# Patient Record
Sex: Female | Born: 1935 | Race: White | Hispanic: No | Smoking: Never smoker
Health system: Southern US, Community
[De-identification: ages and names within clinical notes are randomized; demographics above are authoritative.]

## PROBLEM LIST (undated history)

## (undated) DIAGNOSIS — R51 Headache: Secondary | ICD-10-CM

## (undated) DIAGNOSIS — E039 Hypothyroidism, unspecified: Secondary | ICD-10-CM

## (undated) DIAGNOSIS — R519 Headache, unspecified: Secondary | ICD-10-CM

## (undated) DIAGNOSIS — K219 Gastro-esophageal reflux disease without esophagitis: Secondary | ICD-10-CM

## (undated) DIAGNOSIS — J449 Chronic obstructive pulmonary disease, unspecified: Secondary | ICD-10-CM

## (undated) DIAGNOSIS — H9190 Unspecified hearing loss, unspecified ear: Secondary | ICD-10-CM

## (undated) DIAGNOSIS — F32A Depression, unspecified: Secondary | ICD-10-CM

## (undated) DIAGNOSIS — R1310 Dysphagia, unspecified: Secondary | ICD-10-CM

## (undated) DIAGNOSIS — IMO0001 Reserved for inherently not codable concepts without codable children: Secondary | ICD-10-CM

## (undated) DIAGNOSIS — I10 Essential (primary) hypertension: Secondary | ICD-10-CM

## (undated) DIAGNOSIS — F329 Major depressive disorder, single episode, unspecified: Secondary | ICD-10-CM

## (undated) DIAGNOSIS — H269 Unspecified cataract: Secondary | ICD-10-CM

## (undated) HISTORY — DX: Headache, unspecified: R51.9

## (undated) HISTORY — PX: APPENDECTOMY: SHX54

## (undated) HISTORY — DX: Gastro-esophageal reflux disease without esophagitis: K21.9

## (undated) HISTORY — DX: Unspecified hearing loss, unspecified ear: H91.90

## (undated) HISTORY — PX: LUMBAR LAMINECTOMY: SHX95

## (undated) HISTORY — DX: Dysphagia, unspecified: R13.10

## (undated) HISTORY — DX: Chronic obstructive pulmonary disease, unspecified: J44.9

## (undated) HISTORY — DX: Major depressive disorder, single episode, unspecified: F32.9

## (undated) HISTORY — DX: Reserved for inherently not codable concepts without codable children: IMO0001

## (undated) HISTORY — PX: PARATHYROIDECTOMY: SHX19

## (undated) HISTORY — PX: TOTAL HIP ARTHROPLASTY: SHX124

## (undated) HISTORY — DX: Depression, unspecified: F32.A

## (undated) HISTORY — DX: Headache: R51

## (undated) HISTORY — PX: ABDOMINAL HYSTERECTOMY: SUR658

## (undated) HISTORY — DX: Essential (primary) hypertension: I10

## (undated) HISTORY — DX: Unspecified cataract: H26.9

## (undated) HISTORY — DX: Hypothyroidism, unspecified: E03.9

---

## 2006-04-16 ENCOUNTER — Ambulatory Visit: Payer: Self-pay | Admitting: Pain Medicine

## 2006-04-23 ENCOUNTER — Ambulatory Visit: Payer: Self-pay | Admitting: Pain Medicine

## 2006-05-12 ENCOUNTER — Ambulatory Visit: Payer: Self-pay | Admitting: Pain Medicine

## 2006-06-12 ENCOUNTER — Ambulatory Visit: Payer: Self-pay | Admitting: Pain Medicine

## 2006-06-23 ENCOUNTER — Ambulatory Visit: Payer: Self-pay | Admitting: Pain Medicine

## 2006-08-05 ENCOUNTER — Ambulatory Visit: Payer: Self-pay | Admitting: Pain Medicine

## 2006-08-11 ENCOUNTER — Ambulatory Visit: Payer: Self-pay | Admitting: Pain Medicine

## 2006-09-17 ENCOUNTER — Encounter: Payer: Self-pay | Admitting: Neurology

## 2006-10-02 ENCOUNTER — Ambulatory Visit: Payer: Self-pay | Admitting: Pain Medicine

## 2006-10-08 ENCOUNTER — Encounter: Payer: Self-pay | Admitting: Neurology

## 2006-10-13 ENCOUNTER — Ambulatory Visit: Payer: Self-pay | Admitting: Pain Medicine

## 2006-11-25 ENCOUNTER — Ambulatory Visit: Payer: Self-pay | Admitting: Pain Medicine

## 2006-12-01 ENCOUNTER — Ambulatory Visit: Payer: Self-pay | Admitting: Pain Medicine

## 2006-12-25 ENCOUNTER — Ambulatory Visit: Payer: Self-pay | Admitting: Pain Medicine

## 2007-05-11 ENCOUNTER — Ambulatory Visit: Payer: Self-pay | Admitting: Pain Medicine

## 2007-06-23 ENCOUNTER — Ambulatory Visit: Payer: Self-pay | Admitting: Pain Medicine

## 2007-07-20 ENCOUNTER — Ambulatory Visit: Payer: Self-pay | Admitting: Pain Medicine

## 2007-08-20 ENCOUNTER — Ambulatory Visit: Payer: Self-pay | Admitting: Pain Medicine

## 2007-08-26 ENCOUNTER — Ambulatory Visit: Payer: Self-pay | Admitting: Pain Medicine

## 2007-09-16 ENCOUNTER — Ambulatory Visit: Payer: Self-pay | Admitting: Pain Medicine

## 2007-10-13 ENCOUNTER — Ambulatory Visit: Payer: Self-pay | Admitting: Pain Medicine

## 2007-11-19 ENCOUNTER — Ambulatory Visit: Payer: Self-pay | Admitting: Pain Medicine

## 2008-01-21 ENCOUNTER — Ambulatory Visit: Payer: Self-pay | Admitting: Pain Medicine

## 2008-02-11 ENCOUNTER — Ambulatory Visit: Payer: Self-pay | Admitting: Pain Medicine

## 2008-05-11 ENCOUNTER — Ambulatory Visit: Payer: Self-pay | Admitting: Pain Medicine

## 2008-06-14 ENCOUNTER — Ambulatory Visit: Payer: Self-pay | Admitting: Pain Medicine

## 2008-06-29 ENCOUNTER — Ambulatory Visit: Payer: Self-pay | Admitting: Pain Medicine

## 2008-07-27 ENCOUNTER — Ambulatory Visit: Payer: Self-pay | Admitting: Pain Medicine

## 2008-08-08 ENCOUNTER — Ambulatory Visit: Payer: Self-pay | Admitting: Pain Medicine

## 2008-09-15 ENCOUNTER — Ambulatory Visit: Payer: Self-pay | Admitting: Pain Medicine

## 2008-11-23 ENCOUNTER — Encounter: Payer: Self-pay | Admitting: Otolaryngology

## 2008-11-25 ENCOUNTER — Ambulatory Visit: Payer: Self-pay | Admitting: Otolaryngology

## 2008-12-07 ENCOUNTER — Encounter: Payer: Self-pay | Admitting: Otolaryngology

## 2008-12-12 ENCOUNTER — Ambulatory Visit: Payer: Self-pay | Admitting: Pain Medicine

## 2008-12-13 ENCOUNTER — Ambulatory Visit: Payer: Self-pay | Admitting: Family Medicine

## 2008-12-16 ENCOUNTER — Other Ambulatory Visit: Payer: Self-pay | Admitting: Psychiatry

## 2009-01-10 ENCOUNTER — Ambulatory Visit: Payer: Self-pay | Admitting: Pain Medicine

## 2009-01-11 ENCOUNTER — Encounter: Payer: Self-pay | Admitting: Otolaryngology

## 2009-01-23 ENCOUNTER — Ambulatory Visit: Payer: Self-pay | Admitting: Pain Medicine

## 2009-02-09 ENCOUNTER — Ambulatory Visit: Payer: Self-pay | Admitting: Ophthalmology

## 2009-02-21 ENCOUNTER — Encounter: Payer: Self-pay | Admitting: Otolaryngology

## 2009-02-23 ENCOUNTER — Ambulatory Visit: Payer: Self-pay | Admitting: Pain Medicine

## 2009-02-23 ENCOUNTER — Emergency Department: Payer: Self-pay | Admitting: Emergency Medicine

## 2009-03-14 ENCOUNTER — Encounter: Payer: Self-pay | Admitting: Otolaryngology

## 2009-03-23 ENCOUNTER — Ambulatory Visit: Payer: Self-pay | Admitting: Pain Medicine

## 2009-03-29 ENCOUNTER — Ambulatory Visit: Payer: Self-pay | Admitting: Pain Medicine

## 2009-04-04 ENCOUNTER — Ambulatory Visit: Payer: Self-pay | Admitting: Ophthalmology

## 2009-04-10 ENCOUNTER — Encounter: Payer: Self-pay | Admitting: Otolaryngology

## 2009-04-18 ENCOUNTER — Ambulatory Visit: Payer: Self-pay | Admitting: Pain Medicine

## 2009-04-20 ENCOUNTER — Ambulatory Visit: Payer: Self-pay | Admitting: Family Medicine

## 2009-05-24 ENCOUNTER — Ambulatory Visit: Payer: Self-pay | Admitting: Pain Medicine

## 2009-06-13 ENCOUNTER — Ambulatory Visit: Payer: Self-pay | Admitting: Gastroenterology

## 2009-06-14 ENCOUNTER — Ambulatory Visit: Payer: Self-pay | Admitting: Pain Medicine

## 2009-08-08 ENCOUNTER — Ambulatory Visit: Payer: Self-pay | Admitting: Pain Medicine

## 2009-08-14 ENCOUNTER — Ambulatory Visit: Payer: Self-pay | Admitting: Pain Medicine

## 2009-09-14 ENCOUNTER — Ambulatory Visit: Payer: Self-pay | Admitting: Pain Medicine

## 2009-09-27 ENCOUNTER — Ambulatory Visit: Payer: Self-pay | Admitting: Pain Medicine

## 2009-10-25 ENCOUNTER — Ambulatory Visit: Payer: Self-pay | Admitting: Family Medicine

## 2009-10-31 ENCOUNTER — Ambulatory Visit: Payer: Self-pay | Admitting: Pain Medicine

## 2009-11-08 ENCOUNTER — Ambulatory Visit: Payer: Self-pay | Admitting: Pain Medicine

## 2009-12-12 ENCOUNTER — Ambulatory Visit: Payer: Self-pay | Admitting: Pain Medicine

## 2010-01-03 ENCOUNTER — Ambulatory Visit: Payer: Self-pay | Admitting: Pain Medicine

## 2010-01-15 ENCOUNTER — Ambulatory Visit: Payer: Self-pay | Admitting: Pain Medicine

## 2010-02-15 ENCOUNTER — Ambulatory Visit: Payer: Self-pay | Admitting: Pain Medicine

## 2010-02-26 ENCOUNTER — Ambulatory Visit: Payer: Self-pay | Admitting: Pain Medicine

## 2010-03-13 ENCOUNTER — Ambulatory Visit: Payer: Self-pay | Admitting: Ophthalmology

## 2010-03-29 ENCOUNTER — Ambulatory Visit: Payer: Self-pay | Admitting: Pain Medicine

## 2010-04-11 ENCOUNTER — Ambulatory Visit: Payer: Self-pay | Admitting: Pain Medicine

## 2010-04-26 ENCOUNTER — Ambulatory Visit: Payer: Self-pay | Admitting: Pain Medicine

## 2010-05-07 ENCOUNTER — Ambulatory Visit: Payer: Self-pay | Admitting: Pain Medicine

## 2010-05-24 ENCOUNTER — Ambulatory Visit: Payer: Self-pay | Admitting: Pain Medicine

## 2010-05-28 ENCOUNTER — Ambulatory Visit: Payer: Self-pay | Admitting: Pain Medicine

## 2010-06-06 ENCOUNTER — Emergency Department: Payer: Self-pay | Admitting: Emergency Medicine

## 2010-06-07 ENCOUNTER — Ambulatory Visit: Payer: Self-pay | Admitting: Pain Medicine

## 2010-06-07 ENCOUNTER — Emergency Department: Payer: Self-pay | Admitting: Internal Medicine

## 2010-06-12 ENCOUNTER — Ambulatory Visit: Payer: Self-pay | Admitting: Unknown Physician Specialty

## 2010-06-18 ENCOUNTER — Ambulatory Visit: Payer: Self-pay | Admitting: Pain Medicine

## 2010-07-19 ENCOUNTER — Ambulatory Visit: Payer: Self-pay | Admitting: Pain Medicine

## 2010-07-25 ENCOUNTER — Ambulatory Visit: Payer: Self-pay | Admitting: Pain Medicine

## 2010-08-16 ENCOUNTER — Ambulatory Visit: Payer: Self-pay | Admitting: Pain Medicine

## 2010-08-20 DIAGNOSIS — C44310 Basal cell carcinoma of skin of unspecified parts of face: Secondary | ICD-10-CM | POA: Insufficient documentation

## 2010-08-27 ENCOUNTER — Ambulatory Visit: Payer: Self-pay | Admitting: Pain Medicine

## 2010-09-11 ENCOUNTER — Ambulatory Visit: Payer: Self-pay | Admitting: Unknown Physician Specialty

## 2010-09-13 ENCOUNTER — Ambulatory Visit: Payer: Self-pay | Admitting: Unknown Physician Specialty

## 2010-09-24 ENCOUNTER — Ambulatory Visit: Payer: Self-pay | Admitting: Surgery

## 2010-09-27 ENCOUNTER — Emergency Department: Payer: Self-pay | Admitting: Emergency Medicine

## 2010-10-03 ENCOUNTER — Ambulatory Visit: Payer: Self-pay | Admitting: Pain Medicine

## 2010-10-16 ENCOUNTER — Ambulatory Visit: Payer: Self-pay | Admitting: Pain Medicine

## 2010-10-24 ENCOUNTER — Ambulatory Visit: Payer: Self-pay | Admitting: Pain Medicine

## 2010-11-22 ENCOUNTER — Ambulatory Visit: Payer: Self-pay | Admitting: Pain Medicine

## 2010-12-05 ENCOUNTER — Ambulatory Visit: Payer: Self-pay | Admitting: Pain Medicine

## 2010-12-25 ENCOUNTER — Ambulatory Visit: Payer: Self-pay | Admitting: Pain Medicine

## 2011-01-10 DIAGNOSIS — M545 Low back pain, unspecified: Secondary | ICD-10-CM | POA: Diagnosis not present

## 2011-01-10 DIAGNOSIS — M6281 Muscle weakness (generalized): Secondary | ICD-10-CM | POA: Diagnosis not present

## 2011-01-10 DIAGNOSIS — R262 Difficulty in walking, not elsewhere classified: Secondary | ICD-10-CM | POA: Diagnosis not present

## 2011-01-15 DIAGNOSIS — M545 Low back pain, unspecified: Secondary | ICD-10-CM | POA: Diagnosis not present

## 2011-01-15 DIAGNOSIS — M6281 Muscle weakness (generalized): Secondary | ICD-10-CM | POA: Diagnosis not present

## 2011-01-15 DIAGNOSIS — R262 Difficulty in walking, not elsewhere classified: Secondary | ICD-10-CM | POA: Diagnosis not present

## 2011-01-16 ENCOUNTER — Ambulatory Visit: Payer: Self-pay | Admitting: Pain Medicine

## 2011-01-16 DIAGNOSIS — M4120 Other idiopathic scoliosis, site unspecified: Secondary | ICD-10-CM | POA: Diagnosis not present

## 2011-01-16 DIAGNOSIS — M62838 Other muscle spasm: Secondary | ICD-10-CM | POA: Diagnosis not present

## 2011-01-16 DIAGNOSIS — M5137 Other intervertebral disc degeneration, lumbosacral region: Secondary | ICD-10-CM | POA: Diagnosis not present

## 2011-01-16 DIAGNOSIS — Z79899 Other long term (current) drug therapy: Secondary | ICD-10-CM | POA: Diagnosis not present

## 2011-01-16 DIAGNOSIS — G5700 Lesion of sciatic nerve, unspecified lower limb: Secondary | ICD-10-CM | POA: Diagnosis not present

## 2011-01-16 DIAGNOSIS — IMO0002 Reserved for concepts with insufficient information to code with codable children: Secondary | ICD-10-CM | POA: Diagnosis not present

## 2011-01-16 DIAGNOSIS — Z9889 Other specified postprocedural states: Secondary | ICD-10-CM | POA: Diagnosis not present

## 2011-01-16 DIAGNOSIS — M4800 Spinal stenosis, site unspecified: Secondary | ICD-10-CM | POA: Diagnosis not present

## 2011-01-16 DIAGNOSIS — M79609 Pain in unspecified limb: Secondary | ICD-10-CM | POA: Diagnosis not present

## 2011-01-16 DIAGNOSIS — F329 Major depressive disorder, single episode, unspecified: Secondary | ICD-10-CM | POA: Diagnosis not present

## 2011-01-16 DIAGNOSIS — S32009A Unspecified fracture of unspecified lumbar vertebra, initial encounter for closed fracture: Secondary | ICD-10-CM | POA: Diagnosis not present

## 2011-01-16 DIAGNOSIS — M545 Low back pain, unspecified: Secondary | ICD-10-CM | POA: Diagnosis not present

## 2011-01-16 DIAGNOSIS — E039 Hypothyroidism, unspecified: Secondary | ICD-10-CM | POA: Diagnosis not present

## 2011-01-17 DIAGNOSIS — M6281 Muscle weakness (generalized): Secondary | ICD-10-CM | POA: Diagnosis not present

## 2011-01-17 DIAGNOSIS — R262 Difficulty in walking, not elsewhere classified: Secondary | ICD-10-CM | POA: Diagnosis not present

## 2011-01-17 DIAGNOSIS — M545 Low back pain, unspecified: Secondary | ICD-10-CM | POA: Diagnosis not present

## 2011-01-29 ENCOUNTER — Ambulatory Visit: Payer: Self-pay | Admitting: Family Medicine

## 2011-01-29 DIAGNOSIS — Z111 Encounter for screening for respiratory tuberculosis: Secondary | ICD-10-CM | POA: Diagnosis not present

## 2011-01-29 DIAGNOSIS — R059 Cough, unspecified: Secondary | ICD-10-CM | POA: Diagnosis not present

## 2011-01-29 DIAGNOSIS — S41109A Unspecified open wound of unspecified upper arm, initial encounter: Secondary | ICD-10-CM | POA: Diagnosis not present

## 2011-01-29 DIAGNOSIS — Q791 Other congenital malformations of diaphragm: Secondary | ICD-10-CM | POA: Diagnosis not present

## 2011-01-29 DIAGNOSIS — R05 Cough: Secondary | ICD-10-CM | POA: Diagnosis not present

## 2011-02-21 ENCOUNTER — Ambulatory Visit: Payer: Self-pay | Admitting: Pain Medicine

## 2011-02-21 DIAGNOSIS — M62838 Other muscle spasm: Secondary | ICD-10-CM | POA: Diagnosis not present

## 2011-02-21 DIAGNOSIS — Z9889 Other specified postprocedural states: Secondary | ICD-10-CM | POA: Diagnosis not present

## 2011-02-21 DIAGNOSIS — M539 Dorsopathy, unspecified: Secondary | ICD-10-CM | POA: Diagnosis not present

## 2011-02-21 DIAGNOSIS — M5380 Other specified dorsopathies, site unspecified: Secondary | ICD-10-CM | POA: Diagnosis not present

## 2011-02-21 DIAGNOSIS — I10 Essential (primary) hypertension: Secondary | ICD-10-CM | POA: Diagnosis not present

## 2011-02-21 DIAGNOSIS — F329 Major depressive disorder, single episode, unspecified: Secondary | ICD-10-CM | POA: Diagnosis not present

## 2011-02-21 DIAGNOSIS — Z79899 Other long term (current) drug therapy: Secondary | ICD-10-CM | POA: Diagnosis not present

## 2011-02-21 DIAGNOSIS — M5137 Other intervertebral disc degeneration, lumbosacral region: Secondary | ICD-10-CM | POA: Diagnosis not present

## 2011-02-21 DIAGNOSIS — E039 Hypothyroidism, unspecified: Secondary | ICD-10-CM | POA: Diagnosis not present

## 2011-02-21 DIAGNOSIS — IMO0002 Reserved for concepts with insufficient information to code with codable children: Secondary | ICD-10-CM | POA: Diagnosis not present

## 2011-03-04 ENCOUNTER — Ambulatory Visit: Payer: Self-pay | Admitting: Pain Medicine

## 2011-03-04 DIAGNOSIS — I10 Essential (primary) hypertension: Secondary | ICD-10-CM | POA: Diagnosis not present

## 2011-03-04 DIAGNOSIS — M545 Low back pain, unspecified: Secondary | ICD-10-CM | POA: Diagnosis not present

## 2011-03-04 DIAGNOSIS — M129 Arthropathy, unspecified: Secondary | ICD-10-CM | POA: Diagnosis not present

## 2011-03-04 DIAGNOSIS — Z79899 Other long term (current) drug therapy: Secondary | ICD-10-CM | POA: Diagnosis not present

## 2011-03-04 DIAGNOSIS — M5430 Sciatica, unspecified side: Secondary | ICD-10-CM | POA: Diagnosis not present

## 2011-03-04 DIAGNOSIS — G5700 Lesion of sciatic nerve, unspecified lower limb: Secondary | ICD-10-CM | POA: Diagnosis not present

## 2011-03-04 DIAGNOSIS — M539 Dorsopathy, unspecified: Secondary | ICD-10-CM | POA: Diagnosis not present

## 2011-03-04 DIAGNOSIS — IMO0002 Reserved for concepts with insufficient information to code with codable children: Secondary | ICD-10-CM | POA: Diagnosis not present

## 2011-03-04 DIAGNOSIS — M79609 Pain in unspecified limb: Secondary | ICD-10-CM | POA: Diagnosis not present

## 2011-03-04 DIAGNOSIS — F331 Major depressive disorder, recurrent, moderate: Secondary | ICD-10-CM | POA: Diagnosis not present

## 2011-03-04 DIAGNOSIS — F329 Major depressive disorder, single episode, unspecified: Secondary | ICD-10-CM | POA: Diagnosis not present

## 2011-03-04 DIAGNOSIS — F4001 Agoraphobia with panic disorder: Secondary | ICD-10-CM | POA: Diagnosis not present

## 2011-03-04 DIAGNOSIS — E039 Hypothyroidism, unspecified: Secondary | ICD-10-CM | POA: Diagnosis not present

## 2011-03-04 DIAGNOSIS — Z9889 Other specified postprocedural states: Secondary | ICD-10-CM | POA: Diagnosis not present

## 2011-03-04 DIAGNOSIS — F068 Other specified mental disorders due to known physiological condition: Secondary | ICD-10-CM | POA: Diagnosis not present

## 2011-03-26 ENCOUNTER — Ambulatory Visit: Payer: Self-pay | Admitting: Pain Medicine

## 2011-03-26 DIAGNOSIS — Z79899 Other long term (current) drug therapy: Secondary | ICD-10-CM | POA: Diagnosis not present

## 2011-03-26 DIAGNOSIS — Z9889 Other specified postprocedural states: Secondary | ICD-10-CM | POA: Diagnosis not present

## 2011-03-26 DIAGNOSIS — M545 Low back pain, unspecified: Secondary | ICD-10-CM | POA: Diagnosis not present

## 2011-03-26 DIAGNOSIS — M79609 Pain in unspecified limb: Secondary | ICD-10-CM | POA: Diagnosis not present

## 2011-03-26 DIAGNOSIS — IMO0002 Reserved for concepts with insufficient information to code with codable children: Secondary | ICD-10-CM | POA: Diagnosis not present

## 2011-03-26 DIAGNOSIS — M539 Dorsopathy, unspecified: Secondary | ICD-10-CM | POA: Diagnosis not present

## 2011-03-26 DIAGNOSIS — M129 Arthropathy, unspecified: Secondary | ICD-10-CM | POA: Diagnosis not present

## 2011-03-26 DIAGNOSIS — I10 Essential (primary) hypertension: Secondary | ICD-10-CM | POA: Diagnosis not present

## 2011-03-26 DIAGNOSIS — E039 Hypothyroidism, unspecified: Secondary | ICD-10-CM | POA: Diagnosis not present

## 2011-03-26 DIAGNOSIS — F329 Major depressive disorder, single episode, unspecified: Secondary | ICD-10-CM | POA: Diagnosis not present

## 2011-04-03 ENCOUNTER — Ambulatory Visit: Payer: Self-pay | Admitting: Pain Medicine

## 2011-04-03 DIAGNOSIS — M5430 Sciatica, unspecified side: Secondary | ICD-10-CM | POA: Diagnosis not present

## 2011-04-03 DIAGNOSIS — M47817 Spondylosis without myelopathy or radiculopathy, lumbosacral region: Secondary | ICD-10-CM | POA: Diagnosis not present

## 2011-04-03 DIAGNOSIS — F329 Major depressive disorder, single episode, unspecified: Secondary | ICD-10-CM | POA: Diagnosis not present

## 2011-04-03 DIAGNOSIS — M539 Dorsopathy, unspecified: Secondary | ICD-10-CM | POA: Diagnosis not present

## 2011-04-03 DIAGNOSIS — I10 Essential (primary) hypertension: Secondary | ICD-10-CM | POA: Diagnosis not present

## 2011-04-03 DIAGNOSIS — Z9889 Other specified postprocedural states: Secondary | ICD-10-CM | POA: Diagnosis not present

## 2011-04-03 DIAGNOSIS — M5380 Other specified dorsopathies, site unspecified: Secondary | ICD-10-CM | POA: Diagnosis not present

## 2011-04-03 DIAGNOSIS — M545 Low back pain, unspecified: Secondary | ICD-10-CM | POA: Diagnosis not present

## 2011-04-03 DIAGNOSIS — M79609 Pain in unspecified limb: Secondary | ICD-10-CM | POA: Diagnosis not present

## 2011-04-03 DIAGNOSIS — E039 Hypothyroidism, unspecified: Secondary | ICD-10-CM | POA: Diagnosis not present

## 2011-05-06 ENCOUNTER — Ambulatory Visit: Payer: Self-pay | Admitting: Pain Medicine

## 2011-05-06 DIAGNOSIS — M4716 Other spondylosis with myelopathy, lumbar region: Secondary | ICD-10-CM | POA: Diagnosis not present

## 2011-05-06 DIAGNOSIS — Z9889 Other specified postprocedural states: Secondary | ICD-10-CM | POA: Diagnosis not present

## 2011-05-06 DIAGNOSIS — IMO0002 Reserved for concepts with insufficient information to code with codable children: Secondary | ICD-10-CM | POA: Diagnosis not present

## 2011-05-06 DIAGNOSIS — F4001 Agoraphobia with panic disorder: Secondary | ICD-10-CM | POA: Diagnosis not present

## 2011-05-06 DIAGNOSIS — Z79899 Other long term (current) drug therapy: Secondary | ICD-10-CM | POA: Diagnosis not present

## 2011-05-06 DIAGNOSIS — M461 Sacroiliitis, not elsewhere classified: Secondary | ICD-10-CM | POA: Diagnosis not present

## 2011-05-06 DIAGNOSIS — F331 Major depressive disorder, recurrent, moderate: Secondary | ICD-10-CM | POA: Diagnosis not present

## 2011-05-06 DIAGNOSIS — F068 Other specified mental disorders due to known physiological condition: Secondary | ICD-10-CM | POA: Diagnosis not present

## 2011-05-21 DIAGNOSIS — F331 Major depressive disorder, recurrent, moderate: Secondary | ICD-10-CM | POA: Diagnosis not present

## 2011-05-28 DIAGNOSIS — F331 Major depressive disorder, recurrent, moderate: Secondary | ICD-10-CM | POA: Diagnosis not present

## 2011-06-11 ENCOUNTER — Ambulatory Visit: Payer: Self-pay | Admitting: Pain Medicine

## 2011-06-11 DIAGNOSIS — M79609 Pain in unspecified limb: Secondary | ICD-10-CM | POA: Diagnosis not present

## 2011-06-11 DIAGNOSIS — Z79899 Other long term (current) drug therapy: Secondary | ICD-10-CM | POA: Diagnosis not present

## 2011-06-11 DIAGNOSIS — Z9889 Other specified postprocedural states: Secondary | ICD-10-CM | POA: Diagnosis not present

## 2011-06-11 DIAGNOSIS — M545 Low back pain, unspecified: Secondary | ICD-10-CM | POA: Diagnosis not present

## 2011-06-11 DIAGNOSIS — M47817 Spondylosis without myelopathy or radiculopathy, lumbosacral region: Secondary | ICD-10-CM | POA: Diagnosis not present

## 2011-06-11 DIAGNOSIS — IMO0002 Reserved for concepts with insufficient information to code with codable children: Secondary | ICD-10-CM | POA: Diagnosis not present

## 2011-06-21 DIAGNOSIS — R109 Unspecified abdominal pain: Secondary | ICD-10-CM | POA: Diagnosis not present

## 2011-06-21 DIAGNOSIS — R1310 Dysphagia, unspecified: Secondary | ICD-10-CM | POA: Diagnosis not present

## 2011-06-24 ENCOUNTER — Ambulatory Visit: Payer: Self-pay | Admitting: Pain Medicine

## 2011-06-24 DIAGNOSIS — M461 Sacroiliitis, not elsewhere classified: Secondary | ICD-10-CM | POA: Diagnosis not present

## 2011-06-24 DIAGNOSIS — M79609 Pain in unspecified limb: Secondary | ICD-10-CM | POA: Diagnosis not present

## 2011-06-24 DIAGNOSIS — IMO0001 Reserved for inherently not codable concepts without codable children: Secondary | ICD-10-CM | POA: Diagnosis not present

## 2011-06-24 DIAGNOSIS — Z9889 Other specified postprocedural states: Secondary | ICD-10-CM | POA: Diagnosis not present

## 2011-06-24 DIAGNOSIS — IMO0002 Reserved for concepts with insufficient information to code with codable children: Secondary | ICD-10-CM | POA: Diagnosis not present

## 2011-06-24 DIAGNOSIS — M5106 Intervertebral disc disorders with myelopathy, lumbar region: Secondary | ICD-10-CM | POA: Diagnosis not present

## 2011-06-24 DIAGNOSIS — M4716 Other spondylosis with myelopathy, lumbar region: Secondary | ICD-10-CM | POA: Diagnosis not present

## 2011-06-24 DIAGNOSIS — M545 Low back pain, unspecified: Secondary | ICD-10-CM | POA: Diagnosis not present

## 2011-07-02 DIAGNOSIS — F331 Major depressive disorder, recurrent, moderate: Secondary | ICD-10-CM | POA: Diagnosis not present

## 2011-07-15 ENCOUNTER — Ambulatory Visit: Payer: Self-pay | Admitting: Gastroenterology

## 2011-07-15 DIAGNOSIS — K5900 Constipation, unspecified: Secondary | ICD-10-CM | POA: Diagnosis not present

## 2011-07-15 DIAGNOSIS — K449 Diaphragmatic hernia without obstruction or gangrene: Secondary | ICD-10-CM | POA: Diagnosis not present

## 2011-07-15 DIAGNOSIS — K222 Esophageal obstruction: Secondary | ICD-10-CM | POA: Diagnosis not present

## 2011-07-15 DIAGNOSIS — Z91041 Radiographic dye allergy status: Secondary | ICD-10-CM | POA: Diagnosis not present

## 2011-07-15 DIAGNOSIS — E039 Hypothyroidism, unspecified: Secondary | ICD-10-CM | POA: Diagnosis not present

## 2011-07-15 DIAGNOSIS — Q409 Congenital malformation of upper alimentary tract, unspecified: Secondary | ICD-10-CM | POA: Diagnosis not present

## 2011-07-15 DIAGNOSIS — Z882 Allergy status to sulfonamides status: Secondary | ICD-10-CM | POA: Diagnosis not present

## 2011-07-15 DIAGNOSIS — R1310 Dysphagia, unspecified: Secondary | ICD-10-CM | POA: Diagnosis not present

## 2011-07-15 DIAGNOSIS — R1013 Epigastric pain: Secondary | ICD-10-CM | POA: Diagnosis not present

## 2011-07-15 DIAGNOSIS — Z79899 Other long term (current) drug therapy: Secondary | ICD-10-CM | POA: Diagnosis not present

## 2011-07-15 DIAGNOSIS — Z880 Allergy status to penicillin: Secondary | ICD-10-CM | POA: Diagnosis not present

## 2011-07-15 DIAGNOSIS — I10 Essential (primary) hypertension: Secondary | ICD-10-CM | POA: Diagnosis not present

## 2011-07-15 DIAGNOSIS — D131 Benign neoplasm of stomach: Secondary | ICD-10-CM | POA: Diagnosis not present

## 2011-07-15 DIAGNOSIS — Z791 Long term (current) use of non-steroidal anti-inflammatories (NSAID): Secondary | ICD-10-CM | POA: Diagnosis not present

## 2011-07-15 DIAGNOSIS — Z91040 Latex allergy status: Secondary | ICD-10-CM | POA: Diagnosis not present

## 2011-07-16 DIAGNOSIS — F331 Major depressive disorder, recurrent, moderate: Secondary | ICD-10-CM | POA: Diagnosis not present

## 2011-07-16 DIAGNOSIS — F068 Other specified mental disorders due to known physiological condition: Secondary | ICD-10-CM | POA: Diagnosis not present

## 2011-07-16 DIAGNOSIS — F4001 Agoraphobia with panic disorder: Secondary | ICD-10-CM | POA: Diagnosis not present

## 2011-07-18 LAB — PATHOLOGY REPORT

## 2011-07-23 DIAGNOSIS — F331 Major depressive disorder, recurrent, moderate: Secondary | ICD-10-CM | POA: Diagnosis not present

## 2011-07-30 ENCOUNTER — Ambulatory Visit: Payer: Self-pay | Admitting: Pain Medicine

## 2011-07-30 DIAGNOSIS — M461 Sacroiliitis, not elsewhere classified: Secondary | ICD-10-CM | POA: Diagnosis not present

## 2011-07-30 DIAGNOSIS — M79609 Pain in unspecified limb: Secondary | ICD-10-CM | POA: Diagnosis not present

## 2011-07-30 DIAGNOSIS — Z79899 Other long term (current) drug therapy: Secondary | ICD-10-CM | POA: Diagnosis not present

## 2011-07-30 DIAGNOSIS — G5700 Lesion of sciatic nerve, unspecified lower limb: Secondary | ICD-10-CM | POA: Diagnosis not present

## 2011-07-30 DIAGNOSIS — M545 Low back pain, unspecified: Secondary | ICD-10-CM | POA: Diagnosis not present

## 2011-07-30 DIAGNOSIS — M546 Pain in thoracic spine: Secondary | ICD-10-CM | POA: Diagnosis not present

## 2011-07-30 DIAGNOSIS — IMO0002 Reserved for concepts with insufficient information to code with codable children: Secondary | ICD-10-CM | POA: Diagnosis not present

## 2011-07-30 DIAGNOSIS — M47817 Spondylosis without myelopathy or radiculopathy, lumbosacral region: Secondary | ICD-10-CM | POA: Diagnosis not present

## 2011-07-31 DIAGNOSIS — Z01419 Encounter for gynecological examination (general) (routine) without abnormal findings: Secondary | ICD-10-CM | POA: Diagnosis not present

## 2011-07-31 DIAGNOSIS — Z1231 Encounter for screening mammogram for malignant neoplasm of breast: Secondary | ICD-10-CM | POA: Diagnosis not present

## 2011-08-06 DIAGNOSIS — M47817 Spondylosis without myelopathy or radiculopathy, lumbosacral region: Secondary | ICD-10-CM | POA: Diagnosis not present

## 2011-08-07 ENCOUNTER — Ambulatory Visit: Payer: Self-pay | Admitting: Pain Medicine

## 2011-08-07 DIAGNOSIS — M79609 Pain in unspecified limb: Secondary | ICD-10-CM | POA: Diagnosis not present

## 2011-08-07 DIAGNOSIS — M47817 Spondylosis without myelopathy or radiculopathy, lumbosacral region: Secondary | ICD-10-CM | POA: Diagnosis not present

## 2011-08-07 DIAGNOSIS — IMO0002 Reserved for concepts with insufficient information to code with codable children: Secondary | ICD-10-CM | POA: Diagnosis not present

## 2011-08-07 DIAGNOSIS — M546 Pain in thoracic spine: Secondary | ICD-10-CM | POA: Diagnosis not present

## 2011-08-07 DIAGNOSIS — M545 Low back pain, unspecified: Secondary | ICD-10-CM | POA: Diagnosis not present

## 2011-08-07 DIAGNOSIS — M461 Sacroiliitis, not elsewhere classified: Secondary | ICD-10-CM | POA: Diagnosis not present

## 2011-09-16 ENCOUNTER — Ambulatory Visit: Payer: Self-pay | Admitting: Pain Medicine

## 2011-09-16 DIAGNOSIS — M545 Low back pain, unspecified: Secondary | ICD-10-CM | POA: Diagnosis not present

## 2011-09-16 DIAGNOSIS — IMO0002 Reserved for concepts with insufficient information to code with codable children: Secondary | ICD-10-CM | POA: Diagnosis not present

## 2011-09-16 DIAGNOSIS — M5430 Sciatica, unspecified side: Secondary | ICD-10-CM | POA: Diagnosis not present

## 2011-09-16 DIAGNOSIS — M62838 Other muscle spasm: Secondary | ICD-10-CM | POA: Diagnosis not present

## 2011-09-16 DIAGNOSIS — M79609 Pain in unspecified limb: Secondary | ICD-10-CM | POA: Diagnosis not present

## 2011-09-17 DIAGNOSIS — F4001 Agoraphobia with panic disorder: Secondary | ICD-10-CM | POA: Diagnosis not present

## 2011-09-17 DIAGNOSIS — F068 Other specified mental disorders due to known physiological condition: Secondary | ICD-10-CM | POA: Diagnosis not present

## 2011-09-17 DIAGNOSIS — F331 Major depressive disorder, recurrent, moderate: Secondary | ICD-10-CM | POA: Diagnosis not present

## 2011-09-24 DIAGNOSIS — L600 Ingrowing nail: Secondary | ICD-10-CM | POA: Diagnosis not present

## 2011-10-01 DIAGNOSIS — Z111 Encounter for screening for respiratory tuberculosis: Secondary | ICD-10-CM | POA: Diagnosis not present

## 2011-10-01 DIAGNOSIS — R1901 Right upper quadrant abdominal swelling, mass and lump: Secondary | ICD-10-CM | POA: Diagnosis not present

## 2011-10-01 DIAGNOSIS — K219 Gastro-esophageal reflux disease without esophagitis: Secondary | ICD-10-CM | POA: Diagnosis not present

## 2011-10-02 ENCOUNTER — Ambulatory Visit: Payer: Self-pay | Admitting: Family Medicine

## 2011-10-02 DIAGNOSIS — R1900 Intra-abdominal and pelvic swelling, mass and lump, unspecified site: Secondary | ICD-10-CM | POA: Diagnosis not present

## 2011-10-08 ENCOUNTER — Ambulatory Visit: Payer: Self-pay | Admitting: Pain Medicine

## 2011-10-08 DIAGNOSIS — M961 Postlaminectomy syndrome, not elsewhere classified: Secondary | ICD-10-CM | POA: Diagnosis not present

## 2011-10-08 DIAGNOSIS — IMO0001 Reserved for inherently not codable concepts without codable children: Secondary | ICD-10-CM | POA: Diagnosis not present

## 2011-10-08 DIAGNOSIS — IMO0002 Reserved for concepts with insufficient information to code with codable children: Secondary | ICD-10-CM | POA: Diagnosis not present

## 2011-10-08 DIAGNOSIS — M5137 Other intervertebral disc degeneration, lumbosacral region: Secondary | ICD-10-CM | POA: Diagnosis not present

## 2011-10-08 DIAGNOSIS — M461 Sacroiliitis, not elsewhere classified: Secondary | ICD-10-CM | POA: Diagnosis not present

## 2011-10-08 DIAGNOSIS — M545 Low back pain, unspecified: Secondary | ICD-10-CM | POA: Diagnosis not present

## 2011-10-08 DIAGNOSIS — G5700 Lesion of sciatic nerve, unspecified lower limb: Secondary | ICD-10-CM | POA: Diagnosis not present

## 2011-10-08 DIAGNOSIS — M79609 Pain in unspecified limb: Secondary | ICD-10-CM | POA: Diagnosis not present

## 2011-10-08 DIAGNOSIS — M47817 Spondylosis without myelopathy or radiculopathy, lumbosacral region: Secondary | ICD-10-CM | POA: Diagnosis not present

## 2011-10-16 ENCOUNTER — Ambulatory Visit: Payer: Self-pay | Admitting: Pain Medicine

## 2011-10-16 DIAGNOSIS — M545 Low back pain, unspecified: Secondary | ICD-10-CM | POA: Diagnosis not present

## 2011-10-16 DIAGNOSIS — M4716 Other spondylosis with myelopathy, lumbar region: Secondary | ICD-10-CM | POA: Diagnosis not present

## 2011-10-16 DIAGNOSIS — IMO0001 Reserved for inherently not codable concepts without codable children: Secondary | ICD-10-CM | POA: Diagnosis not present

## 2011-10-16 DIAGNOSIS — M79609 Pain in unspecified limb: Secondary | ICD-10-CM | POA: Diagnosis not present

## 2011-10-16 DIAGNOSIS — M461 Sacroiliitis, not elsewhere classified: Secondary | ICD-10-CM | POA: Diagnosis not present

## 2011-10-16 DIAGNOSIS — M5430 Sciatica, unspecified side: Secondary | ICD-10-CM | POA: Diagnosis not present

## 2011-10-16 DIAGNOSIS — M76899 Other specified enthesopathies of unspecified lower limb, excluding foot: Secondary | ICD-10-CM | POA: Diagnosis not present

## 2011-10-23 DIAGNOSIS — R198 Other specified symptoms and signs involving the digestive system and abdomen: Secondary | ICD-10-CM | POA: Diagnosis not present

## 2011-10-23 DIAGNOSIS — R3911 Hesitancy of micturition: Secondary | ICD-10-CM | POA: Diagnosis not present

## 2011-10-23 DIAGNOSIS — Z111 Encounter for screening for respiratory tuberculosis: Secondary | ICD-10-CM | POA: Diagnosis not present

## 2011-11-05 DIAGNOSIS — R3911 Hesitancy of micturition: Secondary | ICD-10-CM | POA: Diagnosis not present

## 2011-11-05 DIAGNOSIS — Z111 Encounter for screening for respiratory tuberculosis: Secondary | ICD-10-CM | POA: Diagnosis not present

## 2011-11-05 DIAGNOSIS — J209 Acute bronchitis, unspecified: Secondary | ICD-10-CM | POA: Diagnosis not present

## 2011-11-07 DIAGNOSIS — B079 Viral wart, unspecified: Secondary | ICD-10-CM | POA: Diagnosis not present

## 2011-11-07 DIAGNOSIS — L719 Rosacea, unspecified: Secondary | ICD-10-CM | POA: Diagnosis not present

## 2011-11-07 DIAGNOSIS — D485 Neoplasm of uncertain behavior of skin: Secondary | ICD-10-CM | POA: Diagnosis not present

## 2011-11-07 DIAGNOSIS — L821 Other seborrheic keratosis: Secondary | ICD-10-CM | POA: Diagnosis not present

## 2011-11-07 DIAGNOSIS — L819 Disorder of pigmentation, unspecified: Secondary | ICD-10-CM | POA: Diagnosis not present

## 2011-11-11 ENCOUNTER — Ambulatory Visit: Payer: Self-pay | Admitting: Pain Medicine

## 2011-11-11 DIAGNOSIS — M461 Sacroiliitis, not elsewhere classified: Secondary | ICD-10-CM | POA: Diagnosis not present

## 2011-11-11 DIAGNOSIS — M79609 Pain in unspecified limb: Secondary | ICD-10-CM | POA: Diagnosis not present

## 2011-11-11 DIAGNOSIS — M545 Low back pain, unspecified: Secondary | ICD-10-CM | POA: Diagnosis not present

## 2011-11-11 DIAGNOSIS — M5430 Sciatica, unspecified side: Secondary | ICD-10-CM | POA: Diagnosis not present

## 2011-11-11 DIAGNOSIS — M62838 Other muscle spasm: Secondary | ICD-10-CM | POA: Diagnosis not present

## 2011-11-11 DIAGNOSIS — IMO0002 Reserved for concepts with insufficient information to code with codable children: Secondary | ICD-10-CM | POA: Diagnosis not present

## 2011-11-11 DIAGNOSIS — Z96649 Presence of unspecified artificial hip joint: Secondary | ICD-10-CM | POA: Diagnosis not present

## 2011-12-10 ENCOUNTER — Ambulatory Visit: Payer: Self-pay | Admitting: Pain Medicine

## 2011-12-10 DIAGNOSIS — M461 Sacroiliitis, not elsewhere classified: Secondary | ICD-10-CM | POA: Diagnosis not present

## 2011-12-10 DIAGNOSIS — M961 Postlaminectomy syndrome, not elsewhere classified: Secondary | ICD-10-CM | POA: Diagnosis not present

## 2011-12-10 DIAGNOSIS — Z9889 Other specified postprocedural states: Secondary | ICD-10-CM | POA: Diagnosis not present

## 2011-12-10 DIAGNOSIS — M47817 Spondylosis without myelopathy or radiculopathy, lumbosacral region: Secondary | ICD-10-CM | POA: Diagnosis not present

## 2011-12-10 DIAGNOSIS — IMO0002 Reserved for concepts with insufficient information to code with codable children: Secondary | ICD-10-CM | POA: Diagnosis not present

## 2011-12-10 DIAGNOSIS — M7989 Other specified soft tissue disorders: Secondary | ICD-10-CM | POA: Diagnosis not present

## 2011-12-10 DIAGNOSIS — G5700 Lesion of sciatic nerve, unspecified lower limb: Secondary | ICD-10-CM | POA: Diagnosis not present

## 2011-12-12 ENCOUNTER — Ambulatory Visit: Payer: Self-pay | Admitting: Pain Medicine

## 2011-12-12 DIAGNOSIS — M7989 Other specified soft tissue disorders: Secondary | ICD-10-CM | POA: Diagnosis not present

## 2011-12-12 DIAGNOSIS — M79609 Pain in unspecified limb: Secondary | ICD-10-CM | POA: Diagnosis not present

## 2011-12-17 ENCOUNTER — Ambulatory Visit: Payer: Self-pay | Admitting: Pain Medicine

## 2011-12-17 DIAGNOSIS — M7989 Other specified soft tissue disorders: Secondary | ICD-10-CM | POA: Diagnosis not present

## 2011-12-17 DIAGNOSIS — M79609 Pain in unspecified limb: Secondary | ICD-10-CM | POA: Diagnosis not present

## 2011-12-20 DIAGNOSIS — L0390 Cellulitis, unspecified: Secondary | ICD-10-CM | POA: Diagnosis not present

## 2011-12-20 DIAGNOSIS — R3911 Hesitancy of micturition: Secondary | ICD-10-CM | POA: Diagnosis not present

## 2011-12-20 DIAGNOSIS — Z111 Encounter for screening for respiratory tuberculosis: Secondary | ICD-10-CM | POA: Diagnosis not present

## 2011-12-20 DIAGNOSIS — L0291 Cutaneous abscess, unspecified: Secondary | ICD-10-CM | POA: Diagnosis not present

## 2011-12-25 DIAGNOSIS — M545 Low back pain, unspecified: Secondary | ICD-10-CM | POA: Diagnosis not present

## 2011-12-25 DIAGNOSIS — M7750 Other enthesopathy of unspecified foot: Secondary | ICD-10-CM | POA: Diagnosis not present

## 2011-12-27 ENCOUNTER — Ambulatory Visit: Payer: Self-pay | Admitting: Family Medicine

## 2011-12-27 DIAGNOSIS — R609 Edema, unspecified: Secondary | ICD-10-CM | POA: Diagnosis not present

## 2011-12-27 DIAGNOSIS — L0390 Cellulitis, unspecified: Secondary | ICD-10-CM | POA: Diagnosis not present

## 2011-12-27 DIAGNOSIS — R062 Wheezing: Secondary | ICD-10-CM | POA: Diagnosis not present

## 2011-12-27 DIAGNOSIS — R918 Other nonspecific abnormal finding of lung field: Secondary | ICD-10-CM | POA: Diagnosis not present

## 2011-12-27 DIAGNOSIS — L0291 Cutaneous abscess, unspecified: Secondary | ICD-10-CM | POA: Diagnosis not present

## 2012-01-03 DIAGNOSIS — R609 Edema, unspecified: Secondary | ICD-10-CM | POA: Diagnosis not present

## 2012-01-03 DIAGNOSIS — I10 Essential (primary) hypertension: Secondary | ICD-10-CM | POA: Diagnosis not present

## 2012-01-03 DIAGNOSIS — R0602 Shortness of breath: Secondary | ICD-10-CM | POA: Diagnosis not present

## 2012-01-06 DIAGNOSIS — R0602 Shortness of breath: Secondary | ICD-10-CM | POA: Diagnosis not present

## 2012-01-06 DIAGNOSIS — E039 Hypothyroidism, unspecified: Secondary | ICD-10-CM | POA: Diagnosis not present

## 2012-01-17 DIAGNOSIS — I890 Lymphedema, not elsewhere classified: Secondary | ICD-10-CM | POA: Diagnosis not present

## 2012-01-17 DIAGNOSIS — R5383 Other fatigue: Secondary | ICD-10-CM | POA: Diagnosis not present

## 2012-01-17 DIAGNOSIS — R609 Edema, unspecified: Secondary | ICD-10-CM | POA: Diagnosis not present

## 2012-01-17 DIAGNOSIS — R5381 Other malaise: Secondary | ICD-10-CM | POA: Diagnosis not present

## 2012-01-21 ENCOUNTER — Ambulatory Visit: Payer: Self-pay | Admitting: Pain Medicine

## 2012-01-21 DIAGNOSIS — M48061 Spinal stenosis, lumbar region without neurogenic claudication: Secondary | ICD-10-CM | POA: Diagnosis not present

## 2012-01-21 DIAGNOSIS — M545 Low back pain, unspecified: Secondary | ICD-10-CM | POA: Diagnosis not present

## 2012-01-21 DIAGNOSIS — G5700 Lesion of sciatic nerve, unspecified lower limb: Secondary | ICD-10-CM | POA: Diagnosis not present

## 2012-01-21 DIAGNOSIS — M7989 Other specified soft tissue disorders: Secondary | ICD-10-CM | POA: Diagnosis not present

## 2012-01-21 DIAGNOSIS — IMO0002 Reserved for concepts with insufficient information to code with codable children: Secondary | ICD-10-CM | POA: Diagnosis not present

## 2012-01-21 DIAGNOSIS — Z9889 Other specified postprocedural states: Secondary | ICD-10-CM | POA: Diagnosis not present

## 2012-01-21 DIAGNOSIS — M7120 Synovial cyst of popliteal space [Baker], unspecified knee: Secondary | ICD-10-CM | POA: Diagnosis not present

## 2012-01-21 DIAGNOSIS — M461 Sacroiliitis, not elsewhere classified: Secondary | ICD-10-CM | POA: Diagnosis not present

## 2012-01-21 DIAGNOSIS — M79609 Pain in unspecified limb: Secondary | ICD-10-CM | POA: Diagnosis not present

## 2012-01-21 DIAGNOSIS — M47817 Spondylosis without myelopathy or radiculopathy, lumbosacral region: Secondary | ICD-10-CM | POA: Diagnosis not present

## 2012-01-23 ENCOUNTER — Ambulatory Visit: Payer: Self-pay | Admitting: Family Medicine

## 2012-01-23 DIAGNOSIS — M79609 Pain in unspecified limb: Secondary | ICD-10-CM | POA: Diagnosis not present

## 2012-01-23 DIAGNOSIS — R609 Edema, unspecified: Secondary | ICD-10-CM | POA: Diagnosis not present

## 2012-01-23 DIAGNOSIS — M7989 Other specified soft tissue disorders: Secondary | ICD-10-CM | POA: Diagnosis not present

## 2012-02-03 DIAGNOSIS — E785 Hyperlipidemia, unspecified: Secondary | ICD-10-CM | POA: Diagnosis not present

## 2012-02-03 DIAGNOSIS — R5381 Other malaise: Secondary | ICD-10-CM | POA: Diagnosis not present

## 2012-02-14 DIAGNOSIS — E785 Hyperlipidemia, unspecified: Secondary | ICD-10-CM | POA: Diagnosis not present

## 2012-02-14 DIAGNOSIS — R609 Edema, unspecified: Secondary | ICD-10-CM | POA: Diagnosis not present

## 2012-02-14 DIAGNOSIS — J069 Acute upper respiratory infection, unspecified: Secondary | ICD-10-CM | POA: Diagnosis not present

## 2012-02-25 ENCOUNTER — Ambulatory Visit: Payer: Self-pay | Admitting: Pain Medicine

## 2012-02-25 DIAGNOSIS — G5700 Lesion of sciatic nerve, unspecified lower limb: Secondary | ICD-10-CM | POA: Diagnosis not present

## 2012-02-25 DIAGNOSIS — IMO0002 Reserved for concepts with insufficient information to code with codable children: Secondary | ICD-10-CM | POA: Diagnosis not present

## 2012-02-25 DIAGNOSIS — M62838 Other muscle spasm: Secondary | ICD-10-CM | POA: Diagnosis not present

## 2012-02-25 DIAGNOSIS — M961 Postlaminectomy syndrome, not elsewhere classified: Secondary | ICD-10-CM | POA: Diagnosis not present

## 2012-02-25 DIAGNOSIS — M461 Sacroiliitis, not elsewhere classified: Secondary | ICD-10-CM | POA: Diagnosis not present

## 2012-02-25 DIAGNOSIS — M47817 Spondylosis without myelopathy or radiculopathy, lumbosacral region: Secondary | ICD-10-CM | POA: Diagnosis not present

## 2012-02-25 DIAGNOSIS — M5137 Other intervertebral disc degeneration, lumbosacral region: Secondary | ICD-10-CM | POA: Diagnosis not present

## 2012-02-26 DIAGNOSIS — M5137 Other intervertebral disc degeneration, lumbosacral region: Secondary | ICD-10-CM | POA: Diagnosis not present

## 2012-02-26 DIAGNOSIS — M62838 Other muscle spasm: Secondary | ICD-10-CM | POA: Diagnosis not present

## 2012-02-26 DIAGNOSIS — R609 Edema, unspecified: Secondary | ICD-10-CM | POA: Diagnosis not present

## 2012-02-27 DIAGNOSIS — E785 Hyperlipidemia, unspecified: Secondary | ICD-10-CM | POA: Diagnosis not present

## 2012-03-03 DIAGNOSIS — M5137 Other intervertebral disc degeneration, lumbosacral region: Secondary | ICD-10-CM | POA: Diagnosis not present

## 2012-03-03 DIAGNOSIS — R609 Edema, unspecified: Secondary | ICD-10-CM | POA: Diagnosis not present

## 2012-03-03 DIAGNOSIS — M62838 Other muscle spasm: Secondary | ICD-10-CM | POA: Diagnosis not present

## 2012-03-10 DIAGNOSIS — R609 Edema, unspecified: Secondary | ICD-10-CM | POA: Diagnosis not present

## 2012-03-10 DIAGNOSIS — M5137 Other intervertebral disc degeneration, lumbosacral region: Secondary | ICD-10-CM | POA: Diagnosis not present

## 2012-03-10 DIAGNOSIS — M62838 Other muscle spasm: Secondary | ICD-10-CM | POA: Diagnosis not present

## 2012-03-16 DIAGNOSIS — M62838 Other muscle spasm: Secondary | ICD-10-CM | POA: Diagnosis not present

## 2012-03-16 DIAGNOSIS — M5137 Other intervertebral disc degeneration, lumbosacral region: Secondary | ICD-10-CM | POA: Diagnosis not present

## 2012-03-19 DIAGNOSIS — B351 Tinea unguium: Secondary | ICD-10-CM | POA: Diagnosis not present

## 2012-03-19 DIAGNOSIS — M79609 Pain in unspecified limb: Secondary | ICD-10-CM | POA: Diagnosis not present

## 2012-03-24 DIAGNOSIS — M62838 Other muscle spasm: Secondary | ICD-10-CM | POA: Diagnosis not present

## 2012-03-24 DIAGNOSIS — R609 Edema, unspecified: Secondary | ICD-10-CM | POA: Diagnosis not present

## 2012-03-24 DIAGNOSIS — M5137 Other intervertebral disc degeneration, lumbosacral region: Secondary | ICD-10-CM | POA: Diagnosis not present

## 2012-03-30 DIAGNOSIS — M5137 Other intervertebral disc degeneration, lumbosacral region: Secondary | ICD-10-CM | POA: Diagnosis not present

## 2012-03-30 DIAGNOSIS — R609 Edema, unspecified: Secondary | ICD-10-CM | POA: Diagnosis not present

## 2012-03-30 DIAGNOSIS — M62838 Other muscle spasm: Secondary | ICD-10-CM | POA: Diagnosis not present

## 2012-04-06 DIAGNOSIS — M62838 Other muscle spasm: Secondary | ICD-10-CM | POA: Diagnosis not present

## 2012-04-06 DIAGNOSIS — M5137 Other intervertebral disc degeneration, lumbosacral region: Secondary | ICD-10-CM | POA: Diagnosis not present

## 2012-04-06 DIAGNOSIS — R609 Edema, unspecified: Secondary | ICD-10-CM | POA: Diagnosis not present

## 2012-04-08 DIAGNOSIS — M5137 Other intervertebral disc degeneration, lumbosacral region: Secondary | ICD-10-CM | POA: Diagnosis not present

## 2012-04-08 DIAGNOSIS — R609 Edema, unspecified: Secondary | ICD-10-CM | POA: Diagnosis not present

## 2012-04-08 DIAGNOSIS — M62838 Other muscle spasm: Secondary | ICD-10-CM | POA: Diagnosis not present

## 2012-04-13 DIAGNOSIS — R609 Edema, unspecified: Secondary | ICD-10-CM | POA: Diagnosis not present

## 2012-04-13 DIAGNOSIS — M5137 Other intervertebral disc degeneration, lumbosacral region: Secondary | ICD-10-CM | POA: Diagnosis not present

## 2012-04-13 DIAGNOSIS — M62838 Other muscle spasm: Secondary | ICD-10-CM | POA: Diagnosis not present

## 2012-04-15 DIAGNOSIS — M62838 Other muscle spasm: Secondary | ICD-10-CM | POA: Diagnosis not present

## 2012-04-15 DIAGNOSIS — M5137 Other intervertebral disc degeneration, lumbosacral region: Secondary | ICD-10-CM | POA: Diagnosis not present

## 2012-04-15 DIAGNOSIS — R609 Edema, unspecified: Secondary | ICD-10-CM | POA: Diagnosis not present

## 2012-04-20 DIAGNOSIS — M62838 Other muscle spasm: Secondary | ICD-10-CM | POA: Diagnosis not present

## 2012-04-20 DIAGNOSIS — R609 Edema, unspecified: Secondary | ICD-10-CM | POA: Diagnosis not present

## 2012-04-20 DIAGNOSIS — M5137 Other intervertebral disc degeneration, lumbosacral region: Secondary | ICD-10-CM | POA: Diagnosis not present

## 2012-04-21 DIAGNOSIS — L820 Inflamed seborrheic keratosis: Secondary | ICD-10-CM | POA: Diagnosis not present

## 2012-04-21 DIAGNOSIS — L909 Atrophic disorder of skin, unspecified: Secondary | ICD-10-CM | POA: Diagnosis not present

## 2012-04-21 DIAGNOSIS — Z85828 Personal history of other malignant neoplasm of skin: Secondary | ICD-10-CM | POA: Diagnosis not present

## 2012-04-21 DIAGNOSIS — L259 Unspecified contact dermatitis, unspecified cause: Secondary | ICD-10-CM | POA: Diagnosis not present

## 2012-04-21 DIAGNOSIS — L738 Other specified follicular disorders: Secondary | ICD-10-CM | POA: Diagnosis not present

## 2012-04-21 DIAGNOSIS — L821 Other seborrheic keratosis: Secondary | ICD-10-CM | POA: Diagnosis not present

## 2012-04-22 DIAGNOSIS — M5137 Other intervertebral disc degeneration, lumbosacral region: Secondary | ICD-10-CM | POA: Diagnosis not present

## 2012-04-22 DIAGNOSIS — M62838 Other muscle spasm: Secondary | ICD-10-CM | POA: Diagnosis not present

## 2012-04-22 DIAGNOSIS — R609 Edema, unspecified: Secondary | ICD-10-CM | POA: Diagnosis not present

## 2012-04-27 DIAGNOSIS — M62838 Other muscle spasm: Secondary | ICD-10-CM | POA: Diagnosis not present

## 2012-04-27 DIAGNOSIS — M5137 Other intervertebral disc degeneration, lumbosacral region: Secondary | ICD-10-CM | POA: Diagnosis not present

## 2012-04-27 DIAGNOSIS — R609 Edema, unspecified: Secondary | ICD-10-CM | POA: Diagnosis not present

## 2012-04-29 DIAGNOSIS — M62838 Other muscle spasm: Secondary | ICD-10-CM | POA: Diagnosis not present

## 2012-04-29 DIAGNOSIS — R609 Edema, unspecified: Secondary | ICD-10-CM | POA: Diagnosis not present

## 2012-04-29 DIAGNOSIS — M5137 Other intervertebral disc degeneration, lumbosacral region: Secondary | ICD-10-CM | POA: Diagnosis not present

## 2012-05-06 DIAGNOSIS — R609 Edema, unspecified: Secondary | ICD-10-CM | POA: Diagnosis not present

## 2012-05-06 DIAGNOSIS — M62838 Other muscle spasm: Secondary | ICD-10-CM | POA: Diagnosis not present

## 2012-05-06 DIAGNOSIS — M5137 Other intervertebral disc degeneration, lumbosacral region: Secondary | ICD-10-CM | POA: Diagnosis not present

## 2012-05-08 DIAGNOSIS — J309 Allergic rhinitis, unspecified: Secondary | ICD-10-CM | POA: Diagnosis not present

## 2012-05-08 DIAGNOSIS — J449 Chronic obstructive pulmonary disease, unspecified: Secondary | ICD-10-CM | POA: Diagnosis not present

## 2012-05-08 DIAGNOSIS — R609 Edema, unspecified: Secondary | ICD-10-CM | POA: Diagnosis not present

## 2012-05-18 DIAGNOSIS — R609 Edema, unspecified: Secondary | ICD-10-CM | POA: Diagnosis not present

## 2012-05-18 DIAGNOSIS — M5137 Other intervertebral disc degeneration, lumbosacral region: Secondary | ICD-10-CM | POA: Diagnosis not present

## 2012-05-18 DIAGNOSIS — M62838 Other muscle spasm: Secondary | ICD-10-CM | POA: Diagnosis not present

## 2012-05-20 DIAGNOSIS — M5137 Other intervertebral disc degeneration, lumbosacral region: Secondary | ICD-10-CM | POA: Diagnosis not present

## 2012-05-20 DIAGNOSIS — M62838 Other muscle spasm: Secondary | ICD-10-CM | POA: Diagnosis not present

## 2012-05-20 DIAGNOSIS — R609 Edema, unspecified: Secondary | ICD-10-CM | POA: Diagnosis not present

## 2012-05-27 DIAGNOSIS — M5137 Other intervertebral disc degeneration, lumbosacral region: Secondary | ICD-10-CM | POA: Diagnosis not present

## 2012-05-27 DIAGNOSIS — M62838 Other muscle spasm: Secondary | ICD-10-CM | POA: Diagnosis not present

## 2012-05-27 DIAGNOSIS — R609 Edema, unspecified: Secondary | ICD-10-CM | POA: Diagnosis not present

## 2012-05-29 DIAGNOSIS — J449 Chronic obstructive pulmonary disease, unspecified: Secondary | ICD-10-CM | POA: Diagnosis not present

## 2012-05-29 DIAGNOSIS — E039 Hypothyroidism, unspecified: Secondary | ICD-10-CM | POA: Diagnosis not present

## 2012-05-29 DIAGNOSIS — J209 Acute bronchitis, unspecified: Secondary | ICD-10-CM | POA: Diagnosis not present

## 2012-05-29 DIAGNOSIS — K219 Gastro-esophageal reflux disease without esophagitis: Secondary | ICD-10-CM | POA: Diagnosis not present

## 2012-06-03 DIAGNOSIS — M62838 Other muscle spasm: Secondary | ICD-10-CM | POA: Diagnosis not present

## 2012-06-03 DIAGNOSIS — R609 Edema, unspecified: Secondary | ICD-10-CM | POA: Diagnosis not present

## 2012-06-03 DIAGNOSIS — M5137 Other intervertebral disc degeneration, lumbosacral region: Secondary | ICD-10-CM | POA: Diagnosis not present

## 2012-06-08 DIAGNOSIS — M5137 Other intervertebral disc degeneration, lumbosacral region: Secondary | ICD-10-CM | POA: Diagnosis not present

## 2012-06-08 DIAGNOSIS — M62838 Other muscle spasm: Secondary | ICD-10-CM | POA: Diagnosis not present

## 2012-06-08 DIAGNOSIS — R609 Edema, unspecified: Secondary | ICD-10-CM | POA: Diagnosis not present

## 2012-06-19 DIAGNOSIS — J209 Acute bronchitis, unspecified: Secondary | ICD-10-CM | POA: Diagnosis not present

## 2012-06-19 DIAGNOSIS — J449 Chronic obstructive pulmonary disease, unspecified: Secondary | ICD-10-CM | POA: Diagnosis not present

## 2012-06-19 DIAGNOSIS — H43819 Vitreous degeneration, unspecified eye: Secondary | ICD-10-CM | POA: Diagnosis not present

## 2012-06-19 DIAGNOSIS — K219 Gastro-esophageal reflux disease without esophagitis: Secondary | ICD-10-CM | POA: Diagnosis not present

## 2012-06-22 DIAGNOSIS — L909 Atrophic disorder of skin, unspecified: Secondary | ICD-10-CM | POA: Diagnosis not present

## 2012-06-22 DIAGNOSIS — L821 Other seborrheic keratosis: Secondary | ICD-10-CM | POA: Diagnosis not present

## 2012-06-22 DIAGNOSIS — L820 Inflamed seborrheic keratosis: Secondary | ICD-10-CM | POA: Diagnosis not present

## 2012-06-22 DIAGNOSIS — R21 Rash and other nonspecific skin eruption: Secondary | ICD-10-CM | POA: Diagnosis not present

## 2012-06-22 DIAGNOSIS — L280 Lichen simplex chronicus: Secondary | ICD-10-CM | POA: Diagnosis not present

## 2012-06-22 DIAGNOSIS — L919 Hypertrophic disorder of the skin, unspecified: Secondary | ICD-10-CM | POA: Diagnosis not present

## 2012-06-22 DIAGNOSIS — Z85828 Personal history of other malignant neoplasm of skin: Secondary | ICD-10-CM | POA: Diagnosis not present

## 2012-08-12 DIAGNOSIS — R609 Edema, unspecified: Secondary | ICD-10-CM | POA: Diagnosis not present

## 2012-08-12 DIAGNOSIS — M5137 Other intervertebral disc degeneration, lumbosacral region: Secondary | ICD-10-CM | POA: Diagnosis not present

## 2012-08-12 DIAGNOSIS — M62838 Other muscle spasm: Secondary | ICD-10-CM | POA: Diagnosis not present

## 2012-08-18 DIAGNOSIS — S239XXA Sprain of unspecified parts of thorax, initial encounter: Secondary | ICD-10-CM | POA: Diagnosis not present

## 2012-08-18 DIAGNOSIS — R609 Edema, unspecified: Secondary | ICD-10-CM | POA: Diagnosis not present

## 2012-08-18 DIAGNOSIS — M62838 Other muscle spasm: Secondary | ICD-10-CM | POA: Diagnosis not present

## 2012-08-19 ENCOUNTER — Ambulatory Visit: Payer: Self-pay | Admitting: Pain Medicine

## 2012-08-19 DIAGNOSIS — R079 Chest pain, unspecified: Secondary | ICD-10-CM | POA: Diagnosis not present

## 2012-08-19 DIAGNOSIS — R1084 Generalized abdominal pain: Secondary | ICD-10-CM | POA: Diagnosis not present

## 2012-08-19 DIAGNOSIS — M62838 Other muscle spasm: Secondary | ICD-10-CM | POA: Diagnosis not present

## 2012-08-19 DIAGNOSIS — R609 Edema, unspecified: Secondary | ICD-10-CM | POA: Diagnosis not present

## 2012-08-19 DIAGNOSIS — I517 Cardiomegaly: Secondary | ICD-10-CM | POA: Diagnosis not present

## 2012-08-19 DIAGNOSIS — M5137 Other intervertebral disc degeneration, lumbosacral region: Secondary | ICD-10-CM | POA: Diagnosis not present

## 2012-08-19 DIAGNOSIS — J90 Pleural effusion, not elsewhere classified: Secondary | ICD-10-CM | POA: Diagnosis not present

## 2012-08-24 DIAGNOSIS — L820 Inflamed seborrheic keratosis: Secondary | ICD-10-CM | POA: Diagnosis not present

## 2012-08-24 DIAGNOSIS — D485 Neoplasm of uncertain behavior of skin: Secondary | ICD-10-CM | POA: Diagnosis not present

## 2012-08-24 DIAGNOSIS — D692 Other nonthrombocytopenic purpura: Secondary | ICD-10-CM | POA: Diagnosis not present

## 2012-08-24 DIAGNOSIS — L821 Other seborrheic keratosis: Secondary | ICD-10-CM | POA: Diagnosis not present

## 2012-08-24 DIAGNOSIS — L280 Lichen simplex chronicus: Secondary | ICD-10-CM | POA: Diagnosis not present

## 2012-08-24 DIAGNOSIS — C44529 Squamous cell carcinoma of skin of other part of trunk: Secondary | ICD-10-CM | POA: Diagnosis not present

## 2012-08-25 DIAGNOSIS — R609 Edema, unspecified: Secondary | ICD-10-CM | POA: Diagnosis not present

## 2012-08-25 DIAGNOSIS — S239XXA Sprain of unspecified parts of thorax, initial encounter: Secondary | ICD-10-CM | POA: Diagnosis not present

## 2012-08-25 DIAGNOSIS — M62838 Other muscle spasm: Secondary | ICD-10-CM | POA: Diagnosis not present

## 2012-08-27 DIAGNOSIS — S239XXA Sprain of unspecified parts of thorax, initial encounter: Secondary | ICD-10-CM | POA: Diagnosis not present

## 2012-08-27 DIAGNOSIS — M62838 Other muscle spasm: Secondary | ICD-10-CM | POA: Diagnosis not present

## 2012-08-27 DIAGNOSIS — R609 Edema, unspecified: Secondary | ICD-10-CM | POA: Diagnosis not present

## 2012-08-28 DIAGNOSIS — J449 Chronic obstructive pulmonary disease, unspecified: Secondary | ICD-10-CM | POA: Diagnosis not present

## 2012-08-28 DIAGNOSIS — R609 Edema, unspecified: Secondary | ICD-10-CM | POA: Diagnosis not present

## 2012-08-28 DIAGNOSIS — F329 Major depressive disorder, single episode, unspecified: Secondary | ICD-10-CM | POA: Diagnosis not present

## 2012-08-28 DIAGNOSIS — R252 Cramp and spasm: Secondary | ICD-10-CM | POA: Diagnosis not present

## 2012-08-31 DIAGNOSIS — S239XXA Sprain of unspecified parts of thorax, initial encounter: Secondary | ICD-10-CM | POA: Diagnosis not present

## 2012-08-31 DIAGNOSIS — M62838 Other muscle spasm: Secondary | ICD-10-CM | POA: Diagnosis not present

## 2012-08-31 DIAGNOSIS — R609 Edema, unspecified: Secondary | ICD-10-CM | POA: Diagnosis not present

## 2012-09-02 DIAGNOSIS — S239XXA Sprain of unspecified parts of thorax, initial encounter: Secondary | ICD-10-CM | POA: Diagnosis not present

## 2012-09-02 DIAGNOSIS — M62838 Other muscle spasm: Secondary | ICD-10-CM | POA: Diagnosis not present

## 2012-09-02 DIAGNOSIS — R609 Edema, unspecified: Secondary | ICD-10-CM | POA: Diagnosis not present

## 2012-09-09 DIAGNOSIS — S239XXA Sprain of unspecified parts of thorax, initial encounter: Secondary | ICD-10-CM | POA: Diagnosis not present

## 2012-09-09 DIAGNOSIS — M62838 Other muscle spasm: Secondary | ICD-10-CM | POA: Diagnosis not present

## 2012-09-09 DIAGNOSIS — R609 Edema, unspecified: Secondary | ICD-10-CM | POA: Diagnosis not present

## 2012-09-14 DIAGNOSIS — S239XXA Sprain of unspecified parts of thorax, initial encounter: Secondary | ICD-10-CM | POA: Diagnosis not present

## 2012-09-14 DIAGNOSIS — R609 Edema, unspecified: Secondary | ICD-10-CM | POA: Diagnosis not present

## 2012-09-14 DIAGNOSIS — M62838 Other muscle spasm: Secondary | ICD-10-CM | POA: Diagnosis not present

## 2012-09-16 DIAGNOSIS — M79609 Pain in unspecified limb: Secondary | ICD-10-CM | POA: Diagnosis not present

## 2012-09-16 DIAGNOSIS — B351 Tinea unguium: Secondary | ICD-10-CM | POA: Diagnosis not present

## 2012-09-21 DIAGNOSIS — M62838 Other muscle spasm: Secondary | ICD-10-CM | POA: Diagnosis not present

## 2012-09-21 DIAGNOSIS — S239XXA Sprain of unspecified parts of thorax, initial encounter: Secondary | ICD-10-CM | POA: Diagnosis not present

## 2012-09-21 DIAGNOSIS — R609 Edema, unspecified: Secondary | ICD-10-CM | POA: Diagnosis not present

## 2012-09-22 DIAGNOSIS — R609 Edema, unspecified: Secondary | ICD-10-CM | POA: Diagnosis not present

## 2012-09-22 DIAGNOSIS — S239XXA Sprain of unspecified parts of thorax, initial encounter: Secondary | ICD-10-CM | POA: Diagnosis not present

## 2012-09-22 DIAGNOSIS — M62838 Other muscle spasm: Secondary | ICD-10-CM | POA: Diagnosis not present

## 2012-10-13 DIAGNOSIS — Z85828 Personal history of other malignant neoplasm of skin: Secondary | ICD-10-CM | POA: Diagnosis not present

## 2012-10-13 DIAGNOSIS — R21 Rash and other nonspecific skin eruption: Secondary | ICD-10-CM | POA: Diagnosis not present

## 2012-10-13 DIAGNOSIS — L280 Lichen simplex chronicus: Secondary | ICD-10-CM | POA: Diagnosis not present

## 2012-10-27 DIAGNOSIS — Z1231 Encounter for screening mammogram for malignant neoplasm of breast: Secondary | ICD-10-CM | POA: Diagnosis not present

## 2012-10-27 DIAGNOSIS — Z01419 Encounter for gynecological examination (general) (routine) without abnormal findings: Secondary | ICD-10-CM | POA: Diagnosis not present

## 2012-10-27 DIAGNOSIS — Z1211 Encounter for screening for malignant neoplasm of colon: Secondary | ICD-10-CM | POA: Diagnosis not present

## 2012-11-03 DIAGNOSIS — R21 Rash and other nonspecific skin eruption: Secondary | ICD-10-CM | POA: Diagnosis not present

## 2012-11-03 DIAGNOSIS — L981 Factitial dermatitis: Secondary | ICD-10-CM | POA: Diagnosis not present

## 2012-11-03 DIAGNOSIS — L280 Lichen simplex chronicus: Secondary | ICD-10-CM | POA: Diagnosis not present

## 2012-11-03 DIAGNOSIS — L821 Other seborrheic keratosis: Secondary | ICD-10-CM | POA: Diagnosis not present

## 2012-11-05 DIAGNOSIS — Z23 Encounter for immunization: Secondary | ICD-10-CM | POA: Diagnosis not present

## 2012-11-13 DIAGNOSIS — F330 Major depressive disorder, recurrent, mild: Secondary | ICD-10-CM | POA: Diagnosis not present

## 2012-12-08 DIAGNOSIS — L905 Scar conditions and fibrosis of skin: Secondary | ICD-10-CM | POA: Diagnosis not present

## 2012-12-08 DIAGNOSIS — L821 Other seborrheic keratosis: Secondary | ICD-10-CM | POA: Diagnosis not present

## 2012-12-08 DIAGNOSIS — L820 Inflamed seborrheic keratosis: Secondary | ICD-10-CM | POA: Diagnosis not present

## 2012-12-08 DIAGNOSIS — L280 Lichen simplex chronicus: Secondary | ICD-10-CM | POA: Diagnosis not present

## 2012-12-09 DIAGNOSIS — N3941 Urge incontinence: Secondary | ICD-10-CM | POA: Insufficient documentation

## 2012-12-09 DIAGNOSIS — R339 Retention of urine, unspecified: Secondary | ICD-10-CM | POA: Diagnosis not present

## 2012-12-09 DIAGNOSIS — N139 Obstructive and reflux uropathy, unspecified: Secondary | ICD-10-CM | POA: Insufficient documentation

## 2012-12-09 DIAGNOSIS — R350 Frequency of micturition: Secondary | ICD-10-CM | POA: Insufficient documentation

## 2012-12-11 DIAGNOSIS — K219 Gastro-esophageal reflux disease without esophagitis: Secondary | ICD-10-CM | POA: Diagnosis not present

## 2012-12-11 DIAGNOSIS — J449 Chronic obstructive pulmonary disease, unspecified: Secondary | ICD-10-CM | POA: Diagnosis not present

## 2012-12-11 DIAGNOSIS — R609 Edema, unspecified: Secondary | ICD-10-CM | POA: Diagnosis not present

## 2012-12-21 DIAGNOSIS — R350 Frequency of micturition: Secondary | ICD-10-CM | POA: Diagnosis not present

## 2013-01-13 DIAGNOSIS — L259 Unspecified contact dermatitis, unspecified cause: Secondary | ICD-10-CM | POA: Diagnosis not present

## 2013-02-04 DIAGNOSIS — R5381 Other malaise: Secondary | ICD-10-CM | POA: Diagnosis not present

## 2013-02-04 DIAGNOSIS — R609 Edema, unspecified: Secondary | ICD-10-CM | POA: Diagnosis not present

## 2013-02-04 DIAGNOSIS — J309 Allergic rhinitis, unspecified: Secondary | ICD-10-CM | POA: Diagnosis not present

## 2013-02-04 DIAGNOSIS — R0602 Shortness of breath: Secondary | ICD-10-CM | POA: Diagnosis not present

## 2013-02-05 DIAGNOSIS — F330 Major depressive disorder, recurrent, mild: Secondary | ICD-10-CM | POA: Diagnosis not present

## 2013-02-09 DIAGNOSIS — D509 Iron deficiency anemia, unspecified: Secondary | ICD-10-CM | POA: Diagnosis not present

## 2013-02-09 DIAGNOSIS — D649 Anemia, unspecified: Secondary | ICD-10-CM | POA: Diagnosis not present

## 2013-02-10 DIAGNOSIS — R279 Unspecified lack of coordination: Secondary | ICD-10-CM | POA: Diagnosis not present

## 2013-02-11 ENCOUNTER — Encounter: Payer: Self-pay | Admitting: Podiatry

## 2013-02-12 ENCOUNTER — Encounter: Payer: Self-pay | Admitting: Podiatry

## 2013-02-12 ENCOUNTER — Ambulatory Visit (INDEPENDENT_AMBULATORY_CARE_PROVIDER_SITE_OTHER): Payer: Medicare Other | Admitting: Podiatry

## 2013-02-12 VITALS — BP 128/60 | HR 101 | Resp 16 | Ht 60.0 in | Wt 142.0 lb

## 2013-02-12 DIAGNOSIS — M79609 Pain in unspecified limb: Secondary | ICD-10-CM | POA: Diagnosis not present

## 2013-02-12 DIAGNOSIS — B351 Tinea unguium: Secondary | ICD-10-CM

## 2013-02-12 NOTE — Progress Notes (Signed)
Nail trim

## 2013-02-12 NOTE — Progress Notes (Signed)
Subjective:     Patient ID: Sandra Brown, female   DOB: [birth year], 80 y.o.   MRN: [id number]  HPI patient presents stating I need to get my nails trimmed as they are elongated sore back and I cannot cut them myself   Review of Systems     Objective:   Physical Exam Neurovascular status unchanged with thick nail disease 1-5 both feet that are painful when pressed    Assessment:     Mycotic nail infection with pain 1-5 nails    Plan:     Debrided painful nailbeds 1-5 with no bleeding noted

## 2013-02-18 DIAGNOSIS — R609 Edema, unspecified: Secondary | ICD-10-CM | POA: Diagnosis not present

## 2013-02-18 DIAGNOSIS — D509 Iron deficiency anemia, unspecified: Secondary | ICD-10-CM | POA: Diagnosis not present

## 2013-02-18 DIAGNOSIS — J441 Chronic obstructive pulmonary disease with (acute) exacerbation: Secondary | ICD-10-CM | POA: Diagnosis not present

## 2013-02-24 DIAGNOSIS — R350 Frequency of micturition: Secondary | ICD-10-CM | POA: Diagnosis not present

## 2013-02-24 DIAGNOSIS — R339 Retention of urine, unspecified: Secondary | ICD-10-CM | POA: Diagnosis not present

## 2013-02-24 DIAGNOSIS — N139 Obstructive and reflux uropathy, unspecified: Secondary | ICD-10-CM | POA: Diagnosis not present

## 2013-02-24 DIAGNOSIS — N3941 Urge incontinence: Secondary | ICD-10-CM | POA: Diagnosis not present

## 2013-02-25 DIAGNOSIS — R279 Unspecified lack of coordination: Secondary | ICD-10-CM | POA: Diagnosis not present

## 2013-02-26 DIAGNOSIS — R279 Unspecified lack of coordination: Secondary | ICD-10-CM | POA: Diagnosis not present

## 2013-03-01 DIAGNOSIS — R279 Unspecified lack of coordination: Secondary | ICD-10-CM | POA: Diagnosis not present

## 2013-03-03 DIAGNOSIS — R279 Unspecified lack of coordination: Secondary | ICD-10-CM | POA: Diagnosis not present

## 2013-03-04 DIAGNOSIS — R279 Unspecified lack of coordination: Secondary | ICD-10-CM | POA: Diagnosis not present

## 2013-03-08 DIAGNOSIS — R279 Unspecified lack of coordination: Secondary | ICD-10-CM | POA: Diagnosis not present

## 2013-03-09 DIAGNOSIS — R279 Unspecified lack of coordination: Secondary | ICD-10-CM | POA: Diagnosis not present

## 2013-03-10 DIAGNOSIS — R279 Unspecified lack of coordination: Secondary | ICD-10-CM | POA: Diagnosis not present

## 2013-03-12 DIAGNOSIS — F330 Major depressive disorder, recurrent, mild: Secondary | ICD-10-CM | POA: Diagnosis not present

## 2013-03-12 DIAGNOSIS — R279 Unspecified lack of coordination: Secondary | ICD-10-CM | POA: Diagnosis not present

## 2013-03-15 DIAGNOSIS — Z961 Presence of intraocular lens: Secondary | ICD-10-CM | POA: Diagnosis not present

## 2013-03-19 DIAGNOSIS — R252 Cramp and spasm: Secondary | ICD-10-CM | POA: Diagnosis not present

## 2013-03-19 DIAGNOSIS — J449 Chronic obstructive pulmonary disease, unspecified: Secondary | ICD-10-CM | POA: Diagnosis not present

## 2013-03-19 DIAGNOSIS — M1990 Unspecified osteoarthritis, unspecified site: Secondary | ICD-10-CM | POA: Diagnosis not present

## 2013-04-12 DIAGNOSIS — J449 Chronic obstructive pulmonary disease, unspecified: Secondary | ICD-10-CM | POA: Diagnosis not present

## 2013-04-12 DIAGNOSIS — M1990 Unspecified osteoarthritis, unspecified site: Secondary | ICD-10-CM | POA: Diagnosis not present

## 2013-04-12 DIAGNOSIS — K117 Disturbances of salivary secretion: Secondary | ICD-10-CM | POA: Diagnosis not present

## 2013-05-07 ENCOUNTER — Ambulatory Visit (INDEPENDENT_AMBULATORY_CARE_PROVIDER_SITE_OTHER): Payer: Medicare Other | Admitting: Podiatry

## 2013-05-07 VITALS — Resp 16 | Ht 60.0 in | Wt 142.0 lb

## 2013-05-07 DIAGNOSIS — M79609 Pain in unspecified limb: Secondary | ICD-10-CM | POA: Diagnosis not present

## 2013-05-07 DIAGNOSIS — B351 Tinea unguium: Secondary | ICD-10-CM

## 2013-05-09 NOTE — Progress Notes (Signed)
Subjective:     Patient ID: Sandra Brown, female   DOB: 10-28-1926, 80 y.o.   MRN: [id number]  HPI patient presents with thick yellow nailbeds 1-5 both feet better impossible for her to cut   Review of Systems     Objective:   Physical Exam Neurovascular status unchanged with thick yellow brittle crumbled nailbeds 1-5 both feet that are painful    Assessment:     Mycotic nail infection to with pain 1-5 both feet    Plan:     Debridement painful nailbeds 1-5 both feet with no iatrogenic bleeding noted

## 2013-06-04 ENCOUNTER — Ambulatory Visit: Payer: Self-pay | Admitting: Pain Medicine

## 2013-06-04 DIAGNOSIS — Z9181 History of falling: Secondary | ICD-10-CM | POA: Diagnosis not present

## 2013-06-04 DIAGNOSIS — M5137 Other intervertebral disc degeneration, lumbosacral region: Secondary | ICD-10-CM | POA: Diagnosis not present

## 2013-06-04 DIAGNOSIS — M545 Low back pain, unspecified: Secondary | ICD-10-CM | POA: Diagnosis not present

## 2013-06-04 DIAGNOSIS — M4120 Other idiopathic scoliosis, site unspecified: Secondary | ICD-10-CM | POA: Diagnosis not present

## 2013-06-04 DIAGNOSIS — M47817 Spondylosis without myelopathy or radiculopathy, lumbosacral region: Secondary | ICD-10-CM | POA: Diagnosis not present

## 2013-06-08 ENCOUNTER — Ambulatory Visit: Payer: Self-pay | Admitting: Pain Medicine

## 2013-06-08 DIAGNOSIS — G5700 Lesion of sciatic nerve, unspecified lower limb: Secondary | ICD-10-CM | POA: Diagnosis not present

## 2013-06-08 DIAGNOSIS — IMO0002 Reserved for concepts with insufficient information to code with codable children: Secondary | ICD-10-CM | POA: Diagnosis not present

## 2013-06-08 DIAGNOSIS — M461 Sacroiliitis, not elsewhere classified: Secondary | ICD-10-CM | POA: Diagnosis not present

## 2013-06-08 DIAGNOSIS — M47817 Spondylosis without myelopathy or radiculopathy, lumbosacral region: Secondary | ICD-10-CM | POA: Diagnosis not present

## 2013-06-08 DIAGNOSIS — Z9181 History of falling: Secondary | ICD-10-CM | POA: Diagnosis not present

## 2013-06-08 DIAGNOSIS — M4120 Other idiopathic scoliosis, site unspecified: Secondary | ICD-10-CM | POA: Diagnosis not present

## 2013-06-08 DIAGNOSIS — M5137 Other intervertebral disc degeneration, lumbosacral region: Secondary | ICD-10-CM | POA: Diagnosis not present

## 2013-06-11 DIAGNOSIS — F330 Major depressive disorder, recurrent, mild: Secondary | ICD-10-CM | POA: Diagnosis not present

## 2013-06-23 ENCOUNTER — Ambulatory Visit: Payer: Self-pay | Admitting: Pain Medicine

## 2013-06-23 DIAGNOSIS — M5137 Other intervertebral disc degeneration, lumbosacral region: Secondary | ICD-10-CM | POA: Diagnosis not present

## 2013-06-23 DIAGNOSIS — M47817 Spondylosis without myelopathy or radiculopathy, lumbosacral region: Secondary | ICD-10-CM | POA: Diagnosis not present

## 2013-07-13 ENCOUNTER — Ambulatory Visit: Payer: Self-pay | Admitting: Pain Medicine

## 2013-07-13 DIAGNOSIS — M169 Osteoarthritis of hip, unspecified: Secondary | ICD-10-CM | POA: Diagnosis not present

## 2013-07-13 DIAGNOSIS — IMO0002 Reserved for concepts with insufficient information to code with codable children: Secondary | ICD-10-CM | POA: Diagnosis not present

## 2013-07-13 DIAGNOSIS — M62838 Other muscle spasm: Secondary | ICD-10-CM | POA: Diagnosis not present

## 2013-07-13 DIAGNOSIS — M47817 Spondylosis without myelopathy or radiculopathy, lumbosacral region: Secondary | ICD-10-CM | POA: Diagnosis not present

## 2013-07-13 DIAGNOSIS — M5137 Other intervertebral disc degeneration, lumbosacral region: Secondary | ICD-10-CM | POA: Diagnosis not present

## 2013-07-13 DIAGNOSIS — M461 Sacroiliitis, not elsewhere classified: Secondary | ICD-10-CM | POA: Diagnosis not present

## 2013-07-13 DIAGNOSIS — M1610 Unilateral primary osteoarthritis, unspecified hip: Secondary | ICD-10-CM | POA: Diagnosis not present

## 2013-07-30 DIAGNOSIS — R252 Cramp and spasm: Secondary | ICD-10-CM | POA: Diagnosis not present

## 2013-07-30 DIAGNOSIS — K117 Disturbances of salivary secretion: Secondary | ICD-10-CM | POA: Diagnosis not present

## 2013-07-30 DIAGNOSIS — J441 Chronic obstructive pulmonary disease with (acute) exacerbation: Secondary | ICD-10-CM | POA: Diagnosis not present

## 2013-08-04 DIAGNOSIS — R252 Cramp and spasm: Secondary | ICD-10-CM | POA: Diagnosis not present

## 2013-08-12 ENCOUNTER — Ambulatory Visit: Payer: Self-pay | Admitting: Pain Medicine

## 2013-08-12 DIAGNOSIS — M62838 Other muscle spasm: Secondary | ICD-10-CM | POA: Diagnosis not present

## 2013-08-12 DIAGNOSIS — M47817 Spondylosis without myelopathy or radiculopathy, lumbosacral region: Secondary | ICD-10-CM | POA: Diagnosis not present

## 2013-08-12 DIAGNOSIS — IMO0002 Reserved for concepts with insufficient information to code with codable children: Secondary | ICD-10-CM | POA: Diagnosis not present

## 2013-08-12 DIAGNOSIS — Z9889 Other specified postprocedural states: Secondary | ICD-10-CM | POA: Diagnosis not present

## 2013-08-12 DIAGNOSIS — M461 Sacroiliitis, not elsewhere classified: Secondary | ICD-10-CM | POA: Diagnosis not present

## 2013-09-08 ENCOUNTER — Ambulatory Visit (INDEPENDENT_AMBULATORY_CARE_PROVIDER_SITE_OTHER): Payer: Medicare Other | Admitting: Podiatry

## 2013-09-08 DIAGNOSIS — B351 Tinea unguium: Secondary | ICD-10-CM | POA: Diagnosis not present

## 2013-09-08 DIAGNOSIS — M79609 Pain in unspecified limb: Secondary | ICD-10-CM

## 2013-09-08 DIAGNOSIS — M79676 Pain in unspecified toe(s): Secondary | ICD-10-CM

## 2013-09-08 NOTE — Progress Notes (Signed)
She presents today with a chief complaint of painful elongated toenails one through 5 bilateral. ° °Objective: Nails are thick yellow dystrophic with mycotic and painful palpation. ° °Assessment: Pain in limb secondary to onychomycosis 1 through 5 bilateral. ° °Plan: Debridement of nails 1 through 5 bilateral. °

## 2013-09-28 ENCOUNTER — Ambulatory Visit: Payer: Self-pay | Admitting: Pain Medicine

## 2013-09-28 DIAGNOSIS — M62838 Other muscle spasm: Secondary | ICD-10-CM | POA: Diagnosis not present

## 2013-09-28 DIAGNOSIS — M47817 Spondylosis without myelopathy or radiculopathy, lumbosacral region: Secondary | ICD-10-CM | POA: Diagnosis not present

## 2013-09-28 DIAGNOSIS — M48061 Spinal stenosis, lumbar region without neurogenic claudication: Secondary | ICD-10-CM | POA: Diagnosis not present

## 2013-09-28 DIAGNOSIS — IMO0002 Reserved for concepts with insufficient information to code with codable children: Secondary | ICD-10-CM | POA: Diagnosis not present

## 2013-10-06 ENCOUNTER — Ambulatory Visit: Payer: Self-pay | Admitting: Pain Medicine

## 2013-10-06 DIAGNOSIS — M48061 Spinal stenosis, lumbar region without neurogenic claudication: Secondary | ICD-10-CM | POA: Diagnosis not present

## 2013-10-06 DIAGNOSIS — M47817 Spondylosis without myelopathy or radiculopathy, lumbosacral region: Secondary | ICD-10-CM | POA: Diagnosis not present

## 2013-10-06 DIAGNOSIS — M62838 Other muscle spasm: Secondary | ICD-10-CM | POA: Diagnosis not present

## 2013-10-06 DIAGNOSIS — IMO0002 Reserved for concepts with insufficient information to code with codable children: Secondary | ICD-10-CM | POA: Diagnosis not present

## 2013-10-11 DIAGNOSIS — J449 Chronic obstructive pulmonary disease, unspecified: Secondary | ICD-10-CM | POA: Diagnosis not present

## 2013-10-11 DIAGNOSIS — E039 Hypothyroidism, unspecified: Secondary | ICD-10-CM | POA: Diagnosis not present

## 2013-10-11 DIAGNOSIS — D509 Iron deficiency anemia, unspecified: Secondary | ICD-10-CM | POA: Diagnosis not present

## 2013-10-14 DIAGNOSIS — D509 Iron deficiency anemia, unspecified: Secondary | ICD-10-CM | POA: Diagnosis not present

## 2013-10-29 DIAGNOSIS — D509 Iron deficiency anemia, unspecified: Secondary | ICD-10-CM | POA: Diagnosis not present

## 2013-10-29 DIAGNOSIS — E039 Hypothyroidism, unspecified: Secondary | ICD-10-CM | POA: Diagnosis not present

## 2013-10-29 DIAGNOSIS — J441 Chronic obstructive pulmonary disease with (acute) exacerbation: Secondary | ICD-10-CM | POA: Diagnosis not present

## 2013-11-11 ENCOUNTER — Ambulatory Visit: Payer: Self-pay | Admitting: Pain Medicine

## 2013-11-11 DIAGNOSIS — M4806 Spinal stenosis, lumbar region: Secondary | ICD-10-CM | POA: Diagnosis not present

## 2013-11-11 DIAGNOSIS — M47817 Spondylosis without myelopathy or radiculopathy, lumbosacral region: Secondary | ICD-10-CM | POA: Diagnosis not present

## 2013-11-11 DIAGNOSIS — M791 Myalgia: Secondary | ICD-10-CM | POA: Diagnosis not present

## 2013-11-11 DIAGNOSIS — M461 Sacroiliitis, not elsewhere classified: Secondary | ICD-10-CM | POA: Diagnosis not present

## 2013-11-11 DIAGNOSIS — M5416 Radiculopathy, lumbar region: Secondary | ICD-10-CM | POA: Diagnosis not present

## 2013-11-15 ENCOUNTER — Ambulatory Visit: Payer: Self-pay | Admitting: Pain Medicine

## 2013-11-15 DIAGNOSIS — M169 Osteoarthritis of hip, unspecified: Secondary | ICD-10-CM | POA: Diagnosis not present

## 2013-11-15 DIAGNOSIS — M5137 Other intervertebral disc degeneration, lumbosacral region: Secondary | ICD-10-CM | POA: Diagnosis not present

## 2013-11-15 DIAGNOSIS — M4806 Spinal stenosis, lumbar region: Secondary | ICD-10-CM | POA: Diagnosis not present

## 2013-12-08 ENCOUNTER — Ambulatory Visit: Payer: Medicare Other | Admitting: Podiatry

## 2013-12-13 ENCOUNTER — Other Ambulatory Visit: Payer: Medicare Other

## 2013-12-14 ENCOUNTER — Ambulatory Visit: Payer: Self-pay | Admitting: Pain Medicine

## 2013-12-14 DIAGNOSIS — M4806 Spinal stenosis, lumbar region: Secondary | ICD-10-CM | POA: Diagnosis not present

## 2013-12-14 DIAGNOSIS — M5116 Intervertebral disc disorders with radiculopathy, lumbar region: Secondary | ICD-10-CM | POA: Diagnosis not present

## 2013-12-14 DIAGNOSIS — M5416 Radiculopathy, lumbar region: Secondary | ICD-10-CM | POA: Diagnosis not present

## 2013-12-14 DIAGNOSIS — M47896 Other spondylosis, lumbar region: Secondary | ICD-10-CM | POA: Diagnosis not present

## 2013-12-14 DIAGNOSIS — M169 Osteoarthritis of hip, unspecified: Secondary | ICD-10-CM | POA: Diagnosis not present

## 2013-12-14 DIAGNOSIS — M47817 Spondylosis without myelopathy or radiculopathy, lumbosacral region: Secondary | ICD-10-CM | POA: Diagnosis not present

## 2013-12-20 ENCOUNTER — Ambulatory Visit: Payer: Self-pay | Admitting: Pain Medicine

## 2013-12-20 DIAGNOSIS — M4806 Spinal stenosis, lumbar region: Secondary | ICD-10-CM | POA: Diagnosis not present

## 2013-12-20 DIAGNOSIS — M169 Osteoarthritis of hip, unspecified: Secondary | ICD-10-CM | POA: Diagnosis not present

## 2013-12-20 DIAGNOSIS — M47896 Other spondylosis, lumbar region: Secondary | ICD-10-CM | POA: Diagnosis not present

## 2014-01-04 DIAGNOSIS — J441 Chronic obstructive pulmonary disease with (acute) exacerbation: Secondary | ICD-10-CM | POA: Diagnosis not present

## 2014-01-04 DIAGNOSIS — K219 Gastro-esophageal reflux disease without esophagitis: Secondary | ICD-10-CM | POA: Diagnosis not present

## 2014-01-04 DIAGNOSIS — E039 Hypothyroidism, unspecified: Secondary | ICD-10-CM | POA: Diagnosis not present

## 2014-01-04 DIAGNOSIS — D509 Iron deficiency anemia, unspecified: Secondary | ICD-10-CM | POA: Diagnosis not present

## 2014-01-17 ENCOUNTER — Ambulatory Visit: Payer: Self-pay | Admitting: Pain Medicine

## 2014-01-17 DIAGNOSIS — M5416 Radiculopathy, lumbar region: Secondary | ICD-10-CM | POA: Diagnosis not present

## 2014-01-17 DIAGNOSIS — M79605 Pain in left leg: Secondary | ICD-10-CM | POA: Diagnosis not present

## 2014-01-17 DIAGNOSIS — M4806 Spinal stenosis, lumbar region: Secondary | ICD-10-CM | POA: Diagnosis not present

## 2014-02-15 ENCOUNTER — Ambulatory Visit: Payer: Self-pay | Admitting: Pain Medicine

## 2014-02-15 DIAGNOSIS — M25552 Pain in left hip: Secondary | ICD-10-CM | POA: Diagnosis not present

## 2014-02-15 DIAGNOSIS — I10 Essential (primary) hypertension: Secondary | ICD-10-CM | POA: Diagnosis not present

## 2014-02-15 DIAGNOSIS — E039 Hypothyroidism, unspecified: Secondary | ICD-10-CM | POA: Diagnosis not present

## 2014-02-15 DIAGNOSIS — M461 Sacroiliitis, not elsewhere classified: Secondary | ICD-10-CM | POA: Diagnosis not present

## 2014-02-15 DIAGNOSIS — M79605 Pain in left leg: Secondary | ICD-10-CM | POA: Diagnosis not present

## 2014-02-15 DIAGNOSIS — M47817 Spondylosis without myelopathy or radiculopathy, lumbosacral region: Secondary | ICD-10-CM | POA: Diagnosis not present

## 2014-02-15 DIAGNOSIS — F329 Major depressive disorder, single episode, unspecified: Secondary | ICD-10-CM | POA: Diagnosis not present

## 2014-02-15 DIAGNOSIS — M5416 Radiculopathy, lumbar region: Secondary | ICD-10-CM | POA: Diagnosis not present

## 2014-02-18 DIAGNOSIS — J441 Chronic obstructive pulmonary disease with (acute) exacerbation: Secondary | ICD-10-CM | POA: Diagnosis not present

## 2014-02-18 DIAGNOSIS — K219 Gastro-esophageal reflux disease without esophagitis: Secondary | ICD-10-CM | POA: Diagnosis not present

## 2014-02-18 DIAGNOSIS — R062 Wheezing: Secondary | ICD-10-CM | POA: Diagnosis not present

## 2014-02-18 DIAGNOSIS — M1990 Unspecified osteoarthritis, unspecified site: Secondary | ICD-10-CM | POA: Diagnosis not present

## 2014-03-02 ENCOUNTER — Ambulatory Visit: Payer: Self-pay | Admitting: Pain Medicine

## 2014-03-02 DIAGNOSIS — M545 Low back pain: Secondary | ICD-10-CM | POA: Diagnosis not present

## 2014-03-02 DIAGNOSIS — M4806 Spinal stenosis, lumbar region: Secondary | ICD-10-CM | POA: Diagnosis not present

## 2014-03-02 DIAGNOSIS — M5416 Radiculopathy, lumbar region: Secondary | ICD-10-CM | POA: Diagnosis not present

## 2014-04-05 ENCOUNTER — Ambulatory Visit: Payer: Self-pay | Admitting: Pain Medicine

## 2014-04-05 DIAGNOSIS — M461 Sacroiliitis, not elsewhere classified: Secondary | ICD-10-CM | POA: Diagnosis not present

## 2014-04-05 DIAGNOSIS — M169 Osteoarthritis of hip, unspecified: Secondary | ICD-10-CM | POA: Diagnosis not present

## 2014-04-05 DIAGNOSIS — M25552 Pain in left hip: Secondary | ICD-10-CM | POA: Diagnosis not present

## 2014-04-05 DIAGNOSIS — I10 Essential (primary) hypertension: Secondary | ICD-10-CM | POA: Diagnosis not present

## 2014-04-05 DIAGNOSIS — F329 Major depressive disorder, single episode, unspecified: Secondary | ICD-10-CM | POA: Diagnosis not present

## 2014-04-05 DIAGNOSIS — M47817 Spondylosis without myelopathy or radiculopathy, lumbosacral region: Secondary | ICD-10-CM | POA: Diagnosis not present

## 2014-04-05 DIAGNOSIS — M79605 Pain in left leg: Secondary | ICD-10-CM | POA: Diagnosis not present

## 2014-04-05 DIAGNOSIS — M5416 Radiculopathy, lumbar region: Secondary | ICD-10-CM | POA: Diagnosis not present

## 2014-04-05 DIAGNOSIS — E039 Hypothyroidism, unspecified: Secondary | ICD-10-CM | POA: Diagnosis not present

## 2014-04-08 DIAGNOSIS — L918 Other hypertrophic disorders of the skin: Secondary | ICD-10-CM | POA: Diagnosis not present

## 2014-04-08 DIAGNOSIS — L281 Prurigo nodularis: Secondary | ICD-10-CM | POA: Diagnosis not present

## 2014-04-08 DIAGNOSIS — L821 Other seborrheic keratosis: Secondary | ICD-10-CM | POA: Diagnosis not present

## 2014-04-12 DIAGNOSIS — R600 Localized edema: Secondary | ICD-10-CM | POA: Diagnosis not present

## 2014-04-12 DIAGNOSIS — L0390 Cellulitis, unspecified: Secondary | ICD-10-CM | POA: Diagnosis not present

## 2014-04-12 DIAGNOSIS — M79603 Pain in arm, unspecified: Secondary | ICD-10-CM | POA: Diagnosis not present

## 2014-04-12 DIAGNOSIS — K219 Gastro-esophageal reflux disease without esophagitis: Secondary | ICD-10-CM | POA: Diagnosis not present

## 2014-04-18 ENCOUNTER — Ambulatory Visit: Payer: Medicare Other

## 2014-04-18 DIAGNOSIS — M4800 Spinal stenosis, site unspecified: Secondary | ICD-10-CM | POA: Diagnosis not present

## 2014-04-18 DIAGNOSIS — M6281 Muscle weakness (generalized): Secondary | ICD-10-CM | POA: Diagnosis not present

## 2014-04-18 DIAGNOSIS — L03116 Cellulitis of left lower limb: Secondary | ICD-10-CM | POA: Diagnosis not present

## 2014-04-18 DIAGNOSIS — I890 Lymphedema, not elsewhere classified: Secondary | ICD-10-CM | POA: Diagnosis not present

## 2014-04-18 DIAGNOSIS — S46911D Strain of unspecified muscle, fascia and tendon at shoulder and upper arm level, right arm, subsequent encounter: Secondary | ICD-10-CM | POA: Diagnosis not present

## 2014-04-18 DIAGNOSIS — J449 Chronic obstructive pulmonary disease, unspecified: Secondary | ICD-10-CM | POA: Diagnosis not present

## 2014-04-19 DIAGNOSIS — M79605 Pain in left leg: Secondary | ICD-10-CM | POA: Diagnosis not present

## 2014-04-19 DIAGNOSIS — R2242 Localized swelling, mass and lump, left lower limb: Secondary | ICD-10-CM | POA: Diagnosis not present

## 2014-04-19 DIAGNOSIS — M25511 Pain in right shoulder: Secondary | ICD-10-CM | POA: Diagnosis not present

## 2014-04-22 DIAGNOSIS — S46911D Strain of unspecified muscle, fascia and tendon at shoulder and upper arm level, right arm, subsequent encounter: Secondary | ICD-10-CM | POA: Diagnosis not present

## 2014-04-22 DIAGNOSIS — J449 Chronic obstructive pulmonary disease, unspecified: Secondary | ICD-10-CM | POA: Diagnosis not present

## 2014-04-22 DIAGNOSIS — M6281 Muscle weakness (generalized): Secondary | ICD-10-CM | POA: Diagnosis not present

## 2014-04-22 DIAGNOSIS — M4800 Spinal stenosis, site unspecified: Secondary | ICD-10-CM | POA: Diagnosis not present

## 2014-04-22 DIAGNOSIS — I890 Lymphedema, not elsewhere classified: Secondary | ICD-10-CM | POA: Diagnosis not present

## 2014-04-22 DIAGNOSIS — L03116 Cellulitis of left lower limb: Secondary | ICD-10-CM | POA: Diagnosis not present

## 2014-04-26 DIAGNOSIS — L03116 Cellulitis of left lower limb: Secondary | ICD-10-CM | POA: Diagnosis not present

## 2014-04-26 DIAGNOSIS — M6281 Muscle weakness (generalized): Secondary | ICD-10-CM | POA: Diagnosis not present

## 2014-04-26 DIAGNOSIS — M4800 Spinal stenosis, site unspecified: Secondary | ICD-10-CM | POA: Diagnosis not present

## 2014-04-26 DIAGNOSIS — J449 Chronic obstructive pulmonary disease, unspecified: Secondary | ICD-10-CM | POA: Diagnosis not present

## 2014-04-26 DIAGNOSIS — S46911D Strain of unspecified muscle, fascia and tendon at shoulder and upper arm level, right arm, subsequent encounter: Secondary | ICD-10-CM | POA: Diagnosis not present

## 2014-04-26 DIAGNOSIS — I890 Lymphedema, not elsewhere classified: Secondary | ICD-10-CM | POA: Diagnosis not present

## 2014-04-29 DIAGNOSIS — L03119 Cellulitis of unspecified part of limb: Secondary | ICD-10-CM | POA: Diagnosis not present

## 2014-04-29 DIAGNOSIS — R600 Localized edema: Secondary | ICD-10-CM | POA: Diagnosis not present

## 2014-04-29 DIAGNOSIS — L0390 Cellulitis, unspecified: Secondary | ICD-10-CM | POA: Diagnosis not present

## 2014-04-29 DIAGNOSIS — K219 Gastro-esophageal reflux disease without esophagitis: Secondary | ICD-10-CM | POA: Diagnosis not present

## 2014-05-03 DIAGNOSIS — S46911D Strain of unspecified muscle, fascia and tendon at shoulder and upper arm level, right arm, subsequent encounter: Secondary | ICD-10-CM | POA: Diagnosis not present

## 2014-05-03 DIAGNOSIS — L03116 Cellulitis of left lower limb: Secondary | ICD-10-CM | POA: Diagnosis not present

## 2014-05-03 DIAGNOSIS — J449 Chronic obstructive pulmonary disease, unspecified: Secondary | ICD-10-CM | POA: Diagnosis not present

## 2014-05-03 DIAGNOSIS — M6281 Muscle weakness (generalized): Secondary | ICD-10-CM | POA: Diagnosis not present

## 2014-05-03 DIAGNOSIS — I890 Lymphedema, not elsewhere classified: Secondary | ICD-10-CM | POA: Diagnosis not present

## 2014-05-03 DIAGNOSIS — M4800 Spinal stenosis, site unspecified: Secondary | ICD-10-CM | POA: Diagnosis not present

## 2014-05-04 ENCOUNTER — Encounter: Payer: Self-pay | Admitting: Podiatry

## 2014-05-04 ENCOUNTER — Ambulatory Visit (INDEPENDENT_AMBULATORY_CARE_PROVIDER_SITE_OTHER): Payer: Medicare Other | Admitting: Podiatry

## 2014-05-04 DIAGNOSIS — M4800 Spinal stenosis, site unspecified: Secondary | ICD-10-CM | POA: Diagnosis not present

## 2014-05-04 DIAGNOSIS — B351 Tinea unguium: Secondary | ICD-10-CM

## 2014-05-04 DIAGNOSIS — S46911D Strain of unspecified muscle, fascia and tendon at shoulder and upper arm level, right arm, subsequent encounter: Secondary | ICD-10-CM | POA: Diagnosis not present

## 2014-05-04 DIAGNOSIS — M79673 Pain in unspecified foot: Secondary | ICD-10-CM | POA: Diagnosis not present

## 2014-05-04 DIAGNOSIS — M6281 Muscle weakness (generalized): Secondary | ICD-10-CM | POA: Diagnosis not present

## 2014-05-04 NOTE — Progress Notes (Signed)
She presents today with a chief complaint of painful elongated toenails 1 through 5 bilateral.  Objective: Vital signs are stable alert and oriented 3. Pulses are strongly palpable bilateral. Nails are thick yellow dystrophic with mycotic and painful palpation.  Assessment: Pain in limb secondary to onychomycosis 1 through 5 bilateral.  Plan: Debridement of nails 1 through 5 bilateral covered service secondary to pain.

## 2014-05-05 DIAGNOSIS — R600 Localized edema: Secondary | ICD-10-CM | POA: Diagnosis not present

## 2014-05-05 DIAGNOSIS — S46911D Strain of unspecified muscle, fascia and tendon at shoulder and upper arm level, right arm, subsequent encounter: Secondary | ICD-10-CM | POA: Diagnosis not present

## 2014-05-05 DIAGNOSIS — M6281 Muscle weakness (generalized): Secondary | ICD-10-CM | POA: Diagnosis not present

## 2014-05-05 DIAGNOSIS — M4800 Spinal stenosis, site unspecified: Secondary | ICD-10-CM | POA: Diagnosis not present

## 2014-05-05 DIAGNOSIS — I890 Lymphedema, not elsewhere classified: Secondary | ICD-10-CM | POA: Diagnosis not present

## 2014-05-05 DIAGNOSIS — J449 Chronic obstructive pulmonary disease, unspecified: Secondary | ICD-10-CM | POA: Diagnosis not present

## 2014-05-05 DIAGNOSIS — L03119 Cellulitis of unspecified part of limb: Secondary | ICD-10-CM | POA: Diagnosis not present

## 2014-05-05 DIAGNOSIS — L03116 Cellulitis of left lower limb: Secondary | ICD-10-CM | POA: Diagnosis not present

## 2014-05-10 DIAGNOSIS — M4800 Spinal stenosis, site unspecified: Secondary | ICD-10-CM | POA: Diagnosis not present

## 2014-05-10 DIAGNOSIS — I890 Lymphedema, not elsewhere classified: Secondary | ICD-10-CM | POA: Diagnosis not present

## 2014-05-10 DIAGNOSIS — L03116 Cellulitis of left lower limb: Secondary | ICD-10-CM | POA: Diagnosis not present

## 2014-05-10 DIAGNOSIS — J449 Chronic obstructive pulmonary disease, unspecified: Secondary | ICD-10-CM | POA: Diagnosis not present

## 2014-05-10 DIAGNOSIS — S46911D Strain of unspecified muscle, fascia and tendon at shoulder and upper arm level, right arm, subsequent encounter: Secondary | ICD-10-CM | POA: Diagnosis not present

## 2014-05-10 DIAGNOSIS — M6281 Muscle weakness (generalized): Secondary | ICD-10-CM | POA: Diagnosis not present

## 2014-05-12 DIAGNOSIS — R600 Localized edema: Secondary | ICD-10-CM | POA: Diagnosis not present

## 2014-05-12 DIAGNOSIS — K219 Gastro-esophageal reflux disease without esophagitis: Secondary | ICD-10-CM | POA: Diagnosis not present

## 2014-05-12 DIAGNOSIS — L03119 Cellulitis of unspecified part of limb: Secondary | ICD-10-CM | POA: Diagnosis not present

## 2014-05-19 DIAGNOSIS — L03116 Cellulitis of left lower limb: Secondary | ICD-10-CM | POA: Diagnosis not present

## 2014-05-19 DIAGNOSIS — I890 Lymphedema, not elsewhere classified: Secondary | ICD-10-CM | POA: Diagnosis not present

## 2014-05-19 DIAGNOSIS — M4800 Spinal stenosis, site unspecified: Secondary | ICD-10-CM | POA: Diagnosis not present

## 2014-05-19 DIAGNOSIS — S46911D Strain of unspecified muscle, fascia and tendon at shoulder and upper arm level, right arm, subsequent encounter: Secondary | ICD-10-CM | POA: Diagnosis not present

## 2014-05-19 DIAGNOSIS — M6281 Muscle weakness (generalized): Secondary | ICD-10-CM | POA: Diagnosis not present

## 2014-05-19 DIAGNOSIS — J449 Chronic obstructive pulmonary disease, unspecified: Secondary | ICD-10-CM | POA: Diagnosis not present

## 2014-05-24 DIAGNOSIS — I890 Lymphedema, not elsewhere classified: Secondary | ICD-10-CM | POA: Diagnosis not present

## 2014-05-24 DIAGNOSIS — J449 Chronic obstructive pulmonary disease, unspecified: Secondary | ICD-10-CM | POA: Diagnosis not present

## 2014-05-24 DIAGNOSIS — M6281 Muscle weakness (generalized): Secondary | ICD-10-CM | POA: Diagnosis not present

## 2014-05-24 DIAGNOSIS — S46911D Strain of unspecified muscle, fascia and tendon at shoulder and upper arm level, right arm, subsequent encounter: Secondary | ICD-10-CM | POA: Diagnosis not present

## 2014-05-24 DIAGNOSIS — M4800 Spinal stenosis, site unspecified: Secondary | ICD-10-CM | POA: Diagnosis not present

## 2014-05-24 DIAGNOSIS — L03116 Cellulitis of left lower limb: Secondary | ICD-10-CM | POA: Diagnosis not present

## 2014-05-26 DIAGNOSIS — R600 Localized edema: Secondary | ICD-10-CM | POA: Diagnosis not present

## 2014-05-26 DIAGNOSIS — J449 Chronic obstructive pulmonary disease, unspecified: Secondary | ICD-10-CM | POA: Diagnosis not present

## 2014-05-26 DIAGNOSIS — F329 Major depressive disorder, single episode, unspecified: Secondary | ICD-10-CM | POA: Diagnosis not present

## 2014-05-31 DIAGNOSIS — S46911D Strain of unspecified muscle, fascia and tendon at shoulder and upper arm level, right arm, subsequent encounter: Secondary | ICD-10-CM | POA: Diagnosis not present

## 2014-05-31 DIAGNOSIS — J449 Chronic obstructive pulmonary disease, unspecified: Secondary | ICD-10-CM | POA: Diagnosis not present

## 2014-05-31 DIAGNOSIS — M6281 Muscle weakness (generalized): Secondary | ICD-10-CM | POA: Diagnosis not present

## 2014-05-31 DIAGNOSIS — I890 Lymphedema, not elsewhere classified: Secondary | ICD-10-CM | POA: Diagnosis not present

## 2014-05-31 DIAGNOSIS — M4800 Spinal stenosis, site unspecified: Secondary | ICD-10-CM | POA: Diagnosis not present

## 2014-05-31 DIAGNOSIS — L03116 Cellulitis of left lower limb: Secondary | ICD-10-CM | POA: Diagnosis not present

## 2014-06-02 DIAGNOSIS — M4800 Spinal stenosis, site unspecified: Secondary | ICD-10-CM | POA: Diagnosis not present

## 2014-06-02 DIAGNOSIS — L03116 Cellulitis of left lower limb: Secondary | ICD-10-CM | POA: Diagnosis not present

## 2014-06-02 DIAGNOSIS — M6281 Muscle weakness (generalized): Secondary | ICD-10-CM | POA: Diagnosis not present

## 2014-06-02 DIAGNOSIS — S46911D Strain of unspecified muscle, fascia and tendon at shoulder and upper arm level, right arm, subsequent encounter: Secondary | ICD-10-CM | POA: Diagnosis not present

## 2014-06-02 DIAGNOSIS — I890 Lymphedema, not elsewhere classified: Secondary | ICD-10-CM | POA: Diagnosis not present

## 2014-06-02 DIAGNOSIS — J449 Chronic obstructive pulmonary disease, unspecified: Secondary | ICD-10-CM | POA: Diagnosis not present

## 2014-06-02 DIAGNOSIS — R600 Localized edema: Secondary | ICD-10-CM | POA: Diagnosis not present

## 2014-06-02 DIAGNOSIS — L03119 Cellulitis of unspecified part of limb: Secondary | ICD-10-CM | POA: Diagnosis not present

## 2014-06-09 ENCOUNTER — Telehealth: Payer: Self-pay | Admitting: Family Medicine

## 2014-06-09 DIAGNOSIS — M6281 Muscle weakness (generalized): Secondary | ICD-10-CM | POA: Diagnosis not present

## 2014-06-09 DIAGNOSIS — I890 Lymphedema, not elsewhere classified: Secondary | ICD-10-CM | POA: Diagnosis not present

## 2014-06-09 DIAGNOSIS — J449 Chronic obstructive pulmonary disease, unspecified: Secondary | ICD-10-CM | POA: Diagnosis not present

## 2014-06-09 DIAGNOSIS — S46911D Strain of unspecified muscle, fascia and tendon at shoulder and upper arm level, right arm, subsequent encounter: Secondary | ICD-10-CM | POA: Diagnosis not present

## 2014-06-09 DIAGNOSIS — M4800 Spinal stenosis, site unspecified: Secondary | ICD-10-CM | POA: Diagnosis not present

## 2014-06-09 DIAGNOSIS — L03116 Cellulitis of left lower limb: Secondary | ICD-10-CM | POA: Diagnosis not present

## 2014-06-09 MED ORDER — TIOTROPIUM BROMIDE MONOHYDRATE 18 MCG IN CAPS: 18.0000 ug | ORAL_CAPSULE | Freq: Every day | RESPIRATORY_TRACT | Status: DC

## 2014-06-09 NOTE — Telephone Encounter (Signed)
Rx sent 

## 2014-06-09 NOTE — Telephone Encounter (Signed)
[name] with [facility] is requesting a refill for Spiriva.  [facility] mail order.  [id number]#[phone]

## 2014-06-14 ENCOUNTER — Ambulatory Visit: Payer: Self-pay | Admitting: Physician Assistant

## 2014-06-15 ENCOUNTER — Encounter: Payer: Self-pay | Admitting: Family Medicine

## 2014-06-15 ENCOUNTER — Ambulatory Visit (INDEPENDENT_AMBULATORY_CARE_PROVIDER_SITE_OTHER): Payer: Medicare Other | Admitting: Family Medicine

## 2014-06-15 VITALS — BP 104/62 | HR 100 | Temp 97.8°F | Resp 20 | Ht 60.0 in | Wt 137.0 lb

## 2014-06-15 DIAGNOSIS — G4452 New daily persistent headache (NDPH): Secondary | ICD-10-CM

## 2014-06-15 DIAGNOSIS — R51 Headache: Secondary | ICD-10-CM

## 2014-06-15 DIAGNOSIS — Z1389 Encounter for screening for other disorder: Secondary | ICD-10-CM | POA: Diagnosis not present

## 2014-06-15 DIAGNOSIS — J320 Chronic maxillary sinusitis: Secondary | ICD-10-CM

## 2014-06-15 DIAGNOSIS — R404 Transient alteration of awareness: Secondary | ICD-10-CM | POA: Diagnosis not present

## 2014-06-15 DIAGNOSIS — R609 Edema, unspecified: Secondary | ICD-10-CM

## 2014-06-15 DIAGNOSIS — R600 Localized edema: Secondary | ICD-10-CM

## 2014-06-15 DIAGNOSIS — G4486 Cervicogenic headache: Secondary | ICD-10-CM

## 2014-06-15 MED ORDER — DOXYCYCLINE HYCLATE 100 MG PO TABS: 100.0000 mg | ORAL_TABLET | Freq: Two times a day (BID) | ORAL | Status: DC

## 2014-06-15 NOTE — Progress Notes (Signed)
Subjective:    Patient ID: Sandra Brown, female    DOB: 03/16/1926, 81 y.o.   MRN: [id number]  Headache  This is a new problem. The current episode started 1 to 4 weeks ago. The problem occurs constantly. The problem has been gradually improving. The pain is located in the frontal region. The pain radiates to the right neck, left neck and face. The pain quality is not similar to prior headaches (due to neck pain). The quality of the pain is described as dull and aching. The pain is at a severity of 3/10 (pain has gotten up to a 7/10). The pain is moderate (to severe). Associated symptoms include abnormal behavior (2 nights ago, pt states she called her daughter and was talking about people and events from long ago. Pt states she felt she was in a dream, thought she was in her childhood home. Pt states thi shas happened before, and was due to medications), coughing, dizziness, drainage, neck pain, photophobia, rhinorrhea, sinus pressure and weakness. Pertinent negatives include no abdominal pain, anorexia, back pain, blurred vision, ear pain, eye pain, eye redness, eye watering, facial sweating, fever, muscle aches, nausea, numbness, phonophobia, sore throat, swollen glands, tingling, visual change or vomiting. She has tried acetaminophen for the symptoms. The treatment provided mild relief.      Review of Systems  Constitutional: Negative for fever.  HENT: Positive for rhinorrhea and sinus pressure. Negative for ear pain and sore throat.   Eyes: Positive for photophobia. Negative for blurred vision, pain and redness.  Respiratory: Positive for cough.   Gastrointestinal: Negative for nausea, vomiting, abdominal pain and anorexia.  Musculoskeletal: Positive for neck pain. Negative for back pain.  Neurological: Positive for dizziness, weakness and headaches. Negative for tingling and numbness.   Past Medical History  Diagnosis Date  . Hearing loss   . Hypertension   . Reflux   . Frequent  headaches   . Difficulty swallowing   . Hypothyroidism   . Depression   . Cataract    Past Surgical History  Procedure Laterality Date  . Total hip arthroplasty      x 4  . Lumbar laminectomy    . Parathyroidectomy    . Appendectomy    . Abdominal hysterectomy      reports that she has never smoked. She has never used smokeless tobacco. She reports that she does not drink alcohol or use illicit drugs. family history includes Heart disease in her father; Hypertension in her father and mother; Stroke in her mother. Allergies  Allergen Reactions  . Sulfa Antibiotics Rash and Itching    Other reaction(s): Diarrhea and vomiting (finding)  . Erythromycin Nausea And Vomiting    Other reaction(s): Diarrhea and vomiting (finding)  . Aspirin Other (See Comments), Tinitus and Nausea And Vomiting    Ringing of the ears, caused hearing loss both ears Ringing in ears  . Contrast Media [Iodinated Diagnostic Agents] Rash and Hives  . Latex Rash and Itching  . Penicillin G Rash  . Penicillins Rash    Other reaction(s): UNKNOWN  . Tape Rash    Other reaction(s): UNKNOWN Adhesive    BP 104/62 mmHg  Pulse 100  Temp(Src) 97.8 F (36.6 C) (Oral)  Resp 20  Ht 5' (1.524 m)  Wt 137 lb (62.143 kg)  BMI 26.76 kg/m2      Objective:   Physical Exam  Constitutional: She appears well-developed and well-nourished.  HENT:  Head: Normocephalic and atraumatic.  Left Ear: Tympanic  membrane and ear canal normal.  Nose: Mucosal edema (Right greater than left) present. Right sinus exhibits maxillary sinus tenderness. Left sinus exhibits no maxillary sinus tenderness.  Neck:  No pain with ROM when touching chin to neck, but pain with neck lateral movement  Cardiovascular: Normal rate and regular rhythm.   Pulmonary/Chest: Effort normal and breath sounds normal.  Psychiatric: She has a normal mood and affect.          Assessment & Plan:  1. Transient alteration of awareness Pt has been  referred to psych, but the office has not yet scheduled appointment. Advised pt's daughter to call [facility] to set up appointment to discuss this problem.  2. New daily persistent headache New problem. Most likely cervicogenic headache. Will check labs as below. FU pending results. - Sed Rate (ESR) - CBC w/Diff  3. Edema extremities Stable/improving. FU pending lab results. - Comp Met (CMET)  4. Right maxillary sinusitis New problem. Start Doxycycline bid x 7 days sent into pharmacy at [place].  5. Cervicogenic headache See plan for headache.   Patient seen and examined by Jerrell Belfast, MD, and note scribed by Renaldo Fiddler, CMA.  I have reviewed the document for accuracy and completeness and I agree with above. Jerrell Belfast, MD

## 2014-06-16 ENCOUNTER — Telehealth: Payer: Self-pay | Admitting: *Deleted

## 2014-06-16 DIAGNOSIS — R609 Edema, unspecified: Secondary | ICD-10-CM | POA: Diagnosis not present

## 2014-06-16 DIAGNOSIS — G4452 New daily persistent headache (NDPH): Secondary | ICD-10-CM | POA: Diagnosis not present

## 2014-06-16 NOTE — Telephone Encounter (Signed)
Ok to order. Thanks.   

## 2014-06-16 NOTE — Telephone Encounter (Signed)
[facility] called requesting orders for physical therapy twice a week for 6 weeks. Please advise Mickel Baas at [phone]

## 2014-06-17 ENCOUNTER — Other Ambulatory Visit: Payer: Self-pay | Admitting: Family Medicine

## 2014-06-17 DIAGNOSIS — N3941 Urge incontinence: Secondary | ICD-10-CM

## 2014-06-17 DIAGNOSIS — J449 Chronic obstructive pulmonary disease, unspecified: Secondary | ICD-10-CM | POA: Diagnosis not present

## 2014-06-17 DIAGNOSIS — M4800 Spinal stenosis, site unspecified: Secondary | ICD-10-CM | POA: Diagnosis not present

## 2014-06-17 DIAGNOSIS — I890 Lymphedema, not elsewhere classified: Secondary | ICD-10-CM | POA: Diagnosis not present

## 2014-06-17 DIAGNOSIS — S46911D Strain of unspecified muscle, fascia and tendon at shoulder and upper arm level, right arm, subsequent encounter: Secondary | ICD-10-CM | POA: Diagnosis not present

## 2014-06-17 DIAGNOSIS — M6281 Muscle weakness (generalized): Secondary | ICD-10-CM | POA: Diagnosis not present

## 2014-06-17 DIAGNOSIS — E876 Hypokalemia: Secondary | ICD-10-CM

## 2014-06-17 NOTE — Telephone Encounter (Signed)
Left message advising Mickel Baas.   Thanks,   -Mickel Baas

## 2014-06-21 DIAGNOSIS — M6281 Muscle weakness (generalized): Secondary | ICD-10-CM | POA: Diagnosis not present

## 2014-06-21 DIAGNOSIS — J449 Chronic obstructive pulmonary disease, unspecified: Secondary | ICD-10-CM | POA: Diagnosis not present

## 2014-06-21 DIAGNOSIS — M4800 Spinal stenosis, site unspecified: Secondary | ICD-10-CM | POA: Diagnosis not present

## 2014-06-21 DIAGNOSIS — S46911D Strain of unspecified muscle, fascia and tendon at shoulder and upper arm level, right arm, subsequent encounter: Secondary | ICD-10-CM | POA: Diagnosis not present

## 2014-06-21 DIAGNOSIS — I890 Lymphedema, not elsewhere classified: Secondary | ICD-10-CM | POA: Diagnosis not present

## 2014-06-21 DIAGNOSIS — Z961 Presence of intraocular lens: Secondary | ICD-10-CM | POA: Diagnosis not present

## 2014-06-23 DIAGNOSIS — J449 Chronic obstructive pulmonary disease, unspecified: Secondary | ICD-10-CM | POA: Diagnosis not present

## 2014-06-23 DIAGNOSIS — M4800 Spinal stenosis, site unspecified: Secondary | ICD-10-CM | POA: Diagnosis not present

## 2014-06-23 DIAGNOSIS — S46911D Strain of unspecified muscle, fascia and tendon at shoulder and upper arm level, right arm, subsequent encounter: Secondary | ICD-10-CM | POA: Diagnosis not present

## 2014-06-23 DIAGNOSIS — I890 Lymphedema, not elsewhere classified: Secondary | ICD-10-CM | POA: Diagnosis not present

## 2014-06-23 DIAGNOSIS — M6281 Muscle weakness (generalized): Secondary | ICD-10-CM | POA: Diagnosis not present

## 2014-06-24 ENCOUNTER — Other Ambulatory Visit: Payer: Self-pay | Admitting: Family Medicine

## 2014-06-24 DIAGNOSIS — J449 Chronic obstructive pulmonary disease, unspecified: Secondary | ICD-10-CM | POA: Insufficient documentation

## 2014-06-28 DIAGNOSIS — J449 Chronic obstructive pulmonary disease, unspecified: Secondary | ICD-10-CM | POA: Diagnosis not present

## 2014-06-28 DIAGNOSIS — M6281 Muscle weakness (generalized): Secondary | ICD-10-CM | POA: Diagnosis not present

## 2014-06-28 DIAGNOSIS — M4800 Spinal stenosis, site unspecified: Secondary | ICD-10-CM | POA: Diagnosis not present

## 2014-06-28 DIAGNOSIS — I890 Lymphedema, not elsewhere classified: Secondary | ICD-10-CM | POA: Diagnosis not present

## 2014-06-28 DIAGNOSIS — S46911D Strain of unspecified muscle, fascia and tendon at shoulder and upper arm level, right arm, subsequent encounter: Secondary | ICD-10-CM | POA: Diagnosis not present

## 2014-06-29 ENCOUNTER — Telehealth: Payer: Self-pay | Admitting: Family Medicine

## 2014-06-29 NOTE — Telephone Encounter (Signed)
10 cc swish and spit of Duke mouthwash 4 times a day, 8 ounces.

## 2014-06-29 NOTE — Telephone Encounter (Signed)
Please review

## 2014-06-29 NOTE — Telephone Encounter (Signed)
This is a Dr. Venia Minks pt. Sandra Brown with [facility] thinks that pt has a mild case of thrust in her mouth and would like [facility]'s magic mouth wash sent to [facility] [address] Thanks TNP

## 2014-06-30 ENCOUNTER — Encounter: Payer: Self-pay | Admitting: Family Medicine

## 2014-06-30 DIAGNOSIS — S46911D Strain of unspecified muscle, fascia and tendon at shoulder and upper arm level, right arm, subsequent encounter: Secondary | ICD-10-CM | POA: Diagnosis not present

## 2014-06-30 DIAGNOSIS — M6281 Muscle weakness (generalized): Secondary | ICD-10-CM | POA: Diagnosis not present

## 2014-06-30 DIAGNOSIS — M4800 Spinal stenosis, site unspecified: Secondary | ICD-10-CM | POA: Diagnosis not present

## 2014-06-30 DIAGNOSIS — J449 Chronic obstructive pulmonary disease, unspecified: Secondary | ICD-10-CM | POA: Diagnosis not present

## 2014-06-30 DIAGNOSIS — I890 Lymphedema, not elsewhere classified: Secondary | ICD-10-CM | POA: Diagnosis not present

## 2014-07-01 ENCOUNTER — Telehealth: Payer: Self-pay

## 2014-07-01 ENCOUNTER — Ambulatory Visit (INDEPENDENT_AMBULATORY_CARE_PROVIDER_SITE_OTHER): Payer: Medicare Other | Admitting: Family Medicine

## 2014-07-01 ENCOUNTER — Encounter: Payer: Self-pay | Admitting: Family Medicine

## 2014-07-01 ENCOUNTER — Other Ambulatory Visit: Payer: Self-pay | Admitting: Family Medicine

## 2014-07-01 VITALS — BP 116/60 | HR 88 | Temp 98.0°F | Resp 28 | Wt 139.0 lb

## 2014-07-01 DIAGNOSIS — J449 Chronic obstructive pulmonary disease, unspecified: Secondary | ICD-10-CM | POA: Diagnosis not present

## 2014-07-01 DIAGNOSIS — B370 Candidal stomatitis: Secondary | ICD-10-CM

## 2014-07-01 MED ORDER — NYSTATIN 100000 UNIT/ML MT SUSP
5.0000 mL | Freq: Four times a day (QID) | OROMUCOSAL | Status: DC
Start: 2014-07-01 — End: 2014-07-12

## 2014-07-01 NOTE — Progress Notes (Signed)
Subjective:     Patient ID: Sandra Brown, female   DOB: 03-05-1926, 81 y.o.   MRN: [id number]  HPI  Chief Complaint  Patient presents with  . COPD    Pt's nurse at [facility] would like pt to have a referral to pulmonolgy  . Thrush    Possibly. Per [facility]. x 1 month.  Accompanied by her daughter today. States she is in physical therapy and has been getting low oximeter reading when exercising. Would like to optimize her treatment for COPD. Review of chart reveals Duke's Mouthwash was called in earlier for her ? Thrush. States sinus infection cleared from office visit earlier this month.   Review of Systems     Objective:   Physical Exam  Constitutional: She does not have a sickly appearance (well dressed). No distress.  Pulmonary/Chest: No respiratory distress.  Neurological: She is alert.  Lungs: bibasilar coarse crackles Tongue: appears normal    Assessment:    1. Chronic obstructive pulmonary disease, unspecified COPD, unspecified chronic bronchitis type - Ambulatory referral to Pulmonology    Plan:    continue current medication

## 2014-07-01 NOTE — Telephone Encounter (Signed)
Pt's daughter called because pt's Duke's Magic Mouthwash rx was not at the pharmacy. Renaldo Fiddler, CMA

## 2014-07-01 NOTE — Patient Instructions (Signed)
Continue your inhaler and medications for allergies

## 2014-07-01 NOTE — Telephone Encounter (Signed)
Informed [name] as below. Renaldo Fiddler, CMA

## 2014-07-01 NOTE — Telephone Encounter (Signed)
I have sent over Nystatin mouthwash for thrush

## 2014-07-05 ENCOUNTER — Institutional Professional Consult (permissible substitution): Payer: Self-pay | Admitting: Internal Medicine

## 2014-07-05 DIAGNOSIS — S46911D Strain of unspecified muscle, fascia and tendon at shoulder and upper arm level, right arm, subsequent encounter: Secondary | ICD-10-CM | POA: Diagnosis not present

## 2014-07-05 DIAGNOSIS — I890 Lymphedema, not elsewhere classified: Secondary | ICD-10-CM | POA: Diagnosis not present

## 2014-07-05 DIAGNOSIS — M4800 Spinal stenosis, site unspecified: Secondary | ICD-10-CM | POA: Diagnosis not present

## 2014-07-05 DIAGNOSIS — M6281 Muscle weakness (generalized): Secondary | ICD-10-CM | POA: Diagnosis not present

## 2014-07-05 DIAGNOSIS — J449 Chronic obstructive pulmonary disease, unspecified: Secondary | ICD-10-CM | POA: Diagnosis not present

## 2014-07-06 ENCOUNTER — Encounter: Payer: Self-pay | Admitting: Internal Medicine

## 2014-07-06 ENCOUNTER — Ambulatory Visit (INDEPENDENT_AMBULATORY_CARE_PROVIDER_SITE_OTHER): Payer: Medicare Other | Admitting: Internal Medicine

## 2014-07-06 DIAGNOSIS — J449 Chronic obstructive pulmonary disease, unspecified: Secondary | ICD-10-CM | POA: Diagnosis not present

## 2014-07-06 DIAGNOSIS — J988 Other specified respiratory disorders: Secondary | ICD-10-CM | POA: Diagnosis not present

## 2014-07-06 DIAGNOSIS — R0600 Dyspnea, unspecified: Secondary | ICD-10-CM | POA: Insufficient documentation

## 2014-07-06 NOTE — Assessment & Plan Note (Signed)
Differential diagnoses includes advanced age, chest wall mechanics,kyphosis, current infection, chronic sinusitis and postnasal drip  Further evaluation with CT scan of chest to evaluate parenchymal structural integrity and any underlying atypical infection that could be causing recurrent infection.

## 2014-07-06 NOTE — Progress Notes (Signed)
Date: 07/06/2014  MRN# [id number] Sandra Brown [birth year]  Referring Physician:  Dr. Philomena Course Sandra Brown is a 81 y.o. old female seen in consultation for shortness and low oxygen saturation  CC:  Chief Complaint  Patient presents with  . Advice Only    Pt asked to be referred due to low oximeter reading during physcial therapy at [facility]. Pt gets sob with exertion, she has productive cough with yellow mucus, wheezing, but no chest tightness.    HPI:  Patient is a pleasant 81 year old female accompanied by her granddaughter today for further evaluation of shortness of breath with desaturation during physical therapy. Further history in reviewing of records showed that the patient has had recurrent upper respiratory tract infections since the beginning of the year, about 5-6, requiring anti-biotics and steroids. Patient states she has a history of chronic sinusitis, most recent upper respiratory tract infection was 2 weeks ago that required anabiotic only. Given the recurrence of upper spray tract infections have physical therapy has been more troublesome in terms of her desaturating to the mid to low 80s. Patient stated back in December 2015 she had a episode of lower extremity bilateral cellulitis, but that had no relation to her breathing issues that she could link. She does endorse a cough that's mild with thick white sputum, chronic shortness of breath, worsening dyspnea on exertion. Patient experimented with tobacco during her teenage years for about 2 years. 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 She had chronic secondhand smoke exposure from her husband for about 30 years She is currently on Spiriva  PMHX:   Past Medical History  Diagnosis Date  . Hearing loss   . Hypertension   . Reflux   . Frequent headaches   . Difficulty swallowing   . Hypothyroidism   . Depression   . Cataract    Surgical Hx:  Past Surgical History  Procedure  Laterality Date  . Total hip arthroplasty      x 4  . Lumbar laminectomy    . Parathyroidectomy    . Appendectomy    . Abdominal hysterectomy     Family Hx:  Family History  Problem Relation Age of Onset  . Stroke Mother   . Hypertension Mother   . Heart disease Father   . Hypertension Father    Social Hx:   History  Substance Use Topics  . Smoking status: Never Smoker   . Smokeless tobacco: Never Used     Comment: quit 1954  . Alcohol Use: No   Medication:   Current Outpatient Rx  Name  Route  Sig  Dispense  Refill  . ALPRAZolam (XANAX) 0.25 MG tablet   Oral   Take 0.25 mg by mouth.         Marland Kitchen amLODipine (NORVASC) 5 MG tablet   Oral   Take 5 mg by mouth daily.         Marland Kitchen CALCIUM PO   Oral   Take by mouth.         . [facility] RANITIDINE 75 MG tablet            3     Dispense as written.   Marland Kitchen dextromethorphan (DELSYM) 30 MG/5ML liquid   Oral   Take by mouth 2 (two) times daily as needed for cough.         . doxycycline (VIBRA-TABS) 100 MG tablet   Oral   Take 1 tablet (100 mg total)  by mouth 2 (two) times daily.   14 tablet   0   . DULoxetine (CYMBALTA) 30 MG capsule   Oral   Take 30 mg by mouth daily.         . DULoxetine (CYMBALTA) 30 MG capsule   Oral   Take 30 mg by mouth.         . esomeprazole (NEXIUM) 40 MG capsule   Oral   Take 40 mg by mouth.         . Esomeprazole Magnesium (NEXIUM PO)   Oral   Take by mouth 2 (two) times daily.         . Ferrous Fumarate 324 MG TABS            5   . fluticasone (FLONASE) 50 MCG/ACT nasal spray      1 spray by Each Nare route daily.         . furosemide (LASIX) 40 MG tablet   Oral   Take by mouth.         . lamoTRIgine (LAMICTAL) 100 MG tablet   Oral   Take 100 mg by mouth at bedtime.         . lamoTRIgine (LAMICTAL) 150 MG tablet   Oral   Take 100 mg by mouth.         . levocetirizine (XYZAL) 5 MG tablet   Oral   Take 5 mg by mouth as needed for allergies.          Marland Kitchen levocetirizine (XYZAL) 5 MG tablet   Oral   Take by mouth.         . levothyroxine (SYNTHROID, LEVOTHROID) 50 MCG tablet   Oral   Take 50 mcg by mouth daily before breakfast.         . lisinopril-hydrochlorothiazide (PRINZIDE,ZESTORETIC) 10-12.5 MG per tablet   Oral   Take 1 tablet by mouth daily.         . Magnesium Oxide 420 MG TABS   Oral   Take by mouth.         . meclizine (ANTIVERT) 12.5 MG tablet   Oral   Take 12.5 mg by mouth every 4 (four) hours as needed for dizziness.         Marland Kitchen MELATONIN PO   Oral   Take by mouth daily.         . montelukast (SINGULAIR) 10 MG tablet   Oral   Take 10 mg by mouth at bedtime.         . Multiple Vitamin (MULTIVITAMIN) tablet   Oral   Take 1 tablet by mouth daily.         . NON FORMULARY      CL7 Herbal, take by mouth daily         . nystatin (MYCOSTATIN) 100000 UNIT/ML suspension   Oral   Take 5 mLs (500,000 Units total) by mouth 4 (four) times daily.   60 mL   0   . oxybutynin (DITROPAN-XL) 5 MG 24 hr tablet      TAKE 1 TABLET DAILY   90 tablet   2   . potassium chloride (MICRO-K) 10 MEQ CR capsule      TAKE 2 CAPSULES DAILY   180 capsule   2   . potassium citrate (UROCIT-K) 10 MEQ (1080 MG) SR tablet   Oral   Take by mouth.         . predniSONE (DELTASONE) 10 MG tablet  Oral   Take by mouth.         . simvastatin (ZOCOR) 40 MG tablet   Oral   Take 40 mg by mouth.         . SPIRIVA HANDIHALER 18 MCG inhalation capsule      INHALE THE CONTENTS OF 1 CAPSULE DAILY   90 capsule   2   . spironolactone (ALDACTONE) 25 MG tablet   Oral   Take 25 mg by mouth daily.      5   . sucralfate (CARAFATE) 1 G tablet            0   . tamsulosin (FLOMAX) 0.4 MG CAPS capsule   Oral   Take 0.4 mg by mouth.         . Ferrous Fumarate 324 (106 FE) MG TABS   Oral   Take 1 tablet by mouth daily.      5       Allergies:  Sulfa antibiotics; Erythromycin; Aspirin; Contrast  media; Latex; Penicillin g; Penicillins; and Tape  Review of Systems: Gen:  Denies  fever, sweats, chills HEENT: Denies blurred vision, double vision, ear pain, eye pain, hearing loss, nose bleeds, sore throat Cvc:  No dizziness, chest pain or heaviness Resp:   Denies cough or sputum porduction, shortness of breath Gi: Denies swallowing difficulty, stomach pain, nausea or vomiting, diarrhea, constipation, bowel incontinence Gu:  Denies bladder incontinence, burning urine Ext:   No Joint pain, stiffness or swelling Skin: No skin rash, easy bruising or bleeding or hives Endoc:  No polyuria, polydipsia , polyphagia or weight change Psych: No depression, insomnia or hallucinations  Other:  All other systems negative  Physical Examination:   VS: BP 120/60 mmHg  Pulse 61  Temp(Src) 98.4 F (36.9 C) (Oral)  Ht 5' (1.524 m)  Wt 135 lb (61.236 kg)  BMI 26.37 kg/m2  SpO2 91%  General Appearance: No distress  Neuro:without focal findings, mental status, speech normal, alert and oriented, cranial nerves 2-12 intact, reflexes normal and symmetric, sensation grossly normal  HEENT: PERRLA, EOM intact, no ptosis, no other lesions noticed; Mallampati 2 Pulmonary: shallow breath sounds at the bases, no rales, no crackles. Sputum Production:  None Moderate curvature of the spine noted mostly in the kyphotic curvature. CardiovascularNormal S1,S2.  No m/r/g.  Abdominal aorta pulsation normal.    Abdomen: Benign, Soft, non-tender, No masses, hepatosplenomegaly, No lymphadenopathy Renal:  No costovertebral tenderness  GU:  No performed at this time. Endoc: No evident thyromegaly, no signs of acromegaly or Cushing features Skin:   warm, no rashes, no ecchymosis  Extremities: normal, no cyanosis, clubbing, no edema, warm with normal capillary refill. Other findings: None     Rad results: (The following images and results were reviewed by Dr. Stevenson Clinch). CHEST PA (OR AP) AND LAT  - Aug 19 2012  3:15PM    RESULT: The lungs are mildly hypoinflated. There is no focal infiltrate.  There is blunting of the posterior costophrenic angle on the left. The  cardiac silhouette is mildly enlarged. The pulmonary vascularity is not  engorged.  The observed portions of the bony thorax exhibit no acute abnormalities.  IMPRESSION:   1. There is a small left pleural effusion. There is no pneumothorax or  evidence of a pulmonary contusion. 2. There is stable mild enlargement of the cardiac silhouette.     Assessment and Plan: 81 year old see in consultation for shortness of breath Recurrent respiratory infection Differential diagnoses includes advanced age,  chest wall mechanics,kyphosis, current infection, chronic sinusitis and postnasal drip  Further evaluation with CT scan of chest to evaluate parenchymal structural integrity and any underlying atypical infection that could be causing recurrent infection.    COPD (chronic obstructive pulmonary disease) Known diagnosis COPD, most likely secondary to chronic secondhand smoke exposure. Her kyphosis does add to discomfort and impaired some chest wall mechanics of breathing which can exacerbate or amplify shortness of breath. Continue physical therapy as tolerated, rule out any underlying infectious etiology with CT scan of chest without contrast  Plan: Continue inhalers, CT scan of chest without contrast, continue with walker  Dyspnea Multifactorial: Advanced age, deconditioning, recurrent infection,  Plan: -Spirometry, 6 minute walk test with walker, CT scan of chest noncontrast    Updated Medication List Outpatient Encounter Prescriptions as of 07/06/2014  Medication Sig  . ALPRAZolam (XANAX) 0.25 MG tablet Take 0.25 mg by mouth.  Marland Kitchen amLODipine (NORVASC) 5 MG tablet Take 5 mg by mouth daily.  Marland Kitchen CALCIUM PO Take by mouth.  . [facility] RANITIDINE 75 MG tablet   . dextromethorphan (DELSYM) 30 MG/5ML liquid Take by mouth 2 (two) times daily as needed  for cough.  . doxycycline (VIBRA-TABS) 100 MG tablet Take 1 tablet (100 mg total) by mouth 2 (two) times daily.  . DULoxetine (CYMBALTA) 30 MG capsule Take 30 mg by mouth daily.  . DULoxetine (CYMBALTA) 30 MG capsule Take 30 mg by mouth.  . esomeprazole (NEXIUM) 40 MG capsule Take 40 mg by mouth.  . Esomeprazole Magnesium (NEXIUM PO) Take by mouth 2 (two) times daily.  . Ferrous Fumarate 324 MG TABS   . fluticasone (FLONASE) 50 MCG/ACT nasal spray 1 spray by Each Nare route daily.  . furosemide (LASIX) 40 MG tablet Take by mouth.  . lamoTRIgine (LAMICTAL) 100 MG tablet Take 100 mg by mouth at bedtime.  . lamoTRIgine (LAMICTAL) 150 MG tablet Take 100 mg by mouth.  . levocetirizine (XYZAL) 5 MG tablet Take 5 mg by mouth as needed for allergies.  Marland Kitchen levocetirizine (XYZAL) 5 MG tablet Take by mouth.  . levothyroxine (SYNTHROID, LEVOTHROID) 50 MCG tablet Take 50 mcg by mouth daily before breakfast.  . lisinopril-hydrochlorothiazide (PRINZIDE,ZESTORETIC) 10-12.5 MG per tablet Take 1 tablet by mouth daily.  . Magnesium Oxide 420 MG TABS Take by mouth.  . meclizine (ANTIVERT) 12.5 MG tablet Take 12.5 mg by mouth every 4 (four) hours as needed for dizziness.  Marland Kitchen MELATONIN PO Take by mouth daily.  . montelukast (SINGULAIR) 10 MG tablet Take 10 mg by mouth at bedtime.  . Multiple Vitamin (MULTIVITAMIN) tablet Take 1 tablet by mouth daily.  . NON FORMULARY CL7 Herbal, take by mouth daily  . nystatin (MYCOSTATIN) 100000 UNIT/ML suspension Take 5 mLs (500,000 Units total) by mouth 4 (four) times daily.  Marland Kitchen oxybutynin (DITROPAN-XL) 5 MG 24 hr tablet TAKE 1 TABLET DAILY  . potassium chloride (MICRO-K) 10 MEQ CR capsule TAKE 2 CAPSULES DAILY  . potassium citrate (UROCIT-K) 10 MEQ (1080 MG) SR tablet Take by mouth.  . predniSONE (DELTASONE) 10 MG tablet Take by mouth.  . simvastatin (ZOCOR) 40 MG tablet Take 40 mg by mouth.  . SPIRIVA HANDIHALER 18 MCG inhalation capsule INHALE THE CONTENTS OF 1 CAPSULE DAILY   . spironolactone (ALDACTONE) 25 MG tablet Take 25 mg by mouth daily.  . sucralfate (CARAFATE) 1 G tablet   . tamsulosin (FLOMAX) 0.4 MG CAPS capsule Take 0.4 mg by mouth.  . Ferrous Fumarate 324 (106 FE) MG TABS  Take 1 tablet by mouth daily.   No facility-administered encounter medications on file as of 07/06/2014.    Orders for this visit: Orders Placed This Encounter  Procedures  . CT Chest Wo Contrast    Standing Status: Future     Number of Occurrences:      Standing Expiration Date: 09/05/2015    Scheduling Instructions:     Sob, recurrent respiratory infections    Order Specific Question:  Reason for Exam (SYMPTOM  OR DIAGNOSIS REQUIRED)    Answer:  sob/cough    Order Specific Question:  Preferred imaging location?    Answer:  [facility]  . Spirometry with Graph    Scheduling Instructions:     To be performed next office visit    Order Specific Question:  Where should this test be performed?    Answer:  [facility]    Order Specific Question:  Basic spirometry    Answer:  Yes  . Pulmonary function test    Standing Status: Future     Number of Occurrences:      Standing Expiration Date: 07/06/2015    Order Specific Question:  Where should this test be performed?    Answer:  [facility]    Order Specific Question:  MIP/MEP    Answer:  No    Order Specific Question:  6 minute walk    Answer:  Yes    Order Specific Question:  ABG    Answer:  No    Order Specific Question:  Diffusion capacity (DLCO)    Answer:  No    Order Specific Question:  Lung volumes    Answer:  No    Order Specific Question:  Methacholine challenge    Answer:  No     Thank  you for the consultation and for allowing [facility] to assist in the care of your patient. Our recommendations are noted above.  Please contact us if we can be of further service.   Vilinda Boehringer, MD [facility] Office Number: [phone]

## 2014-07-06 NOTE — Assessment & Plan Note (Signed)
Multifactorial: Advanced age, deconditioning, recurrent infection,  Plan: -Spirometry, 6 minute walk test with walker, CT scan of chest noncontrast

## 2014-07-06 NOTE — Assessment & Plan Note (Signed)
Known diagnosis COPD, most likely secondary to chronic secondhand smoke exposure. Her kyphosis does add to discomfort and impaired some chest wall mechanics of breathing which can exacerbate or amplify shortness of breath. Continue physical therapy as tolerated, rule out any underlying infectious etiology with CT scan of chest without contrast  Plan: Continue inhalers, CT scan of chest without contrast, continue with walker

## 2014-07-06 NOTE — Patient Instructions (Addendum)
Follow up with Dr. Stevenson Clinch in 1 month - CT chest without contrast for COPD, shortness of breath, recurrent respiratory infection prior to follow up visit. - 6MWT with walker prior to follow up visit - spirometry prior to follow up visit - cont with physical therapy as tolerated - cont with your current inhalers.

## 2014-07-07 ENCOUNTER — Other Ambulatory Visit: Payer: Self-pay | Admitting: Family Medicine

## 2014-07-07 ENCOUNTER — Encounter (INDEPENDENT_AMBULATORY_CARE_PROVIDER_SITE_OTHER): Payer: Medicare Other | Admitting: Family Medicine

## 2014-07-07 DIAGNOSIS — S46911D Strain of unspecified muscle, fascia and tendon at shoulder and upper arm level, right arm, subsequent encounter: Secondary | ICD-10-CM

## 2014-07-07 DIAGNOSIS — J449 Chronic obstructive pulmonary disease, unspecified: Secondary | ICD-10-CM | POA: Diagnosis not present

## 2014-07-07 DIAGNOSIS — M4800 Spinal stenosis, site unspecified: Secondary | ICD-10-CM

## 2014-07-07 DIAGNOSIS — M6281 Muscle weakness (generalized): Secondary | ICD-10-CM

## 2014-07-07 DIAGNOSIS — I890 Lymphedema, not elsewhere classified: Secondary | ICD-10-CM | POA: Diagnosis not present

## 2014-07-07 DIAGNOSIS — K219 Gastro-esophageal reflux disease without esophagitis: Secondary | ICD-10-CM

## 2014-07-07 MED ORDER — SUCRALFATE 1 G PO TABS: 1.0000 g | ORAL_TABLET | Freq: Two times a day (BID) | ORAL | Status: DC

## 2014-07-07 NOTE — Telephone Encounter (Signed)
Pt contacted office for refill request on the following medications:  sucralfate (CARAFATE) 1 G tablet.  [facility].  [phone]

## 2014-07-08 ENCOUNTER — Telehealth: Payer: Self-pay | Admitting: Family Medicine

## 2014-07-08 NOTE — Telephone Encounter (Signed)
Sandra Brown with [facility] states pt is having tongue and is requesting a refill for nystatin (MYCOSTATIN) 100000 UNIT/ML suspension.  [facility].  Pt has a yeast infection under pt left breast.  Mand is requesting a Rx for Nystatin powder.  [facility].  GB#[phone]

## 2014-07-11 ENCOUNTER — Other Ambulatory Visit: Payer: Self-pay | Admitting: Family Medicine

## 2014-07-12 ENCOUNTER — Telehealth: Payer: Self-pay | Admitting: Family Medicine

## 2014-07-12 DIAGNOSIS — B370 Candidal stomatitis: Secondary | ICD-10-CM

## 2014-07-12 DIAGNOSIS — M6281 Muscle weakness (generalized): Secondary | ICD-10-CM | POA: Diagnosis not present

## 2014-07-12 DIAGNOSIS — M4800 Spinal stenosis, site unspecified: Secondary | ICD-10-CM | POA: Diagnosis not present

## 2014-07-12 DIAGNOSIS — J449 Chronic obstructive pulmonary disease, unspecified: Secondary | ICD-10-CM | POA: Diagnosis not present

## 2014-07-12 DIAGNOSIS — I890 Lymphedema, not elsewhere classified: Secondary | ICD-10-CM | POA: Diagnosis not present

## 2014-07-12 DIAGNOSIS — B354 Tinea corporis: Secondary | ICD-10-CM | POA: Insufficient documentation

## 2014-07-12 DIAGNOSIS — S46911D Strain of unspecified muscle, fascia and tendon at shoulder and upper arm level, right arm, subsequent encounter: Secondary | ICD-10-CM | POA: Diagnosis not present

## 2014-07-12 MED ORDER — NYSTATIN 100000 UNIT/GM EX OINT: 1.0000 "application " | TOPICAL_OINTMENT | Freq: Two times a day (BID) | CUTANEOUS | Status: DC

## 2014-07-12 MED ORDER — NYSTATIN 100000 UNIT/ML MT SUSP: 5.0000 mL | Freq: Four times a day (QID) | OROMUCOSAL | Status: DC

## 2014-07-12 NOTE — Telephone Encounter (Signed)
Sent in rx. Please notify facility.  Thanks.

## 2014-07-12 NOTE — Telephone Encounter (Signed)
[name] Nurse at [facility] called wanting to get a refill for her thrush and also for the yeast under left breast.  Please advise.  [phone].

## 2014-07-12 NOTE — Telephone Encounter (Signed)
Left message advising [name].    Thanks,   -Mickel Baas

## 2014-07-14 ENCOUNTER — Other Ambulatory Visit: Payer: Self-pay | Admitting: Family Medicine

## 2014-07-14 DIAGNOSIS — S46911D Strain of unspecified muscle, fascia and tendon at shoulder and upper arm level, right arm, subsequent encounter: Secondary | ICD-10-CM | POA: Diagnosis not present

## 2014-07-14 DIAGNOSIS — K219 Gastro-esophageal reflux disease without esophagitis: Secondary | ICD-10-CM

## 2014-07-14 DIAGNOSIS — I890 Lymphedema, not elsewhere classified: Secondary | ICD-10-CM | POA: Diagnosis not present

## 2014-07-14 DIAGNOSIS — D509 Iron deficiency anemia, unspecified: Secondary | ICD-10-CM

## 2014-07-14 DIAGNOSIS — M6281 Muscle weakness (generalized): Secondary | ICD-10-CM | POA: Diagnosis not present

## 2014-07-14 DIAGNOSIS — M4800 Spinal stenosis, site unspecified: Secondary | ICD-10-CM | POA: Diagnosis not present

## 2014-07-14 DIAGNOSIS — J449 Chronic obstructive pulmonary disease, unspecified: Secondary | ICD-10-CM | POA: Diagnosis not present

## 2014-07-19 ENCOUNTER — Ambulatory Visit: Payer: Self-pay | Admitting: Psychiatry

## 2014-07-21 ENCOUNTER — Other Ambulatory Visit: Payer: Self-pay | Admitting: Family Medicine

## 2014-07-21 DIAGNOSIS — S46911D Strain of unspecified muscle, fascia and tendon at shoulder and upper arm level, right arm, subsequent encounter: Secondary | ICD-10-CM | POA: Diagnosis not present

## 2014-07-21 DIAGNOSIS — M4800 Spinal stenosis, site unspecified: Secondary | ICD-10-CM | POA: Diagnosis not present

## 2014-07-21 DIAGNOSIS — J449 Chronic obstructive pulmonary disease, unspecified: Secondary | ICD-10-CM | POA: Diagnosis not present

## 2014-07-21 DIAGNOSIS — D509 Iron deficiency anemia, unspecified: Secondary | ICD-10-CM

## 2014-07-21 DIAGNOSIS — M6281 Muscle weakness (generalized): Secondary | ICD-10-CM | POA: Diagnosis not present

## 2014-07-21 DIAGNOSIS — I890 Lymphedema, not elsewhere classified: Secondary | ICD-10-CM | POA: Diagnosis not present

## 2014-07-25 ENCOUNTER — Other Ambulatory Visit: Payer: Self-pay | Admitting: Family Medicine

## 2014-07-25 DIAGNOSIS — B370 Candidal stomatitis: Secondary | ICD-10-CM

## 2014-07-25 MED ORDER — NYSTATIN 100000 UNIT/ML MT SUSP: 5.0000 mL | Freq: Four times a day (QID) | OROMUCOSAL | Status: DC

## 2014-07-25 NOTE — Telephone Encounter (Signed)
nystatin (MYCOSTATIN) 100000 UNIT/ML suspension [facility] ask for refill.  Thanks Con Memos

## 2014-07-26 DIAGNOSIS — I890 Lymphedema, not elsewhere classified: Secondary | ICD-10-CM | POA: Diagnosis not present

## 2014-07-26 DIAGNOSIS — S46911D Strain of unspecified muscle, fascia and tendon at shoulder and upper arm level, right arm, subsequent encounter: Secondary | ICD-10-CM | POA: Diagnosis not present

## 2014-07-26 DIAGNOSIS — J449 Chronic obstructive pulmonary disease, unspecified: Secondary | ICD-10-CM | POA: Diagnosis not present

## 2014-07-26 DIAGNOSIS — M4800 Spinal stenosis, site unspecified: Secondary | ICD-10-CM | POA: Diagnosis not present

## 2014-07-26 DIAGNOSIS — M6281 Muscle weakness (generalized): Secondary | ICD-10-CM | POA: Diagnosis not present

## 2014-07-27 ENCOUNTER — Ambulatory Visit
Admission: RE | Admit: 2014-07-27 | Discharge: 2014-07-27 | Disposition: A | Discharge status: Home / Self Care | Payer: Medicare Other | Source: Ambulatory Visit | Attending: Internal Medicine | Admitting: Internal Medicine

## 2014-07-27 DIAGNOSIS — J8410 Pulmonary fibrosis, unspecified: Secondary | ICD-10-CM | POA: Diagnosis not present

## 2014-07-27 DIAGNOSIS — J988 Other specified respiratory disorders: Secondary | ICD-10-CM | POA: Diagnosis not present

## 2014-07-27 DIAGNOSIS — J449 Chronic obstructive pulmonary disease, unspecified: Secondary | ICD-10-CM | POA: Insufficient documentation

## 2014-07-27 DIAGNOSIS — R0600 Dyspnea, unspecified: Secondary | ICD-10-CM | POA: Diagnosis not present

## 2014-07-27 DIAGNOSIS — I700 Atherosclerosis of aorta: Secondary | ICD-10-CM | POA: Diagnosis not present

## 2014-07-27 DIAGNOSIS — K449 Diaphragmatic hernia without obstruction or gangrene: Secondary | ICD-10-CM | POA: Diagnosis not present

## 2014-07-27 DIAGNOSIS — I2510 Atherosclerotic heart disease of native coronary artery without angina pectoris: Secondary | ICD-10-CM | POA: Insufficient documentation

## 2014-07-28 ENCOUNTER — Ambulatory Visit (INDEPENDENT_AMBULATORY_CARE_PROVIDER_SITE_OTHER): Payer: Medicare Other | Admitting: Internal Medicine

## 2014-07-28 ENCOUNTER — Encounter: Payer: Self-pay | Admitting: Internal Medicine

## 2014-07-28 VITALS — BP 128/60 | HR 105 | Ht 60.0 in | Wt 133.0 lb

## 2014-07-28 DIAGNOSIS — J988 Other specified respiratory disorders: Secondary | ICD-10-CM

## 2014-07-28 DIAGNOSIS — S46911D Strain of unspecified muscle, fascia and tendon at shoulder and upper arm level, right arm, subsequent encounter: Secondary | ICD-10-CM | POA: Diagnosis not present

## 2014-07-28 DIAGNOSIS — R0600 Dyspnea, unspecified: Secondary | ICD-10-CM

## 2014-07-28 DIAGNOSIS — J449 Chronic obstructive pulmonary disease, unspecified: Secondary | ICD-10-CM | POA: Diagnosis not present

## 2014-07-28 DIAGNOSIS — R0602 Shortness of breath: Secondary | ICD-10-CM | POA: Diagnosis not present

## 2014-07-28 DIAGNOSIS — I890 Lymphedema, not elsewhere classified: Secondary | ICD-10-CM | POA: Diagnosis not present

## 2014-07-28 DIAGNOSIS — M4800 Spinal stenosis, site unspecified: Secondary | ICD-10-CM | POA: Diagnosis not present

## 2014-07-28 DIAGNOSIS — M6281 Muscle weakness (generalized): Secondary | ICD-10-CM | POA: Diagnosis not present

## 2014-07-28 NOTE — Patient Instructions (Signed)
Follow up with Dr. Stevenson Clinch in 4 months - cont with spiriva daily - cont with your Nystatin swish/swallow as given by your PCP - cont with sinus regiment (mucines, singulair and flonase).

## 2014-07-28 NOTE — Assessment & Plan Note (Signed)
Known diagnosis COPD, most likely secondary to chronic secondhand smoke exposure. Her kyphosis does add to discomfort and impaired some chest wall mechanics of breathing which can exacerbate or amplify shortness of breath. Continue physical therapy as tolerated Her CT scan does show centrilobular emphysema, and some mild pulmonary fibrosis at the bases. These findings were discussed with the patient and her daughter, given her advanced age and level of deconditioning with kyphosis there is no further therapy or intervention needed for COPD with mild pulmonary fibrosis. Patient did note desats 80% after walking for about 3 minutes, she stopped the test early due to intense fatigue. I suspect that if she had completed the full 6 minute walk test she may required some level of supplemental oxygen with exertion, however, unable to objectively determine that at this point. I would recommend 2-3 L of supplemental oxygen with physical therapy. Spirometry in the office today shows FEV1 62%, with a preserved ratio of FEV1 to FVC of 90%, FVC 67%. Given the low FEV1 and FVC patient should be on a ICS/LABA (ie Advair, symbicort, dulera, etc), but given her chronic issues with thrush I do not believe she'll be able to tolerate these meds. Spirometry does show moderate obstruction consistent with COPD.  Plan: - Continue with Spiriva - Avoid any allergens or triggers - May use supplemental oxygen with physical therapy 2-3 L, will defer to patient and primary care for further discussion of this, since patient does not think she will require it at this time.

## 2014-07-28 NOTE — Assessment & Plan Note (Signed)
Differential diagnoses includes advanced age, chest wall mechanics,kyphosis, current infection, chronic sinusitis and postnasal drip  Gi scan with centrilobular emphysema and mild basilar pulmonary fibrosis, no significant pulmonary infiltrates and noted. I suspect chronic sinusitis his causing recurrent upper respiratory tract infections, she is currently on Flonase/Mucinex/Singulair for sinus control, which I agree with.  Plan: -Continue with current sinus regiment

## 2014-07-28 NOTE — Assessment & Plan Note (Signed)
Multifactorial: Advanced age, deconditioning, recurrent infection,  Plan: - Continue with physical therapy as tolerated - Continue with Spiriva, continue with sinus regiment

## 2014-07-28 NOTE — Progress Notes (Signed)
MRN# [id number] ALFRED ECKLEY September 23, 1926   CC: Chief Complaint  Patient presents with  . Follow-up    COPD; breathing has been same since last visit; prod cough w/ dark green/foamy mucus; SOB w/activity      Brief History: 07/06/14 HPI:  Patient is a pleasant 81 year old female accompanied by her granddaughter today for further evaluation of shortness of breath with desaturation during physical therapy. Further history in reviewing of records showed that the patient has had recurrent upper respiratory tract infections since the beginning of the year, about 5-6, requiring anti-biotics and steroids. Patient states she has a history of chronic sinusitis, most recent upper respiratory tract infection was 2 weeks ago that required antibiotic only. Given the recurrence of upper respiratory tract infections have physical therapy has been more troublesome in terms of her desaturating to the mid to low 80s. Patient stated back in December 2015 she had a episode of lower extremity bilateral cellulitis, but that had no relation to her breathing issues that she could link. She does endorse a cough that's mild with thick white sputum, chronic shortness of breath, worsening dyspnea on exertion. Patient experimented with tobacco during her teenage years for about 2 years. 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 She had chronic secondhand smoke exposure from her husband for about 30 years She is currently on Spiriva Plan: CT chest, PFTs, 6 minute walk test   Events since last clinic visit: Patient presents today for follow-up visit of COPD. She is accompanied by her daughter. She endorses mild white/green sputum production, which she states is normal for her, there is no change in color of her sputum or amount. Overall patient states that she is in stable respiratory status. Today she had a 6 minute walk test attempted. Patient completed 3 minutes lowest saturation was noted to be  88% walked about 120 m/393 feet. She stopped test early due to fatigue.     Medication:   Current Outpatient Rx  Name  Route  Sig  Dispense  Refill  . ALPRAZolam (XANAX) 0.25 MG tablet   Oral   Take 0.25 mg by mouth.         Marland Kitchen amLODipine (NORVASC) 5 MG tablet   Oral   Take 5 mg by mouth daily.         Marland Kitchen CALCIUM PO   Oral   Take by mouth.         . [facility] RANITIDINE 75 MG tablet            3     Dispense as written.   Marland Kitchen dextromethorphan (DELSYM) 30 MG/5ML liquid   Oral   Take by mouth 2 (two) times daily as needed for cough.         . DULoxetine (CYMBALTA) 30 MG capsule   Oral   Take 30 mg by mouth daily.         Marland Kitchen esomeprazole (NEXIUM) 40 MG capsule   Oral   Take 40 mg by mouth.         . Ferrous Fumarate 324 (106 FE) MG TABS      TAKE 1 TABLET BY MOUTH EVERY DAY(NOT COVERED BY INS)   90 tablet   3   . fluticasone (FLONASE) 50 MCG/ACT nasal spray      1 spray by Each Nare route daily.         . furosemide (LASIX) 40 MG tablet   Oral   Take by mouth.         Marland Kitchen  lamoTRIgine (LAMICTAL) 100 MG tablet   Oral   Take 100 mg by mouth at bedtime.         Marland Kitchen levocetirizine (XYZAL) 5 MG tablet   Oral   Take 5 mg by mouth as needed for allergies.         Marland Kitchen levothyroxine (SYNTHROID, LEVOTHROID) 50 MCG tablet   Oral   Take 50 mcg by mouth daily before breakfast.         . lisinopril-hydrochlorothiazide (PRINZIDE,ZESTORETIC) 10-12.5 MG per tablet   Oral   Take 1 tablet by mouth daily.         . Magnesium Oxide 420 MG TABS   Oral   Take by mouth.         . meclizine (ANTIVERT) 12.5 MG tablet   Oral   Take 12.5 mg by mouth every 4 (four) hours as needed for dizziness.         Marland Kitchen MELATONIN PO   Oral   Take by mouth daily.         . montelukast (SINGULAIR) 10 MG tablet   Oral   Take 10 mg by mouth at bedtime.         . Multiple Vitamin (MULTIVITAMIN) tablet   Oral   Take 1 tablet by mouth daily.         Marland Kitchen nystatin  (MYCOSTATIN) 100000 UNIT/ML suspension   Oral   Take 5 mLs (500,000 Units total) by mouth 4 (four) times daily.   60 mL   0   . oxybutynin (DITROPAN-XL) 5 MG 24 hr tablet      TAKE 1 TABLET DAILY   90 tablet   2   . potassium chloride (MICRO-K) 10 MEQ CR capsule      TAKE 2 CAPSULES DAILY   180 capsule   2   . simvastatin (ZOCOR) 40 MG tablet   Oral   Take 40 mg by mouth.         . SPIRIVA HANDIHALER 18 MCG inhalation capsule      INHALE THE CONTENTS OF 1 CAPSULE DAILY   90 capsule   2   . spironolactone (ALDACTONE) 25 MG tablet   Oral   Take 25 mg by mouth daily.      5   . sucralfate (CARAFATE) 1 G tablet      TAKE 1 TABLET BY MOUTH 4 TIMES A DAY FOR 7 DAYS THEN TWICE A DAY   60 tablet   5   . tamsulosin (FLOMAX) 0.4 MG CAPS capsule   Oral   Take 0.4 mg by mouth.            Review of Systems: Gen:  Denies  fever, sweats, chills HEENT: Denies blurred vision, double vision, ear pain, eye pain, hearing loss, nose bleeds, sore throat Cvc:  No dizziness, chest pain or heaviness Resp:   Admits [id number] sputum production and shortness of breath Gi: Denies swallowing difficulty, stomach pain, nausea or vomiting, diarrhea, constipation, bowel incontinence Gu:  Denies bladder incontinence, burning urine Ext:   No Joint pain, stiffness or swelling Skin: No skin rash, easy bruising or bleeding or hives Endoc:  No polyuria, polydipsia , polyphagia or weight change Other:  All other systems negative  Allergies:  Sulfa antibiotics; Erythromycin; Aspirin; Contrast media; Latex; Penicillin g; Penicillins; and Tape  Physical Examination:  VS: BP 128/60 mmHg  Pulse 105  Ht 5' (1.524 m)  Wt 133 lb (60.328 kg)  BMI 25.97 kg/m2  SpO2  100%  General Appearance: No distress  HEENT: PERRLA, no ptosis, mild Thrush noted in the posterior right oropharynx Pulmonary: Mild decreased breath sounds at the bilateral bases, some fine rales at the left lower base, otherwise  no significant changes from last exam Cardiovascular:  Normal S1,S2.  No m/r/g.     Abdomen:Exam: Benign, Soft, non-tender, No masses  Skin:   warm, no rashes, no ecchymosis  Extremities: normal, no cyanosis, clubbing, warm with normal capillary refill.      Rad results: (The following images and results were reviewed by Dr. Stevenson Clinch). CT Chest 07/2014 CT CHEST WITHOUT CONTRAST  TECHNIQUE: Multidetector CT imaging of the chest was performed following the standard protocol without IV contrast.  COMPARISON: Chest radiograph August 19, 2012  FINDINGS: There is a degree of underlying centrilobular emphysematous change. There is scarring in each lung base. There is no edema or consolidation. There is mild fibrotic change in the anterior left lung base.  Thyroid appears unremarkable. There is no appreciable thoracic adenopathy.  There is extensive atherosclerotic calcification in the aorta. There is no thoracic aortic aneurysm. There is extensive coronary artery calcification. The pericardium is not thickened.  There is a sizable hiatal type hernia.  In the visualized upper abdomen, gallbladder appears mildly distended. Gallbladder is incompletely visualized. There is extensive atherosclerotic change in the aorta and visualized major mesenteric vessels.  There is degenerative change throughout the thoracic spine. There is anterior wedging of the T5 vertebral body. There is antral listhesis of C7 on T1. There are no blastic or lytic bone lesions. There is extensive osteoarthritic change throughout the cervical and thoracic spine. There is advanced arthropathy in the right shoulder.  IMPRESSION: Areas of lung scarring in the bases with mild interstitial fibrosis in the anterior left base. Underlying centrilobular emphysematous type change. No edema or consolidation.  Extensive atherosclerotic change. Widespread coronary artery calcification is noted.  No  appreciable adenopathy.  Sizable hiatal type hernia.  Gallbladder incompletely visualized but does appear somewhat distended. Visualized gallbladder wall is not thickened.  Extensive bony changes. Note grade II/IV anterolisthesis of C7 on T1.    Assessment and Plan: 81 year old female with history of recurrent pulmonary infections and COPD seen for follow-up visit COPD (chronic obstructive pulmonary disease) Known diagnosis COPD, most likely secondary to chronic secondhand smoke exposure. Her kyphosis does add to discomfort and impaired some chest wall mechanics of breathing which can exacerbate or amplify shortness of breath. Continue physical therapy as tolerated Her CT scan does show centrilobular emphysema, and some mild pulmonary fibrosis at the bases. These findings were discussed with the patient and her daughter, given her advanced age and level of deconditioning with kyphosis there is no further therapy or intervention needed for COPD with mild pulmonary fibrosis. Patient did note desats 80% after walking for about 3 minutes, she stopped the test early due to intense fatigue. I suspect that if she had completed the full 6 minute walk test she may required some level of supplemental oxygen with exertion, however, unable to objectively determine that at this point. I would recommend 2-3 L of supplemental oxygen with physical therapy. Spirometry in the office today shows FEV1 62%, with a preserved ratio of FEV1 to FVC of 90%, FVC 67%. Given the low FEV1 and FVC patient should be on a ICS/LABA (ie Advair, symbicort, dulera, etc), but given her chronic issues with thrush I do not believe she'll be able to tolerate these meds. Spirometry does show moderate obstruction consistent with COPD.  Plan: - Continue with Spiriva - Avoid any allergens or triggers - May use supplemental oxygen with physical therapy 2-3 L, will defer to patient and primary care for further discussion of this,  since patient does not think she will require it at this time.    Recurrent respiratory infection Differential diagnoses includes advanced age, chest wall mechanics,kyphosis, current infection, chronic sinusitis and postnasal drip  Gi scan with centrilobular emphysema and mild basilar pulmonary fibrosis, no significant pulmonary infiltrates and noted. I suspect chronic sinusitis his causing recurrent upper respiratory tract infections, she is currently on Flonase/Mucinex/Singulair for sinus control, which I agree with.  Plan: -Continue with current sinus regiment      Dyspnea Multifactorial: Advanced age, deconditioning, recurrent infection,  Plan: - Continue with physical therapy as tolerated - Continue with Spiriva, continue with sinus regiment      Updated Medication List Outpatient Encounter Prescriptions as of 07/28/2014  Medication Sig  . ALPRAZolam (XANAX) 0.25 MG tablet Take 0.25 mg by mouth.  Marland Kitchen amLODipine (NORVASC) 5 MG tablet Take 5 mg by mouth daily.  Marland Kitchen CALCIUM PO Take by mouth.  . celecoxib (CELEBREX) 200 MG capsule Take 200 mg by mouth daily.  . [facility] RANITIDINE 75 MG tablet   . dextromethorphan (DELSYM) 30 MG/5ML liquid Take by mouth 2 (two) times daily as needed for cough.  . DULoxetine (CYMBALTA) 30 MG capsule Take 30 mg by mouth daily.  Marland Kitchen esomeprazole (NEXIUM) 40 MG capsule Take 40 mg by mouth.  . Ferrous Fumarate 324 (106 FE) MG TABS TAKE 1 TABLET BY MOUTH EVERY DAY(NOT COVERED BY INS)  . fluticasone (FLONASE) 50 MCG/ACT nasal spray 1 spray by Each Nare route daily.  . furosemide (LASIX) 40 MG tablet Take by mouth.  Marland Kitchen guaiFENesin (MUCINEX) 600 MG 12 hr tablet Take 600 mg by mouth 2 (two) times daily.  Marland Kitchen lamoTRIgine (LAMICTAL) 100 MG tablet Take 100 mg by mouth at bedtime.  Marland Kitchen levocetirizine (XYZAL) 5 MG tablet Take 5 mg by mouth as needed for allergies.  Marland Kitchen levothyroxine (SYNTHROID, LEVOTHROID) 50 MCG tablet Take 50 mcg by mouth daily before breakfast.  .  lisinopril-hydrochlorothiazide (PRINZIDE,ZESTORETIC) 10-12.5 MG per tablet Take 1 tablet by mouth daily.  . Magnesium Oxide 420 MG TABS Take by mouth.  . meclizine (ANTIVERT) 12.5 MG tablet Take 12.5 mg by mouth every 4 (four) hours as needed for dizziness.  Marland Kitchen MELATONIN PO Take by mouth daily.  . montelukast (SINGULAIR) 10 MG tablet Take 10 mg by mouth at bedtime.  . Multiple Vitamin (MULTIVITAMIN) tablet Take 1 tablet by mouth daily.  Marland Kitchen nystatin (MYCOSTATIN) 100000 UNIT/ML suspension Take 5 mLs (500,000 Units total) by mouth 4 (four) times daily.  Marland Kitchen oxybutynin (DITROPAN-XL) 5 MG 24 hr tablet TAKE 1 TABLET DAILY  . potassium chloride (MICRO-K) 10 MEQ CR capsule TAKE 2 CAPSULES DAILY  . simvastatin (ZOCOR) 40 MG tablet Take 40 mg by mouth.  . SPIRIVA HANDIHALER 18 MCG inhalation capsule INHALE THE CONTENTS OF 1 CAPSULE DAILY  . spironolactone (ALDACTONE) 25 MG tablet Take 25 mg by mouth daily.  . sucralfate (CARAFATE) 1 G tablet TAKE 1 TABLET BY MOUTH 4 TIMES A DAY FOR 7 DAYS THEN TWICE A DAY  . tamsulosin (FLOMAX) 0.4 MG CAPS capsule Take 0.4 mg by mouth.  . [DISCONTINUED] doxycycline (VIBRA-TABS) 100 MG tablet Take 1 tablet (100 mg total) by mouth 2 (two) times daily.  . [DISCONTINUED] DULoxetine (CYMBALTA) 30 MG capsule Take 30 mg by mouth.  . [DISCONTINUED]  Esomeprazole Magnesium (NEXIUM PO) Take by mouth 2 (two) times daily.  . [DISCONTINUED] Ferrous Fumarate 324 MG TABS   . [DISCONTINUED] lamoTRIgine (LAMICTAL) 150 MG tablet Take 100 mg by mouth.  . [DISCONTINUED] levocetirizine (XYZAL) 5 MG tablet Take by mouth.  . [DISCONTINUED] NON FORMULARY CL7 Herbal, take by mouth daily  . [DISCONTINUED] nystatin ointment (MYCOSTATIN) Apply 1 application topically 2 (two) times daily.  . [DISCONTINUED] potassium citrate (UROCIT-K) 10 MEQ (1080 MG) SR tablet Take by mouth.  . [DISCONTINUED] predniSONE (DELTASONE) 10 MG tablet Take by mouth.   No facility-administered encounter medications on file  as of 07/28/2014.    Orders for this visit: Orders Placed This Encounter  Procedures  . Spirometry with Graph    Order Specific Question:  Where should this test be performed?    Answer:  [facility]    Order Specific Question:  Basic spirometry    Answer:  Yes    Order Specific Question:  Spirometry pre & post bronchodilator    Answer:  No    Thank  you for the visitation and for allowing  [facility] to assist in the care of your patient. Our recommendations are noted above.  Please contact us if we can be of further service.  Vilinda Boehringer, MD [facility] Office Number: [phone]

## 2014-07-28 NOTE — Progress Notes (Signed)
SMW performed today. 

## 2014-07-29 ENCOUNTER — Ambulatory Visit (INDEPENDENT_AMBULATORY_CARE_PROVIDER_SITE_OTHER): Payer: Medicare Other | Admitting: Psychiatry

## 2014-07-29 ENCOUNTER — Encounter: Payer: Self-pay | Admitting: Psychiatry

## 2014-07-29 VITALS — BP 118/58 | HR 82 | Resp 12 | Ht 60.0 in

## 2014-07-29 DIAGNOSIS — F331 Major depressive disorder, recurrent, moderate: Secondary | ICD-10-CM | POA: Diagnosis not present

## 2014-07-29 MED ORDER — ALPRAZOLAM 0.25 MG PO TABS: 0.2500 mg | ORAL_TABLET | Freq: Every day | ORAL | Status: DC | PRN

## 2014-07-29 MED ORDER — ESCITALOPRAM OXALATE 5 MG PO TABS: ORAL_TABLET | ORAL | Status: DC

## 2014-07-29 NOTE — Progress Notes (Signed)
Psychiatric Initial Adult Assessment   Patient Identification: Sandra Brown MRN:  [id number] Date of Evaluation:  07/29/2014 Referral Source: PCP Chief Complaint:   "I've been on Cymbalta for years." Visit Diagnosis: No diagnosis found. Diagnosis:   Patient Active Problem List   Diagnosis Date Noted  . Iron deficiency anemia [D50.9] 07/14/2014  . GERD (gastroesophageal reflux disease) [K21.9] 07/14/2014  . Tinea corporis [B35.4] 07/12/2014  . Thrush [B37.0] 07/12/2014  . Dyspnea [R06.00] 07/06/2014  . Recurrent respiratory infection [J98.8] 07/06/2014  . COPD (chronic obstructive pulmonary disease) [J44.9] 06/24/2014  . Hypokalemia [E87.6] 06/17/2014  . Incomplete bladder emptying [R33.9] 12/09/2012  . Urge incontinence [N39.41] 12/09/2012  . FOM (frequency of micturition) [R35.0] 12/09/2012  . Obstruction of urinary tract [N13.9] 12/09/2012  . Basal cell carcinoma of face [C44.310] 08/20/2010   History of Present Illness:  Patient indicated that she started getting treatment with antidepressants 25 years ago. Of note patient's daughter, Sandra Brown was present for the appointment and also contributed to providing some of the patient's history and background. The patient's reports that most recently she has been on her Cymbalta for several years. However she notices that her "episodes are coming closer together." She states a depressive episode for her consist of her sleeping more, anhedonia and lack of interest, poor concentration, depressed mood. However she states her appetite is the same and she denies ever developing suicidal ideation or any past suicide attempts. In regards to her interests she notices that she will watch TV more when she is depressed as opposed to her favorite past time of reading. She states that her longest episode of depression may have been when she transitioned from the more independent living at her retirement community to the assisted living component. However  daughter interjects that there may have been medication management problems as the patient was living in the independent side and there may have been some mismanagement on the patient's part with her medications.  Note the daughter provides a history that about twice a month the patient thinks that she is in a different time in life. For example the patient cared for her younger brother and the patient's daughter will call her and the patient asked the daughter where the patient's younger brother is as if she needs to look out for her and care for him as she did when he was a child. Other occasions when this occurs patient's daughter states she may ask about where her parents are even though both her parents are deceased. According to the daughter once she reminds the patient that she is not in those times and her younger brother is actually an adult and her parents are deceased the patient usually responds and comes back to reality.  The patient denies any frank auditory or visual hallucinations. In regards to anxiety patient states that she does worry. However she states that the typical worry is about her children and their health. She has one son that does have medical problems and the daughter who is present for the appointment has diabetes and patient worries about them. Regards to unprovoked anxiety she stated it does happen once in a while and the patient is prescribed alprazolam 0.25 mg as needed and typically uses this once or twice a month. Elements:  Duration:  As noted above. Associated Signs/Symptoms: Depression Symptoms:  depressed mood, anhedonia, hypersomnia, difficulty concentrating, anxiety, loss of energy/fatigue, (Hypo) Manic Symptoms:  Negative Anxiety Symptoms:  Panic Symptoms, Worry about children's health as noted above Psychotic  Symptoms:  Delusions, As noted above these brief periods where she actually can believe that she is much younger taking care of her now adult  brother or that she is younger and had a different time and her parents are alive. PTSD Symptoms: Negative   Asian denies any psychiatric hospitalizations. She denies any suicide attempts. She stated that she had seen Dr. Charlott Holler in [place] for 5 years. She states were recently she saw a Dr. Geryl Councilman in the [place] area 2 or 3 times. She states she also saw Dr. Nicolasa Ducking for about 6 months.  Past Medical History:  Past Medical History  Diagnosis Date  . Hearing loss   . Hypertension   . Reflux   . Frequent headaches   . Difficulty swallowing   . Hypothyroidism   . Depression   . Cataract     Past Surgical History  Procedure Laterality Date  . Total hip arthroplasty      x 4  . Lumbar laminectomy    . Parathyroidectomy    . Appendectomy    . Abdominal hysterectomy     Family History:  Family History  Problem Relation Age of Onset  . Stroke Mother   . Hypertension Mother   . Heart disease Father   . Hypertension Father    Social History:   History   Social History  . Marital Status: Widowed    Spouse Name: N/A  . Number of Children: N/A  . Years of Education: N/A   Social History Main Topics  . Smoking status: Never Smoker   . Smokeless tobacco: Never Used     Comment: quit 1954  . Alcohol Use: No  . Drug Use: No  . Sexual Activity: Not on file   Other Topics Concern  . None   Social History Narrative   Additional Social History: Patient states that overall her childhood was good up until she was 81 years old. She states her father was an alcoholic. She denied any physical abuse but stated he would be verbally abusive. Patient was married and she states that her late husband was an alcoholic. She has 2 daughters and 1 son.   She moved into the retirement community 10 years ago as noted above she moved from their independent living area to their assisted living area 5 years ago. Patient related that she did not work after she got married.  He describes a daily  routine of getting up at 6:45 in the morning and that staff at the facility give her medications. She states she'll go back to sleep and then the staff, and again around 8:30. She states she'll make coffee and eat. She states she might have some activity during the day such as going to physical therapy. She states that they also have recreational programs however she states she does not go to too many of those. She states she'll go to lunch and dinner. She states that she stays up oftentimes until 2 or 3 in the morning when she goes to sleep.  Musculoskeletal: Strength & Muscle Tone: decreased uses walker Gait & Station: unsteady stable with walker, slow gait Patient leans: N/A  Psychiatric Specialty Exam: HPI  Review of Systems  Psychiatric/Behavioral: Positive for depression. Negative for suicidal ideas, hallucinations, memory loss and substance abuse. The patient is nervous/anxious and has insomnia.     Blood pressure 118/58, pulse 82, resp. rate 12, height 5' (1.524 m).There is no weight on file to calculate BMI.  General Appearance: Well  Groomed  Eye Contact:  Good  Speech:  Normal Rate  Volume:  Normal  Mood:  Good  Affect:  Somewhat blunted but able to smile  Thought Process:  Linear and Logical  Orientation:  Full (Time, Place, and Person)  Thought Content:  Negative  Suicidal Thoughts:  No  Homicidal Thoughts:  No  Memory:  Immediate;   Good Recent;   Good Remote;   Fair  Judgement:  Good  Insight:  Good  Psychomotor Activity:  Negative  Concentration:  Good  Recall:  [place] of Knowledge:Good  Language: Good  Akathisia:  Negative  Handed:  Right  AIMS (if indicated):  N/A  Assets:  Communication Skills Desire for Improvement Physical Health Social Support  ADL's:  Intact  Cognition: WNL  Sleep:  poor  MMSE Patient scored a 29 out of 30. She missed points for forgetting 1 out of 3 objects after 3 minutes. She was able to recall 3 out of 3 immediately. She was  able to spell the word world backwards correctly.  However, when asked to perform serial sevens she could state "93, 86, 69, 52, 45, 38" thus with this to assess concentration her score would be 26. Is the patient at risk to self?  No. Has the patient been a risk to self in the past 6 months?  No. Has the patient been a risk to self within the distant past?  No. Is the patient a risk to others?  No. Has the patient been a risk to others in the past 6 months?  No. Has the patient been a risk to others within the distant past?  No.  Allergies:   Allergies  Allergen Reactions  . Sulfa Antibiotics Rash and Itching    Other reaction(s): Diarrhea and vomiting (finding)  . Erythromycin Nausea And Vomiting    Other reaction(s): Diarrhea and vomiting (finding)  . Aspirin Other (See Comments), Tinitus and Nausea And Vomiting    Ringing of the ears, caused hearing loss both ears Ringing in ears  . Contrast Media [Iodinated Diagnostic Agents] Rash and Hives  . Latex Rash and Itching  . Penicillin G Rash  . Penicillins Rash    Other reaction(s): UNKNOWN  . Tape Rash    Other reaction(s): UNKNOWN Adhesive   Current Medications: Current Outpatient Prescriptions  Medication Sig Dispense Refill  . ALPRAZolam (XANAX) 0.25 MG tablet Take 1 tablet (0.25 mg total) by mouth daily as needed for anxiety. 30 tablet 1  . ALPRAZolam (XANAX) 0.25 MG tablet Take 1 tablet (0.25 mg total) by mouth daily as needed for anxiety. 30 tablet 1  . ALPRAZolam (XANAX) 0.25 MG tablet Take 1 tablet (0.25 mg total) by mouth daily as needed for anxiety. 30 tablet 1  . amLODipine (NORVASC) 5 MG tablet Take 5 mg by mouth daily.    Marland Kitchen CALCIUM PO Take by mouth.    . celecoxib (CELEBREX) 200 MG capsule Take 200 mg by mouth daily.    . [facility] RANITIDINE 75 MG tablet   3  . dextromethorphan (DELSYM) 30 MG/5ML liquid Take by mouth 2 (two) times daily as needed for cough.    . DULoxetine (CYMBALTA) 30 MG capsule Take 30 mg by mouth  daily.    Marland Kitchen esomeprazole (NEXIUM) 40 MG capsule Take 40 mg by mouth.    . Ferrous Fumarate 324 (106 FE) MG TABS TAKE 1 TABLET BY MOUTH EVERY DAY(NOT COVERED BY INS) 90 tablet 3  . fluticasone (FLONASE) 50 MCG/ACT  nasal spray 1 spray by Each Nare route daily.    . furosemide (LASIX) 40 MG tablet Take by mouth.    Marland Kitchen guaiFENesin (MUCINEX) 600 MG 12 hr tablet Take 600 mg by mouth 2 (two) times daily.    Marland Kitchen lamoTRIgine (LAMICTAL) 100 MG tablet Take 100 mg by mouth at bedtime.    Marland Kitchen levocetirizine (XYZAL) 5 MG tablet Take 5 mg by mouth as needed for allergies.    Marland Kitchen levothyroxine (SYNTHROID, LEVOTHROID) 50 MCG tablet Take 50 mcg by mouth daily before breakfast.    . lisinopril-hydrochlorothiazide (PRINZIDE,ZESTORETIC) 10-12.5 MG per tablet Take 1 tablet by mouth daily.    . Magnesium Oxide 420 MG TABS Take by mouth.    . meclizine (ANTIVERT) 12.5 MG tablet Take 12.5 mg by mouth every 4 (four) hours as needed for dizziness.    Marland Kitchen MELATONIN PO Take by mouth daily.    . montelukast (SINGULAIR) 10 MG tablet Take 10 mg by mouth at bedtime.    . Multiple Vitamin (MULTIVITAMIN) tablet Take 1 tablet by mouth daily.    Marland Kitchen nystatin (MYCOSTATIN) 100000 UNIT/ML suspension Take 5 mLs (500,000 Units total) by mouth 4 (four) times daily. 60 mL 0  . oxybutynin (DITROPAN-XL) 5 MG 24 hr tablet TAKE 1 TABLET DAILY 90 tablet 2  . potassium chloride (MICRO-K) 10 MEQ CR capsule TAKE 2 CAPSULES DAILY 180 capsule 2  . simvastatin (ZOCOR) 40 MG tablet Take 40 mg by mouth.    . SPIRIVA HANDIHALER 18 MCG inhalation capsule INHALE THE CONTENTS OF 1 CAPSULE DAILY 90 capsule 2  . spironolactone (ALDACTONE) 25 MG tablet Take 25 mg by mouth daily.  5  . sucralfate (CARAFATE) 1 G tablet TAKE 1 TABLET BY MOUTH 4 TIMES A DAY FOR 7 DAYS THEN TWICE A DAY 60 tablet 5  . tamsulosin (FLOMAX) 0.4 MG CAPS capsule Take 0.4 mg by mouth.    . escitalopram (LEXAPRO) 5 MG tablet Ache 1 tablet in the morning for 14 days and then increase to 2 tablets  in the morning. Start after you have stopped cymbalta. 60 tablet 1   No current facility-administered medications for this visit.    Previous Psychotropic Medications: Yes  Patient reports prior trials of Abilify but states it made her feel "blah." She states that she has been on Remeron but can't remember the results. She states she was on Effexor and that it worked but then stopped working. She states she's been on Prozac, Wellbutrin and Celexa. She states that they all work but then they would stop working. Substance Abuse History in the last 12 months:  No. Patient quit smoking cigarettes when she became pregnant when her first child. Per records this was in 19. Consequences of Substance Abuse: NA  Medical Decision Making:  Established Problem, Stable/Improving (1), Review of Medication Regimen & Side Effects (2) and Review of New Medication or Change in Dosage (2)  Treatment Plan Summary: Medication management and Plan Patient is currently on Cymbalta 30 mg twice daily. I've instructed her to decrease her dose to 30 mg daily for 1 week and then discontinue the medication. She has been instructed to start Lexapro 5 mg daily for 14 days and then increase to 10 mg daily. Risk and benefits have been discussed and patient is able to consent. For now to avoid multiple medication changes will continue her alprazolam 0.25 mg daily as needed for anxiety. Patient appears to only be using this once or twice a month. For now  we will also continue her Lamictal 50 mg daily. It appears that this was started by her prior psychiatrist for augmentation for depression. However we will consider adjusting this pending response to Lexapro. Ideally we will try to minimize the patient's medications given she is on multiple medications. Did them to discontinue the melatonin as patient reports is ineffective and we again we'll try to minimize the number of medications. I have written out the above instructions on an  instruction sheet that daughter will take back to the assisted living facility  At the end of the appointment the daughter brought up the fact the patient picks at her skin. Patient case she does this when she is anxious. At the next point we may discuss for supplemental trial of Vistaril to perhaps help with this. We may also consider addressing the insomnia with Vistaril or perhaps Rozerem.  We will also have patient sign a release to get the clinic notes from Dr. Charlott Holler as he reportedly saw patient the longest period.  I provided patient with a hard copy prescription for alprazolam. I sent in a prescription for her Lexapro with the above instructions. At this time patient does receive some medications for mail order. His daughter indicated that she or the nurses at her facility will call should she need any other refills.   In regards to risk assessment the patient has risk factors of race, age and affective illness. She has protective factors of no past suicide attempts, good social supports, engage in treatment and no substance use disorder. At this time low risk of imminent harm to herself or others.   Faith Rogue 7/22/20162:50 PM

## 2014-08-08 ENCOUNTER — Telehealth: Payer: Self-pay | Admitting: Internal Medicine

## 2014-08-08 NOTE — Telephone Encounter (Signed)
Medications have been removed from pt's medication list. Sandra Brown is aware that this has been done. Nothing further was needed at this time.

## 2014-08-15 ENCOUNTER — Ambulatory Visit: Payer: Medicare Other | Admitting: Podiatry

## 2014-08-19 ENCOUNTER — Telehealth: Payer: Self-pay | Admitting: Psychiatry

## 2014-08-19 NOTE — Telephone Encounter (Signed)
Spoke with Leafy Ro, the patient's nurse. She indicated the diarrhea is actually episodic and not continuous. Per Leafy Ro the starter does feel like the patient is doing well on this medication aside from the side effect. I did discuss that sometimes GI upset and issues can occur early with the medication. Per Leafy Ro the patient has been on this medication since July 30. The previous instructions were to increase the Lexapro from 5 mg to 10 mg after 14 days. Given the current issue we will continue at the 5 mg dose. Leafy Ro is aware of this and is going to fax an order change form to this clinic. AW

## 2014-08-29 ENCOUNTER — Encounter: Payer: Self-pay | Admitting: Podiatry

## 2014-08-29 ENCOUNTER — Other Ambulatory Visit: Payer: Self-pay | Admitting: Family Medicine

## 2014-08-29 ENCOUNTER — Ambulatory Visit (INDEPENDENT_AMBULATORY_CARE_PROVIDER_SITE_OTHER): Payer: Medicare Other | Admitting: Podiatry

## 2014-08-29 DIAGNOSIS — F32A Depression, unspecified: Secondary | ICD-10-CM | POA: Insufficient documentation

## 2014-08-29 DIAGNOSIS — F329 Major depressive disorder, single episode, unspecified: Secondary | ICD-10-CM

## 2014-08-29 DIAGNOSIS — R600 Localized edema: Secondary | ICD-10-CM | POA: Insufficient documentation

## 2014-08-29 DIAGNOSIS — M79673 Pain in unspecified foot: Secondary | ICD-10-CM | POA: Diagnosis not present

## 2014-08-29 DIAGNOSIS — B351 Tinea unguium: Secondary | ICD-10-CM | POA: Diagnosis not present

## 2014-08-29 NOTE — Progress Notes (Signed)
She presents today with a chief complaint of painful elongated toenails. She states they rubbing her shoes when she walks and they hurt she states that they're too thick for her to cut she no longer do it.  Objective: Vital signs are stable alert and oriented 3 pulses are strongly palpable neurologic sensorium is intact per Semmes-Weinstein monofilament. Deep tendon reflexes are intact bilateral and muscle strength is 5 over 5 dorsiflexion plantar flexors inverters and everters on physical musculatures intact. Orthopedic evaluation demonstrates hammertoe deformities mild hallux valgus warm is bilateral. But asymptomatic. Cutaneous evaluation demonstrates supple well-hydrated cutis her nails are thick yellow dystrophic clinic for mycotic painful palpation as well as debridement.  Assessment: Pain in limb secondary to onychomycosis 1 through 5 bilateral.  Plan: Debrided nails 1 through 5 bilateral.  Roselind Messier DPM

## 2014-09-01 ENCOUNTER — Other Ambulatory Visit: Payer: Self-pay | Admitting: Family Medicine

## 2014-09-01 DIAGNOSIS — E785 Hyperlipidemia, unspecified: Secondary | ICD-10-CM | POA: Insufficient documentation

## 2014-09-02 ENCOUNTER — Encounter: Payer: Self-pay | Admitting: Psychiatry

## 2014-09-02 ENCOUNTER — Ambulatory Visit (INDEPENDENT_AMBULATORY_CARE_PROVIDER_SITE_OTHER): Payer: Medicare Other | Admitting: Psychiatry

## 2014-09-02 VITALS — BP 118/68 | HR 94 | Temp 98.1°F | Ht 60.0 in | Wt 134.6 lb

## 2014-09-02 DIAGNOSIS — F331 Major depressive disorder, recurrent, moderate: Secondary | ICD-10-CM | POA: Diagnosis not present

## 2014-09-02 MED ORDER — MIRTAZAPINE 7.5 MG PO TABS: 7.5000 mg | ORAL_TABLET | Freq: Every day | ORAL | Status: DC

## 2014-09-02 NOTE — Progress Notes (Signed)
[facility] MD/PA/NP OP Progress Note  09/02/2014 11:49 AM Sandra Brown  MRN:  [id number]   Subjective:  Patient returns a follow-up of her major depressive disorder. She again presents with one of her daughters who is present for the appointment. At the last visit we had started Lexapro however patient experienced diarrhea. At that point I had been in touch with her facility and we made the decision to continue her on the 5 mg daily as opposed to any increased to 10 mg. Patient states today continues to be a problem. We reviewed her past medications that were recorded in the initial assessment which included Effexor, Prozac, Wellbutrin. She states all of them worked but then stopped working. She also had been on Remeron but then states she can't recall what exactly happened with that medication. She is also been on Celexa which reportedly worked and then stopped working. They did sign a release to get records from their previous psychiatrist Dr. Clovis Pu. However I have not seen those records yet. I have again discussed with office staff here to try to obtain those.  Given her GI upset we will go with mirtazapine which she has been on the past which might help her with sleep.  Asian also states a stressor for her has been shoulder pain and states that sometimes this worries her and makes her mood down. She did discuss that she might plan to resume seeing pain management. I did tell her I thought this was be a good idea. Chief Complaint: stomach issues Chief Complaint    Follow-up; Medication Refill; Depression; Medication Problem     Visit Diagnosis:  No diagnosis found.  Past Medical History:  Past Medical History  Diagnosis Date  . Hearing loss   . Hypertension   . Reflux   . Frequent headaches   . Difficulty swallowing   . Hypothyroidism   . Depression   . Cataract     Past Surgical History  Procedure Laterality Date  . Total hip arthroplasty      x 4  . Lumbar laminectomy    .  Parathyroidectomy    . Appendectomy    . Abdominal hysterectomy     Family History:  Family History  Problem Relation Age of Onset  . Stroke Mother   . Hypertension Mother   . Heart disease Father   . Hypertension Father    Social History:  Social History   Social History  . Marital Status: Widowed    Spouse Name: N/A  . Number of Children: N/A  . Years of Education: N/A   Social History Main Topics  . Smoking status: Never Smoker   . Smokeless tobacco: Never Used     Comment: quit 1954  . Alcohol Use: No  . Drug Use: No  . Sexual Activity: No   Other Topics Concern  . None   Social History Narrative   Additional History:   Assessment:   Musculoskeletal: Strength & Muscle Tone: She ambulates slowly but without any assistance Gait & Station: normal Patient leans: N/A  Psychiatric Specialty Exam: HPI  Review of Systems  Psychiatric/Behavioral: Positive for depression. Negative for suicidal ideas, hallucinations, memory loss and substance abuse. The patient has insomnia. The patient is not nervous/anxious.     Blood pressure 118/68, pulse 94, temperature 98.1 F (36.7 C), temperature source Tympanic, height 5' (1.524 m), weight 134 lb 9.6 oz (61.054 kg), SpO2 94 %.Body mass index is 26.29 kg/(m^2).  General Appearance: Neat and Well  Groomed  Eye Contact:  Good  Speech:  Normal Rate  Volume:  Normal  Mood:  Okay  Affect:  Constricted  Thought Process:  Linear and Logical  Orientation:  Full (Time, Place, and Person)  Thought Content:  Negative  Suicidal Thoughts:  No  Homicidal Thoughts:  No  Memory:  Immediate;   Good Recent;   Good Remote;   Good  Judgement:  Good  Insight:  Good  Psychomotor Activity:  Negative  Concentration:  Good  Recall:  Good  Fund of Knowledge: Good  Language: Good  Akathisia:  Negative  Handed:  Right unknown   AIMS (if indicated):  N/A  Assets:  Communication Skills Desire for Improvement Social Support  ADL's:   Intact  Cognition: WNL  Sleep:  Fair. She goes to bed at 2AM but is able to sleep once asleep   Is the patient at risk to self?  No. Has the patient been a risk to self in the past 6 months?  No. Has the patient been a risk to self within the distant past?  No. Is the patient a risk to others?  No. Has the patient been a risk to others in the past 6 months?  No. Has the patient been a risk to others within the distant past?  No.  Current Medications: Current Outpatient Prescriptions  Medication Sig Dispense Refill  . ALPRAZolam (XANAX) 0.25 MG tablet Take 1 tablet (0.25 mg total) by mouth daily as needed for anxiety. 30 tablet 1  . CALCIUM PO Take by mouth.    . celecoxib (CELEBREX) 200 MG capsule Take 200 mg by mouth daily.    . [facility] RANITIDINE 75 MG tablet   3  . dextromethorphan (DELSYM) 30 MG/5ML liquid Take by mouth 2 (two) times daily as needed for cough.    . DULoxetine (CYMBALTA) 30 MG capsule TAKE 1 CAPSULE TWICE A DAY 180 capsule 2  . escitalopram (LEXAPRO) 5 MG tablet Ache 1 tablet in the morning for 14 days and then increase to 2 tablets in the morning. Start after you have stopped cymbalta. 60 tablet 1  . esomeprazole (NEXIUM) 40 MG capsule Take 40 mg by mouth.    . Ferrous Fumarate 324 (106 FE) MG TABS TAKE 1 TABLET BY MOUTH EVERY DAY(NOT COVERED BY INS) 90 tablet 3  . fluticasone (FLONASE) 50 MCG/ACT nasal spray 1 spray by Each Nare route daily.    . furosemide (LASIX) 40 MG tablet TAKE 1 TABLET IN THE MORNING 90 tablet 3  . guaiFENesin (MUCINEX) 600 MG 12 hr tablet Take 600 mg by mouth 2 (two) times daily.    Marland Kitchen lamoTRIgine (LAMICTAL) 100 MG tablet TAKE ONE-HALF (1/2) TABLET DAILY 45 tablet 2  . levocetirizine (XYZAL) 5 MG tablet Take 5 mg by mouth as needed for allergies.    Marland Kitchen levothyroxine (SYNTHROID, LEVOTHROID) 50 MCG tablet Take 50 mcg by mouth daily before breakfast.    . Magnesium Oxide 420 MG TABS Take by mouth.    . meclizine (ANTIVERT) 12.5 MG tablet Take 12.5 mg  by mouth every 4 (four) hours as needed for dizziness.    Marland Kitchen MELATONIN PO Take by mouth daily.    . montelukast (SINGULAIR) 10 MG tablet Take 10 mg by mouth at bedtime.    . Multiple Vitamin (MULTIVITAMIN) tablet Take 1 tablet by mouth daily.    Marland Kitchen nystatin (MYCOSTATIN) 100000 UNIT/ML suspension Take 5 mLs (500,000 Units total) by mouth 4 (four) times daily. 60 mL  0  . potassium chloride (MICRO-K) 10 MEQ CR capsule TAKE 2 CAPSULES DAILY 180 capsule 2  . simvastatin (ZOCOR) 20 MG tablet TAKE 1 TABLET DAILY 90 tablet 3  . SPIRIVA HANDIHALER 18 MCG inhalation capsule INHALE THE CONTENTS OF 1 CAPSULE DAILY 90 capsule 2  . spironolactone (ALDACTONE) 25 MG tablet Take 25 mg by mouth daily.  5  . sucralfate (CARAFATE) 1 G tablet TAKE 1 TABLET BY MOUTH 4 TIMES A DAY FOR 7 DAYS THEN TWICE A DAY 60 tablet 5  . tamsulosin (FLOMAX) 0.4 MG CAPS capsule Take 0.4 mg by mouth.    . mirtazapine (REMERON) 7.5 MG tablet Take 1 tablet (7.5 mg total) by mouth at bedtime. 30 tablet 1   No current facility-administered medications for this visit.    Medical Decision Making:  Established Problem, Stable/Improving (1) and Review of New Medication or Change in Dosage (2)  Treatment Plan Summary:Medication management and Plan At this time we will discontinue the Lexapro given her continuous GI upset even at a low dose and weeks after starting the medication. We will start some Remeron 7.5 mg at bedtime. We will continue her Lamictal 50 mg daily and alprazolam .25 mg BID prn anxiety  for now so where not making multiple changes. We will again make efforts to obtain records of the past trials of medications from her former psychiatrist office. Asian will follow up in 1 month.   Faith Rogue 09/02/2014, 11:49 AM

## 2014-09-06 ENCOUNTER — Telehealth: Payer: Self-pay | Admitting: Family Medicine

## 2014-09-06 DIAGNOSIS — B370 Candidal stomatitis: Secondary | ICD-10-CM

## 2014-09-06 MED ORDER — MAGIC MOUTHWASH: 5.0000 mL | Freq: Four times a day (QID) | ORAL | Status: DC

## 2014-09-06 NOTE — Telephone Encounter (Signed)
Last ov was on 05/26/2014.  Please see note below and advise,  Thanks,

## 2014-09-06 NOTE — Telephone Encounter (Signed)
Mandy with [facility] called states pt tongue is sore and has white patches.  She is requesting a Rx for [facility] Magic Mouthwash.  [facility].  FX#[phone]

## 2014-09-07 ENCOUNTER — Other Ambulatory Visit: Payer: Self-pay | Admitting: Family Medicine

## 2014-09-07 NOTE — Telephone Encounter (Signed)
Mandy called to check on the magic mouth wash RX. It looks like it was printed. Can this be sent to [facility] or called in? Please advise. Thanks TNP

## 2014-09-07 NOTE — Telephone Encounter (Signed)
Called in prescription to [facility] for magic mouth wash Rx.   Thanks,

## 2014-09-20 ENCOUNTER — Other Ambulatory Visit: Payer: Self-pay | Admitting: Family Medicine

## 2014-09-20 DIAGNOSIS — D509 Iron deficiency anemia, unspecified: Secondary | ICD-10-CM

## 2014-09-20 DIAGNOSIS — R600 Localized edema: Secondary | ICD-10-CM

## 2014-09-21 ENCOUNTER — Encounter: Payer: Self-pay | Admitting: Pain Medicine

## 2014-09-21 ENCOUNTER — Ambulatory Visit: Payer: Medicare Other | Attending: Pain Medicine | Admitting: Pain Medicine

## 2014-09-21 VITALS — BP 119/65 | HR 101 | Temp 99.1°F | Resp 12

## 2014-09-21 DIAGNOSIS — M19011 Primary osteoarthritis, right shoulder: Secondary | ICD-10-CM

## 2014-09-21 DIAGNOSIS — M461 Sacroiliitis, not elsewhere classified: Secondary | ICD-10-CM | POA: Diagnosis not present

## 2014-09-21 DIAGNOSIS — M5136 Other intervertebral disc degeneration, lumbar region: Secondary | ICD-10-CM | POA: Insufficient documentation

## 2014-09-21 DIAGNOSIS — M5030 Other cervical disc degeneration, unspecified cervical region: Secondary | ICD-10-CM | POA: Insufficient documentation

## 2014-09-21 DIAGNOSIS — M533 Sacrococcygeal disorders, not elsewhere classified: Secondary | ICD-10-CM | POA: Insufficient documentation

## 2014-09-21 DIAGNOSIS — M5481 Occipital neuralgia: Secondary | ICD-10-CM

## 2014-09-21 DIAGNOSIS — R51 Headache: Secondary | ICD-10-CM | POA: Diagnosis present

## 2014-09-21 DIAGNOSIS — M19019 Primary osteoarthritis, unspecified shoulder: Secondary | ICD-10-CM | POA: Insufficient documentation

## 2014-09-21 DIAGNOSIS — M542 Cervicalgia: Secondary | ICD-10-CM | POA: Diagnosis present

## 2014-09-21 DIAGNOSIS — M7060 Trochanteric bursitis, unspecified hip: Secondary | ICD-10-CM | POA: Insufficient documentation

## 2014-09-21 DIAGNOSIS — M47817 Spondylosis without myelopathy or radiculopathy, lumbosacral region: Secondary | ICD-10-CM | POA: Diagnosis not present

## 2014-09-21 DIAGNOSIS — M47816 Spondylosis without myelopathy or radiculopathy, lumbar region: Secondary | ICD-10-CM

## 2014-09-21 DIAGNOSIS — M169 Osteoarthritis of hip, unspecified: Secondary | ICD-10-CM | POA: Diagnosis not present

## 2014-09-21 DIAGNOSIS — M5416 Radiculopathy, lumbar region: Secondary | ICD-10-CM | POA: Diagnosis not present

## 2014-09-21 NOTE — Progress Notes (Signed)
   Subjective:    Patient ID: Sandra Brown, female    DOB: [birth year], 81 y.o.   MRN: [id number]  HPI  Patient is a 81 year old female returns to [facility] for further evaluation and treatment of pain involving the neck and headache upper back and shoulder regions. Patient states that the lower back and lower extremity pain is fairly well-controlled at this time. Patient is to severe spasms occurring in the region of the neck and upper back region causing significant pain in the region of the neck and shoulders and precipitating headaches. We discussed patient's condition and will consider patient for greater occipital nerve block and myoneural block injections at time return appointment in attempt to decrease severity of symptoms, minimize progression of symptoms, and avoid need for more involved treatment. The patient was in agreement with suggested treatment plan.   Review of Systems     Objective:   Physical Exam  There was severe tenderness to palpation of the splenius capitis and occipitalis musculature region on the right greater than left. There was severe muscle spasms of the trapezius musculature region right greater than the left. No new lesions of the head and neck were noted. There was tends to palpation over the region of the cervical facet cervical paraspinal musculature region of severe degree. There appeared to be likely decreased grip strength. Tinel and Phalen's maneuver without increase of pain significant degree. There was tends to palpation over the thoracic facet thoracic paraspinal muscles region of moderate to moderately severe degree. With the right side being more significantly tends to palpation than the left side. There was no crepitus of the thoracic region noted. Palpation over the lumbar paraspinal muscles region lumbar facet region was with mild to moderate discomfort. There was mild to moderate discomfort over the PSIS and PII S region as well as the  gluteal and piriformis musculature region. Straight leg raising tolerated to 30 without increase of pain with dorsiflexion noted. There was negative clonus negative Homans. EHL strength appeared to be slightly decreased. No definite sensory deficit of dermatomal distribution detected. There was negative clonus negative Homans. Abdomen nontender no costovertebral tenderness noted      Assessment & Plan:   Degenerative disc disease cervical spine  Bilateral occipital neuralgia  Degenerative joint disease of shoulder  Degenerative disc disease lumbar spine  Lumbar facet syndrome  Sacroiliac joint dysfunction    PLAN   Continue present medication  Right occipital nerve block to be performed at time return appointment  F/U PCP Dr. Venia Minks for evaliation of  BP and general medical  condition  F/U surgical evaluation. May consider pending follow-up evaluations  F/U neurological evaluation. May consider pending follow-up evaluations  May consider radiofrequency rhizolysis or intraspinal procedures pending response to present treatment and F/U evaluation   Patient to call [facility] should patient have concerns prior to scheduled return appointment.

## 2014-09-21 NOTE — Progress Notes (Signed)
Safety precautions to be maintained throughout the outpatient stay will include: orient to surroundings, keep bed in low position, maintain call bell within reach at all times, provide assistance with transfer out of bed and ambulation.  

## 2014-09-21 NOTE — Patient Instructions (Addendum)
PLAN   Continue present medication  Greater occipital nerve block and shoulder injection to be performed at time return appointment  F/U PCP Dr. Venia Minks for evaliation of  BP and general medical  condition  F/U surgical evaluation. May consider pending follow-up evaluations  F/U neurological evaluation. May consider pending follow-up evaluations  May consider radiofrequency rhizolysis or intraspinal procedures pending response to present treatment and F/U evaluation   Patient to call Pain Management Center should patient have concerns prior to scheduled return appointment. GENERAL RISKS AND COMPLICATIONS  What are the risk, side effects and possible complications? Generally speaking, most procedures are safe.  However, with any procedure there are risks, side effects, and the possibility of complications.  The risks and complications are dependent upon the sites that are lesioned, or the type of nerve block to be performed.  The closer the procedure is to the spine, the more serious the risks are.  Great care is taken when placing the radio frequency needles, block needles or lesioning probes, but sometimes complications can occur. 1. Infection: Any time there is an injection through the skin, there is a risk of infection.  This is why sterile conditions are used for these blocks.  There are four possible types of infection. 1. Localized skin infection. 2. Central Nervous System Infection-This can be in the form of Meningitis, which can be deadly. 3. Epidural Infections-This can be in the form of an epidural abscess, which can cause pressure inside of the spine, causing compression of the spinal cord with subsequent paralysis. This would require an emergency surgery to decompress, and there are no guarantees that the patient would recover from the paralysis. 4. Discitis-This is an infection of the intervertebral discs.  It occurs in about 1% of discography procedures.  It is difficult to  treat and it may lead to surgery.        2. Pain: the needles have to go through skin and soft tissues, will cause soreness.       3. Damage to internal structures:  The nerves to be lesioned may be near blood vessels or    other nerves which can be potentially damaged.       4. Bleeding: Bleeding is more common if the patient is taking blood thinners such as  aspirin, Coumadin, Ticiid, Plavix, etc., or if he/she have some genetic predisposition  such as hemophilia. Bleeding into the spinal canal can cause compression of the spinal  cord with subsequent paralysis.  This would require an emergency surgery to  decompress and there are no guarantees that the patient would recover from the  paralysis.       5. Pneumothorax:  Puncturing of a lung is a possibility, every time a needle is introduced in  the area of the chest or upper back.  Pneumothorax refers to free air around the  collapsed lung(s), inside of the thoracic cavity (chest cavity).  Another two possible  complications related to a similar event would include: Hemothorax and Chylothorax.   These are variations of the Pneumothorax, where instead of air around the collapsed  lung(s), you may have blood or chyle, respectively.       6. Spinal headaches: They may occur with any procedures in the area of the spine.       7. Persistent CSF (Cerebro-Spinal Fluid) leakage: This is a rare problem, but may occur  with prolonged intrathecal or epidural catheters either due to the formation of a fistulous  track or a  dural tear.       8. Nerve damage: By working so close to the spinal cord, there is always a possibility of  nerve damage, which could be as serious as a permanent spinal cord injury with  paralysis.       9. Death:  Although rare, severe deadly allergic reactions known as "Anaphylactic  reaction" can occur to any of the medications used.      10. Worsening of the symptoms:  We can always make thing worse.  What are the chances of something  like this happening? Chances of any of this occuring are extremely low.  By statistics, you have more of a chance of getting killed in a motor vehicle accident: while driving to the hospital than any of the above occurring .  Nevertheless, you should be aware that they are possibilities.  In general, it is similar to taking a shower.  Everybody knows that you can slip, hit your head and get killed.  Does that mean that you should not shower again?  Nevertheless always keep in mind that statistics do not mean anything if you happen to be on the wrong side of them.  Even if a procedure has a 1 (one) in a 1,000,000 (million) chance of going wrong, it you happen to be that one..Also, keep in mind that by statistics, you have more of a chance of having something go wrong when taking medications.  Who should not have this procedure? If you are on a blood thinning medication (e.g. Coumadin, Plavix, see list of "Blood Thinners"), or if you have an active infection going on, you should not have the procedure.  If you are taking any blood thinners, please inform your physician.  How should I prepare for this procedure?  Do not eat or drink anything at least six hours prior to the procedure.  Bring a driver with you .  It cannot be a taxi.  Come accompanied by an adult that can drive you back, and that is strong enough to help you if your legs get weak or numb from the local anesthetic.  Take all of your medicines the morning of the procedure with just enough water to swallow them.  If you have diabetes, make sure that you are scheduled to have your procedure done first thing in the morning, whenever possible.  If you have diabetes, take only half of your insulin dose and notify our nurse that you have done so as soon as you arrive at the clinic.  If you are diabetic, but only take blood sugar pills (oral hypoglycemic), then do not take them on the morning of your procedure.  You may take them after you  have had the procedure.  Do not take aspirin or any aspirin-containing medications, at least eleven (11) days prior to the procedure.  They may prolong bleeding.  Wear loose fitting clothing that may be easy to take off and that you would not mind if it got stained with Betadine or blood.  Do not wear any jewelry or perfume  Remove any nail coloring.  It will interfere with some of our monitoring equipment.  NOTE: Remember that this is not meant to be interpreted as a complete list of all possible complications.  Unforeseen problems may occur.  BLOOD THINNERS The following drugs contain aspirin or other products, which can cause increased bleeding during surgery and should not be taken for 2 weeks prior to and 1 week after surgery.  If you  should need take something for relief of minor pain, you may take acetaminophen which is found in Tylenol,m Datril, Anacin-3 and Panadol. It is not blood thinner. The products listed below are.  Do not take any of the products listed below in addition to any listed on your instruction sheet.  A.P.C or A.P.C with Codeine Codeine Phosphate Capsules #3 Ibuprofen Ridaura  ABC compound Congesprin Imuran rimadil  Advil Cope Indocin Robaxisal  Alka-Seltzer Effervescent Pain Reliever and Antacid Coricidin or Coricidin-D  Indomethacin Rufen  Alka-Seltzer plus Cold Medicine Cosprin Ketoprofen S-A-C Tablets  Anacin Analgesic Tablets or Capsules Coumadin Korlgesic Salflex  Anacin Extra Strength Analgesic tablets or capsules CP-2 Tablets Lanoril Salicylate  Anaprox Cuprimine Capsules Levenox Salocol  Anexsia-D Dalteparin Magan Salsalate  Anodynos Darvon compound Magnesium Salicylate Sine-off  Ansaid Dasin Capsules Magsal Sodium Salicylate  Anturane Depen Capsules Marnal Soma  APF Arthritis pain formula Dewitt's Pills Measurin Stanback  Argesic Dia-Gesic Meclofenamic Sulfinpyrazone  Arthritis Bayer Timed Release Aspirin Diclofenac Meclomen Sulindac  Arthritis pain  formula Anacin Dicumarol Medipren Supac  Analgesic (Safety coated) Arthralgen Diffunasal Mefanamic Suprofen  Arthritis Strength Bufferin Dihydrocodeine Mepro Compound Suprol  Arthropan liquid Dopirydamole Methcarbomol with Aspirin Synalgos  ASA tablets/Enseals Disalcid Micrainin Tagament  Ascriptin Doan's Midol Talwin  Ascriptin A/D Dolene Mobidin Tanderil  Ascriptin Extra Strength Dolobid Moblgesic Ticlid  Ascriptin with Codeine Doloprin or Doloprin with Codeine Momentum Tolectin  Asperbuf Duoprin Mono-gesic Trendar  Aspergum Duradyne Motrin or Motrin IB Triminicin  Aspirin plain, buffered or enteric coated Durasal Myochrisine Trigesic  Aspirin Suppositories Easprin Nalfon Trillsate  Aspirin with Codeine Ecotrin Regular or Extra Strength Naprosyn Uracel  Atromid-S Efficin Naproxen Ursinus  Auranofin Capsules Elmiron Neocylate Vanquish  Axotal Emagrin Norgesic Verin  Azathioprine Empirin or Empirin with Codeine Normiflo Vitamin E  Azolid Emprazil Nuprin Voltaren  Bayer Aspirin plain, buffered or children's or timed BC Tablets or powders Encaprin Orgaran Warfarin Sodium  Buff-a-Comp Enoxaparin Orudis Zorpin  Buff-a-Comp with Codeine Equegesic Os-Cal-Gesic   Buffaprin Excedrin plain, buffered or Extra Strength Oxalid   Bufferin Arthritis Strength Feldene Oxphenbutazone   Bufferin plain or Extra Strength Feldene Capsules Oxycodone with Aspirin   Bufferin with Codeine Fenoprofen Fenoprofen Pabalate or Pabalate-SF   Buffets II Flogesic Panagesic   Buffinol plain or Extra Strength Florinal or Florinal with Codeine Panwarfarin   Buf-Tabs Flurbiprofen Penicillamine   Butalbital Compound Four-way cold tablets Penicillin   Butazolidin Fragmin Pepto-Bismol   Carbenicillin Geminisyn Percodan   Carna Arthritis Reliever Geopen Persantine   Carprofen Gold's salt Persistin   Chloramphenicol Goody's Phenylbutazone   Chloromycetin Haltrain Piroxlcam   Clmetidine heparin Plaquenil   Cllnoril  Hyco-pap Ponstel   Clofibrate Hydroxy chloroquine Propoxyphen         Before stopping any of these medications, be sure to consult the physician who ordered them.  Some, such as Coumadin (Warfarin) are ordered to prevent or treat serious conditions such as "deep thrombosis", "pumonary embolisms", and other heart problems.  The amount of time that you may need off of the medication may also vary with the medication and the reason for which you were taking it.  If you are taking any of these medications, please make sure you notify your pain physician before you undergo any procedures.         Occipital Nerve Block Patient Information  Description: The occipital nerves originate in the cervical (neck) spinal cord and travel upward through muscle and tissue to supply sensation to the back of the head and top of  the scalp.  In addition, the nerves control some of the muscles of the scalp.  Occipital neuralgia is an irritation of these nerves which can cause headaches, numbness of the scalp, and neck discomfort.     The occipital nerve block will interrupt nerve transmission through these nerves and can relieve pain and spasm.  The block consists of insertion of a small needle under the skin in the back of the head to deposit local anesthetic (numbing medicine) and/or steroids around the nerve.  The entire block usually lasts less than 5 minutes.  Conditions which may be treated by occipital blocks:   Muscular pain and spasm of the scalp  Nerve irritation, back of the head  Headaches  Upper neck pain  Preparation for the injection:  12. Do not eat any solid food or dairy products within 6 hours of your appointment. 13. You may drink clear liquids up to 2 hours before appointment.  Clear liquids include water, black coffee, juice or soda.  No milk or cream please. 14. You may take your regular medication, including pain medications, with a sip of water before you appointment.  Diabetics  should hold regular insulin (if taken separately) and take 1/2 normal NPH dose the morning of the procedure.  Carry some sugar containing items with you to your appointment. 15. A driver must accompany you and be prepared to drive you home after your procedure. 74. Bring all your current medications with you. 17. An IV may be inserted and sedation may be given at the discretion of the physician. 18. A blood pressure cuff, EKG, and other monitors will often be applied during the procedure.  Some patients may need to have extra oxygen administered for a short period. 99. You will be asked to provide medical information, including your allergies and medications, prior to the procedure.  We must know immediately if you are taking blood thinners (like Coumadin/Warfarin) or if you are allergic to IV iodine contrast (dye).  We must know if you could possible be pregnant.  20. Do not wear a high collared shirt or turtleneck.  Tie long hair up in the back if possible.  Possible side-effects:   Bleeding from needle site  Infection (rare, may require surgery)  Nerve injury (rare)  Hair on back of neck can be tinged with iodine scrub (this will wash out)  Light-headedness (temporary)  Pain at injection site (several days)  Decreased blood pressure (rare, temporary)  Seizure (very rare)  Call if you experience:   Hives or difficulty breathing ( go to the emergency room)  Inflammation or drainage at the injection site(s)  Please note:  Although the local anesthetic injected can often make your painful muscles or headache feel good for several hours after the injection, the pain may return.  It takes 3-7 days for steroids to work.  You may not notice any pain relief for at least one week.  If effective, we will often do a series of injections spaced 3-6 weeks apart to maximally decrease your pain.  If you have any questions, please call [phone] [facility]

## 2014-09-23 ENCOUNTER — Other Ambulatory Visit: Payer: Self-pay | Admitting: Family Medicine

## 2014-09-23 ENCOUNTER — Telehealth: Payer: Self-pay | Admitting: Family Medicine

## 2014-09-23 MED ORDER — CELECOXIB 200 MG PO CAPS: 200.0000 mg | ORAL_CAPSULE | Freq: Every day | ORAL | Status: DC

## 2014-09-23 NOTE — Telephone Encounter (Signed)
Ok to order. Thanks.   

## 2014-09-23 NOTE — Telephone Encounter (Signed)
Ok to order 30 mls four times daily. Thanks.

## 2014-09-23 NOTE — Telephone Encounter (Signed)
Dr Jerilynn Mages, we dont have any record of you prescribing this medication to this patient. I have looked in [employer] as well. You can check behind me but I didn't see it. Looks like it was added as a historical med at [facility].

## 2014-09-23 NOTE — Telephone Encounter (Signed)
Sent in prescriptions into pharmacy. Advised Mandy.

## 2014-09-23 NOTE — Telephone Encounter (Signed)
Mandy with [facility] called states pt has been having lose stools and diarrhea for several weeks. Pt Lexapro was changed to Remeron by Dr Jimmye Norman to help with this but it has not completely resolved.  Leafy Ro is requesting a standing order for Kaopectate daily as needed until resolved.  CB#[phone]/MW

## 2014-09-23 NOTE — Telephone Encounter (Signed)
Pt contacted office for refill request on the following medications:  celecoxib (CELEBREX) 200 MG.  30 day supply sent to [facility] and 90 day supply sent to [facility].  TD#[phone]/[id number]   Sandra Brown is requesting this sent today if possible/MW

## 2014-09-23 NOTE — Telephone Encounter (Signed)
Mandy advised.   Thanks,   -Mickel Baas

## 2014-09-28 ENCOUNTER — Telehealth: Payer: Self-pay | Admitting: Family Medicine

## 2014-09-28 NOTE — Telephone Encounter (Signed)
Leafy Ro would like pt's last OV notes faxed to [phone]. Thanks TNP

## 2014-09-29 NOTE — Telephone Encounter (Signed)
This is completed.   Thanks,   -Rochelle Nephew  

## 2014-10-03 ENCOUNTER — Encounter: Payer: Self-pay | Admitting: Pain Medicine

## 2014-10-03 ENCOUNTER — Ambulatory Visit: Payer: Medicare Other | Attending: Pain Medicine | Admitting: Pain Medicine

## 2014-10-03 VITALS — BP 137/69 | HR 89 | Resp 16

## 2014-10-03 DIAGNOSIS — M25511 Pain in right shoulder: Secondary | ICD-10-CM | POA: Diagnosis not present

## 2014-10-03 DIAGNOSIS — M19011 Primary osteoarthritis, right shoulder: Secondary | ICD-10-CM

## 2014-10-03 DIAGNOSIS — M5030 Other cervical disc degeneration, unspecified cervical region: Secondary | ICD-10-CM | POA: Insufficient documentation

## 2014-10-03 DIAGNOSIS — R51 Headache: Secondary | ICD-10-CM | POA: Insufficient documentation

## 2014-10-03 DIAGNOSIS — M5136 Other intervertebral disc degeneration, lumbar region: Secondary | ICD-10-CM

## 2014-10-03 DIAGNOSIS — M47816 Spondylosis without myelopathy or radiculopathy, lumbar region: Secondary | ICD-10-CM

## 2014-10-03 DIAGNOSIS — M5481 Occipital neuralgia: Secondary | ICD-10-CM | POA: Diagnosis not present

## 2014-10-03 DIAGNOSIS — M542 Cervicalgia: Secondary | ICD-10-CM | POA: Diagnosis present

## 2014-10-03 DIAGNOSIS — M51369 Other intervertebral disc degeneration, lumbar region without mention of lumbar back pain or lower extremity pain: Secondary | ICD-10-CM

## 2014-10-03 DIAGNOSIS — M533 Sacrococcygeal disorders, not elsewhere classified: Secondary | ICD-10-CM

## 2014-10-03 MED ORDER — DOXYCYCLINE HYCLATE 100 MG PO TABS: 100.0000 mg | ORAL_TABLET | Freq: Two times a day (BID) | ORAL | Status: DC

## 2014-10-03 MED ORDER — BUPIVACAINE HCL (PF) 0.25 % IJ SOLN
INTRAMUSCULAR | Status: AC
  Administered 2014-10-03: 13:00:00
  Filled 2014-10-03: qty 30

## 2014-10-03 MED ORDER — MIDAZOLAM HCL 5 MG/5ML IJ SOLN
INTRAMUSCULAR | Status: AC
  Administered 2014-10-03: 1 mg via INTRAVENOUS
  Filled 2014-10-03: qty 5

## 2014-10-03 MED ORDER — TRIAMCINOLONE ACETONIDE 40 MG/ML IJ SUSP
INTRAMUSCULAR | Status: AC
  Administered 2014-10-03: 13:00:00
  Filled 2014-10-03: qty 1

## 2014-10-03 MED ORDER — TRIAMCINOLONE ACETONIDE 40 MG/ML IJ SUSP
INTRAMUSCULAR | Status: AC
  Filled 2014-10-03: qty 1

## 2014-10-03 MED ORDER — ORPHENADRINE CITRATE 30 MG/ML IJ SOLN
INTRAMUSCULAR | Status: AC
  Administered 2014-10-03: 13:00:00
  Filled 2014-10-03: qty 2

## 2014-10-03 MED ORDER — FENTANYL CITRATE (PF) 100 MCG/2ML IJ SOLN
INTRAMUSCULAR | Status: AC
  Administered 2014-10-03: 50 ug via INTRAVENOUS
  Filled 2014-10-03: qty 2

## 2014-10-03 MED ORDER — BUPIVACAINE HCL (PF) 0.25 % IJ SOLN
INTRAMUSCULAR | Status: AC
  Filled 2014-10-03: qty 30

## 2014-10-03 NOTE — Patient Instructions (Addendum)
PLAN   Continue present medication and please get your antibiotic doxycycline and begin taking the antibiotic doxycycline today as prescribed  F/U PCP Dr. Venia Minks for evaliation of  BP and general medical  condition  F/U surgical evaluation. May consider pending follow-up evaluations  F/U neurological evaluation. May consider pending follow-up evaluations  May consider radiofrequency rhizolysis or intraspinal procedures pending response to present treatment and F/U evaluation   Patient to call Pain Management Center should patient have concerns prior to scheduled return appointment. Pain Management Discharge Instructions  General Discharge Instructions :  If you need to reach your doctor call: Monday-Friday 8:00 am - 4:00 pm at [phone] or toll free [phone].  After clinic hours [phone] to have operator reach doctor.  Bring all of your medication bottles to all your appointments in the pain clinic.  To cancel or reschedule your appointment with Pain Management please remember to call 24 hours in advance to avoid a fee.  Refer to the educational materials which you have been given on: General Risks, I had my Procedure. Discharge Instructions, Post Sedation.  Post Procedure Instructions:  The drugs you were given will stay in your system until tomorrow, so for the next 24 hours you should not drive, make any legal decisions or drink any alcoholic beverages.  You may eat anything you prefer, but it is better to start with liquids then soups and crackers, and gradually work up to solid foods.  Please notify your doctor immediately if you have any unusual bleeding, trouble breathing or pain that is not related to your normal pain.  Depending on the type of procedure that was done, some parts of your body may feel week and/or numb.  This usually clears up by tonight or the next day.  Walk with the use of an assistive device or accompanied by an adult for the 24  hours.  You may use ice on the affected area for the first 24 hours.  Put ice in a Ziploc bag and cover with a towel and place against area 15 minutes on 15 minutes off.  You may switch to heat after 24 hours.

## 2014-10-03 NOTE — Progress Notes (Signed)
Safety precautions to be maintained throughout the outpatient stay will include: orient to surroundings, keep bed in low position, maintain call bell within reach at all times, provide assistance with transfer out of bed and ambulation.  

## 2014-10-03 NOTE — Progress Notes (Signed)
   Subjective:    Patient ID: Sandra Brown, female    DOB: 07-05-1926, 81 y.o.   MRN: [id number]  HPI  NOTE: The patient is a 81 y.o.-year-old female who returns to the Pain Management Center for further evaluation and treatment of pain consisting of pain involving the region of the neck and headache.  Patient is with prior studies revealing patient to be with degenerative changes of the cervical spine. The patient is with complaint of pain involving the cervical region and the region of the right shoulder associated with pain radiating from the back of the neck to the back of the head precipitating headaches as well as pain of the right shoulder .  The risks, benefits, and expectations of the procedure have been discussed and explained to patient, who is understanding and wishes to proceed with interventional treatment as discussed and as explained to patient.  Will proceed with greater occipital nerve blocks with myoneural block injections at this time as discussed and as explained to patient.  All are understanding and in agreement with suggested treatment plan.    PROCEDURE:  Greater occipital nerve block on the left side with IV Versed, IV Fentanyl, conscious sedation, EKG, blood pressure, pulse, pulse oximetry monitoring.  Procedure performed with patient in prone position.  Greater occipital nerve block on the left side.   With patient in prone position, Betadine prep of proposed entry site accomplished.  Following identification of the nuchal ridge, 22 -gauge needle was inserted at the level of the nuchal ridge medial to the occipital artery.  Following negative aspiration, 4cc 0.25% bupivacaine with Kenalog injected for left greater occipital nerve block.  Needle was removed.  Patient tolerated injection well.   Greater occipital nerve block on the rightt side. The greater occipital nerve block on the right side was performed exactly as the left greater occipital nerve block was performed  and utilizing the same technique.  Myoneural block injections of the right shoulder Following Betadine prep of proposed entry site a 22-gauge needle was inserted in the region of the right shoulder and following negative aspiration 2 cc of 0.25% bupivacaine with Norflex was injected for myoneural block injections of the right shoulder 4   A total of 10 mg Kenalog was utilized for the entire procedure.  PLAN:    1. Medications: Will continue presently prescribed medications at this time. 2. Patient to follow up with primary care physician Dr. Venia Minks for evaluation of blood pressure and general medical condition status post procedure performed on today's visit. 3. Neurological evaluation for further assessment of headaches and for evaluation of pain of shoulder as discussed. 4. Surgical evaluation as discussed.  5. Patient may be candidate for Botox injections, radiofrequency procedures, as well as implantation type procedures pending response to treatment rendered on today's visit and pending follow-up evaluation. 6. Patient has been advised to adhere to proper body mechanics and to avoid activities which appear to aggravate condition.cations:  Will continue presently prescribed medications at this time. 7. The patient is understanding and in agreement with the suggested treatment plan.     Review of Systems     Objective:   Physical Exam        Assessment & Plan:

## 2014-10-04 ENCOUNTER — Telehealth: Payer: Self-pay | Admitting: Family Medicine

## 2014-10-04 ENCOUNTER — Telehealth: Payer: Self-pay | Admitting: *Deleted

## 2014-10-04 ENCOUNTER — Telehealth: Payer: Self-pay | Admitting: Pain Medicine

## 2014-10-04 DIAGNOSIS — F32A Depression, unspecified: Secondary | ICD-10-CM

## 2014-10-04 DIAGNOSIS — F329 Major depressive disorder, single episode, unspecified: Secondary | ICD-10-CM

## 2014-10-04 NOTE — Telephone Encounter (Signed)
Pt needs refill on her generic xanax .25mg    She uses [facility] [address],  Call back is   [phone]  Thanks Con Memos

## 2014-10-04 NOTE — Telephone Encounter (Signed)
Med was called in by [facility]

## 2014-10-04 NOTE — Telephone Encounter (Signed)
Per daughter patient is doing well, no problems post procedure.

## 2014-10-04 NOTE — Telephone Encounter (Signed)
Still no med called in to [facility] / please call [phone] when med is called in

## 2014-10-05 ENCOUNTER — Other Ambulatory Visit: Payer: Self-pay | Admitting: Family Medicine

## 2014-10-05 MED ORDER — ALPRAZOLAM 0.25 MG PO TABS
0.2500 mg | ORAL_TABLET | Freq: Every day | ORAL | Status: DC | PRN
Start: 2014-10-05 — End: 2014-10-21

## 2014-10-05 NOTE — Telephone Encounter (Signed)
Last OV 06/2014 with [name].  Thanks,   -Mickel Baas

## 2014-10-05 NOTE — Telephone Encounter (Signed)
Please call in rx.  Thanks.  

## 2014-10-07 ENCOUNTER — Ambulatory Visit: Payer: Self-pay | Admitting: Psychiatry

## 2014-10-21 ENCOUNTER — Ambulatory Visit (INDEPENDENT_AMBULATORY_CARE_PROVIDER_SITE_OTHER): Payer: Medicare Other | Admitting: Psychiatry

## 2014-10-21 ENCOUNTER — Encounter: Payer: Self-pay | Admitting: Psychiatry

## 2014-10-21 VITALS — BP 118/72 | HR 93 | Temp 97.5°F | Ht 60.0 in | Wt 133.6 lb

## 2014-10-21 DIAGNOSIS — F329 Major depressive disorder, single episode, unspecified: Secondary | ICD-10-CM | POA: Diagnosis not present

## 2014-10-21 DIAGNOSIS — F32A Depression, unspecified: Secondary | ICD-10-CM

## 2014-10-21 DIAGNOSIS — F331 Major depressive disorder, recurrent, moderate: Secondary | ICD-10-CM

## 2014-10-21 MED ORDER — ALPRAZOLAM 0.25 MG PO TABS: 0.2500 mg | ORAL_TABLET | Freq: Every day | ORAL | Status: DC | PRN

## 2014-10-21 MED ORDER — VENLAFAXINE HCL ER 37.5 MG PO CP24: 37.5000 mg | ORAL_CAPSULE | Freq: Every day | ORAL | Status: DC

## 2014-10-21 NOTE — Progress Notes (Signed)
BH MD/PA/NP OP Progress Note  10/21/2014 12:10 PM BENNIE SCAFF  MRN:  [id number]   Subjective:  Patient returns a follow-up of her major depressive disorder. Patient returns with her daughter. Patient indicates she does feel like she has more energy since being on the Remeron. Daughter also feels like she's been more astute and more proactive in addressing issues such as bills and finances. Patient continues to report GI upset Will she'll have urgency to have a bowel movement. She states she does not necessary characterize it as diarrhea. She reported this problem when we had initiated a trial of Lexapro about 2 months ago and she states it is continued with the Remeron although probably not as bad.  Given that it appears as a lingering GI side effects that started with Lexapro which has been discontinued for over a month and is continuing with the Remeron which is been in place since her last visit we will try another agent. Patient reported past success with several antidepressants including Prozac, Wellbutrin and Effexor. She states she had been on Effexor for a long time. We have decided we will reinitiate this medication at a low dose. This will allow [place] to see whether Remeron continued to cause GI upset issues are perhaps she is having a another cause for her GI issues and that the initiation of new antidepressants just happened to be contemporaneous but not causative.  Writer did obtain the records from the patient's previous psychiatrist. These records ran from 2006 2012. Significant observations were that the patient was largely maintained on Cymbalta and Lamictal. It also appears there were some trials for augmentation with stimulants. Patient had been augmented with Concerta, Metadate and Ritalin. Appears about trial of augmentation with Abilify but the patient had some side effects related to that. There also appears to be in use of Cytomel. He was a note of a trial of Ambien but caused  hallucinations. Chief Complaint: "explosive" bowel movements Chief Complaint    Follow-up; Medication Refill     Visit Diagnosis:     ICD-9-CM ICD-10-CM   1. Major depressive disorder, recurrent episode, moderate (HCC) 296.32 F33.1     Past Medical History:  Past Medical History  Diagnosis Date  . Hearing loss   . Hypertension   . Reflux   . Frequent headaches   . Difficulty swallowing   . Hypothyroidism   . Depression   . Cataract   . COPD (chronic obstructive pulmonary disease) [facility])     Past Surgical History  Procedure Laterality Date  . Total hip arthroplasty      x 4  . Lumbar laminectomy    . Parathyroidectomy    . Appendectomy    . Abdominal hysterectomy     Family History:  Family History  Problem Relation Age of Onset  . Stroke Mother   . Hypertension Mother   . Heart disease Father   . Hypertension Father    Social History:  Social History   Social History  . Marital Status: Widowed    Spouse Name: N/A  . Number of Children: N/A  . Years of Education: N/A   Social History Main Topics  . Smoking status: Never Smoker   . Smokeless tobacco: Never Used     Comment: quit 1954  . Alcohol Use: No  . Drug Use: No  . Sexual Activity: No   Other Topics Concern  . None   Social History Narrative   Additional History:   Assessment:  Musculoskeletal: Strength & Muscle Tone: She ambulates slowly but without any assistance Gait & Station: normal Patient leans: N/A  Psychiatric Specialty Exam: HPI  Review of Systems  Psychiatric/Behavioral: Positive for depression (decreased). Negative for suicidal ideas, hallucinations, memory loss and substance abuse. The patient is not nervous/anxious and does not have insomnia.     Blood pressure 118/72, pulse 93, temperature 97.5 F (36.4 C), temperature source Tympanic, height 5' (1.524 m), weight 133 lb 9.6 oz (60.601 kg), SpO2 95 %.Body mass index is 26.09 kg/(m^2).  General Appearance: Neat and Well  Groomed  Eye Contact:  Good  Speech:  Normal Rate  Volume:  Normal  Mood:  Okay  Affect:  Constricted and Still constricted but slightly more interactive throughout the appointment  Thought Process:  Linear and Logical  Orientation:  Full (Time, Place, and Person)  Thought Content:  Negative  Suicidal Thoughts:  No  Homicidal Thoughts:  No  Memory:  Immediate;   Good Recent;   Good Remote;   Good  Judgement:  Good  Insight:  Good  Psychomotor Activity:  Negative  Concentration:  Good  Recall:  Good  Fund of Knowledge: Good  Language: Good  Akathisia:  Negative  Handed:  Right unknown   AIMS (if indicated):  N/A  Assets:  Communication Skills Desire for Improvement Social Support  ADL's:  Intact  Cognition: WNL  Sleep:  Fair. She goes to bed at 2AM but is able to sleep once asleep   Is the patient at risk to self?  No. Has the patient been a risk to self in the past 6 months?  No. Has the patient been a risk to self within the distant past?  No. Is the patient a risk to others?  No. Has the patient been a risk to others in the past 6 months?  No. Has the patient been a risk to others within the distant past?  No.  Current Medications: Current Outpatient Prescriptions  Medication Sig Dispense Refill  . acetaminophen (TYLENOL) 500 MG tablet Take 500 mg by mouth every 6 (six) hours as needed for moderate pain.    Marland Kitchen ALPRAZolam (XANAX) 0.25 MG tablet Take 1 tablet (0.25 mg total) by mouth daily as needed for anxiety. 30 tablet 5  . CALCIUM PO Take by mouth.    . celecoxib (CELEBREX) 200 MG capsule Take 1 capsule (200 mg total) by mouth daily. 30 capsule 0  . [facility] RANITIDINE 75 MG tablet   3  . dextromethorphan (DELSYM) 30 MG/5ML liquid Take by mouth 2 (two) times daily as needed for cough.    . esomeprazole (NEXIUM) 40 MG capsule Take 40 mg by mouth.    . Ferrous Fumarate 324 (106 FE) MG TABS TAKE 1 TABLET BY MOUTH EVERY DAY(NOT COVERED BY INS) 90 tablet 3  . fluticasone  (FLONASE) 50 MCG/ACT nasal spray 1 spray by Each Nare route daily.    . furosemide (LASIX) 40 MG tablet TAKE 1 TABLET IN THE MORNING 90 tablet 3  . guaiFENesin (MUCINEX) 600 MG 12 hr tablet Take 600 mg by mouth 2 (two) times daily.    Marland Kitchen lamoTRIgine (LAMICTAL) 100 MG tablet TAKE ONE-HALF (1/2) TABLET DAILY 45 tablet 2  . levocetirizine (XYZAL) 5 MG tablet Take 5 mg by mouth as needed for allergies.    Marland Kitchen levothyroxine (SYNTHROID, LEVOTHROID) 50 MCG tablet Take 50 mcg by mouth daily before breakfast.    . Magnesium Oxide 420 MG TABS Take by mouth.    Marland Kitchen  MELATONIN PO Take by mouth daily.    . montelukast (SINGULAIR) 10 MG tablet Take 10 mg by mouth at bedtime.    . Multiple Vitamin (MULTIVITAMIN) tablet Take 1 tablet by mouth daily.    Marland Kitchen nystatin (MYCOSTATIN) 100000 UNIT/ML suspension Take 5 mLs (500,000 Units total) by mouth 4 (four) times daily. 60 mL 0  . potassium chloride (MICRO-K) 10 MEQ CR capsule TAKE 2 CAPSULES DAILY 180 capsule 2  . simvastatin (ZOCOR) 20 MG tablet TAKE 1 TABLET DAILY 90 tablet 3  . SPIRIVA HANDIHALER 18 MCG inhalation capsule INHALE THE CONTENTS OF 1 CAPSULE DAILY 90 capsule 2  . spironolactone (ALDACTONE) 25 MG tablet TAKE 1 TABLET BY MOUTH EVERY DAY 30 tablet 5  . sucralfate (CARAFATE) 1 G tablet TAKE 1 TABLET BY MOUTH 4 TIMES A DAY FOR 7 DAYS THEN TWICE A DAY 60 tablet 5  . tamsulosin (FLOMAX) 0.4 MG CAPS capsule Take 0.4 mg by mouth.    . venlafaxine XR (EFFEXOR XR) 37.5 MG 24 hr capsule Take 1 capsule (37.5 mg total) by mouth daily with breakfast. 30 capsule 1   No current facility-administered medications for this visit.    Medical Decision Making:  Established Problem, Stable/Improving (1) and Review of New Medication or Change in Dosage (2)  Treatment Plan Summary:Medication management and Plan   Major depressive disorder, recurrent, moderate it does appear that the patient had a slight response with the Remeron however there is continued GI upset that was the  case even on low-dose Lexapro which was stopped at the last appointment. Issues such as polypharmacy or perhaps another GI issue might be a factor. Patient has an upcoming appointment with her primary care. This time her to discontinue Remeron and start Effexor XR 37.5 mg daily. She reports previous benefit from this medication. We will observe to see with the GI complaints change in character or intensity.  Ideally if the GI issues are caused by something else Remeron might be something to return to his patient does appear to have had some response. She is also advocating for her her medications and refills which is more interactive than she's been in past appointments.  We will continue her Lamictal 50 mg daily and alprazolam .25 mg QD prn anxiety  for now so where not making multiple changes. Patient states she might use these 3 times a month. Patient will follow up in 1 month.   Faith Rogue 10/21/2014, 12:10 PM

## 2014-10-25 ENCOUNTER — Telehealth: Payer: Self-pay | Admitting: Family Medicine

## 2014-10-25 NOTE — Telephone Encounter (Signed)
Mindy from [facility] stating she did not receive the last OV notes on pt . Please refax to [phone]. CC

## 2014-10-25 NOTE — Telephone Encounter (Signed)
This is done. ° °Thanks,  ° °-Roddie Riegler  °

## 2014-10-26 ENCOUNTER — Other Ambulatory Visit: Payer: Self-pay | Admitting: Family Medicine

## 2014-10-26 DIAGNOSIS — D509 Iron deficiency anemia, unspecified: Secondary | ICD-10-CM

## 2014-11-01 ENCOUNTER — Ambulatory Visit: Payer: Self-pay | Admitting: Pain Medicine

## 2014-11-02 NOTE — Progress Notes (Signed)
lexapro was discontinued because of GI issues and pt is still taking the remeron

## 2014-11-03 ENCOUNTER — Telehealth: Payer: Self-pay | Admitting: Family Medicine

## 2014-11-03 ENCOUNTER — Ambulatory Visit: Payer: Self-pay | Admitting: Pain Medicine

## 2014-11-03 NOTE — Telephone Encounter (Signed)
Debbie with [facility] calling to see about the order that she faxed to [place] yesterday about pt getting her vaccine, Jackelyn Poling states she would like to get this faxed back ASAP please @ [phone]. CC

## 2014-11-03 NOTE — Telephone Encounter (Signed)
I have already signed this. Thanks.

## 2014-11-14 ENCOUNTER — Other Ambulatory Visit: Payer: Self-pay | Admitting: Family Medicine

## 2014-11-14 DIAGNOSIS — J309 Allergic rhinitis, unspecified: Secondary | ICD-10-CM | POA: Insufficient documentation

## 2014-11-15 MED ORDER — FLUTICASONE PROPIONATE 50 MCG/ACT NA SUSP: NASAL | Status: DC

## 2014-11-15 NOTE — Addendum Note (Signed)
Addended by: Ashley Royalty E on: 11/15/2014 08:15 AM   Modules accepted: Orders

## 2014-11-17 DIAGNOSIS — Z23 Encounter for immunization: Secondary | ICD-10-CM | POA: Diagnosis not present

## 2014-11-18 ENCOUNTER — Ambulatory Visit (INDEPENDENT_AMBULATORY_CARE_PROVIDER_SITE_OTHER): Payer: Medicare Other | Admitting: Psychiatry

## 2014-11-18 ENCOUNTER — Encounter: Payer: Self-pay | Admitting: Psychiatry

## 2014-11-18 VITALS — BP 122/72 | HR 85 | Temp 97.7°F | Ht 60.0 in | Wt 131.4 lb

## 2014-11-18 DIAGNOSIS — F331 Major depressive disorder, recurrent, moderate: Secondary | ICD-10-CM | POA: Diagnosis not present

## 2014-11-18 MED ORDER — VENLAFAXINE HCL ER 75 MG PO CP24: 75.0000 mg | ORAL_CAPSULE | ORAL | Status: DC

## 2014-11-18 NOTE — Progress Notes (Signed)
BH MD/PA/NP OP Progress Note  11/18/2014 2:19 PM Sandra Brown  MRN:  [id number]   Subjective:  Patient returns a follow-up of her major depressive disorder. Patient returns with her daughter as she has in all of her previous appointments. Patient indicates she feels like her mood is better. She states that she is doing reading. She states she goes to bed about 2 AM and typically reads at night until the book lands on her head and she is asleep. She feels like the Effexor has been somewhat helpful. She indicated the GI upset that persisted on Lexapro as well as seemed to persist on Remeron has largely subsided. She did state however that 2 nights ago she had an issue with constipation. She states her energy is good. Her daughter feels like she is more engaged. Daughter also indicated that some of the other children at seen her recently and felt like her mood and engagement had improved. Patient indicated that she might wake up depressed about 3 times a week.   We discussed today that perhaps since we were at a low dose of Effexor XR that we would increase it from 30 7/2 mg daily to 75 mg daily. We also discussed trying to decrease the polypharmacy. I indicated that if her depression improves with the Effexor that perhaps down the road we can taper her off her Lamictal. She indicates she has used her alprazolam 2 or 3 times since her last visit 1 month ago.   Writer did obtain the records from the patient's previous psychiatrist. These records ran from 2006 to 2012. Significant observations were that the patient was largely maintained on Cymbalta and Lamictal. It also appears there were some trials for augmentation with stimulants. Patient had been augmented with Concerta, Metadate and Ritalin. Appears about trial of augmentation with Abilify but the patient had some side effects related to that. There also appears to be in use of Cytomel. He was a note of a trial of Ambien but caused  hallucinations. Chief Complaint: "explosive" bowel movements Chief Complaint    Follow-up; Medication Refill     Visit Diagnosis:   No diagnosis found.  Past Medical History:  Past Medical History  Diagnosis Date  . Hearing loss   . Hypertension   . Reflux   . Frequent headaches   . Difficulty swallowing   . Hypothyroidism   . Depression   . Cataract   . COPD (chronic obstructive pulmonary disease) [facility])     Past Surgical History  Procedure Laterality Date  . Total hip arthroplasty      x 4  . Lumbar laminectomy    . Parathyroidectomy    . Appendectomy    . Abdominal hysterectomy     Family History:  Family History  Problem Relation Age of Onset  . Stroke Mother   . Hypertension Mother   . Heart disease Father   . Hypertension Father    Social History:  Social History   Social History  . Marital Status: Widowed    Spouse Name: N/A  . Number of Children: N/A  . Years of Education: N/A   Social History Main Topics  . Smoking status: Never Smoker   . Smokeless tobacco: Never Used     Comment: quit 1954  . Alcohol Use: No  . Drug Use: No  . Sexual Activity: No   Other Topics Concern  . None   Social History Narrative   Additional History:   Assessment:  Musculoskeletal: Strength & Muscle Tone: She ambulates slowly but without any assistance Gait & Station: normal Patient leans: N/A  Psychiatric Specialty Exam: HPI  Review of Systems  Psychiatric/Behavioral: Positive for depression (in tin use to decrease). Negative for suicidal ideas, hallucinations, memory loss and substance abuse. The patient is not nervous/anxious and does not have insomnia.   All other systems reviewed and are negative.   Blood pressure 122/72, pulse 85, temperature 97.7 F (36.5 C), temperature source Tympanic, height 5' (1.524 m), weight 131 lb 6.4 oz (59.603 kg), SpO2 91 %.Body mass index is 25.66 kg/(m^2).  General Appearance: Neat and Well Groomed  Eye Contact:   Good  Speech:  Normal Rate  Volume:  Normal  Mood:  better  Affect:  Constricted and More engaged and more talkative than in prior appointments  Thought Process:  Linear and Logical  Orientation:  Full (Time, Place, and Person)  Thought Content:  Negative  Suicidal Thoughts:  No  Homicidal Thoughts:  No  Memory:  Immediate;   Good Recent;   Good Remote;   Good  Judgement:  Good  Insight:  Good  Psychomotor Activity:  Negative  Concentration:  Good  Recall:  Good  Fund of Knowledge: Good  Language: Good  Akathisia:  Negative  Handed:  Right unknown   AIMS (if indicated):  N/A  Assets:  Communication Skills Desire for Improvement Social Support  ADL's:  Intact  Cognition: WNL  Sleep:  Fair. She goes to bed at 2AM but is able to sleep once asleep   Is the patient at risk to self?  No. Has the patient been a risk to self in the past 6 months?  No. Has the patient been a risk to self within the distant past?  No. Is the patient a risk to others?  No. Has the patient been a risk to others in the past 6 months?  No. Has the patient been a risk to others within the distant past?  No.  Current Medications: Current Outpatient Prescriptions  Medication Sig Dispense Refill  . acetaminophen (TYLENOL) 500 MG tablet Take 500 mg by mouth every 6 (six) hours as needed for moderate pain.    Marland Kitchen ALPRAZolam (XANAX) 0.25 MG tablet Take 1 tablet (0.25 mg total) by mouth daily as needed for anxiety. 30 tablet 1  . CALCIUM PO Take by mouth.    . celecoxib (CELEBREX) 200 MG capsule Take 1 capsule (200 mg total) by mouth daily. 30 capsule 0  . [facility] RANITIDINE 75 MG tablet   3  . dextromethorphan (DELSYM) 30 MG/5ML liquid Take by mouth 2 (two) times daily as needed for cough.    . esomeprazole (NEXIUM) 40 MG capsule Take 40 mg by mouth.    . FERROCITE 324 MG TABS TAKE 1 TABLET BY MOUTH EVERY DAY(NOT COVERED BY INS) 30 tablet 5  . Ferrous Fumarate 324 (106 FE) MG TABS TAKE 1 TABLET BY MOUTH EVERY  DAY(NOT COVERED BY INS) 90 tablet 3  . fluticasone (FLONASE) 50 MCG/ACT nasal spray USE 2 SPRAYS NASALLY DAILY 48 g 2  . furosemide (LASIX) 40 MG tablet TAKE 1 TABLET IN THE MORNING 90 tablet 3  . guaiFENesin (MUCINEX) 600 MG 12 hr tablet Take 600 mg by mouth 2 (two) times daily.    Marland Kitchen lamoTRIgine (LAMICTAL) 100 MG tablet TAKE ONE-HALF (1/2) TABLET DAILY 45 tablet 2  . levocetirizine (XYZAL) 5 MG tablet Take 5 mg by mouth as needed for allergies.    Marland Kitchen  levothyroxine (SYNTHROID, LEVOTHROID) 50 MCG tablet Take 50 mcg by mouth daily before breakfast.    . Magnesium Oxide 420 MG TABS Take by mouth.    . MELATONIN PO Take by mouth daily.    . montelukast (SINGULAIR) 10 MG tablet Take 10 mg by mouth at bedtime.    . Multiple Vitamin (MULTIVITAMIN) tablet Take 1 tablet by mouth daily.    Marland Kitchen nystatin (MYCOSTATIN) 100000 UNIT/ML suspension Take 5 mLs (500,000 Units total) by mouth 4 (four) times daily. 60 mL 0  . potassium chloride (MICRO-K) 10 MEQ CR capsule TAKE 2 CAPSULES DAILY 180 capsule 2  . simvastatin (ZOCOR) 20 MG tablet TAKE 1 TABLET DAILY 90 tablet 3  . SPIRIVA HANDIHALER 18 MCG inhalation capsule INHALE THE CONTENTS OF 1 CAPSULE DAILY 90 capsule 2  . spironolactone (ALDACTONE) 25 MG tablet TAKE 1 TABLET BY MOUTH EVERY DAY 30 tablet 5  . sucralfate (CARAFATE) 1 G tablet TAKE 1 TABLET BY MOUTH 4 TIMES A DAY FOR 7 DAYS THEN TWICE A DAY 60 tablet 5  . tamsulosin (FLOMAX) 0.4 MG CAPS capsule Take 0.4 mg by mouth.    . venlafaxine XR (EFFEXOR-XR) 75 MG 24 hr capsule Take 1 capsule (75 mg total) by mouth every morning. 30 capsule 1   No current facility-administered medications for this visit.    Medical Decision Making:  Established Problem, Stable/Improving (1) and Review of New Medication or Change in Dosage (2)  Treatment Plan Summary:Medication management and Plan   Major depressive disorder, recurrent, moderate. We will increase the patient's Effexor XR from 37.5 mg to 75 mg daily. We will  continue her Lamictal 50 mg daily and alprazolam .25 mg QD prn anxiety  for now so where not making multiple changes. At the next visit we may explore tapering the Lamictal down to 25 mg daily and then eventually discontinuing it.  Patient will follow up in 1 month.   Faith Rogue 11/18/2014, 2:19 PM

## 2014-11-22 ENCOUNTER — Telehealth: Payer: Self-pay | Admitting: Family Medicine

## 2014-11-22 NOTE — Telephone Encounter (Signed)
Pt's daughter [name] would like to know if Dr. Venia Minks could work pt in tomorrow at 3:30 or later. Pt called [name] this afternoon and b/c she thinks she might have a kidney infections. I asked what pt's symptoms were and she stated pt told her she hurts around her kidneys. Can pt be worked in? Please advise. Thanks TNP

## 2014-11-22 NOTE — Telephone Encounter (Signed)
Tomorrow at 3:45. Thanks. Left message for [name].

## 2014-11-23 ENCOUNTER — Ambulatory Visit (INDEPENDENT_AMBULATORY_CARE_PROVIDER_SITE_OTHER): Payer: Medicare Other | Admitting: Family Medicine

## 2014-11-23 ENCOUNTER — Encounter: Payer: Self-pay | Admitting: Family Medicine

## 2014-11-23 VITALS — BP 112/60 | HR 100 | Temp 97.7°F | Resp 20 | Wt 132.0 lb

## 2014-11-23 DIAGNOSIS — R600 Localized edema: Secondary | ICD-10-CM

## 2014-11-23 DIAGNOSIS — R109 Unspecified abdominal pain: Secondary | ICD-10-CM

## 2014-11-23 MED ORDER — ACETAMINOPHEN 500 MG PO TABS: 500.0000 mg | ORAL_TABLET | Freq: Four times a day (QID) | ORAL | Status: DC | PRN

## 2014-11-23 NOTE — Progress Notes (Signed)
Subjective:    Patient ID: Sandra Brown, female    DOB: May 21, 1926, 81 y.o.   MRN: [id number]  Urinary Tract Infection  This is a new problem. The current episode started in the past 7 days (back pain x 1 week). The problem occurs intermittently. The problem has been unchanged. The quality of the pain is described as aching (in the right flank area). The pain is at a severity of 4/10. The pain is mild. There has been no fever. Associated symptoms include flank pain (right), hesitancy (pt's baseline), nausea and urgency. Pertinent negatives include no chills, discharge, frequency, hematuria, possible pregnancy, sweats or vomiting. There is no history of recurrent UTIs.  Pt's main complaint today is right flank pain.  Noticed the pain after she fell into her walker, about a week ago.  No bruising.     Also has pain with washing under her right arm to wash under her left.  Does not notice it any other time.      Review of Systems  Constitutional: Negative for chills.  Gastrointestinal: Positive for nausea. Negative for vomiting.  Genitourinary: Positive for hesitancy (pt's baseline), urgency, flank pain (right) and enuresis (pt's baseline). Negative for frequency and hematuria.  Musculoskeletal: Positive for back pain.   BP 112/60 mmHg  Pulse 100  Temp(Src) 97.7 F (36.5 C) (Oral)  Resp 20  Wt 132 lb (59.875 kg)   Patient Active Problem List   Diagnosis Date Noted  . Allergic rhinitis 11/14/2014  . DDD (degenerative disc disease), lumbar 09/21/2014  . Facet syndrome, lumbar 09/21/2014  . Sacroiliac joint dysfunction 09/21/2014  . Greater trochanteric bursitis 09/21/2014  . Hyperlipemia 09/01/2014  . Depression 08/29/2014  . Leg edema 08/29/2014  . Iron deficiency anemia 07/14/2014  . GERD (gastroesophageal reflux disease) 07/14/2014  . Tinea corporis 07/12/2014  . Thrush 07/12/2014  . Dyspnea 07/06/2014  . Recurrent respiratory infection 07/06/2014  . COPD (chronic  obstructive pulmonary disease) ([place]) 06/24/2014  . Hypokalemia 06/17/2014  . Incomplete bladder emptying 12/09/2012  . Urge incontinence 12/09/2012  . FOM (frequency of micturition) 12/09/2012  . Obstruction of urinary tract 12/09/2012  . Basal cell carcinoma of face 08/20/2010   Past Medical History  Diagnosis Date  . Hearing loss   . Hypertension   . Reflux   . Frequent headaches   . Difficulty swallowing   . Hypothyroidism   . Depression   . Cataract   . COPD (chronic obstructive pulmonary disease) ([place])    Current Outpatient Prescriptions on File Prior to Visit  Medication Sig  . acetaminophen (TYLENOL) 500 MG tablet Take 500 mg by mouth every 6 (six) hours as needed for moderate pain.  Marland Kitchen ALPRAZolam (XANAX) 0.25 MG tablet Take 1 tablet (0.25 mg total) by mouth daily as needed for anxiety.  Marland Kitchen CALCIUM PO Take by mouth.  . celecoxib (CELEBREX) 200 MG capsule Take 1 capsule (200 mg total) by mouth daily.  . [facility] RANITIDINE 75 MG tablet   . dextromethorphan (DELSYM) 30 MG/5ML liquid Take by mouth 2 (two) times daily as needed for cough.  . esomeprazole (NEXIUM) 40 MG capsule Take 40 mg by mouth.  . FERROCITE 324 MG TABS TAKE 1 TABLET BY MOUTH EVERY DAY(NOT COVERED BY INS)  . Ferrous Fumarate 324 (106 FE) MG TABS TAKE 1 TABLET BY MOUTH EVERY DAY(NOT COVERED BY INS)  . fluticasone (FLONASE) 50 MCG/ACT nasal spray USE 2 SPRAYS NASALLY DAILY  . furosemide (LASIX) 40 MG tablet TAKE 1 TABLET  IN THE MORNING  . guaiFENesin (MUCINEX) 600 MG 12 hr tablet Take 600 mg by mouth 2 (two) times daily.  Marland Kitchen lamoTRIgine (LAMICTAL) 100 MG tablet TAKE ONE-HALF (1/2) TABLET DAILY  . levocetirizine (XYZAL) 5 MG tablet Take 5 mg by mouth as needed for allergies.  Marland Kitchen levothyroxine (SYNTHROID, LEVOTHROID) 50 MCG tablet Take 50 mcg by mouth daily before breakfast.  . Magnesium Oxide 420 MG TABS Take by mouth.  . MELATONIN PO Take by mouth daily.  . montelukast (SINGULAIR) 10 MG tablet Take 10 mg by mouth at  bedtime.  . Multiple Vitamin (MULTIVITAMIN) tablet Take 1 tablet by mouth daily.  Marland Kitchen nystatin (MYCOSTATIN) 100000 UNIT/ML suspension Take 5 mLs (500,000 Units total) by mouth 4 (four) times daily.  . potassium chloride (MICRO-K) 10 MEQ CR capsule TAKE 2 CAPSULES DAILY  . simvastatin (ZOCOR) 20 MG tablet TAKE 1 TABLET DAILY  . SPIRIVA HANDIHALER 18 MCG inhalation capsule INHALE THE CONTENTS OF 1 CAPSULE DAILY  . spironolactone (ALDACTONE) 25 MG tablet TAKE 1 TABLET BY MOUTH EVERY DAY  . sucralfate (CARAFATE) 1 G tablet TAKE 1 TABLET BY MOUTH 4 TIMES A DAY FOR 7 DAYS THEN TWICE A DAY  . tamsulosin (FLOMAX) 0.4 MG CAPS capsule Take 0.4 mg by mouth.  . venlafaxine XR (EFFEXOR-XR) 75 MG 24 hr capsule Take 1 capsule (75 mg total) by mouth every morning.   No current facility-administered medications on file prior to visit.   Allergies  Allergen Reactions  . Sulfa Antibiotics Rash and Itching    Other reaction(s): Diarrhea and vomiting (finding)  . Erythromycin Nausea And Vomiting    Other reaction(s): Diarrhea and vomiting (finding)  . Aspirin Other (See Comments), Tinitus and Nausea And Vomiting    Ringing of the ears, caused hearing loss both ears Ringing in ears  . Contrast Media [Iodinated Diagnostic Agents] Rash and Hives  . Latex Rash and Itching  . Penicillin G Rash  . Penicillins Rash    Other reaction(s): UNKNOWN  . Tape Rash    Other reaction(s): UNKNOWN Adhesive   Past Surgical History  Procedure Laterality Date  . Total hip arthroplasty      x 4  . Lumbar laminectomy    . Parathyroidectomy    . Appendectomy    . Abdominal hysterectomy     Social History   Social History  . Marital Status: Widowed    Spouse Name: N/A  . Number of Children: N/A  . Years of Education: N/A   Occupational History  . Not on file.   Social History Main Topics  . Smoking status: Never Smoker   . Smokeless tobacco: Never Used     Comment: quit 1954  . Alcohol Use: No  . Drug Use:  No  . Sexual Activity: No   Other Topics Concern  . Not on file   Social History Narrative   Family History  Problem Relation Age of Onset  . Stroke Mother   . Hypertension Mother   . Heart disease Father   . Hypertension Father       Objective:   Physical Exam  Constitutional: She is oriented to person, place, and time. She appears well-developed and well-nourished.  Cardiovascular: Normal rate and regular rhythm.   Pulmonary/Chest: Effort normal and breath sounds normal.  Musculoskeletal:  Tender in right flank.    Neurological: She is alert and oriented to person, place, and time.  Psychiatric: She has a normal mood and affect. Her behavior is normal.  Judgment and thought content normal.   BP 112/60 mmHg  Pulse 100  Temp(Src) 97.7 F (36.5 C) (Oral)  Resp 20  Wt 132 lb (59.875 kg)      Assessment & Plan:  1. Right flank pain New problem. Suspect related to a fall.  - acetaminophen (TYLENOL) 500 MG tablet; Take 1 tablet (500 mg total) by mouth every 6 (six) hours as needed for moderate pain. Also give 500 mg three times a day for 7  Days.  Dispense: 60 tablet; Refill: 0  2. Bilateral edema of lower extremity Stable.Continue current medication and plan of care.   Margarita Rana, MD  - CBC With Differential - Comprehensive Metabolic Panel (CMET)

## 2014-11-24 DIAGNOSIS — R600 Localized edema: Secondary | ICD-10-CM | POA: Diagnosis not present

## 2014-11-25 ENCOUNTER — Telehealth: Payer: Self-pay

## 2014-11-25 DIAGNOSIS — R600 Localized edema: Secondary | ICD-10-CM

## 2014-11-25 LAB — CBC WITH DIFFERENTIAL
Basophils Absolute: 0.1 10*3/uL (ref 0.0–0.2)
Basos: 1 %
EOS (ABSOLUTE): 0.2 10*3/uL (ref 0.0–0.4)
EOS: 3 %
HEMATOCRIT: 38.4 % (ref 34.0–46.6)
Hemoglobin: 12.6 g/dL (ref 11.1–15.9)
Immature Grans (Abs): 0 10*3/uL (ref 0.0–0.1)
Immature Granulocytes: 0 %
Lymphocytes Absolute: 1.9 10*3/uL (ref 0.7–3.1)
Lymphs: 23 %
MCH: 30 pg (ref 26.6–33.0)
MCHC: 32.8 g/dL (ref 31.5–35.7)
MCV: 91 fL (ref 79–97)
Monocytes Absolute: 0.5 10*3/uL (ref 0.1–0.9)
Monocytes: 6 %
NEUTROS ABS: 5.4 10*3/uL (ref 1.4–7.0)
Neutrophils: 67 %
RBC: 4.2 x10E6/uL (ref 3.77–5.28)
RDW: 14.4 % (ref 12.3–15.4)
WBC: 8 10*3/uL (ref 3.4–10.8)

## 2014-11-25 LAB — COMPREHENSIVE METABOLIC PANEL
A/G RATIO: 1.9 (ref 1.1–2.5)
ALT: 11 IU/L (ref 0–32)
AST: 20 IU/L (ref 0–40)
Albumin: 4.4 g/dL (ref 3.5–4.7)
Alkaline Phosphatase: 127 IU/L — ABNORMAL HIGH (ref 39–117)
BILIRUBIN TOTAL: 0.5 mg/dL (ref 0.0–1.2)
BUN/Creatinine Ratio: 19 (ref 11–26)
BUN: 23 mg/dL (ref 8–27)
CALCIUM: 9.6 mg/dL (ref 8.7–10.3)
CO2: 26 mmol/L (ref 18–29)
Chloride: 105 mmol/L (ref 97–106)
Creatinine, Ser: 1.23 mg/dL — ABNORMAL HIGH (ref 0.57–1.00)
GFR calc Af Amer: 46 mL/min/{1.73_m2} — ABNORMAL LOW (ref >59)
GFR calc non Af Amer: 40 mL/min/{1.73_m2} — ABNORMAL LOW (ref >59)
Globulin, Total: 2.3 g/dL (ref 1.5–4.5)
Glucose: 90 mg/dL (ref 65–99)
POTASSIUM: 5.6 mmol/L — AB (ref 3.5–5.2)
SODIUM: 147 mmol/L — AB (ref 136–144)
Total Protein: 6.7 g/dL (ref 6.0–8.5)

## 2014-11-25 NOTE — Telephone Encounter (Signed)
Yes I will write this so it can be faxed over.  Thanks!

## 2014-11-25 NOTE — Telephone Encounter (Signed)
Fax number is [phone]

## 2014-11-25 NOTE — Telephone Encounter (Signed)
Mandy from [facility] and states Dr. Venia Minks advised patient's daughter that potasium needs to be stopped for now due to her levels but Leafy Ro needs that in written and faxed over, please review for her. Dr .Venia Minks is out of the office this afternoon. Thank you-aa

## 2014-11-25 NOTE — Telephone Encounter (Signed)
Will order and fax lab slip to [facility] next week.

## 2014-11-25 NOTE — Telephone Encounter (Signed)
Ok to order. Thanks.   

## 2014-11-25 NOTE — Telephone Encounter (Signed)
[name] asked if we could fax lab order to [facility]? Renaldo Fiddler, CMA

## 2014-11-25 NOTE — Telephone Encounter (Signed)
Done-aa 

## 2014-11-25 NOTE — Telephone Encounter (Signed)
-----   Message from Margarita Rana, MD sent at 11/25/2014 10:05 AM EST ----- Labs stable except for high potassium. Stop potassium supplement and recheck next Tuesday.  Thanks.

## 2014-11-28 ENCOUNTER — Ambulatory Visit: Payer: Medicare Other

## 2014-11-29 ENCOUNTER — Other Ambulatory Visit: Payer: Self-pay | Admitting: Family Medicine

## 2014-11-29 DIAGNOSIS — E039 Hypothyroidism, unspecified: Secondary | ICD-10-CM

## 2014-12-07 ENCOUNTER — Ambulatory Visit: Payer: Medicare Other | Admitting: Podiatry

## 2014-12-12 ENCOUNTER — Ambulatory Visit (INDEPENDENT_AMBULATORY_CARE_PROVIDER_SITE_OTHER): Payer: Medicare Other | Admitting: Podiatry

## 2014-12-12 ENCOUNTER — Encounter: Payer: Self-pay | Admitting: Podiatry

## 2014-12-12 ENCOUNTER — Telehealth: Payer: Self-pay | Admitting: Family Medicine

## 2014-12-12 DIAGNOSIS — B351 Tinea unguium: Secondary | ICD-10-CM | POA: Diagnosis not present

## 2014-12-12 DIAGNOSIS — M79676 Pain in unspecified toe(s): Secondary | ICD-10-CM | POA: Diagnosis not present

## 2014-12-12 DIAGNOSIS — R899 Unspecified abnormal finding in specimens from other organs, systems and tissues: Secondary | ICD-10-CM

## 2014-12-12 NOTE — Telephone Encounter (Signed)
Recheck met c. Thanks.

## 2014-12-12 NOTE — Progress Notes (Signed)
She presents today with chief complaint of painful elongated toenails.  Objective: Pulses are strongly palpable. Nails are thick yellow dystrophic onychomycotic. No open wounds or lesions. Hammertoe deformities are noted.  Assessment: Limb secondary to onychomycosis 1 through 5 bilateral.  Plan: Debridement of nails 1 through 5.

## 2014-12-12 NOTE — Telephone Encounter (Signed)
Mandy cvalled from [facility] needing an order fora recheck on her pot. Level .  Please fax to [phone]-  Call back to [facility] [phone]  Thank sTeri

## 2014-12-13 NOTE — Telephone Encounter (Signed)
Ordered test as below. Faxed to number listed.

## 2014-12-15 ENCOUNTER — Other Ambulatory Visit: Payer: Self-pay | Admitting: Family Medicine

## 2014-12-15 DIAGNOSIS — E875 Hyperkalemia: Secondary | ICD-10-CM | POA: Diagnosis not present

## 2014-12-15 DIAGNOSIS — K589 Irritable bowel syndrome without diarrhea: Secondary | ICD-10-CM | POA: Insufficient documentation

## 2014-12-16 ENCOUNTER — Ambulatory Visit (INDEPENDENT_AMBULATORY_CARE_PROVIDER_SITE_OTHER): Payer: Medicare Other | Admitting: Psychiatry

## 2014-12-16 ENCOUNTER — Encounter: Payer: Self-pay | Admitting: Psychiatry

## 2014-12-16 VITALS — BP 121/77 | HR 95 | Resp 14 | Ht 60.0 in

## 2014-12-16 DIAGNOSIS — F329 Major depressive disorder, single episode, unspecified: Secondary | ICD-10-CM

## 2014-12-16 DIAGNOSIS — F32A Depression, unspecified: Secondary | ICD-10-CM

## 2014-12-16 MED ORDER — ALPRAZOLAM 0.25 MG PO TABS
0.2500 mg | ORAL_TABLET | Freq: Every day | ORAL | Status: DC | PRN
Start: 2014-12-16 — End: 2016-02-29

## 2014-12-16 MED ORDER — VENLAFAXINE HCL ER 75 MG PO CP24: 75.0000 mg | ORAL_CAPSULE | ORAL | Status: DC

## 2014-12-16 NOTE — Progress Notes (Signed)
BH MD/PA/NP OP Progress Note  12/16/2014 2:26 PM Sandra Brown  MRN:  [id number]   Subjective:  Patient returns a follow-up of her major depressive disorder. In contrast to her initial appointment patient does most of the talking today. She indicates she does feel better. She states she is not feeling down as often as she used to. She states that it has occurred a few times. She states she seems to enjoy the events at her assisted living. She states they go out to eat once a month and she enjoys when they ride in the [place] out in the country.  We have previous discussed that we would discontinue the Lamictal just because of polypharmacy and because she is getting a response to the Effexor XR. At this point we are going to go and discontinue it. She continues to use how her alprazolam very sparingly. She states she's used it 3 times since her last appointment one month ago.   On relates patient is doing well and states that typically at this time the year, the holidays the patient is often down and that does not seem to be the case as much this year. Patient plans to put dissipate with her daughter and granddaughters and some holiday decorating today.  Chief Complaint: I'm doing all right  Visit Diagnosis:     ICD-9-CM ICD-10-CM   1. Depression 311 F32.9 ALPRAZolam (XANAX) 0.25 MG tablet    Past Medical History:  Past Medical History  Diagnosis Date  . Hearing loss   . Hypertension   . Reflux   . Frequent headaches   . Difficulty swallowing   . Hypothyroidism   . Depression   . Cataract   . COPD (chronic obstructive pulmonary disease) [facility])     Past Surgical History  Procedure Laterality Date  . Total hip arthroplasty      x 4  . Lumbar laminectomy    . Parathyroidectomy    . Appendectomy    . Abdominal hysterectomy     Family History:  Family History  Problem Relation Age of Onset  . Stroke Mother   . Hypertension Mother   . Heart disease Father   . Hypertension Father     Social History:  Social History   Social History  . Marital Status: Widowed    Spouse Name: N/A  . Number of Children: N/A  . Years of Education: N/A   Social History Main Topics  . Smoking status: Never Smoker   . Smokeless tobacco: Never Used     Comment: quit 1954  . Alcohol Use: No  . Drug Use: No  . Sexual Activity: No   Other Topics Concern  . Not on file   Social History Narrative   Additional History:   Assessment:   Musculoskeletal: Strength & Muscle Tone: She ambulates slowly but without any assistance Gait & Station: normal Patient leans: N/A  Psychiatric Specialty Exam: HPI  Review of Systems  Psychiatric/Behavioral: Negative for depression, suicidal ideas, hallucinations, memory loss and substance abuse. The patient is not nervous/anxious and does not have insomnia.   All other systems reviewed and are negative.   There were no vitals taken for this visit.There is no weight on file to calculate BMI.  General Appearance: Neat and Well Groomed  Eye Contact:  Good  Speech:  Normal Rate  Volume:  Normal  Mood:  better  Affect:  bright, joked that she would forgive this Probation officer for departing the clinic, smiling  Thought  Process:  Linear and Logical  Orientation:  Full (Time, Place, and Person)  Thought Content:  Negative  Suicidal Thoughts:  No  Homicidal Thoughts:  No  Memory:  Immediate;   Good Recent;   Good Remote;   Good  Judgement:  Good  Insight:  Good  Psychomotor Activity:  Negative  Concentration:  Good  Recall:  Good  Fund of Knowledge: Good  Language: Good  Akathisia:  Negative  Handed:  Right unknown   AIMS (if indicated):  N/A  Assets:  Communication Skills Desire for Improvement Social Support  ADL's:  Intact  Cognition: WNL  Sleep:  Fair. She goes to bed at 2AM but is able to sleep once asleep   Is the patient at risk to self?  No. Has the patient been a risk to self in the past 6 months?  No. Has the patient been a  risk to self within the distant past?  No. Is the patient a risk to others?  No. Has the patient been a risk to others in the past 6 months?  No. Has the patient been a risk to others within the distant past?  No.  Current Medications: Current Outpatient Prescriptions  Medication Sig Dispense Refill  . acetaminophen (TYLENOL) 500 MG tablet Take 1 tablet (500 mg total) by mouth every 6 (six) hours as needed for moderate pain. Also give 500 mg three times a day for 7  Days. 60 tablet 0  . ALPRAZolam (XANAX) 0.25 MG tablet Take 1 tablet (0.25 mg total) by mouth daily as needed for anxiety. 30 tablet 3  . CALCIUM PO Take by mouth.    . celecoxib (CELEBREX) 200 MG capsule Take 1 capsule (200 mg total) by mouth daily. 30 capsule 0  . [facility]S RANITIDINE 75 MG tablet   3  . dextromethorphan (DELSYM) 30 MG/5ML liquid Take by mouth 2 (two) times daily as needed for cough.    . esomeprazole (NEXIUM) 40 MG capsule Take 40 mg by mouth.    . FERROCITE 324 MG TABS TAKE 1 TABLET BY MOUTH EVERY DAY(NOT COVERED BY INS) 30 tablet 5  . Ferrous Fumarate 324 (106 FE) MG TABS TAKE 1 TABLET BY MOUTH EVERY DAY(NOT COVERED BY INS) 90 tablet 3  . fluticasone (FLONASE) 50 MCG/ACT nasal spray USE 2 SPRAYS NASALLY DAILY 48 g 2  . furosemide (LASIX) 40 MG tablet TAKE 1 TABLET IN THE MORNING 90 tablet 3  . guaiFENesin (MUCINEX) 600 MG 12 hr tablet Take 600 mg by mouth 2 (two) times daily.    Marland Kitchen levocetirizine (XYZAL) 5 MG tablet Take 5 mg by mouth as needed for allergies.    Marland Kitchen levothyroxine (SYNTHROID, LEVOTHROID) 50 MCG tablet TAKE 1 TABLET DAILY 90 tablet 2  . Magnesium Oxide 420 MG TABS Take by mouth.    . MELATONIN PO Take by mouth daily.    . montelukast (SINGULAIR) 10 MG tablet Take 10 mg by mouth at bedtime.    . Multiple Vitamin (MULTIVITAMIN) tablet Take 1 tablet by mouth daily.    Marland Kitchen nystatin (MYCOSTATIN) 100000 UNIT/ML suspension Take 5 mLs (500,000 Units total) by mouth 4 (four) times daily. 60 mL 0  . OSCIMIN  0.125 MG tablet TAKE 1 TABLET TWICE A DAY 180 tablet 1  . potassium chloride (MICRO-K) 10 MEQ CR capsule TAKE 2 CAPSULES DAILY 180 capsule 2  . simvastatin (ZOCOR) 20 MG tablet TAKE 1 TABLET DAILY 90 tablet 3  . SPIRIVA HANDIHALER 18 MCG inhalation capsule  INHALE THE CONTENTS OF 1 CAPSULE DAILY 90 capsule 2  . spironolactone (ALDACTONE) 25 MG tablet TAKE 1 TABLET BY MOUTH EVERY DAY 30 tablet 5  . sucralfate (CARAFATE) 1 G tablet TAKE 1 TABLET BY MOUTH 4 TIMES A DAY FOR 7 DAYS THEN TWICE A DAY 60 tablet 5  . tamsulosin (FLOMAX) 0.4 MG CAPS capsule Take 0.4 mg by mouth.    . venlafaxine XR (EFFEXOR-XR) 75 MG 24 hr capsule Take 1 capsule (75 mg total) by mouth every morning. 90 capsule 1   No current facility-administered medications for this visit.    Medical Decision Making:  Established Problem, Stable/Improving (1) and Review of New Medication or Change in Dosage (2)  Treatment Plan Summary:Medication management and Plan   Major depressive disorder, recurrent, moderate. He is doing well on the Effexor and this will continue Effexor XR 75 mg daily. Continue  alprazolam .25 mg QD prn anxiety. He has used her last prescription or alprazolam 3 times in the past month. Regarding her discontinue the lamotrigine.  Patient will follow up in 1 month. I've discussed with patient my departure from the clinic in favor 2016. I've made patient daughter aware that there will be another psychiatrist here to continue follow-up.   Faith Rogue 12/16/2014, 2:26 PM

## 2014-12-22 ENCOUNTER — Other Ambulatory Visit: Payer: Self-pay | Admitting: Family Medicine

## 2014-12-22 MED ORDER — TAMSULOSIN HCL 0.4 MG PO CAPS: 0.4000 mg | ORAL_CAPSULE | Freq: Every day | ORAL | Status: DC

## 2014-12-22 NOTE — Telephone Encounter (Signed)
Pt contacted office for refill request on the following medications:  tamsulosin (FLOMAX) 0.4 MG CAPS capsule.  [facility].  [phone]

## 2014-12-23 DIAGNOSIS — Z1211 Encounter for screening for malignant neoplasm of colon: Secondary | ICD-10-CM | POA: Diagnosis not present

## 2014-12-23 DIAGNOSIS — Z1231 Encounter for screening mammogram for malignant neoplasm of breast: Secondary | ICD-10-CM | POA: Diagnosis not present

## 2014-12-23 DIAGNOSIS — Z01419 Encounter for gynecological examination (general) (routine) without abnormal findings: Secondary | ICD-10-CM | POA: Diagnosis not present

## 2015-01-04 NOTE — Progress Notes (Signed)
Refilled- reorder

## 2015-01-06 ENCOUNTER — Telehealth: Payer: Self-pay | Admitting: Family Medicine

## 2015-01-06 NOTE — Telephone Encounter (Signed)
Please advise. Emily Drozdowski, CMA  

## 2015-01-06 NOTE — Telephone Encounter (Signed)
Schedule appointment. Thanks!

## 2015-01-06 NOTE — Telephone Encounter (Signed)
Appointment scheduled for 01/11/2015/MW

## 2015-01-06 NOTE — Telephone Encounter (Signed)
Mandy with [facility] called and left a voice mail message states pt is having diarrhea stools on and off for several months.  She is asking for advise as to what is causing this and asking if pt will need an appointment with Dr Venia Minks.  CB#[phone]/MW

## 2015-01-09 ENCOUNTER — Other Ambulatory Visit: Payer: Self-pay | Admitting: Family Medicine

## 2015-01-11 ENCOUNTER — Ambulatory Visit: Payer: Self-pay | Admitting: Family Medicine

## 2015-01-13 ENCOUNTER — Ambulatory Visit (INDEPENDENT_AMBULATORY_CARE_PROVIDER_SITE_OTHER): Payer: Medicare Other | Admitting: Family Medicine

## 2015-01-13 ENCOUNTER — Encounter: Payer: Self-pay | Admitting: Family Medicine

## 2015-01-13 VITALS — BP 116/60 | HR 80 | Temp 97.7°F | Resp 16 | Wt 128.0 lb

## 2015-01-13 DIAGNOSIS — R197 Diarrhea, unspecified: Secondary | ICD-10-CM | POA: Diagnosis not present

## 2015-01-13 NOTE — Progress Notes (Signed)
Patient ID: Sandra Brown, female   DOB: April 03, 1926, 82 y.o.   MRN: [id number]          [name] Female    DOB: Jul 09, 1926   82 y.o.   MRN: [id number] Visit Date: 01/13/2015  Today's Provider: Margarita Rana, MD   Chief Complaint  Patient presents with  . Diarrhea   Subjective:    Diarrhea  This is a chronic problem. The patient states that diarrhea does not awaken her from sleep. Pertinent negatives include no abdominal pain, bloating, chills, fever, headaches, URI or vomiting. Nothing aggravates the symptoms. She has tried anti-motility drug (Takes kaopectate for it and it helps. Happens iintermiitently in a month.  Happens about 5 days.  ) for the symptoms. The treatment provided significant relief. There is no history of bowel resection, inflammatory bowel disease, a recent abdominal surgery or short gut syndrome.   Previously thought related to medication, but continued after medication changes.       Allergies  Allergen Reactions  . Sulfa Antibiotics Rash and Itching    Other reaction(s): Diarrhea and vomiting (finding)  . Erythromycin Nausea And Vomiting    Other reaction(s): Diarrhea and vomiting (finding)  . Aspirin Other (See Comments), Tinitus and Nausea And Vomiting    Ringing of the ears, caused hearing loss both ears Ringing in ears  . Contrast Media [Iodinated Diagnostic Agents] Rash and Hives  . Latex Rash and Itching  . Penicillin G Rash  . Penicillins Rash    Other reaction(s): UNKNOWN  . Tape Rash    Other reaction(s): UNKNOWN Adhesive   Previous Medications   ACETAMINOPHEN (TYLENOL) 500 MG TABLET    Take 1 tablet (500 mg total) by mouth every 6 (six) hours as needed for moderate pain. Also give 500 mg three times a day for 7  Days.   ALPRAZOLAM (XANAX) 0.25 MG TABLET    Take 1 tablet (0.25 mg total) by mouth daily as needed for anxiety.   CALCIUM PO    Take by mouth.   CELECOXIB (CELEBREX) 200 MG CAPSULE    Take 1 capsule (200 mg  total) by mouth daily.   [facility] RANITIDINE 75 MG TABLET       DEXTROMETHORPHAN (DELSYM) 30 MG/5ML LIQUID    Take by mouth 2 (two) times daily as needed for cough.   ESOMEPRAZOLE (NEXIUM) 40 MG CAPSULE    Take 40 mg by mouth.   FERROCITE 324 MG TABS    TAKE 1 TABLET BY MOUTH EVERY DAY(NOT COVERED BY INS)   FERROUS FUMARATE 324 (106 FE) MG TABS    TAKE 1 TABLET BY MOUTH EVERY DAY(NOT COVERED BY INS)   FLUTICASONE (FLONASE) 50 MCG/ACT NASAL SPRAY    USE 2 SPRAYS NASALLY DAILY   FUROSEMIDE (LASIX) 40 MG TABLET    TAKE 1 TABLET IN THE MORNING   GUAIFENESIN (MUCINEX) 600 MG 12 HR TABLET    Take 600 mg by mouth 2 (two) times daily.   LEVOCETIRIZINE (XYZAL) 5 MG TABLET    Take 5 mg by mouth as needed for allergies.   LEVOTHYROXINE (SYNTHROID, LEVOTHROID) 50 MCG TABLET    TAKE 1 TABLET DAILY   MAGNESIUM OXIDE 420 MG TABS    Take by mouth.   MELATONIN PO    Take by mouth daily.   MONTELUKAST (SINGULAIR) 10 MG TABLET    TAKE 1 TABLET DAILY   MULTIPLE VITAMIN (MULTIVITAMIN) TABLET    Take 1 tablet by mouth daily.  NYSTATIN (MYCOSTATIN) 100000 UNIT/ML SUSPENSION    Take 5 mLs (500,000 Units total) by mouth 4 (four) times daily.   OSCIMIN 0.125 MG TABLET    TAKE 1 TABLET TWICE A DAY   POTASSIUM CHLORIDE (MICRO-K) 10 MEQ CR CAPSULE    TAKE 2 CAPSULES DAILY   SIMVASTATIN (ZOCOR) 20 MG TABLET    TAKE 1 TABLET DAILY   SPIRIVA HANDIHALER 18 MCG INHALATION CAPSULE    INHALE THE CONTENTS OF 1 CAPSULE DAILY   SPIRONOLACTONE (ALDACTONE) 25 MG TABLET    TAKE 1 TABLET BY MOUTH EVERY DAY   SUCRALFATE (CARAFATE) 1 G TABLET    TAKE 1 TABLET BY MOUTH 4 TIMES A DAY FOR 7 DAYS THEN TWICE A DAY   TAMSULOSIN (FLOMAX) 0.4 MG CAPS CAPSULE    Take 1 capsule (0.4 mg total) by mouth daily.   VENLAFAXINE XR (EFFEXOR-XR) 75 MG 24 HR CAPSULE    Take 1 capsule (75 mg total) by mouth every morning.    Review of Systems  Constitutional: Positive for appetite change (Does not have much of an appetite) and fatigue. Negative for fever,  chills, diaphoresis, activity change and unexpected weight change.  Respiratory: Negative.   Cardiovascular: Negative.   Gastrointestinal: Positive for nausea (Chronic Issue) and diarrhea. Negative for vomiting, abdominal pain, constipation, blood in stool, abdominal distention, anal bleeding, rectal pain and bloating.  Neurological: Negative for dizziness, light-headedness and headaches.    Social History  Substance Use Topics  . Smoking status: Never Smoker   . Smokeless tobacco: Never Used     Comment: quit 1954  . Alcohol Use: No   Objective:   BP 116/60 mmHg  Pulse 80  Temp(Src) 97.7 F (36.5 C) (Oral)  Resp 16  Wt 128 lb (58.06 kg)  Physical Exam  Constitutional: She is oriented to person, place, and time. She appears well-developed and well-nourished.  Cardiovascular: Normal rate and regular rhythm.   Pulmonary/Chest: Effort normal and breath sounds normal.  Neurological: She is alert and oriented to person, place, and time.  Psychiatric: She has a normal mood and affect. Her behavior is normal. Judgment and thought content normal.      Assessment & Plan:     1. Diarrhea, unspecified type Intermittent. Referral back to GI and evaluate and treat.  - Ambulatory referral to Gastroenterology  Margarita Rana, MD          Margarita Rana, MD  [facility]

## 2015-01-16 ENCOUNTER — Ambulatory Visit: Payer: Medicare Other | Admitting: Psychiatry

## 2015-01-24 DIAGNOSIS — L538 Other specified erythematous conditions: Secondary | ICD-10-CM | POA: Diagnosis not present

## 2015-01-24 DIAGNOSIS — L820 Inflamed seborrheic keratosis: Secondary | ICD-10-CM | POA: Diagnosis not present

## 2015-01-24 DIAGNOSIS — L298 Other pruritus: Secondary | ICD-10-CM | POA: Diagnosis not present

## 2015-01-25 ENCOUNTER — Ambulatory Visit (INDEPENDENT_AMBULATORY_CARE_PROVIDER_SITE_OTHER): Payer: Medicare Other | Admitting: Psychiatry

## 2015-01-25 ENCOUNTER — Encounter: Payer: Self-pay | Admitting: Psychiatry

## 2015-01-25 VITALS — BP 98/58 | HR 108 | Temp 98.0°F | Ht <= 58 in | Wt 130.0 lb

## 2015-01-25 DIAGNOSIS — F331 Major depressive disorder, recurrent, moderate: Secondary | ICD-10-CM | POA: Diagnosis not present

## 2015-01-25 NOTE — Progress Notes (Signed)
[place] MD/PA/NP OP Progress Note  01/25/2015 2:39 PM AAMIA [facility]  MRN:  [id number]   Subjective:  Patient returns a follow-up of her major depressive disorder. She presents as in past appointments with her daughter. Patient states she is doing well. She states that she continue to have GI upset. We had associated this is worsening with some of the previous antidepressant medications that we tried. However it continued and patient states that someone gave her the tropical fruit Guava, and that has improved her GI issues significantly.  At the last visit we discontinued Lamictal to decrease polypharmacy and patient and daughter report there is been no changes or worsening of her mood. Daughter comments that her mother has been more like herself than she has been in a long time.  The tissue appears to be that the patient is having some sleep cycle issues. Patient is stating that she is going to bed late and sleeping late. She is denying that she is having any wakening when she does fall sleep and she does feel like she sleeps solidly. She describes a routine of staying up doing various activities, talking with staff in her assisted living and thus it does appear like it's more of a shift wake cycle issue as opposed to sleep onset or  Chief Complaint: I'm doing all right Chief Complaint    Follow-up; Medication Refill     Visit Diagnosis:     ICD-9-CM ICD-10-CM   1. Major depressive disorder, recurrent episode, moderate (HCC) 296.32 F33.1     Past Medical History:  Past Medical History  Diagnosis Date  . Hearing loss   . Hypertension   . Reflux   . Frequent headaches   . Difficulty swallowing   . Hypothyroidism   . Depression   . Cataract   . COPD (chronic obstructive pulmonary disease) [facility])     Past Surgical History  Procedure Laterality Date  . Total hip arthroplasty      x 4  . Lumbar laminectomy    . Parathyroidectomy    . Appendectomy    . Abdominal hysterectomy      Family History:  Family History  Problem Relation Age of Onset  . Stroke Mother   . Hypertension Mother   . Heart disease Father   . Hypertension Father    Social History:  Social History   Social History  . Marital Status: Widowed    Spouse Name: N/A  . Number of Children: N/A  . Years of Education: N/A   Social History Main Topics  . Smoking status: Never Smoker   . Smokeless tobacco: Never Used     Comment: quit 1954  . Alcohol Use: No  . Drug Use: No  . Sexual Activity: No   Other Topics Concern  . None   Social History Narrative   Additional History:   Assessment:   Musculoskeletal: Strength & Muscle Tone: She ambulates slowly but without any assistance Gait & Station: normal Patient leans: N/A  Psychiatric Specialty Exam: HPI  Review of Systems  Psychiatric/Behavioral: Negative for depression, suicidal ideas, hallucinations, memory loss and substance abuse. The patient is not nervous/anxious and does not have insomnia.   All other systems reviewed and are negative.   Blood pressure 98/58, pulse 108, temperature 98 F (36.7 C), temperature source Tympanic, height 4\' 9"  (1.448 m), weight 130 lb (58.968 kg), SpO2 95 %.Body mass index is 28.12 kg/(m^2).  General Appearance: Neat and Well Groomed  Eye Contact:  Good  Speech:  Normal Rate  Volume:  Normal  Mood:  better  Affect:  bright, joked that she would forgive this Probation officer for departing the clinic, smiling  Thought Process:  Linear and Logical  Orientation:  Full (Time, Place, and Person)  Thought Content:  Negative  Suicidal Thoughts:  No  Homicidal Thoughts:  No  Memory:  Immediate;   Good Recent;   Good Remote;   Good  Judgement:  Good  Insight:  Good  Psychomotor Activity:  Negative  Concentration:  Good  Recall:  Good  Fund of Knowledge: Good  Language: Good  Akathisia:  Negative  Handed:  Right unknown   AIMS (if indicated):  N/A  Assets:  Communication Skills Desire for  Improvement Social Support  ADL's:  Intact  Cognition: WNL  Sleep:  Fair. She goes to bed at 2AM but is able to sleep once asleep   Is the patient at risk to self?  No. Has the patient been a risk to self in the past 6 months?  No. Has the patient been a risk to self within the distant past?  No. Is the patient a risk to others?  No. Has the patient been a risk to others in the past 6 months?  No. Has the patient been a risk to others within the distant past?  No.  Current Medications: Current Outpatient Prescriptions  Medication Sig Dispense Refill  . acetaminophen (TYLENOL) 500 MG tablet Take 1 tablet (500 mg total) by mouth every 6 (six) hours as needed for moderate pain. Also give 500 mg three times a day for 7  Days. 60 tablet 0  . ALPRAZolam (XANAX) 0.25 MG tablet Take 1 tablet (0.25 mg total) by mouth daily as needed for anxiety. 30 tablet 3  . CALCIUM PO Take by mouth.    . celecoxib (CELEBREX) 200 MG capsule Take 1 capsule (200 mg total) by mouth daily. 30 capsule 0  . [facility] RANITIDINE 75 MG tablet   3  . dextromethorphan (DELSYM) 30 MG/5ML liquid Take by mouth 2 (two) times daily as needed for cough.    . esomeprazole (NEXIUM) 40 MG capsule Take 40 mg by mouth.    . FERROCITE 324 MG TABS TAKE 1 TABLET BY MOUTH EVERY DAY(NOT COVERED BY INS) 30 tablet 5  . Ferrous Fumarate 324 (106 FE) MG TABS TAKE 1 TABLET BY MOUTH EVERY DAY(NOT COVERED BY INS) 90 tablet 3  . fluticasone (FLONASE) 50 MCG/ACT nasal spray USE 2 SPRAYS NASALLY DAILY 48 g 2  . furosemide (LASIX) 40 MG tablet TAKE 1 TABLET IN THE MORNING 90 tablet 3  . guaiFENesin (MUCINEX) 600 MG 12 hr tablet Take 600 mg by mouth 2 (two) times daily.    Marland Kitchen levocetirizine (XYZAL) 5 MG tablet Take 5 mg by mouth as needed for allergies.    Marland Kitchen levothyroxine (SYNTHROID, LEVOTHROID) 50 MCG tablet TAKE 1 TABLET DAILY 90 tablet 2  . MELATONIN PO Take by mouth daily.    . montelukast (SINGULAIR) 10 MG tablet TAKE 1 TABLET DAILY 90 tablet 2  .  Multiple Vitamin (MULTIVITAMIN) tablet Take 1 tablet by mouth daily.    . OSCIMIN 0.125 MG tablet TAKE 1 TABLET TWICE A DAY 180 tablet 1  . potassium chloride (MICRO-K) 10 MEQ CR capsule TAKE 2 CAPSULES DAILY 180 capsule 2  . simvastatin (ZOCOR) 20 MG tablet TAKE 1 TABLET DAILY 90 tablet 3  . SPIRIVA HANDIHALER 18 MCG inhalation capsule INHALE THE CONTENTS OF 1 CAPSULE  DAILY 90 capsule 2  . spironolactone (ALDACTONE) 25 MG tablet TAKE 1 TABLET BY MOUTH EVERY DAY 30 tablet 5  . sucralfate (CARAFATE) 1 G tablet TAKE 1 TABLET BY MOUTH 4 TIMES A DAY FOR 7 DAYS THEN TWICE A DAY 60 tablet 5  . tamsulosin (FLOMAX) 0.4 MG CAPS capsule Take 1 capsule (0.4 mg total) by mouth daily. 90 capsule 1  . venlafaxine XR (EFFEXOR-XR) 75 MG 24 hr capsule Take 1 capsule (75 mg total) by mouth every morning. 90 capsule 1   No current facility-administered medications for this visit.    Medical Decision Making:  Established Problem, Stable/Improving (1) and Review of New Medication or Change in Dosage (2)  Treatment Plan Summary:Medication management and Plan   Major depressive disorder, recurrent, moderate. He is doing well on the Effexor and this will continue Effexor XR 75 mg daily. Continue  alprazolam .25 mg QD prn anxiety. He has used her last prescription or alprazolam 3 times in the past month.   Patient will follow up in 3 months. They're aware will be with a new provider. They've been encouraged to call the clinic with any questions or concerns.  Faith Rogue 01/25/2015, 2:39 PM

## 2015-02-15 ENCOUNTER — Other Ambulatory Visit: Payer: Self-pay | Admitting: Family Medicine

## 2015-02-15 DIAGNOSIS — K219 Gastro-esophageal reflux disease without esophagitis: Secondary | ICD-10-CM

## 2015-02-15 MED ORDER — SUCRALFATE 1 G PO TABS: ORAL_TABLET | ORAL | Status: DC

## 2015-02-15 NOTE — Telephone Encounter (Signed)
Last OV: 01/13/2015  Lat Refill: 07/14/2014 with 5 refills

## 2015-02-15 NOTE — Telephone Encounter (Signed)
Pt contacted office for refill request on the following medications:  sucralfate (CARAFATE) 1 G tablet. [facility].  [employer].  [phone]

## 2015-02-16 ENCOUNTER — Other Ambulatory Visit: Payer: Self-pay

## 2015-02-16 DIAGNOSIS — K219 Gastro-esophageal reflux disease without esophagitis: Secondary | ICD-10-CM

## 2015-02-16 MED ORDER — SUCRALFATE 1 G PO TABS: ORAL_TABLET | ORAL | Status: DC

## 2015-02-16 NOTE — Telephone Encounter (Signed)
Mandy from [facility] called requesting a prescription of Carafate be sent to local pharmacy to start while waiting for delivery from [facility]. Sent to [facility]. [place]. Renaldo Fiddler, CMA

## 2015-03-01 DIAGNOSIS — R197 Diarrhea, unspecified: Secondary | ICD-10-CM | POA: Diagnosis not present

## 2015-03-01 DIAGNOSIS — K219 Gastro-esophageal reflux disease without esophagitis: Secondary | ICD-10-CM | POA: Diagnosis not present

## 2015-03-03 ENCOUNTER — Encounter: Payer: Self-pay | Admitting: Internal Medicine

## 2015-03-03 ENCOUNTER — Ambulatory Visit (INDEPENDENT_AMBULATORY_CARE_PROVIDER_SITE_OTHER): Payer: Medicare Other | Admitting: Internal Medicine

## 2015-03-03 VITALS — BP 120/74 | HR 101 | Ht 60.0 in | Wt 126.8 lb

## 2015-03-03 DIAGNOSIS — J449 Chronic obstructive pulmonary disease, unspecified: Secondary | ICD-10-CM

## 2015-03-03 MED ORDER — AMBULATORY NON FORMULARY MEDICATION: Status: AC

## 2015-03-03 NOTE — Addendum Note (Signed)
Addended by: Maryanna Shape A on: 03/03/2015 12:56 PM   Modules accepted: Orders

## 2015-03-03 NOTE — Patient Instructions (Signed)
Follow up with Dr. Stevenson Clinch in: 6 months - cont with spiriva daily - gargle and rinse after each use - may use Nettipot sinus Rinse 1-2 times per week for chronic sinusitis - Mucinex as needed daily for cough, sinus congestion - incentive spirometry daily (10-15 times).

## 2015-03-03 NOTE — Assessment & Plan Note (Signed)
Known diagnosis COPD, most likely secondary to chronic secondhand smoke exposure. Her kyphosis does add to discomfort and impaired some chest wall mechanics of breathing which can exacerbate or amplify shortness of breath. Her CT scan does show centrilobular emphysema, and some mild pulmonary fibrosis at the bases. Today she is doing well, no worsening symptoms. We agreed to start incentive spirometry daily and to continue with Spiriva Plan: - Continue with Spiriva - Avoid any allergens or triggers - For her chronic sinusitis - recommend nettipot sinus rinse 1-2 times per week - incentive spirometry 10-15 times per day.

## 2015-03-03 NOTE — Progress Notes (Signed)
MRN# [id number] Sandra Brown Aug 30, 1926   CC: Chief Complaint  Patient presents with  . Follow-up    pt. states breathing is baseline. occ. SOB. dry cough sometimes prod. dark yellow in color mostly in am. occ. wheezing. denies chest pain/tightness.        Brief History: 07/06/14 HPI:  Patient is a pleasant 82 year old female accompanied by her granddaughter today for further evaluation of shortness of breath with desaturation during physical therapy. Further history in reviewing of records showed that the patient has had recurrent upper respiratory tract infections since the beginning of the year, about 5-6, requiring anti-biotics and steroids. Patient states she has a history of chronic sinusitis, most recent upper respiratory tract infection was 2 weeks ago that required antibiotic only. Given the recurrence of upper respiratory tract infections have physical therapy has been more troublesome in terms of her desaturating to the mid to low 80s. Patient stated back in December 2015 she had a episode of lower extremity bilateral cellulitis, but that had no relation to her breathing issues that she could link. She does endorse a cough that's mild with thick white sputum, chronic shortness of breath, worsening dyspnea on exertion. Patient experimented with tobacco during her teenage years for about 2 years. 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 She had chronic secondhand smoke exposure from her husband for about 30 years She is currently on Spiriva Plan: CT chest, PFTs, 6 minute walk test   Events since last clinic visit: Patient presents today for follow-up visit of COPD. She is accompanied by her daughter. She endorses mild white/green sputum production, which she states is normal for her, there is no change in color of her sputum or amount. Overall patient states that she is in stable respiratory status. Currently not participating in physical therapy anymore, and  not requiring supplemental O2. States that she has been using mucinex daily for allergies and sinus congestion. Also, has a netipot for sinus rinse, but has not used it in months.  Medication:   Current Outpatient Rx  Name  Route  Sig  Dispense  Refill  . acetaminophen (TYLENOL) 500 MG tablet   Oral   Take 1 tablet (500 mg total) by mouth every 6 (six) hours as needed for moderate pain. Also give 500 mg three times a day for 7  Days.   60 tablet   0   . ALPRAZolam (XANAX) 0.25 MG tablet   Oral   Take 1 tablet (0.25 mg total) by mouth daily as needed for anxiety.   30 tablet   3   . CALCIUM PO   Oral   Take by mouth.         . celecoxib (CELEBREX) 200 MG capsule   Oral   Take 1 capsule (200 mg total) by mouth daily.   30 capsule   0   . [facility] RANITIDINE 75 MG tablet            3     Dispense as written.   Marland Kitchen dextromethorphan (DELSYM) 30 MG/5ML liquid   Oral   Take by mouth 2 (two) times daily as needed for cough.         . esomeprazole (NEXIUM) 40 MG capsule   Oral   Take 40 mg by mouth.         . FERROCITE 324 MG TABS      TAKE 1 TABLET BY MOUTH EVERY DAY(NOT COVERED BY INS)   30 tablet  5   . Ferrous Fumarate 324 (106 FE) MG TABS      TAKE 1 TABLET BY MOUTH EVERY DAY(NOT COVERED BY INS)   90 tablet   3   . fluticasone (FLONASE) 50 MCG/ACT nasal spray      USE 2 SPRAYS NASALLY DAILY   48 g   2   . furosemide (LASIX) 40 MG tablet      TAKE 1 TABLET IN THE MORNING   90 tablet   3   . guaiFENesin (MUCINEX) 600 MG 12 hr tablet   Oral   Take 600 mg by mouth 2 (two) times daily.         Marland Kitchen levocetirizine (XYZAL) 5 MG tablet   Oral   Take 5 mg by mouth as needed for allergies.         Marland Kitchen levothyroxine (SYNTHROID, LEVOTHROID) 50 MCG tablet      TAKE 1 TABLET DAILY   90 tablet   2   . MELATONIN PO   Oral   Take by mouth daily.         . montelukast (SINGULAIR) 10 MG tablet      TAKE 1 TABLET DAILY   90 tablet   2   . Multiple  Vitamin (MULTIVITAMIN) tablet   Oral   Take 1 tablet by mouth daily.         . OSCIMIN 0.125 MG tablet      TAKE 1 TABLET TWICE A DAY   180 tablet   1   . potassium chloride (MICRO-K) 10 MEQ CR capsule      TAKE 2 CAPSULES DAILY   180 capsule   2   . simvastatin (ZOCOR) 20 MG tablet      TAKE 1 TABLET DAILY   90 tablet   3   . SPIRIVA HANDIHALER 18 MCG inhalation capsule      INHALE THE CONTENTS OF 1 CAPSULE DAILY   90 capsule   2   . spironolactone (ALDACTONE) 25 MG tablet      TAKE 1 TABLET BY MOUTH EVERY DAY   30 tablet   5   . sucralfate (CARAFATE) 1 g tablet      TAKE 1 TABLET BY MOUTH 4 TIMES A DAY FOR 7 DAYS THEN TWICE A DAY   60 tablet   0   . tamsulosin (FLOMAX) 0.4 MG CAPS capsule   Oral   Take 1 capsule (0.4 mg total) by mouth daily.   90 capsule   1   . venlafaxine XR (EFFEXOR-XR) 75 MG 24 hr capsule   Oral   Take 1 capsule (75 mg total) by mouth every morning.   90 capsule   1   . lamoTRIgine (LAMICTAL) 100 MG tablet                  Review of Systems: Gen:  Denies  fever, sweats, chills HEENT: Denies blurred vision, double vision, ear pain, eye pain, hearing loss, nose bleeds, sore throat Cvc:  No dizziness, chest pain or heaviness Resp:   Admits [id number] sputum production and shortness of breath Gi: Denies swallowing difficulty, stomach pain, nausea or vomiting, diarrhea, constipation, bowel incontinence Gu:  Denies bladder incontinence, burning urine Ext:   No Joint pain, stiffness or swelling Skin: No skin rash, easy bruising or bleeding or hives Endoc:  No polyuria, polydipsia , polyphagia or weight change Other:  All other systems negative  Allergies:  Sulfa antibiotics; Erythromycin; Aspirin; Contrast media;  Latex; Penicillin g; Penicillins; and Tape  Physical Examination:  VS: BP 120/74 mmHg  Pulse 101  Ht 5' (1.524 m)  Wt 126 lb 12.8 oz (57.516 kg)  BMI 24.76 kg/m2  SpO2 92%  General Appearance: No distress   HEENT: PERRLA, no ptosis, mild Thrush noted in the posterior right oropharynx Pulmonary: Mild decreased breath sounds at the bilateral bases, some fine rales at the left lower base, otherwise no significant changes from last exam Cardiovascular:  Normal S1,S2.  No m/r/g.     Abdomen:Exam: Benign, Soft, non-tender, No masses  Skin:   warm, no rashes, no ecchymosis  Extremities: normal, no cyanosis, clubbing, warm with normal capillary refill.      Rad results: (The following images and results were reviewed by Dr. Stevenson Clinch). CT Chest 07/2014 CT CHEST WITHOUT CONTRAST  TECHNIQUE: Multidetector CT imaging of the chest was performed following the standard protocol without IV contrast.  COMPARISON: Chest radiograph August 19, 2012  FINDINGS: There is a degree of underlying centrilobular emphysematous change. There is scarring in each lung base. There is no edema or consolidation. There is mild fibrotic change in the anterior left lung base.  Thyroid appears unremarkable. There is no appreciable thoracic adenopathy.  There is extensive atherosclerotic calcification in the aorta. There is no thoracic aortic aneurysm. There is extensive coronary artery calcification. The pericardium is not thickened.  There is a sizable hiatal type hernia.  In the visualized upper abdomen, gallbladder appears mildly distended. Gallbladder is incompletely visualized. There is extensive atherosclerotic change in the aorta and visualized major mesenteric vessels.  There is degenerative change throughout the thoracic spine. There is anterior wedging of the T5 vertebral body. There is antral listhesis of C7 on T1. There are no blastic or lytic bone lesions. There is extensive osteoarthritic change throughout the cervical and thoracic spine. There is advanced arthropathy in the right shoulder.  IMPRESSION: Areas of lung scarring in the bases with mild interstitial fibrosis in the anterior  left base. Underlying centrilobular emphysematous type change. No edema or consolidation.  Extensive atherosclerotic change. Widespread coronary artery calcification is noted.  No appreciable adenopathy.  Sizable hiatal type hernia.  Gallbladder incompletely visualized but does appear somewhat distended. Visualized gallbladder wall is not thickened.  Extensive bony changes. Note grade II/IV anterolisthesis of C7 on T1.    Assessment and Plan: 81 year old female with history of recurrent pulmonary infections and COPD seen for follow-up visit COPD (chronic obstructive pulmonary disease) Known diagnosis COPD, most likely secondary to chronic secondhand smoke exposure. Her kyphosis does add to discomfort and impaired some chest wall mechanics of breathing which can exacerbate or amplify shortness of breath. Her CT scan does show centrilobular emphysema, and some mild pulmonary fibrosis at the bases. Today she is doing well, no worsening symptoms. We agreed to start incentive spirometry daily and to continue with Spiriva Plan: - Continue with Spiriva - Avoid any allergens or triggers - For her chronic sinusitis - recommend nettipot sinus rinse 1-2 times per week - incentive spirometry 10-15 times per day.         Updated Medication List Outpatient Encounter Prescriptions as of 03/03/2015  Medication Sig  . acetaminophen (TYLENOL) 500 MG tablet Take 1 tablet (500 mg total) by mouth every 6 (six) hours as needed for moderate pain. Also give 500 mg three times a day for 7  Days.  . ALPRAZolam (XANAX) 0.25 MG tablet Take 1 tablet (0.25 mg total) by mouth daily as needed for anxiety.  Marland Kitchen  CALCIUM PO Take by mouth.  . celecoxib (CELEBREX) 200 MG capsule Take 1 capsule (200 mg total) by mouth daily.  . [facility] RANITIDINE 75 MG tablet   . dextromethorphan (DELSYM) 30 MG/5ML liquid Take by mouth 2 (two) times daily as needed for cough.  . esomeprazole (NEXIUM) 40 MG capsule Take 40 mg by  mouth.  . FERROCITE 324 MG TABS TAKE 1 TABLET BY MOUTH EVERY DAY(NOT COVERED BY INS)  . Ferrous Fumarate 324 (106 FE) MG TABS TAKE 1 TABLET BY MOUTH EVERY DAY(NOT COVERED BY INS)  . fluticasone (FLONASE) 50 MCG/ACT nasal spray USE 2 SPRAYS NASALLY DAILY  . furosemide (LASIX) 40 MG tablet TAKE 1 TABLET IN THE MORNING  . guaiFENesin (MUCINEX) 600 MG 12 hr tablet Take 600 mg by mouth 2 (two) times daily.  Marland Kitchen levocetirizine (XYZAL) 5 MG tablet Take 5 mg by mouth as needed for allergies.  Marland Kitchen levothyroxine (SYNTHROID, LEVOTHROID) 50 MCG tablet TAKE 1 TABLET DAILY  . MELATONIN PO Take by mouth daily.  . montelukast (SINGULAIR) 10 MG tablet TAKE 1 TABLET DAILY  . Multiple Vitamin (MULTIVITAMIN) tablet Take 1 tablet by mouth daily.  . OSCIMIN 0.125 MG tablet TAKE 1 TABLET TWICE A DAY  . potassium chloride (MICRO-K) 10 MEQ CR capsule TAKE 2 CAPSULES DAILY  . simvastatin (ZOCOR) 20 MG tablet TAKE 1 TABLET DAILY  . SPIRIVA HANDIHALER 18 MCG inhalation capsule INHALE THE CONTENTS OF 1 CAPSULE DAILY  . spironolactone (ALDACTONE) 25 MG tablet TAKE 1 TABLET BY MOUTH EVERY DAY  . sucralfate (CARAFATE) 1 g tablet TAKE 1 TABLET BY MOUTH 4 TIMES A DAY FOR 7 DAYS THEN TWICE A DAY  . tamsulosin (FLOMAX) 0.4 MG CAPS capsule Take 1 capsule (0.4 mg total) by mouth daily.  Marland Kitchen venlafaxine XR (EFFEXOR-XR) 75 MG 24 hr capsule Take 1 capsule (75 mg total) by mouth every morning.  . [DISCONTINUED] DULoxetine (CYMBALTA) 30 MG capsule   . lamoTRIgine (LAMICTAL) 100 MG tablet   . [DISCONTINUED] dextromethorphan (DELSYM) 30 MG/5ML liquid Take by mouth. Reported on 03/03/2015   No facility-administered encounter medications on file as of 03/03/2015.    Orders for this visit: No orders of the defined types were placed in this encounter.    Thank  you for the visitation and for allowing  [facility] to assist in the care of your patient. Our recommendations are noted above.  Please contact us if we can be  of further service.  Vilinda Boehringer, MD [facility] Office Number: [phone]

## 2015-03-10 ENCOUNTER — Encounter: Payer: Self-pay | Admitting: Family Medicine

## 2015-03-12 ENCOUNTER — Other Ambulatory Visit: Payer: Self-pay | Admitting: Family Medicine

## 2015-03-12 DIAGNOSIS — E876 Hypokalemia: Secondary | ICD-10-CM

## 2015-03-13 ENCOUNTER — Encounter: Payer: Self-pay | Admitting: Podiatry

## 2015-03-13 ENCOUNTER — Ambulatory Visit (INDEPENDENT_AMBULATORY_CARE_PROVIDER_SITE_OTHER): Payer: Medicare Other | Admitting: Podiatry

## 2015-03-13 DIAGNOSIS — M79676 Pain in unspecified toe(s): Secondary | ICD-10-CM | POA: Diagnosis not present

## 2015-03-13 DIAGNOSIS — B351 Tinea unguium: Secondary | ICD-10-CM | POA: Diagnosis not present

## 2015-03-13 NOTE — Progress Notes (Signed)
She presents today with a chief complaint of painful elongated toenails.  Objective: Vital signs are stable she is alert and oriented 3. Pulses are strongly palpable. Her toenails are thick yellow dystrophic with mycotic and painful on palpation.  Assessment: Pain in limb secondary to onychomycosis 1 through 5 bilaterally.  Plan: Discussed etiology pathology conservative versus surgical therapies.

## 2015-03-24 ENCOUNTER — Other Ambulatory Visit: Payer: Self-pay | Admitting: Family Medicine

## 2015-03-24 DIAGNOSIS — D509 Iron deficiency anemia, unspecified: Secondary | ICD-10-CM

## 2015-03-26 ENCOUNTER — Other Ambulatory Visit: Payer: Self-pay | Admitting: Family Medicine

## 2015-03-26 DIAGNOSIS — J449 Chronic obstructive pulmonary disease, unspecified: Secondary | ICD-10-CM

## 2015-04-01 ENCOUNTER — Other Ambulatory Visit: Payer: Self-pay | Admitting: Family Medicine

## 2015-04-01 DIAGNOSIS — J309 Allergic rhinitis, unspecified: Secondary | ICD-10-CM

## 2015-04-19 ENCOUNTER — Encounter: Payer: Self-pay | Admitting: Psychiatry

## 2015-04-19 ENCOUNTER — Ambulatory Visit: Payer: Medicare Other | Admitting: Psychiatry

## 2015-04-19 VITALS — BP 140/78 | HR 92 | Temp 97.1°F | Ht 60.0 in | Wt 126.4 lb

## 2015-04-19 DIAGNOSIS — F331 Major depressive disorder, recurrent, moderate: Secondary | ICD-10-CM

## 2015-04-19 MED ORDER — VENLAFAXINE HCL ER 75 MG PO CP24: 75.0000 mg | ORAL_CAPSULE | ORAL | Status: DC

## 2015-04-19 NOTE — Progress Notes (Signed)
Patient ID: Sandra Brown, female   DOB: 09-16-1926, 82 y.o.   MRN: [id number] [facility] MD/PA/NP OP Progress Note  04/19/2015 1:47 PM CHARRELLE PRECHTEL  MRN:  [id number]   Subjective:  Patient returns a follow-up of her major depressive disorder. She was previously seen by Dr. Jimmye Norman and is the first visit for this patient with this clinician. She reports doing quite well. She lives in an assisted living facility and states that the take good care of her. States that she has some trouble sleeping and it's about 20 a.m. before she sleeps which is not interested in taking any medication. She takes the alprazolam about 2-3 times a month when she gets anxious. States that one of her daughters and grandchildren live in this town and she gets to see them once in a while. Recently her children from [place] visiting her and spent about a week with her. Denies any suicidal thoughts. States doing well overall.  Chief Complaint: doing ok Chief Complaint    Follow-up; Medication Refill     Visit Diagnosis:     ICD-9-CM ICD-10-CM   1. Major depressive disorder, recurrent episode, moderate (HCC) 296.32 F33.1     Past Medical History:  Past Medical History  Diagnosis Date  . Hearing loss   . Hypertension   . Reflux   . Frequent headaches   . Difficulty swallowing   . Hypothyroidism   . Depression   . Cataract   . COPD (chronic obstructive pulmonary disease) [facility])     Past Surgical History  Procedure Laterality Date  . Total hip arthroplasty      x 4  . Lumbar laminectomy    . Parathyroidectomy    . Appendectomy    . Abdominal hysterectomy     Family History:  Family History  Problem Relation Age of Onset  . Stroke Mother   . Hypertension Mother   . Heart disease Father   . Hypertension Father    Social History:  Social History   Social History  . Marital Status: Widowed    Spouse Name: N/A  . Number of Children: N/A  . Years of Education: N/A   Social History Main Topics  .  Smoking status: Never Smoker   . Smokeless tobacco: Never Used     Comment: quit 1954  . Alcohol Use: No  . Drug Use: No  . Sexual Activity: No   Other Topics Concern  . Not on file   Social History Narrative   Additional History:   Assessment:   Musculoskeletal: Strength & Muscle Tone: She ambulates slowly but without any assistance Gait & Station: normal Patient leans: N/A  Psychiatric Specialty Exam: HPI  Review of Systems  Psychiatric/Behavioral: Negative for depression, suicidal ideas, hallucinations, memory loss and substance abuse. The patient is not nervous/anxious and does not have insomnia.   All other systems reviewed and are negative.   There were no vitals taken for this visit.There is no weight on file to calculate BMI.  General Appearance: Neat and Well Groomed  Eye Contact:  Good  Speech:  Normal Rate  Volume:  Normal  Mood:  better  Affect:  smiling  Thought Process:  Linear and Logical  Orientation:  Full (Time, Place, and Person)  Thought Content:  Negative  Suicidal Thoughts:  No  Homicidal Thoughts:  No  Memory:  Immediate;   Good Recent;   Good Remote;   Good  Judgement:  Good  Insight:  Good  Psychomotor Activity:  Negative  Concentration:  Good  Recall:  Good  Fund of Knowledge: Good  Language: Good  Akathisia:  Negative  Handed:  Right unknown   AIMS (if indicated):  N/A  Assets:  Communication Skills Desire for Improvement Social Support  ADL's:  Intact  Cognition: WNL  Sleep:  Fair. She goes to bed at 2AM but is able to sleep once asleep   Is the patient at risk to self?  No. Has the patient been a risk to self in the past 6 months?  No. Has the patient been a risk to self within the distant past?  No. Is the patient a risk to others?  No. Has the patient been a risk to others in the past 6 months?  No. Has the patient been a risk to others within the distant past?  No.  Current Medications: Current Outpatient Prescriptions   Medication Sig Dispense Refill  . acetaminophen (TYLENOL) 500 MG tablet Take 1 tablet (500 mg total) by mouth every 6 (six) hours as needed for moderate pain. Also give 500 mg three times a day for 7  Days. 60 tablet 0  . ALPRAZolam (XANAX) 0.25 MG tablet Take 1 tablet (0.25 mg total) by mouth daily as needed for anxiety. 30 tablet 3  . AMBULATORY NON FORMULARY MEDICATION Medication Name: incentive spirometry Use as directed 1 each 0  . CALCIUM PO Take by mouth.    . celecoxib (CELEBREX) 200 MG capsule Take 1 capsule (200 mg total) by mouth daily. 30 capsule 0  . [facility] RANITIDINE 75 MG tablet   3  . dextromethorphan (DELSYM) 30 MG/5ML liquid Take by mouth 2 (two) times daily as needed for cough.    . esomeprazole (NEXIUM) 40 MG capsule Take 40 mg by mouth.    . FERROCITE 324 MG TABS tablet TAKE 1 TABLET BY MOUTH EVERY DAY 90 tablet 1  . Ferrous Fumarate 324 (106 FE) MG TABS TAKE 1 TABLET BY MOUTH EVERY DAY(NOT COVERED BY INS) 90 tablet 3  . fluticasone (FLONASE) 50 MCG/ACT nasal spray USE 2 SPRAYS NASALLY DAILY 48 g 2  . furosemide (LASIX) 40 MG tablet TAKE 1 TABLET IN THE MORNING 90 tablet 3  . guaiFENesin (MUCINEX) 600 MG 12 hr tablet Take 600 mg by mouth 2 (two) times daily.    Marland Kitchen lamoTRIgine (LAMICTAL) 100 MG tablet     . levocetirizine (XYZAL) 5 MG tablet TAKE 1 TABLET AT BEDTIME EVERY NIGHT 90 tablet 1  . levothyroxine (SYNTHROID, LEVOTHROID) 50 MCG tablet TAKE 1 TABLET DAILY 90 tablet 2  . MELATONIN PO Take by mouth daily.    . montelukast (SINGULAIR) 10 MG tablet TAKE 1 TABLET DAILY 90 tablet 2  . Multiple Vitamin (MULTIVITAMIN) tablet Take 1 tablet by mouth daily.    . OSCIMIN 0.125 MG tablet TAKE 1 TABLET TWICE A DAY 180 tablet 1  . potassium chloride (MICRO-K) 10 MEQ CR capsule TAKE 2 CAPSULES DAILY 180 capsule 1  . simvastatin (ZOCOR) 20 MG tablet TAKE 1 TABLET DAILY 90 tablet 3  . SPIRIVA HANDIHALER 18 MCG inhalation capsule INHALE THE CONTENTS OF 1 CAPSULE DAILY 90 capsule 1  .  spironolactone (ALDACTONE) 25 MG tablet TAKE 1 TABLET BY MOUTH EVERY DAY 30 tablet 5  . sucralfate (CARAFATE) 1 g tablet TAKE 1 TABLET BY MOUTH 4 TIMES A DAY FOR 7 DAYS THEN TWICE A DAY 60 tablet 0  . tamsulosin (FLOMAX) 0.4 MG CAPS capsule Take 1 capsule (0.4 mg total) by  mouth daily. 90 capsule 1  . venlafaxine XR (EFFEXOR-XR) 75 MG 24 hr capsule Take 1 capsule (75 mg total) by mouth every morning. 90 capsule 1   No current facility-administered medications for this visit.    Medical Decision Making:  Established Problem, Stable/Improving (1) and Review of New Medication or Change in Dosage (2)  Treatment Plan Summary:Medication management and Plan   Major depressive disorder, recurrent, moderate. She continues to do  well on the Effexor and will continue Effexor XR 75 mg daily.  Continue  alprazolam .25 mg QD prn anxiety. She takes it about 2-3 times a month.  Patient will follow up in 3 months.  She has been encouraged to call the clinic with any questions or concerns.  Voncille Simm 04/19/2015, 1:47 PM

## 2015-04-21 ENCOUNTER — Ambulatory Visit: Payer: Medicare Other | Admitting: Psychiatry

## 2015-04-21 ENCOUNTER — Other Ambulatory Visit: Payer: Self-pay | Admitting: Family Medicine

## 2015-04-21 DIAGNOSIS — R600 Localized edema: Secondary | ICD-10-CM

## 2015-04-25 DIAGNOSIS — I8312 Varicose veins of left lower extremity with inflammation: Secondary | ICD-10-CM | POA: Diagnosis not present

## 2015-04-25 DIAGNOSIS — I8311 Varicose veins of right lower extremity with inflammation: Secondary | ICD-10-CM | POA: Diagnosis not present

## 2015-04-28 DIAGNOSIS — H9313 Tinnitus, bilateral: Secondary | ICD-10-CM | POA: Diagnosis not present

## 2015-04-28 DIAGNOSIS — H8109 Meniere's disease, unspecified ear: Secondary | ICD-10-CM | POA: Diagnosis not present

## 2015-05-27 ENCOUNTER — Other Ambulatory Visit: Payer: Self-pay | Admitting: Family Medicine

## 2015-06-04 ENCOUNTER — Other Ambulatory Visit: Payer: Self-pay | Admitting: Family Medicine

## 2015-06-11 ENCOUNTER — Other Ambulatory Visit: Payer: Self-pay | Admitting: Family Medicine

## 2015-06-12 NOTE — Telephone Encounter (Signed)
01/13/2015 last ov

## 2015-06-13 ENCOUNTER — Encounter: Payer: Self-pay | Admitting: Family Medicine

## 2015-06-13 ENCOUNTER — Ambulatory Visit (INDEPENDENT_AMBULATORY_CARE_PROVIDER_SITE_OTHER): Payer: Medicare Other | Admitting: Family Medicine

## 2015-06-13 VITALS — BP 104/52 | HR 88 | Temp 97.7°F | Resp 20 | Wt 124.0 lb

## 2015-06-13 DIAGNOSIS — K589 Irritable bowel syndrome without diarrhea: Secondary | ICD-10-CM | POA: Diagnosis not present

## 2015-06-13 DIAGNOSIS — S46811A Strain of other muscles, fascia and tendons at shoulder and upper arm level, right arm, initial encounter: Secondary | ICD-10-CM | POA: Diagnosis not present

## 2015-06-13 NOTE — Progress Notes (Signed)
Subjective:    Patient ID: Sandra Brown, female    DOB: 14-Oct-1926, 82 y.o.   MRN: [id number]  Shoulder Pain  The pain is present in the right shoulder. The current episode started more than 1 month ago ("couple of months"). There has been no history of extremity trauma. The problem occurs intermittently. The problem has been gradually worsening. The quality of the pain is described as aching. The pain is at a severity of 7/10. Associated symptoms include a limited range of motion. Pertinent negatives include no fever, joint swelling, numbness, stiffness or tingling. Exacerbated by: some activity. She has tried acetaminophen for the symptoms. The treatment provided moderate relief.  Abdominal Pain This is a recurrent problem. Associated symptoms include flatus. Pertinent negatives include no fever. Associated symptoms comments: Pt reports she does not have diarrhea, but does have frequent bowel movements, up to 7 times a day.. The pain is aggravated by bowel movement (and passing flatus). Treatments tried: carafate, hyoscamine, kaopectate. Improvement on treatment: pt reports she has been on this treatment "for a long time", and is unsure if the medication is still effective.      Review of Systems  Constitutional: Negative for fever.  Gastrointestinal: Positive for abdominal pain and flatus.  Musculoskeletal: Negative for stiffness.  Neurological: Negative for tingling and numbness.   BP 104/52 mmHg  Pulse 88  Temp(Src) 97.7 F (36.5 C) (Oral)  Resp 20  Wt 124 lb (56.246 kg)  SpO2 96%   Patient Active Problem List   Diagnosis Date Noted  . IBS (irritable bowel syndrome) 12/15/2014  . Allergic rhinitis 11/14/2014  . DDD (degenerative disc disease), lumbar 09/21/2014  . Facet syndrome, lumbar 09/21/2014  . Sacroiliac joint dysfunction 09/21/2014  . Greater trochanteric bursitis 09/21/2014  . Hyperlipemia 09/01/2014  . Depression 08/29/2014  . Leg edema 08/29/2014  . Iron  deficiency anemia 07/14/2014  . GERD (gastroesophageal reflux disease) 07/14/2014  . Tinea corporis 07/12/2014  . Thrush 07/12/2014  . Dyspnea 07/06/2014  . Recurrent respiratory infection 07/06/2014  . COPD (chronic obstructive pulmonary disease) ([place]) 06/24/2014  . Hypokalemia 06/17/2014  . Incomplete bladder emptying 12/09/2012  . Urge incontinence 12/09/2012  . FOM (frequency of micturition) 12/09/2012  . Obstruction of urinary tract 12/09/2012  . Basal cell carcinoma of face 08/20/2010   Past Medical History  Diagnosis Date  . Hearing loss   . Hypertension   . Reflux   . Frequent headaches   . Difficulty swallowing   . Hypothyroidism   . Depression   . Cataract   . COPD (chronic obstructive pulmonary disease) ([place])    Current Outpatient Prescriptions on File Prior to Visit  Medication Sig  . acetaminophen (TYLENOL) 500 MG tablet Take 1 tablet (500 mg total) by mouth every 6 (six) hours as needed for moderate pain. Also give 500 mg three times a day for 7  Days.  . ALPRAZolam (XANAX) 0.25 MG tablet Take 1 tablet (0.25 mg total) by mouth daily as needed for anxiety.  . AMBULATORY NON FORMULARY MEDICATION Medication Name: incentive spirometry Use as directed  . CALCIUM PO Take by mouth.  . celecoxib (CELEBREX) 200 MG capsule Take 1 capsule (200 mg total) by mouth daily.  . CVS RANITIDINE 75 MG tablet   . dextromethorphan (DELSYM) 30 MG/5ML liquid Take by mouth 2 (two) times daily as needed for cough.  . esomeprazole (NEXIUM) 40 MG capsule Take 40 mg by mouth.  . Ferrous Fumarate 324 (106 FE) MG TABS TAKE  1 TABLET BY MOUTH EVERY DAY(NOT COVERED BY INS)  . fluticasone (FLONASE) 50 MCG/ACT nasal spray USE 2 SPRAYS NASALLY DAILY  . furosemide (LASIX) 40 MG tablet TAKE 1 TABLET IN THE MORNING  . guaiFENesin (MUCINEX) 600 MG 12 hr tablet Take 600 mg by mouth 2 (two) times daily.  Marland Kitchen levocetirizine (XYZAL) 5 MG tablet TAKE 1 TABLET AT BEDTIME EVERY NIGHT  . levothyroxine  (SYNTHROID, LEVOTHROID) 50 MCG tablet TAKE 1 TABLET DAILY  . MELATONIN PO Take by mouth daily.  . montelukast (SINGULAIR) 10 MG tablet TAKE 1 TABLET DAILY  . Multiple Vitamin (MULTIVITAMIN) tablet Take 1 tablet by mouth daily.  . OSCIMIN 0.125 MG tablet TAKE 1 TABLET TWICE A DAY  . potassium chloride (MICRO-K) 10 MEQ CR capsule TAKE 2 CAPSULES DAILY  . simvastatin (ZOCOR) 20 MG tablet TAKE 1 TABLET DAILY  . SPIRIVA HANDIHALER 18 MCG inhalation capsule INHALE THE CONTENTS OF 1 CAPSULE DAILY  . spironolactone (ALDACTONE) 25 MG tablet TAKE 1 TABLET BY MOUTH EVERY DAY  . sucralfate (CARAFATE) 1 g tablet TAKE 1 TABLET BY MOUTH 4 TIMES A DAY FOR 7 DAYS THEN TWICE A DAY  . tamsulosin (FLOMAX) 0.4 MG CAPS capsule TAKE 1 CAPSULE DAILY  . venlafaxine XR (EFFEXOR-XR) 75 MG 24 hr capsule Take 1 capsule (75 mg total) by mouth every morning.   No current facility-administered medications on file prior to visit.   Allergies  Allergen Reactions  . Sulfa Antibiotics Rash and Itching    Other reaction(s): Diarrhea and vomiting (finding)  . Erythromycin Nausea And Vomiting    Other reaction(s): Diarrhea and vomiting (finding)  . Aspirin Other (See Comments), Tinitus and Nausea And Vomiting    Ringing of the ears, caused hearing loss both ears Ringing in ears  . Contrast Media [Iodinated Diagnostic Agents] Rash and Hives  . Latex Rash and Itching  . Penicillin G Rash  . Penicillins Rash    Other reaction(s): UNKNOWN  . Tape Rash    Other reaction(s): UNKNOWN Adhesive Other reaction(s): UNKNOWN Adhesive   Past Surgical History  Procedure Laterality Date  . Total hip arthroplasty      x 4  . Lumbar laminectomy    . Parathyroidectomy    . Appendectomy    . Abdominal hysterectomy     Social History   Social History  . Marital Status: Widowed    Spouse Name: N/A  . Number of Children: N/A  . Years of Education: N/A   Occupational History  . Not on file.   Social History Main Topics  .  Smoking status: Never Smoker   . Smokeless tobacco: Never Used     Comment: quit 1954  . Alcohol Use: No  . Drug Use: No  . Sexual Activity: No   Other Topics Concern  . Not on file   Social History Narrative   Family History  Problem Relation Age of Onset  . Stroke Mother   . Hypertension Mother   . Heart disease Father   . Hypertension Father       Objective:   Physical Exam  Constitutional: She is oriented to person, place, and time. She appears well-developed and well-nourished.  Cardiovascular: Normal rate and regular rhythm.   Pulmonary/Chest: Effort normal and breath sounds normal.  Abdominal: Soft. Bowel sounds are normal. There is no tenderness.  Musculoskeletal:  Tender over right trapezius.  Neurological: She is alert and oriented to person, place, and time.  Psychiatric: She has a normal mood  and affect. Her behavior is normal.  BP 104/52 mmHg  Pulse 88  Temp(Src) 97.7 F (36.5 C) (Oral)  Resp 20  Wt 124 lb (56.246 kg)  SpO2 96%     Assessment & Plan:  1. IBS (irritable bowel syndrome) Worsening. Will refer to GI to treat. Has not responded to current treatment  - Ambulatory referral to Gastroenterology  2. Trapezius muscle strain, right, initial encounter Worsening. Refer to PT as below. - Ambulatory referral to Physical Therapy    Patient seen and examined by Jerrell Belfast, MD, and note scribed by Renaldo Fiddler, CMA.  I have reviewed the document for accuracy and completeness and I agree with above. Jerrell Belfast, MD   Margarita Rana, MD

## 2015-06-14 ENCOUNTER — Ambulatory Visit (INDEPENDENT_AMBULATORY_CARE_PROVIDER_SITE_OTHER): Payer: Medicare Other | Admitting: Podiatry

## 2015-06-14 ENCOUNTER — Telehealth: Payer: Self-pay | Admitting: Family Medicine

## 2015-06-14 ENCOUNTER — Encounter: Payer: Self-pay | Admitting: Podiatry

## 2015-06-14 DIAGNOSIS — M79676 Pain in unspecified toe(s): Secondary | ICD-10-CM | POA: Diagnosis not present

## 2015-06-14 DIAGNOSIS — B351 Tinea unguium: Secondary | ICD-10-CM | POA: Diagnosis not present

## 2015-06-14 NOTE — Progress Notes (Signed)
She presents today with chief complaint of painful elongated toenails 1 through 5 bilateral.  Objective: Vital signs are stable alert and oriented 3. Pulses are strongly palpable. Neurologic sensorium is intact. Deep tendon reflexes are intact. Toenails are thick yellow dystrophic Lynco mycotic and painful on palpation.  Assessment: Pain in limb secondary to onychomycosis 1 through 5 bilateral.  Plan: Debridement of toenails 1 through 5 bilateral covered service secondary to pain. Follow-up with me on an as-needed basis.

## 2015-06-19 DIAGNOSIS — K219 Gastro-esophageal reflux disease without esophagitis: Secondary | ICD-10-CM | POA: Diagnosis not present

## 2015-06-19 DIAGNOSIS — R197 Diarrhea, unspecified: Secondary | ICD-10-CM | POA: Diagnosis not present

## 2015-06-20 DIAGNOSIS — S46811A Strain of other muscles, fascia and tendons at shoulder and upper arm level, right arm, initial encounter: Secondary | ICD-10-CM | POA: Diagnosis not present

## 2015-06-21 DIAGNOSIS — S46811A Strain of other muscles, fascia and tendons at shoulder and upper arm level, right arm, initial encounter: Secondary | ICD-10-CM | POA: Diagnosis not present

## 2015-06-22 DIAGNOSIS — S46811A Strain of other muscles, fascia and tendons at shoulder and upper arm level, right arm, initial encounter: Secondary | ICD-10-CM | POA: Diagnosis not present

## 2015-06-27 DIAGNOSIS — S46811A Strain of other muscles, fascia and tendons at shoulder and upper arm level, right arm, initial encounter: Secondary | ICD-10-CM | POA: Diagnosis not present

## 2015-06-29 DIAGNOSIS — S46811A Strain of other muscles, fascia and tendons at shoulder and upper arm level, right arm, initial encounter: Secondary | ICD-10-CM | POA: Diagnosis not present

## 2015-07-04 DIAGNOSIS — S46811A Strain of other muscles, fascia and tendons at shoulder and upper arm level, right arm, initial encounter: Secondary | ICD-10-CM | POA: Diagnosis not present

## 2015-07-05 DIAGNOSIS — S46811A Strain of other muscles, fascia and tendons at shoulder and upper arm level, right arm, initial encounter: Secondary | ICD-10-CM | POA: Diagnosis not present

## 2015-07-06 DIAGNOSIS — S46811A Strain of other muscles, fascia and tendons at shoulder and upper arm level, right arm, initial encounter: Secondary | ICD-10-CM | POA: Diagnosis not present

## 2015-07-13 ENCOUNTER — Ambulatory Visit: Payer: Medicare Other | Admitting: Psychiatry

## 2015-07-20 ENCOUNTER — Encounter: Payer: Self-pay | Admitting: Psychiatry

## 2015-07-20 ENCOUNTER — Ambulatory Visit (INDEPENDENT_AMBULATORY_CARE_PROVIDER_SITE_OTHER): Payer: Medicare Other | Admitting: Psychiatry

## 2015-07-20 VITALS — BP 118/64 | HR 93 | Temp 97.6°F | Ht 60.0 in | Wt 125.0 lb

## 2015-07-20 DIAGNOSIS — F331 Major depressive disorder, recurrent, moderate: Secondary | ICD-10-CM

## 2015-07-20 MED ORDER — VENLAFAXINE HCL ER 75 MG PO CP24: 75.0000 mg | ORAL_CAPSULE | ORAL | Status: DC

## 2015-07-20 MED ORDER — VENLAFAXINE HCL ER 37.5 MG PO CP24: 37.5000 mg | ORAL_CAPSULE | Freq: Every day | ORAL | Status: DC

## 2015-07-20 NOTE — Progress Notes (Signed)
Patient ID: Sandra Brown, female   DOB: April 30, 1926, 82 y.o.   MRN: [id number] [facility] MD/PA/NP OP Progress Note  07/20/2015 11:39 AM Sandra Brown  MRN:  [id number]   Subjective:  Patient returns a follow-up of her major depressive disorder. Patient came to the appointment today with her daughter . She reports feeling more depressed lately, having no energy. She lives in an assisted living facility and states that they take good care of her. States that she has  trouble sleeping but just got a new bed, a little better but still has a difficult time going to sleep. She denies any particular stressors for the increased depression . Denies any suicidal thoughts.    Chief Complaint: more depressed  Chief Complaint    Follow-up; Medication Refill     Visit Diagnosis:     ICD-9-CM ICD-10-CM   1. Major depressive disorder, recurrent episode, moderate (HCC) 296.32 F33.1     Past Medical History:  Past Medical History  Diagnosis Date  . Hearing loss   . Hypertension   . Reflux   . Frequent headaches   . Difficulty swallowing   . Hypothyroidism   . Depression   . Cataract   . COPD (chronic obstructive pulmonary disease) [facility])     Past Surgical History  Procedure Laterality Date  . Total hip arthroplasty      x 4  . Lumbar laminectomy    . Parathyroidectomy    . Appendectomy    . Abdominal hysterectomy     Family History:  Family History  Problem Relation Age of Onset  . Stroke Mother   . Hypertension Mother   . Heart disease Father   . Hypertension Father    Social History:  Social History   Social History  . Marital Status: Widowed    Spouse Name: N/A  . Number of Children: N/A  . Years of Education: N/A   Social History Main Topics  . Smoking status: Never Smoker   . Smokeless tobacco: Never Used     Comment: quit 1954  . Alcohol Use: No  . Drug Use: No  . Sexual Activity: No   Other Topics Concern  . None   Social History Narrative   Additional  History:   Assessment:   Musculoskeletal: Strength & Muscle Tone: She ambulates slowly but without any assistance Gait & Station: normal Patient leans: N/A  Psychiatric Specialty Exam: HPI  Review of Systems  Psychiatric/Behavioral: Negative for depression, suicidal ideas, hallucinations, memory loss and substance abuse. The patient is not nervous/anxious and does not have insomnia.   All other systems reviewed and are negative.   Blood pressure 118/64, pulse 93, temperature 97.6 F (36.4 C), temperature source Tympanic, height 5' (1.524 m), weight 125 lb (56.7 kg), SpO2 93 %.Body mass index is 24.41 kg/(m^2).  General Appearance: Neat and Well Groomed  Eye Contact:  Good  Speech:  Normal Rate  Volume:  Normal  Mood:  depressed  Affect:  smiling  Thought Process:  Linear and Logical  Orientation:  Full (Time, Place, and Person)  Thought Content:  Negative  Suicidal Thoughts:  No  Homicidal Thoughts:  No  Memory:  Immediate;   Good Recent;   Good Remote;   Good  Judgement:  Good  Insight:  Good  Psychomotor Activity:  Negative  Concentration:  Good  Recall:  Good  Fund of Knowledge: Good  Language: Good  Akathisia:  Negative  Handed:  Right unknown   AIMS (  if indicated):  N/A  Assets:  Communication Skills Desire for Improvement Social Support  ADL's:  Intact  Cognition: WNL  Sleep:  disturbed   Is the patient at risk to self?  No. Has the patient been a risk to self in the past 6 months?  No. Has the patient been a risk to self within the distant past?  No. Is the patient a risk to others?  No. Has the patient been a risk to others in the past 6 months?  No. Has the patient been a risk to others within the distant past?  No.  Current Medications: Current Outpatient Prescriptions  Medication Sig Dispense Refill  . acetaminophen (TYLENOL) 500 MG tablet Take 1 tablet (500 mg total) by mouth every 6 (six) hours as needed for moderate pain. Also give 500 mg three  times a day for 7  Days. 60 tablet 0  . ALPRAZolam (XANAX) 0.25 MG tablet Take 1 tablet (0.25 mg total) by mouth daily as needed for anxiety. 30 tablet 3  . AMBULATORY NON FORMULARY MEDICATION Medication Name: incentive spirometry Use as directed 1 each 0  . CALCIUM PO Take by mouth.    . celecoxib (CELEBREX) 200 MG capsule Take 1 capsule (200 mg total) by mouth daily. 30 capsule 0  . CVS RANITIDINE 75 MG tablet   3  . dextromethorphan (DELSYM) 30 MG/5ML liquid Take by mouth 2 (two) times daily as needed for cough.    . esomeprazole (NEXIUM) 40 MG capsule Take 40 mg by mouth.    . Ferrous Fumarate 324 (106 FE) MG TABS TAKE 1 TABLET BY MOUTH EVERY DAY(NOT COVERED BY INS) 90 tablet 3  . fluticasone (FLONASE) 50 MCG/ACT nasal spray USE 2 SPRAYS NASALLY DAILY 48 g 2  . furosemide (LASIX) 40 MG tablet TAKE 1 TABLET IN THE MORNING 90 tablet 3  . guaiFENesin (MUCINEX) 600 MG 12 hr tablet Take 600 mg by mouth 2 (two) times daily.    Marland Kitchen levocetirizine (XYZAL) 5 MG tablet TAKE 1 TABLET AT BEDTIME EVERY NIGHT 90 tablet 1  . levothyroxine (SYNTHROID, LEVOTHROID) 50 MCG tablet TAKE 1 TABLET DAILY 90 tablet 2  . MELATONIN PO Take by mouth daily.    . montelukast (SINGULAIR) 10 MG tablet TAKE 1 TABLET DAILY 90 tablet 2  . Multiple Vitamin (MULTIVITAMIN) tablet Take 1 tablet by mouth daily.    . OSCIMIN 0.125 MG tablet TAKE 1 TABLET TWICE A DAY 180 tablet 0  . potassium chloride (MICRO-K) 10 MEQ CR capsule TAKE 2 CAPSULES DAILY 180 capsule 1  . simvastatin (ZOCOR) 20 MG tablet TAKE 1 TABLET DAILY 90 tablet 3  . SPIRIVA HANDIHALER 18 MCG inhalation capsule INHALE THE CONTENTS OF 1 CAPSULE DAILY 90 capsule 1  . spironolactone (ALDACTONE) 25 MG tablet TAKE 1 TABLET BY MOUTH EVERY DAY 90 tablet 1  . sucralfate (CARAFATE) 1 g tablet TAKE 1 TABLET BY MOUTH 4 TIMES A DAY FOR 7 DAYS THEN TWICE A DAY 60 tablet 0  . tamsulosin (FLOMAX) 0.4 MG CAPS capsule TAKE 1 CAPSULE DAILY 90 capsule 1  . venlafaxine XR  (EFFEXOR-XR) 75 MG 24 hr capsule Take 1 capsule (75 mg total) by mouth every morning. 90 capsule 1   No current facility-administered medications for this visit.    Medical Decision Making:  Established Problem, Stable/Improving (1) and Review of New Medication or Change in Dosage (2)  Treatment Plan Summary:Medication management and Plan   Major depressive disorder, recurrent, moderate.  Increase  Effexor 112.5 mg, and made aware of the side effects and was told to go back to the 75 mg if she begins to feel more depressed or more anxious on this new dosage. Patient urged to see a therapist and she is interested in following up. She was given a list of therapists  Continue  alprazolam .25 mg QD prn anxiety. She takes it about 2-3 times a month.  Patient will follow up in 2 months.  She has been encouraged to call the clinic with any questions or concerns.  Sandra Brown 07/20/2015, 11:39 AM

## 2015-07-24 DIAGNOSIS — S46811A Strain of other muscles, fascia and tendons at shoulder and upper arm level, right arm, initial encounter: Secondary | ICD-10-CM | POA: Diagnosis not present

## 2015-07-26 DIAGNOSIS — S46811A Strain of other muscles, fascia and tendons at shoulder and upper arm level, right arm, initial encounter: Secondary | ICD-10-CM | POA: Diagnosis not present

## 2015-07-28 DIAGNOSIS — S46811A Strain of other muscles, fascia and tendons at shoulder and upper arm level, right arm, initial encounter: Secondary | ICD-10-CM | POA: Diagnosis not present

## 2015-08-18 ENCOUNTER — Other Ambulatory Visit: Payer: Self-pay

## 2015-08-18 MED ORDER — CELECOXIB 200 MG PO CAPS: 200.0000 mg | ORAL_CAPSULE | Freq: Every day | ORAL | 1 refills | Status: DC

## 2015-08-23 NOTE — Telephone Encounter (Signed)
error 

## 2015-08-25 DIAGNOSIS — X32XXXA Exposure to sunlight, initial encounter: Secondary | ICD-10-CM | POA: Diagnosis not present

## 2015-08-25 DIAGNOSIS — L814 Other melanin hyperpigmentation: Secondary | ICD-10-CM | POA: Diagnosis not present

## 2015-08-25 DIAGNOSIS — L281 Prurigo nodularis: Secondary | ICD-10-CM | POA: Diagnosis not present

## 2015-08-25 DIAGNOSIS — L821 Other seborrheic keratosis: Secondary | ICD-10-CM | POA: Diagnosis not present

## 2015-08-27 ENCOUNTER — Other Ambulatory Visit: Payer: Self-pay | Admitting: Family Medicine

## 2015-08-27 DIAGNOSIS — E785 Hyperlipidemia, unspecified: Secondary | ICD-10-CM

## 2015-08-27 DIAGNOSIS — E039 Hypothyroidism, unspecified: Secondary | ICD-10-CM

## 2015-08-27 DIAGNOSIS — R600 Localized edema: Secondary | ICD-10-CM

## 2015-08-28 ENCOUNTER — Ambulatory Visit: Payer: Medicare Other | Attending: Pain Medicine | Admitting: Pain Medicine

## 2015-08-28 ENCOUNTER — Encounter: Payer: Self-pay | Admitting: Pain Medicine

## 2015-08-28 VITALS — BP 125/55 | HR 97 | Temp 98.4°F | Resp 16 | Ht 60.0 in | Wt 124.0 lb

## 2015-08-28 DIAGNOSIS — M19019 Primary osteoarthritis, unspecified shoulder: Secondary | ICD-10-CM | POA: Diagnosis not present

## 2015-08-28 DIAGNOSIS — M533 Sacrococcygeal disorders, not elsewhere classified: Secondary | ICD-10-CM

## 2015-08-28 DIAGNOSIS — M62830 Muscle spasm of back: Secondary | ICD-10-CM | POA: Diagnosis not present

## 2015-08-28 DIAGNOSIS — M19012 Primary osteoarthritis, left shoulder: Secondary | ICD-10-CM | POA: Insufficient documentation

## 2015-08-28 DIAGNOSIS — M5030 Other cervical disc degeneration, unspecified cervical region: Secondary | ICD-10-CM

## 2015-08-28 DIAGNOSIS — M791 Myalgia: Secondary | ICD-10-CM | POA: Insufficient documentation

## 2015-08-28 DIAGNOSIS — M19011 Primary osteoarthritis, right shoulder: Secondary | ICD-10-CM | POA: Insufficient documentation

## 2015-08-28 DIAGNOSIS — M5416 Radiculopathy, lumbar region: Secondary | ICD-10-CM | POA: Diagnosis not present

## 2015-08-28 DIAGNOSIS — M79604 Pain in right leg: Secondary | ICD-10-CM | POA: Diagnosis present

## 2015-08-28 DIAGNOSIS — M79605 Pain in left leg: Secondary | ICD-10-CM | POA: Diagnosis present

## 2015-08-28 DIAGNOSIS — M47817 Spondylosis without myelopathy or radiculopathy, lumbosacral region: Secondary | ICD-10-CM | POA: Diagnosis not present

## 2015-08-28 DIAGNOSIS — B354 Tinea corporis: Secondary | ICD-10-CM

## 2015-08-28 DIAGNOSIS — M5481 Occipital neuralgia: Secondary | ICD-10-CM | POA: Insufficient documentation

## 2015-08-28 DIAGNOSIS — M546 Pain in thoracic spine: Secondary | ICD-10-CM | POA: Diagnosis present

## 2015-08-28 DIAGNOSIS — M7918 Myalgia, other site: Secondary | ICD-10-CM

## 2015-08-28 DIAGNOSIS — M5136 Other intervertebral disc degeneration, lumbar region: Secondary | ICD-10-CM | POA: Diagnosis not present

## 2015-08-28 DIAGNOSIS — M7550 Bursitis of unspecified shoulder: Secondary | ICD-10-CM

## 2015-08-28 DIAGNOSIS — M7060 Trochanteric bursitis, unspecified hip: Secondary | ICD-10-CM

## 2015-08-28 DIAGNOSIS — M47816 Spondylosis without myelopathy or radiculopathy, lumbar region: Secondary | ICD-10-CM

## 2015-08-28 NOTE — Progress Notes (Signed)
Patient here today for evaluation of right shoulder pain that flares up intermittently for approximately the past 6 months.  Patient is a resident at [facility] in their assisted living program.    Safety precautions to be maintained throughout the outpatient stay will include: orient to surroundings, keep bed in low position, maintain call bell within reach at all times, provide assistance with transfer out of bed and ambulation.

## 2015-08-28 NOTE — Telephone Encounter (Signed)
Dr. Sharyon Medicus pt. Has appointment scheduled with you 09/18/2015. Renaldo Fiddler, CMA

## 2015-08-28 NOTE — Patient Instructions (Addendum)
PLAN   Continue present medication  Shoulder injection to be performed at time of return appointment  F/U PCP Dr. Rosanna Randy for evaliation of  BP and general medical  condition  F/U surgical evaluation. May consider pending follow-up evaluations  F/U neurological evaluation. May consider pending follow-up evaluations  Physical therapy as discussed. Please exercise with caution to avoid aggravation of symptoms  May consider radiofrequency rhizolysis or intraspinal procedures pending response to present treatment and F/U evaluation . We will avoid considering such treatment at this time  Patient to call [facility] should patient have concerns prior to scheduled return appointment. Trigger Point Injection Trigger points are areas where you have muscle pain. A trigger point injection is a shot given in the trigger point to relieve that pain. A trigger point might feel like a knot in your muscle. It hurts to press on a trigger point. Sometimes the pain spreads out (radiates) to other parts of the body. For example, pressing on a trigger point in your shoulder might cause pain in your arm or neck. You might have one trigger point. Or, you might have more than one. People often have trigger points in their upper back and lower back. They also occur often in the neck and shoulders. Pain from a trigger point lasts for a long time. It can make it hard to keep moving. You might not be able to do the exercise or physical therapy that could help you deal with the pain. A trigger point injection may help. It does not work for everyone. But, it may relieve your pain for a few days or a few months. A trigger point injection does not cure long-lasting (chronic) pain. LET YOUR CAREGIVER KNOW ABOUT:  Any allergies (especially to latex, lidocaine, or steroids).  Blood-thinning medicines that you take. These drugs can lead to bleeding or bruising after an injection. They  include:  Aspirin.  Ibuprofen.  Clopidogrel.  Warfarin.  Other medicines you take. This includes all vitamins, herbs, eyedrops, over-the-counter medicines, and creams.  Use of steroids.  Recent infections.  Past problems with numbing medicines.  Bleeding problems.  Surgeries you have had.  Other health problems. RISKS AND COMPLICATIONS A trigger point injection is a safe treatment. However, problems may develop, such as:  Minor side effects usually go away in 1 to 2 days. These may include:  Soreness.  Bruising.  Stiffness.  More serious problems are rare. But, they may include:  Bleeding under the skin (hematoma).  Skin infection.  Breaking off of the needle under your skin.  Lung puncture.  The trigger point injection may not work for you. BEFORE THE PROCEDURE You may need to stop taking any medicine that thins your blood. This is to prevent bleeding and bruising. Usually these medicines are stopped several days before the injection. No other preparation is needed. PROCEDURE  A trigger point injection can be given in your caregiver's office or in a clinic. Each injection takes 2 minutes or less.  Your caregiver will feel for trigger points. The caregiver may use a marker to circle the area for the injection.  The skin over the trigger point will be washed with a germ-killing (antiseptic) solution.  The caregiver pinches the spot for the injection.  Then, a very thin needle is used for the shot. You may feel pain or a twitching feeling when the needle enters the trigger point.  A numbing solution may be injected into the trigger point. Sometimes a drug to keep  down swelling, redness, and warmth (inflammation) is also injected.  Your caregiver moves the needle around the trigger zone until the tightness and twitching goes away.  After the injection, your caregiver may put gentle pressure over the injection site.  Then it is covered with a  bandage. AFTER THE PROCEDURE  You can go right home after the injection.  The bandage can be taken off after a few hours.  You may feel sore and stiff for 1 to 2 days.  Go back to your regular activities slowly. Your caregiver may ask you to stretch your muscles. Do not do anything that takes extra energy for a few days.  Follow your caregiver's instructions to manage and treat other pain.   This information is not intended to replace advice given to you by your health care provider. Make sure you discuss any questions you have with your health care provider.   Document Released: 12/13/2010 Document Revised: 04/20/2012 Document Reviewed: 12/13/2010 Elsevier Interactive Patient Education 2016 [place]  What are the risk, side effects and possible complications? Generally speaking, most procedures are safe.  However, with any procedure there are risks, side effects, and the possibility of complications.  The risks and complications are dependent upon the sites that are lesioned, or the type of nerve block to be performed.  The closer the procedure is to the spine, the more serious the risks are.  Great care is taken when placing the radio frequency needles, block needles or lesioning probes, but sometimes complications can occur. 1. Infection: Any time there is an injection through the skin, there is a risk of infection.  This is why sterile conditions are used for these blocks.  There are four possible types of infection. 1. Localized skin infection. 2. Central Nervous System Infection-This can be in the form of Meningitis, which can be deadly. 3. Epidural Infections-This can be in the form of an epidural abscess, which can cause pressure inside of the spine, causing compression of the spinal cord with subsequent paralysis. This would require an emergency surgery to decompress, and there are no guarantees that the patient would recover from the  paralysis. 4. Discitis-This is an infection of the intervertebral discs.  It occurs in about 1% of discography procedures.  It is difficult to treat and it may lead to surgery.        2. Pain: the needles have to go through skin and soft tissues, will cause soreness.       3. Damage to internal structures:  The nerves to be lesioned may be near blood vessels or    other nerves which can be potentially damaged.       4. Bleeding: Bleeding is more common if the patient is taking blood thinners such as  aspirin, Coumadin, Ticiid, Plavix, etc., or if he/she have some genetic predisposition  such as hemophilia. Bleeding into the spinal canal can cause compression of the spinal  cord with subsequent paralysis.  This would require an emergency surgery to  decompress and there are no guarantees that the patient would recover from the  paralysis.       5. Pneumothorax:  Puncturing of a lung is a possibility, every time a needle is introduced in  the area of the chest or upper back.  Pneumothorax refers to free air around the  collapsed lung(s), inside of the thoracic cavity (chest cavity).  Another two possible  complications related to a similar event would include: Hemothorax  and Chylothorax.   These are variations of the Pneumothorax, where instead of air around the collapsed  lung(s), you may have blood or chyle, respectively.       6. Spinal headaches: They may occur with any procedures in the area of the spine.       7. Persistent CSF (Cerebro-Spinal Fluid) leakage: This is a rare problem, but may occur  with prolonged intrathecal or epidural catheters either due to the formation of a fistulous  track or a dural tear.       8. Nerve damage: By working so close to the spinal cord, there is always a possibility of  nerve damage, which could be as serious as a permanent spinal cord injury with  paralysis.       9. Death:  Although rare, severe deadly allergic reactions known as "Anaphylactic  reaction" can  occur to any of the medications used.      10. Worsening of the symptoms:  We can always make thing worse.  What are the chances of something like this happening? Chances of any of this occuring are extremely low.  By statistics, you have more of a chance of getting killed in a motor vehicle accident: while driving to the hospital than any of the above occurring .  Nevertheless, you should be aware that they are possibilities.  In general, it is similar to taking a shower.  Everybody knows that you can slip, hit your head and get killed.  Does that mean that you should not shower again?  Nevertheless always keep in mind that statistics do not mean anything if you happen to be on the wrong side of them.  Even if a procedure has a 1 (one) in a 1,000,000 (million) chance of going wrong, it you happen to be that one..Also, keep in mind that by statistics, you have more of a chance of having something go wrong when taking medications.  Who should not have this procedure? If you are on a blood thinning medication (e.g. Coumadin, Plavix, see list of "Blood Thinners"), or if you have an active infection going on, you should not have the procedure.  If you are taking any blood thinners, please inform your physician.  How should I prepare for this procedure?  Do not eat or drink anything at least six hours prior to the procedure.  Bring a driver with you .  It cannot be a taxi.  Come accompanied by an adult that can drive you back, and that is strong enough to help you if your legs get weak or numb from the local anesthetic.  Take all of your medicines the morning of the procedure with just enough water to swallow them.  If you have diabetes, make sure that you are scheduled to have your procedure done first thing in the morning, whenever possible.  If you have diabetes, take only half of your insulin dose and notify our nurse that you have done so as soon as you arrive at the clinic.  If you are  diabetic, but only take blood sugar pills (oral hypoglycemic), then do not take them on the morning of your procedure.  You may take them after you have had the procedure.  Do not take aspirin or any aspirin-containing medications, at least eleven (11) days prior to the procedure.  They may prolong bleeding.  Wear loose fitting clothing that may be easy to take off and that you would not mind if it got stained with  Betadine or blood.  Do not wear any jewelry or perfume  Remove any nail coloring.  It will interfere with some of our monitoring equipment.  NOTE: Remember that this is not meant to be interpreted as a complete list of all possible complications.  Unforeseen problems may occur.  BLOOD THINNERS The following drugs contain aspirin or other products, which can cause increased bleeding during surgery and should not be taken for 2 weeks prior to and 1 week after surgery.  If you should need take something for relief of minor pain, you may take acetaminophen which is found in Tylenol,m Datril, Anacin-3 and Panadol. It is not blood thinner. The products listed below are.  Do not take any of the products listed below in addition to any listed on your instruction sheet.  A.P.C or A.P.C with Codeine Codeine Phosphate Capsules #3 Ibuprofen Ridaura  ABC compound Congesprin Imuran rimadil  Advil Cope Indocin Robaxisal  Alka-Seltzer Effervescent Pain Reliever and Antacid Coricidin or Coricidin-D  Indomethacin Rufen  Alka-Seltzer plus Cold Medicine Cosprin Ketoprofen S-A-C Tablets  Anacin Analgesic Tablets or Capsules Coumadin Korlgesic Salflex  Anacin Extra Strength Analgesic tablets or capsules CP-2 Tablets Lanoril Salicylate  Anaprox Cuprimine Capsules Levenox Salocol  Anexsia-D Dalteparin Magan Salsalate  Anodynos Darvon compound Magnesium Salicylate Sine-off  Ansaid Dasin Capsules Magsal Sodium Salicylate  Anturane Depen Capsules Marnal Soma  APF Arthritis pain formula Dewitt's Pills  Measurin Stanback  Argesic Dia-Gesic Meclofenamic Sulfinpyrazone  Arthritis Bayer Timed Release Aspirin Diclofenac Meclomen Sulindac  Arthritis pain formula Anacin Dicumarol Medipren Supac  Analgesic (Safety coated) Arthralgen Diffunasal Mefanamic Suprofen  Arthritis Strength Bufferin Dihydrocodeine Mepro Compound Suprol  Arthropan liquid Dopirydamole Methcarbomol with Aspirin Synalgos  ASA tablets/Enseals Disalcid Micrainin Tagament  Ascriptin Doan's Midol Talwin  Ascriptin A/D Dolene Mobidin Tanderil  Ascriptin Extra Strength Dolobid Moblgesic Ticlid  Ascriptin with Codeine Doloprin or Doloprin with Codeine Momentum Tolectin  Asperbuf Duoprin Mono-gesic Trendar  Aspergum Duradyne Motrin or Motrin IB Triminicin  Aspirin plain, buffered or enteric coated Durasal Myochrisine Trigesic  Aspirin Suppositories Easprin Nalfon Trillsate  Aspirin with Codeine Ecotrin Regular or Extra Strength Naprosyn Uracel  Atromid-S Efficin Naproxen Ursinus  Auranofin Capsules Elmiron Neocylate Vanquish  Axotal Emagrin Norgesic Verin  Azathioprine Empirin or Empirin with Codeine Normiflo Vitamin E  Azolid Emprazil Nuprin Voltaren  Bayer Aspirin plain, buffered or children's or timed BC Tablets or powders Encaprin Orgaran Warfarin Sodium  Buff-a-Comp Enoxaparin Orudis Zorpin  Buff-a-Comp with Codeine Equegesic Os-Cal-Gesic   Buffaprin Excedrin plain, buffered or Extra Strength Oxalid   Bufferin Arthritis Strength Feldene Oxphenbutazone   Bufferin plain or Extra Strength Feldene Capsules Oxycodone with Aspirin   Bufferin with Codeine Fenoprofen Fenoprofen Pabalate or Pabalate-SF   Buffets II Flogesic Panagesic   Buffinol plain or Extra Strength Florinal or Florinal with Codeine Panwarfarin   Buf-Tabs Flurbiprofen Penicillamine   Butalbital Compound Four-way cold tablets Penicillin   Butazolidin Fragmin Pepto-Bismol   Carbenicillin Geminisyn Percodan   Carna Arthritis Reliever Geopen Persantine    Carprofen Gold's salt Persistin   Chloramphenicol Goody's Phenylbutazone   Chloromycetin Haltrain Piroxlcam   Clmetidine heparin Plaquenil   Cllnoril Hyco-pap Ponstel   Clofibrate Hydroxy chloroquine Propoxyphen         Before stopping any of these medications, be sure to consult the physician who ordered them.  Some, such as Coumadin (Warfarin) are ordered to prevent or treat serious conditions such as "deep thrombosis", "pumonary embolisms", and other heart problems.  The amount of time that you may  need off of the medication may also vary with the medication and the reason for which you were taking it.  If you are taking any of these medications, please make sure you notify your pain physician before you undergo any procedures.

## 2015-08-28 NOTE — Progress Notes (Signed)
     The patient is an 82 year old female who returns to pain management for further evaluation and treatment of pain involving the region of the entire back upper and lower extremity regions. The patient states that she has significant pain involving the region of the shoulder upper back and trapezius musculature regions. The patient states that there is significant spasms occurring in the region of the upper back region that interferes with ability to perform region lifting pushing pulling maneuvers. The patient has undergone physical therapy and exercises and continues to be with severely disabling pain. We discussed patient's condition and will proceed with injection in the region of the shoulder and trapezius musculature regions at time return appointment in attempt to decrease severity of symptoms, minimize progression of symptoms, and avoid need for more involved treatment. The patient was with understanding and agreed to suggested treatment plan. The patient denied any trauma change in events of daily living the call significant change in symptomatology.    Physical examination  There was tenderness to palpation of the splenius capitis and occipitalis region palpation which reproduces mild discomfort. There was mild tenderness over the region of the cervical facet cervical paraspinal musculature region and thoracic facet thoracic paraspinal musculature region. Palpation of the acromioclavicular and glenohumeral joint regions reproduce moderate discomfort. There was limited range of motion of the shoulder noted and patient had difficulty performing the drop test. The patient appeared to be with slightly decreased grip strength with Tinel and Phalen's maneuver reproducing mild discomfort. The patient was with tenderness of the trapezius levator scapula and rhomboid musculature region of moderate to moderately severe degree. No crepitus of the thoracic region was noted. Palpation over the region of the  lumbar region was with increased pain with lateral bending rotation extension and palpation over the lumbar facets with palpation over the PSIS and PII S regions reproducing mild to moderate discomfort. There was no sensory deficit of dermatomal distribution detected. The knees were with tenderness to palpation of mild to moderate degree. There was no sensory deficit or dermatomal distribution detected. There was negative clonus negative Homans. Abdomen was nontender with no costovertebral angle tenderness noted.     Assessment  Degenerative joint disease of shoulders  Myofascial pain  Degenerative disc disease cervical spine  Bilateral occipital neuralgia  Degenerative joint disease of shoulder  Degenerative disc disease lumbar spine  Lumbar facet syndrome  Sacroiliac joint dysfunction     PLAN   Continue present medication  Shoulder injection to be performed at time of return appointment  F/U PCP Dr. Rosanna Randy for evaliation of  BP and general medical  condition  F/U surgical evaluation. May consider pending follow-up evaluations  F/U neurological evaluation. May consider pending follow-up evaluations  Physical therapy as discussed. Please exercise with caution to avoid aggravation of symptoms  May consider radiofrequency rhizolysis or intraspinal procedures pending response to present treatment and F/U evaluation . We will avoid considering such treatment at this time  Patient to call [facility] Center should patient have concerns prior to scheduled return appointment.

## 2015-09-01 ENCOUNTER — Ambulatory Visit (INDEPENDENT_AMBULATORY_CARE_PROVIDER_SITE_OTHER): Payer: Medicare Other | Admitting: Internal Medicine

## 2015-09-01 ENCOUNTER — Encounter: Payer: Self-pay | Admitting: Internal Medicine

## 2015-09-01 VITALS — BP 120/58 | HR 87 | Ht 60.0 in | Wt 125.0 lb

## 2015-09-01 DIAGNOSIS — J449 Chronic obstructive pulmonary disease, unspecified: Secondary | ICD-10-CM | POA: Diagnosis not present

## 2015-09-01 NOTE — Progress Notes (Signed)
MRN# [id number] Sandra Brown [birth year]   CC: Chief Complaint  Patient presents with  . Follow-up    pt denies any breathing complaints today.  Pt has not been in ED or seen PCP for breathing-related issues since last OV.        Brief History: 07/06/14 HPI:  Patient is a pleasant 82 year old female accompanied by her granddaughter today for further evaluation of shortness of breath with desaturation during physical therapy. Further history in reviewing of records showed that the patient has had recurrent upper respiratory tract infections since the beginning of the year, about 5-6, requiring anti-biotics and steroids. Patient states she has a history of chronic sinusitis, most recent upper respiratory tract infection was 2 weeks ago that required antibiotic only. Given the recurrence of upper respiratory tract infections have physical therapy has been more troublesome in terms of her desaturating to the mid to low 80s. Patient stated back in December 2015 she had a episode of lower extremity bilateral cellulitis, but that had no relation to her breathing issues that she could link. She does endorse a cough that's mild with thick white sputum, chronic shortness of breath, worsening dyspnea on exertion. Patient experimented with tobacco during her teenage years for about 2 years. 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 She had chronic secondhand smoke exposure from her husband for about 30 years She is currently on Spiriva Plan: CT chest, PFTs, 6 minute walk test   Events since last clinic visit: Patient presents today for follow-up visit of COPD. She is accompanied by her daughter. She endorses mild white/green sputum production, which she states is normal for her, there is no change in color of her sputum or amount. Overall patient states that she is in stable respiratory status. Follow with GI for GERD. Overall with stable pulmonary status.  Educated on gargle and  rinsing after use of inhalers, patient had concerns about thrush     Review of Systems: Gen:  Denies  fever, sweats, chills HEENT: Denies blurred vision, double vision, ear pain, eye pain, hearing loss, nose bleeds, sore throat Cvc:  No dizziness, chest pain or heaviness Resp:   Admits [id number] sputum production and shortness of breath Gi: Denies swallowing difficulty, stomach pain, nausea or vomiting, diarrhea, constipation, bowel incontinence Gu:  Denies bladder incontinence, burning urine Ext:   No Joint pain, stiffness or swelling Skin: No skin rash, easy bruising or bleeding or hives Endoc:  No polyuria, polydipsia , polyphagia or weight change Other:  All other systems negative  Allergies:  Sulfa antibiotics; Erythromycin; Aspirin; Contrast media [iodinated diagnostic agents]; Latex; Penicillin g; Penicillins; and Tape  Physical Examination:  VS: BP (!) 120/58 (BP Location: Left Arm, Cuff Size: Normal)   Pulse 87   Ht 5' (1.524 m)   Wt 125 lb (56.7 kg)   SpO2 99%   BMI 24.41 kg/m   General Appearance: No distress  HEENT: PERRLA, no ptosis, mild Thrush noted in the posterior right oropharynx Pulmonary: Mild decreased breath sounds at the bilateral bases, some fine rales at the left lower base, otherwise no significant changes from last exam Cardiovascular:  Normal S1,S2.  No m/r/g.     Abdomen:Exam: Benign, Soft, non-tender, No masses  Skin:   warm, no rashes, no ecchymosis  Extremities: normal, no cyanosis, clubbing, warm with normal capillary refill.      Rad results: (The following images and results were reviewed by Dr. Stevenson Clinch). CT Chest 07/2014 CT CHEST WITHOUT CONTRAST  TECHNIQUE: Multidetector CT imaging of the chest was performed following the standard protocol without IV contrast.  COMPARISON: Chest radiograph August 19, 2012  FINDINGS: There is a degree of underlying centrilobular emphysematous change. There is scarring in each lung base. There is  no edema or consolidation. There is mild fibrotic change in the anterior left lung base.  Thyroid appears unremarkable. There is no appreciable thoracic adenopathy.  There is extensive atherosclerotic calcification in the aorta. There is no thoracic aortic aneurysm. There is extensive coronary artery calcification. The pericardium is not thickened.  There is a sizable hiatal type hernia.  In the visualized upper abdomen, gallbladder appears mildly distended. Gallbladder is incompletely visualized. There is extensive atherosclerotic change in the aorta and visualized major mesenteric vessels.  There is degenerative change throughout the thoracic spine. There is anterior wedging of the T5 vertebral body. There is antral listhesis of C7 on T1. There are no blastic or lytic bone lesions. There is extensive osteoarthritic change throughout the cervical and thoracic spine. There is advanced arthropathy in the right shoulder.  IMPRESSION: Areas of lung scarring in the bases with mild interstitial fibrosis in the anterior left base. Underlying centrilobular emphysematous type change. No edema or consolidation.  Extensive atherosclerotic change. Widespread coronary artery calcification is noted.  No appreciable adenopathy.  Sizable hiatal type hernia.  Gallbladder incompletely visualized but does appear somewhat distended. Visualized gallbladder wall is not thickened.  Extensive bony changes. Note grade II/IV anterolisthesis of C7 on T1.    Assessment and Plan: 82 year old female with history of recurrent pulmonary infections and COPD seen for follow-up visit COPD (chronic obstructive pulmonary disease) Known diagnosis COPD, most likely secondary to chronic secondhand smoke exposure. Her kyphosis does add to discomfort and impaired some chest wall mechanics of breathing which can exacerbate or amplify shortness of breath. Her CT scan does show centrilobular emphysema,  and some mild pulmonary fibrosis at the bases. Today she is doing well, no worsening symptoms. Continue incentive spirometry daily and to continue with Spiriva  Plan: - Continue with Spiriva - Avoid any allergens or triggers - For her chronic sinusitis - recommend nettipot sinus rinse 1-2 times per week - incentive spirometry 10-15 times per day.        Updated Medication List Outpatient Encounter Prescriptions as of 09/01/2015  Medication Sig  . acetaminophen (TYLENOL) 500 MG tablet Take 1 tablet (500 mg total) by mouth every 6 (six) hours as needed for moderate pain. Also give 500 mg three times a day for 7  Days.  . ALPRAZolam (XANAX) 0.25 MG tablet Take 1 tablet (0.25 mg total) by mouth daily as needed for anxiety.  . AMBULATORY NON FORMULARY MEDICATION Medication Name: incentive spirometry Use as directed  . CALCIUM PO Take by mouth.  . celecoxib (CELEBREX) 200 MG capsule Take 1 capsule (200 mg total) by mouth daily.  . [employer] RANITIDINE 75 MG tablet   . dexlansoprazole (DEXILANT) 60 MG capsule Take 60 mg by mouth daily.  Marland Kitchen dextromethorphan (DELSYM) 30 MG/5ML liquid Take by mouth 2 (two) times daily as needed for cough.  . esomeprazole (NEXIUM) 40 MG capsule Take 40 mg by mouth.  . Ferrous Fumarate 324 (106 FE) MG TABS TAKE 1 TABLET BY MOUTH EVERY DAY(NOT COVERED BY INS)  . fluticasone (FLONASE) 50 MCG/ACT nasal spray USE 2 SPRAYS NASALLY DAILY  . furosemide (LASIX) 40 MG tablet TAKE 1 TABLET IN THE MORNING  . guaiFENesin (MUCINEX) 600 MG 12 hr tablet Take 400 mg by  mouth 2 (two) times daily.   Marland Kitchen levocetirizine (XYZAL) 5 MG tablet TAKE 1 TABLET AT BEDTIME EVERY NIGHT  . levothyroxine (SYNTHROID, LEVOTHROID) 50 MCG tablet TAKE 1 TABLET DAILY  . loperamide (IMODIUM) 2 MG capsule Take 2 mg by mouth as needed for diarrhea or loose stools.  . magnesium gluconate (MAGONATE) 500 MG tablet Take 500 mg by mouth daily.  . meclizine (ANTIVERT) 12.5 MG tablet Take 12.5 mg by mouth.  .  MELATONIN PO Take by mouth daily.  . montelukast (SINGULAIR) 10 MG tablet TAKE 1 TABLET DAILY  . Multiple Vitamin (MULTIVITAMIN) tablet Take 1 tablet by mouth daily.  . OSCIMIN 0.125 MG tablet TAKE 1 TABLET TWICE A DAY  . potassium chloride (MICRO-K) 10 MEQ CR capsule TAKE 2 CAPSULES DAILY  . Probiotic Product (PROBIOTIC COLON SUPPORT PO) Take 1 capsule by mouth daily.  . simvastatin (ZOCOR) 20 MG tablet TAKE 1 TABLET DAILY  . SPIRIVA HANDIHALER 18 MCG inhalation capsule INHALE THE CONTENTS OF 1 CAPSULE DAILY  . spironolactone (ALDACTONE) 25 MG tablet TAKE 1 TABLET BY MOUTH EVERY DAY  . sucralfate (CARAFATE) 1 g tablet TAKE 1 TABLET BY MOUTH 4 TIMES A DAY FOR 7 DAYS THEN TWICE A DAY (Patient not taking: Reported on 08/28/2015)  . tamsulosin (FLOMAX) 0.4 MG CAPS capsule TAKE 1 CAPSULE DAILY  . venlafaxine XR (EFFEXOR XR) 37.5 MG 24 hr capsule Take 1 capsule (37.5 mg total) by mouth daily.  Marland Kitchen venlafaxine XR (EFFEXOR-XR) 75 MG 24 hr capsule Take 1 capsule (75 mg total) by mouth every morning.   No facility-administered encounter medications on file as of 09/01/2015.     Orders for this visit: No orders of the defined types were placed in this encounter.   Thank  you for the visitation and for allowing  [facility] to assist in the care of your patient. Our recommendations are noted above.  Please contact us if we can be of further service.  Vilinda Boehringer, MD [facility] Office Number: [phone]

## 2015-09-01 NOTE — Patient Instructions (Addendum)
Follow up with Dr. Stevenson Clinch in: 6 months - cont with spiriva daily - gargle and rinse after each use - may use Nettipot sinus Rinse 1-2 times per week for chronic sinusitis - Mucinex as needed daily for cough, sinus congestion - incentive spirometry daily (10-15 times).

## 2015-09-01 NOTE — Assessment & Plan Note (Addendum)
Known diagnosis COPD, most likely secondary to chronic secondhand smoke exposure. Her kyphosis does add to discomfort and impaired some chest wall mechanics of breathing which can exacerbate or amplify shortness of breath. Her CT scan does show centrilobular emphysema, and some mild pulmonary fibrosis at the bases. Today she is doing well, no worsening symptoms. Continue incentive spirometry daily and to continue with Spiriva  Plan: - Continue with Spiriva - Avoid any allergens or triggers - For her chronic sinusitis - recommend nettipot sinus rinse 1-2 times per week - incentive spirometry 10-15 times per day.

## 2015-09-04 ENCOUNTER — Encounter: Payer: Self-pay | Admitting: Pain Medicine

## 2015-09-04 ENCOUNTER — Ambulatory Visit: Payer: Medicare Other | Attending: Pain Medicine | Admitting: Pain Medicine

## 2015-09-04 VITALS — BP 140/61 | HR 96 | Temp 97.7°F | Resp 18 | Ht 60.0 in | Wt 125.0 lb

## 2015-09-04 DIAGNOSIS — M25511 Pain in right shoulder: Secondary | ICD-10-CM | POA: Diagnosis not present

## 2015-09-04 DIAGNOSIS — M533 Sacrococcygeal disorders, not elsewhere classified: Secondary | ICD-10-CM

## 2015-09-04 DIAGNOSIS — M19011 Primary osteoarthritis, right shoulder: Secondary | ICD-10-CM

## 2015-09-04 DIAGNOSIS — M19012 Primary osteoarthritis, left shoulder: Secondary | ICD-10-CM

## 2015-09-04 DIAGNOSIS — M7060 Trochanteric bursitis, unspecified hip: Secondary | ICD-10-CM

## 2015-09-04 MED ORDER — BUPIVACAINE HCL (PF) 0.25 % IJ SOLN
30.0000 mL | Freq: Once | INTRAMUSCULAR | Status: AC
  Administered 2015-09-04: 30 mL

## 2015-09-04 MED ORDER — ORPHENADRINE CITRATE 30 MG/ML IJ SOLN: 60.0000 mg | Freq: Once | INTRAMUSCULAR | Status: DC

## 2015-09-04 MED ORDER — TRIAMCINOLONE ACETONIDE 40 MG/ML IJ SUSP
INTRAMUSCULAR | Status: AC
  Filled 2015-09-04: qty 1

## 2015-09-04 MED ORDER — BUPIVACAINE HCL (PF) 0.25 % IJ SOLN
INTRAMUSCULAR | Status: AC
  Filled 2015-09-04: qty 30

## 2015-09-04 MED ORDER — ORPHENADRINE CITRATE 30 MG/ML IJ SOLN
INTRAMUSCULAR | Status: AC
  Filled 2015-09-04: qty 2

## 2015-09-04 MED ORDER — TRIAMCINOLONE ACETONIDE 40 MG/ML IJ SUSP
40.0000 mg | Freq: Once | INTRAMUSCULAR | Status: AC
  Administered 2015-09-04: 40 mg

## 2015-09-04 NOTE — Progress Notes (Signed)
                                                                   RIGHT SHOULDER INJECTION    The patient is an 82 -year-old female who returns to pain management for further evaluation and treatment of pain involving the region of the right shoulder. The patient is with prior studies revealing patient to be with degenerative changes of the shoulder. The risks benefits and expectations of procedure have been discussed and explained to patient who is with understanding and wishes to proceed with procedure in attempt to decrease severity of symptoms, minimize progression of symptoms, and avoid the need for more involved treatment are in agreement to proceed with shoulder injection as planned    Description Of Procedure:  Right Shoulder Injection (Anterior Approach)  With the patient in sitting position with EKG, blood pressure, pulse, and pulse oximetry monitoring all in place, Betadine prep of proposed entry site was accomplished following identification of landmarks for right shoulder injection anterior approach. Following identification of landmarks for shoulder injection anterior approach, a 22-gauge needle was inserted in the anterior region of the right shoulder and 2 cc of 0.25% bupivacaine with Kenalog was injected for right shoulder injection anterior approach. The patient tolerated the injection well  Description Of Procedure  right Shoulder Injection (Posterior Approach)  With the patient in the sitting position with EKG, blood pressure, pulse, and pulse oximetry monitoring all in place, Betadine prep of proposed entry site was accomplished following identification of landmarks for right shoulder injection posterior approach. Following identification of landmarks for shoulder injection posterior approach, a 22-gauge needle was inserted in the region of the right shoulder and 2 cc of 0.25% bupivacaine with Kenalog was injected for right shoulder injection posterior approach. The patient  tolerated the injection well.  Myoneural block injections of the trapezius musculature region Following Betadine prep of proposed entry site a 22-gauge needle was inserted into the trapezius musculature region and following negative aspiration 2 cc of 0.25% bupivacaine with Kenalog and Norflex was injected for myoneural block injections of the trapezius musculature region 4.  The patient tolerated the procedure well.  A total of 20 mg of Kenalog was utilized for the entire procedure.    PLAN:     Continue present medication  F/U PCP  Dr.  Rosanna Randy for evaliation of  BP and general medical  condition  F/U surgical evaluation. May consider pending follow-up evaluations. Patient prefers to avoid surgical intervention  F/U neurological evaluation. May consider pending follow-up evaluations  May consider radiofrequency rhizolysis or intraspinal procedures pending response to present treatment and F/U evaluation We will avoid considering such treatment for this patient  Patient to call Pain Management Center should patient have concerns prior to scheduled return appointment.

## 2015-09-04 NOTE — Patient Instructions (Addendum)
PLAN   Continue present medication  F/U PCP Dr. Rosanna Randy for evaliation of  BP and general medical  condition  F/U surgical evaluation. May consider pending follow-up evaluations and response to treatment  F/U neurological evaluation. May consider pending follow-up evaluations  Physical therapy as discussed. Please exercise with caution to avoid aggravation of symptoms  May consider radiofrequency rhizolysis or intraspinal procedures pending response to present treatment and F/U evaluation . We will avoid considering such treatment at this time  Patient to call Pain Management Center should patient have concerns prior to scheduled return appointment.Pain Management Discharge Instructions  General Discharge Instructions :  If you need to reach your doctor call: Monday-Friday 8:00 am - 4:00 pm at [phone] or toll free [phone].  After clinic hours [phone] to have operator reach doctor.  Bring all of your medication bottles to all your appointments in the pain clinic.  To cancel or reschedule your appointment with Pain Management please remember to call 24 hours in advance to avoid a fee.  Refer to the educational materials which you have been given on: General Risks, I had my Procedure. Discharge Instructions, Post Sedation.  Post Procedure Instructions:  The drugs you were given will stay in your system until tomorrow, so for the next 24 hours you should not drive, make any legal decisions or drink any alcoholic beverages.  You may eat anything you prefer, but it is better to start with liquids then soups and crackers, and gradually work up to solid foods.  Please notify your doctor immediately if you have any unusual bleeding, trouble breathing or pain that is not related to your normal pain.  Depending on the type of procedure that was done, some parts of your body may feel week and/or numb.  This usually clears up by tonight or the next day.  Walk with the use of  an assistive device or accompanied by an adult for the 24 hours.  You may use ice on the affected area for the first 24 hours.  Put ice in a Ziploc bag and cover with a towel and place against area 15 minutes on 15 minutes off.  You may switch to heat after 24 hours.

## 2015-09-05 ENCOUNTER — Ambulatory Visit: Payer: Self-pay | Admitting: Internal Medicine

## 2015-09-05 NOTE — Telephone Encounter (Signed)
Please refer to dr Caryn Section

## 2015-09-05 NOTE — Telephone Encounter (Signed)
Denies any needs at this time pt states she is doing OK this morning.

## 2015-09-05 NOTE — Telephone Encounter (Signed)
She already has appointment set up with you? Do you still need [place] to call patient and change this-aa?

## 2015-09-08 ENCOUNTER — Ambulatory Visit: Payer: Self-pay | Admitting: Internal Medicine

## 2015-09-10 ENCOUNTER — Other Ambulatory Visit: Payer: Self-pay | Admitting: Family Medicine

## 2015-09-12 ENCOUNTER — Telehealth: Payer: Self-pay | Admitting: *Deleted

## 2015-09-12 ENCOUNTER — Other Ambulatory Visit: Payer: Self-pay

## 2015-09-12 DIAGNOSIS — R600 Localized edema: Secondary | ICD-10-CM

## 2015-09-12 DIAGNOSIS — E785 Hyperlipidemia, unspecified: Secondary | ICD-10-CM

## 2015-09-12 DIAGNOSIS — E039 Hypothyroidism, unspecified: Secondary | ICD-10-CM

## 2015-09-12 MED ORDER — SIMVASTATIN 20 MG PO TABS: 20.0000 mg | ORAL_TABLET | Freq: Every day | ORAL | 0 refills | Status: DC

## 2015-09-12 MED ORDER — FUROSEMIDE 40 MG PO TABS: 40.0000 mg | ORAL_TABLET | Freq: Every morning | ORAL | 0 refills | Status: DC

## 2015-09-12 MED ORDER — LEVOTHYROXINE SODIUM 50 MCG PO TABS: 50.0000 ug | ORAL_TABLET | Freq: Every day | ORAL | 0 refills | Status: DC

## 2015-09-14 ENCOUNTER — Other Ambulatory Visit: Payer: Self-pay | Admitting: Family Medicine

## 2015-09-14 DIAGNOSIS — D509 Iron deficiency anemia, unspecified: Secondary | ICD-10-CM

## 2015-09-14 NOTE — Telephone Encounter (Signed)
Pt contacted office for refill request on the following medications:  Ferrous Fumarate 324 (106 FE) MG TABS.  [employer].  [phone]

## 2015-09-14 NOTE — Telephone Encounter (Signed)
Please review-aa 

## 2015-09-14 NOTE — Telephone Encounter (Signed)
Okay to refill. Patient needs to make an appointment with Dr. Caryn Section or with one of the PAs.

## 2015-09-15 ENCOUNTER — Other Ambulatory Visit: Payer: Self-pay | Admitting: Family Medicine

## 2015-09-15 DIAGNOSIS — D509 Iron deficiency anemia, unspecified: Secondary | ICD-10-CM

## 2015-09-15 MED ORDER — FERROUS FUMARATE 324 (106 FE) MG PO TABS: 1.0000 | ORAL_TABLET | Freq: Every day | ORAL | 3 refills | Status: DC

## 2015-09-15 NOTE — Telephone Encounter (Signed)
Sandra Brown at [facility] advised. Dr Caryn Section to take over and daughter will call back and make that appointment. RX called in-aa

## 2015-09-15 NOTE — Telephone Encounter (Signed)
lmtcb at [facility] to advised them of this. RX not sent in yet want to make sure patient follows through with having appointment made before refilling medication-aa

## 2015-09-15 NOTE — Telephone Encounter (Signed)
[name] with [facility] is returning call.  [phone]

## 2015-09-15 NOTE — Telephone Encounter (Signed)
lmtcb-aa 

## 2015-09-18 ENCOUNTER — Ambulatory Visit: Payer: Self-pay | Admitting: Family Medicine

## 2015-09-19 ENCOUNTER — Encounter: Payer: Self-pay | Admitting: Psychiatry

## 2015-09-19 ENCOUNTER — Ambulatory Visit (INDEPENDENT_AMBULATORY_CARE_PROVIDER_SITE_OTHER): Payer: Medicare Other | Admitting: Psychiatry

## 2015-09-19 VITALS — BP 103/67 | HR 99 | Temp 97.6°F | Ht 60.0 in | Wt 122.2 lb

## 2015-09-19 DIAGNOSIS — F331 Major depressive disorder, recurrent, moderate: Secondary | ICD-10-CM

## 2015-09-19 MED ORDER — VENLAFAXINE HCL ER 75 MG PO CP24: 75.0000 mg | ORAL_CAPSULE | ORAL | 1 refills | Status: DC

## 2015-09-19 MED ORDER — VENLAFAXINE HCL ER 37.5 MG PO CP24: 37.5000 mg | ORAL_CAPSULE | Freq: Every day | ORAL | 2 refills | Status: DC

## 2015-09-19 NOTE — Progress Notes (Signed)
Patient ID: Sandra Brown, female   DOB: [birth year], 82 y.o.   MRN: [id number] [facility] MD/PA/NP OP Progress Note  09/19/2015 4:10 PM Sandra Brown  MRN:  [id number]   Subjective:  Patient returns a follow-up of her major depressive disorder. Patient came to the appointment today with her daughter . She reports tolerating the Effexor at 112.5mg  daily, states her mood is better, has more energy . Not lying in bed as much. She lives in an assisted living facility and states that they take good care of her.Reports feeling better overall, concerned about her picking her skin. . Denies any suicidal thoughts.    Chief Complaint: Improved mood   Visit Diagnosis:     ICD-9-CM ICD-10-CM   1. Major depressive disorder, recurrent episode, moderate (HCC) 296.32 F33.1     Past Medical History:  Past Medical History:  Diagnosis Date  . Cataract   . COPD (chronic obstructive pulmonary disease) ([place])   . Depression   . Difficulty swallowing   . Frequent headaches   . Hearing loss   . Hypertension   . Hypothyroidism   . Reflux     Past Surgical History:  Procedure Laterality Date  . ABDOMINAL HYSTERECTOMY    . APPENDECTOMY    . LUMBAR LAMINECTOMY    . PARATHYROIDECTOMY    . TOTAL HIP ARTHROPLASTY     x 4   Family History:  Family History  Problem Relation Age of Onset  . Stroke Mother   . Hypertension Mother   . Heart disease Father   . Hypertension Father    Social History:  Social History   Social History  . Marital status: Widowed    Spouse name: N/A  . Number of children: N/A  . Years of education: N/A   Social History Main Topics  . Smoking status: Never Smoker  . Smokeless tobacco: Never Used     Comment: quit 1954  . Alcohol use No  . Drug use: No  . Sexual activity: No   Other Topics Concern  . Not on file   Social History Narrative  . No narrative on file   Additional History:   Assessment:   Musculoskeletal: Strength & Muscle Tone: She ambulates  slowly but without any assistance Gait & Station: normal Patient leans: N/A  Psychiatric Specialty Exam: HPI  Review of Systems  Psychiatric/Behavioral: Negative for depression, hallucinations, memory loss, substance abuse and suicidal ideas. The patient is not nervous/anxious and does not have insomnia.   All other systems reviewed and are negative.   There were no vitals taken for this visit.There is no height or weight on file to calculate BMI.  General Appearance: Neat and Well Groomed  Eye Contact:  Good  Speech:  Normal Rate  Volume:  Normal  Mood:  improved  Affect:  smiling  Thought Process:  Linear and Logical  Orientation:  Full (Time, Place, and Person)  Thought Content:  Negative  Suicidal Thoughts:  No  Homicidal Thoughts:  No  Memory:  Immediate;   Good Recent;   Good Remote;   Good  Judgement:  Good  Insight:  Good  Psychomotor Activity:  Negative  Concentration:  Good  Recall:  Good  Fund of Knowledge: Good  Language: Good  Akathisia:  Negative  Handed:  Right unknown   AIMS (if indicated):  N/A  Assets:  Communication Skills Desire for Improvement Social Support  ADL's:  Intact  Cognition: WNL  Sleep:  better   Is  the patient at risk to self?  No. Has the patient been a risk to self in the past 6 months?  No. Has the patient been a risk to self within the distant past?  No. Is the patient a risk to others?  No. Has the patient been a risk to others in the past 6 months?  No. Has the patient been a risk to others within the distant past?  No.  Current Medications: Current Outpatient Prescriptions  Medication Sig Dispense Refill  . acetaminophen (TYLENOL) 500 MG tablet Take 1 tablet (500 mg total) by mouth every 6 (six) hours as needed for moderate pain. Also give 500 mg three times a day for 7  Days. 60 tablet 0  . ALPRAZolam (XANAX) 0.25 MG tablet Take 1 tablet (0.25 mg total) by mouth daily as needed for anxiety. 30 tablet 3  . AMBULATORY NON  FORMULARY MEDICATION Medication Name: incentive spirometry Use as directed (Patient not taking: Reported on 09/04/2015) 1 each 0  . CALCIUM PO Take by mouth.    . celecoxib (CELEBREX) 200 MG capsule Take 1 capsule (200 mg total) by mouth daily. 90 capsule 1  . [facility] RANITIDINE 75 MG tablet Take 75 mg by mouth 2 (two) times daily as needed.   3  . dexlansoprazole (DEXILANT) 60 MG capsule Take 60 mg by mouth daily.    Marland Kitchen. dextromethorphan (DELSYM) 30 MG/5ML liquid Take by mouth 2 (two) times daily as needed for cough.    . esomeprazole (NEXIUM) 40 MG capsule Take 40 mg by mouth.    . Ferrous Fumarate (HEMOCYTE - 106 MG FE) 324 (106 Fe) MG TABS tablet Take 1 tablet (106 mg of iron total) by mouth daily. 90 tablet 3  . fluticasone (FLONASE) 50 MCG/ACT nasal spray USE 2 SPRAYS NASALLY DAILY 48 g 2  . furosemide (LASIX) 40 MG tablet Take 1 tablet (40 mg total) by mouth every morning. 90 tablet 0  . guaiFENesin (MUCINEX) 600 MG 12 hr tablet Take 400 mg by mouth 2 (two) times daily.     Marland Kitchen. levocetirizine (XYZAL) 5 MG tablet TAKE 1 TABLET AT BEDTIME EVERY NIGHT 90 tablet 1  . levothyroxine (SYNTHROID, LEVOTHROID) 50 MCG tablet Take 1 tablet (50 mcg total) by mouth daily. 90 tablet 0  . lidocaine (LIDODERM) 5 % Place 1 patch onto the skin daily. Remove & Discard patch within 12 hours or as directed by MD    . loperamide (IMODIUM) 2 MG capsule Take 2 mg by mouth as needed for diarrhea or loose stools.    . magnesium gluconate (MAGONATE) 500 MG tablet Take 500 mg by mouth daily.    . meclizine (ANTIVERT) 12.5 MG tablet Take 12.5 mg by mouth every 4 (four) hours as needed.     Marland Kitchen. MELATONIN PO Take by mouth daily.    . montelukast (SINGULAIR) 10 MG tablet TAKE 1 TABLET DAILY 90 tablet 2  . Multiple Vitamin (MULTIVITAMIN) tablet Take 1 tablet by mouth daily.    . OSCIMIN 0.125 MG tablet TAKE 1 TABLET TWICE A DAY 180 tablet 0  . potassium chloride (MICRO-K) 10 MEQ CR capsule TAKE 2 CAPSULES DAILY (Patient not taking:  Reported on 09/04/2015) 180 capsule 1  . Probiotic Product (PROBIOTIC COLON SUPPORT PO) Take 1 capsule by mouth daily.    . simvastatin (ZOCOR) 20 MG tablet Take 1 tablet (20 mg total) by mouth daily. 90 tablet 0  . SPIRIVA HANDIHALER 18 MCG inhalation capsule INHALE THE CONTENTS OF  1 CAPSULE DAILY 90 capsule 1  . spironolactone (ALDACTONE) 25 MG tablet TAKE 1 TABLET BY MOUTH EVERY DAY 90 tablet 1  . tamsulosin (FLOMAX) 0.4 MG CAPS capsule TAKE 1 CAPSULE DAILY 90 capsule 1  . venlafaxine XR (EFFEXOR XR) 37.5 MG 24 hr capsule Take 1 capsule (37.5 mg total) by mouth daily. 90 capsule 2  . venlafaxine XR (EFFEXOR-XR) 75 MG 24 hr capsule Take 1 capsule (75 mg total) by mouth every morning. 90 capsule 1  . Wheat Dextrin (BENEFIBER DRINK MIX PO) Take 15 mLs by mouth daily.     Current Facility-Administered Medications  Medication Dose Route Frequency Provider Last Rate Last Dose  . orphenadrine (NORFLEX) injection 60 mg  60 mg Intramuscular Once Mohammed Kindle, MD        Medical Decision Making:  Established Problem, Stable/Improving (1) and Review of New Medication or Change in Dosage (2)  Treatment Plan Summary:Medication management and Plan   Major depressive disorder, recurrent, moderate.  Continue Effexor at 112.5 mg,  Continue  alprazolam .25 mg QD prn anxiety. She takes it about 2-3 times a month. List of therapists to address skin picking behaviors.  Patient will follow up in 4 months.  She has been encouraged to call the clinic with any questions or concerns.  Amandamarie Feggins 09/19/2015, 4:10 PM

## 2015-09-20 ENCOUNTER — Ambulatory Visit: Payer: Medicare Other | Admitting: Podiatry

## 2015-09-21 DIAGNOSIS — K219 Gastro-esophageal reflux disease without esophagitis: Secondary | ICD-10-CM | POA: Diagnosis not present

## 2015-09-21 DIAGNOSIS — R197 Diarrhea, unspecified: Secondary | ICD-10-CM | POA: Diagnosis not present

## 2015-09-22 ENCOUNTER — Ambulatory Visit: Payer: Self-pay | Admitting: Family Medicine

## 2015-09-23 ENCOUNTER — Other Ambulatory Visit: Payer: Self-pay | Admitting: Family Medicine

## 2015-09-25 ENCOUNTER — Ambulatory Visit: Payer: Self-pay | Admitting: Pain Medicine

## 2015-09-30 ENCOUNTER — Other Ambulatory Visit: Payer: Self-pay | Admitting: Family Medicine

## 2015-09-30 DIAGNOSIS — J309 Allergic rhinitis, unspecified: Secondary | ICD-10-CM

## 2015-10-02 ENCOUNTER — Ambulatory Visit (INDEPENDENT_AMBULATORY_CARE_PROVIDER_SITE_OTHER): Payer: Medicare Other | Admitting: Family Medicine

## 2015-10-02 ENCOUNTER — Encounter: Payer: Self-pay | Admitting: Family Medicine

## 2015-10-02 VITALS — BP 98/46 | HR 80 | Temp 98.1°F | Resp 16 | Wt 122.6 lb

## 2015-10-02 DIAGNOSIS — E890 Postprocedural hypothyroidism: Secondary | ICD-10-CM

## 2015-10-02 DIAGNOSIS — N183 Chronic kidney disease, stage 3 unspecified: Secondary | ICD-10-CM | POA: Insufficient documentation

## 2015-10-02 DIAGNOSIS — E039 Hypothyroidism, unspecified: Secondary | ICD-10-CM | POA: Insufficient documentation

## 2015-10-02 DIAGNOSIS — D509 Iron deficiency anemia, unspecified: Secondary | ICD-10-CM

## 2015-10-02 DIAGNOSIS — M5136 Other intervertebral disc degeneration, lumbar region: Secondary | ICD-10-CM

## 2015-10-02 MED ORDER — CELECOXIB 200 MG PO CAPS: 200.0000 mg | ORAL_CAPSULE | Freq: Every day | ORAL | 1 refills | Status: DC

## 2015-10-02 NOTE — Progress Notes (Signed)
Subjective:     Patient ID: [name], female   DOB: 1926/03/29, 82 y.o.   MRN: [id number]  HPI  Chief Complaint  Patient presents with  . Medication Refill    Patient comes into office today requesting refill on Celecoxib 200mg . She states that she has no furthere questions or concerns today and will be recieveing her flu shot at [facility].   Accompanied by her daughter today. Continues to be followed by [facility] pain clinic; [facility] psychiatry, Dr. Einar Grad; [facility], Dr. Amalia Hailey; and pulmonary, Dr. Stevenson Clinch. Continues to live in assisted living at [facility].   Review of Systems     Objective:   Physical Exam  Constitutional: She appears well-developed and well-nourished. No distress.  HENT:  Moderate hard of hearing  Cardiovascular: Normal rate and regular rhythm.   Pulmonary/Chest: Breath sounds normal.  Musculoskeletal: She exhibits no edema (of lower extremities).       Assessment:    1. DDD (degenerative disc disease), lumbar - celecoxib (CELEBREX) 200 MG capsule; Take 1 capsule (200 mg total) by mouth daily.  Dispense: 90 capsule; Refill: 1  2. Iron deficiency anemia - CBC with Differential/Platelet - Ferritin  3. CKD (chronic kidney disease) stage 3, GFR 30-59 ml/min - Comprehensive metabolic panel  4. Postoperative hypothyroidism - T4, free - TSH    Plan:    Further f/u pending lab work. Patient may get labs at [facility] and orders written.

## 2015-10-02 NOTE — Patient Instructions (Signed)
We will review the lab results when available and call you about them.

## 2015-10-05 ENCOUNTER — Telehealth: Payer: Self-pay | Admitting: Family Medicine

## 2015-10-05 ENCOUNTER — Other Ambulatory Visit: Payer: Self-pay | Admitting: Family Medicine

## 2015-10-05 DIAGNOSIS — R600 Localized edema: Secondary | ICD-10-CM

## 2015-10-05 DIAGNOSIS — N189 Chronic kidney disease, unspecified: Secondary | ICD-10-CM | POA: Diagnosis not present

## 2015-10-05 DIAGNOSIS — D509 Iron deficiency anemia, unspecified: Secondary | ICD-10-CM | POA: Diagnosis not present

## 2015-10-05 DIAGNOSIS — E039 Hypothyroidism, unspecified: Secondary | ICD-10-CM | POA: Diagnosis not present

## 2015-10-05 MED ORDER — SPIRONOLACTONE 25 MG PO TABS: 25.0000 mg | ORAL_TABLET | Freq: Every day | ORAL | 1 refills | Status: DC

## 2015-10-05 NOTE — Telephone Encounter (Signed)
Please review. Thanks!  

## 2015-10-05 NOTE — Telephone Encounter (Signed)
Done

## 2015-10-05 NOTE — Telephone Encounter (Signed)
Please send to Bob--I think she established with him this week.

## 2015-10-05 NOTE — Telephone Encounter (Signed)
Pt contacted office for refill request on the following medications:  spironolactone (ALDACTONE) 25 MG tablet.  [employer].  [phone]

## 2015-10-06 ENCOUNTER — Encounter: Payer: Self-pay | Admitting: Family Medicine

## 2015-10-06 ENCOUNTER — Ambulatory Visit (INDEPENDENT_AMBULATORY_CARE_PROVIDER_SITE_OTHER): Payer: Medicare Other | Admitting: Podiatry

## 2015-10-06 ENCOUNTER — Encounter: Payer: Self-pay | Admitting: Podiatry

## 2015-10-06 ENCOUNTER — Other Ambulatory Visit: Payer: Self-pay | Admitting: Family Medicine

## 2015-10-06 VITALS — BP 108/63 | HR 84 | Resp 18

## 2015-10-06 DIAGNOSIS — L608 Other nail disorders: Secondary | ICD-10-CM

## 2015-10-06 DIAGNOSIS — L603 Nail dystrophy: Secondary | ICD-10-CM

## 2015-10-06 DIAGNOSIS — B351 Tinea unguium: Secondary | ICD-10-CM | POA: Diagnosis not present

## 2015-10-06 DIAGNOSIS — L609 Nail disorder, unspecified: Secondary | ICD-10-CM

## 2015-10-06 DIAGNOSIS — M79676 Pain in unspecified toe(s): Secondary | ICD-10-CM | POA: Diagnosis not present

## 2015-10-06 DIAGNOSIS — M79609 Pain in unspecified limb: Principal | ICD-10-CM

## 2015-10-07 NOTE — Progress Notes (Signed)
SUBJECTIVE Patient  presents to office today complaining of elongated, thickened nails. Pain while ambulating in shoes. Patient is unable to trim their own nails.   OBJECTIVE General Patient is awake, alert, and oriented x 3 and in no acute distress. Derm Skin is dry and supple bilateral. Negative open lesions or macerations. Remaining integument unremarkable. Nails are tender, long, thickened and dystrophic with subungual debris, consistent with onychomycosis, 1-5 bilateral. No signs of infection noted. Vasc  DP and PT pedal pulses palpable bilaterally. Temperature gradient within normal limits.  Neuro Epicritic and protective threshold sensation diminished bilaterally.  Musculoskeletal Exam No symptomatic pedal deformities noted bilateral. Muscular strength within normal limits.  ASSESSMENT 1. Onychodystrophic nails 1-5 bilateral with hyperkeratosis of nails.  2. Onychomycosis of nail due to dermatophyte bilateral 3. Pain in foot bilateral  PLAN OF CARE 1. Patient evaluated today.  2. Instructed to maintain good pedal hygiene and foot care.  3. Mechanical debridement of nails 1-5 bilaterally performed using a nail nipper. Filed with dremel without incident.  4. Return to clinic in 3 mos.    Sincerity Cedar M Jakevion Arney, DPM    

## 2015-10-08 ENCOUNTER — Other Ambulatory Visit: Payer: Self-pay | Admitting: Family Medicine

## 2015-11-08 ENCOUNTER — Ambulatory Visit (INDEPENDENT_AMBULATORY_CARE_PROVIDER_SITE_OTHER): Payer: Medicare Other | Admitting: Family Medicine

## 2015-11-08 DIAGNOSIS — Z23 Encounter for immunization: Secondary | ICD-10-CM

## 2015-12-07 ENCOUNTER — Other Ambulatory Visit: Payer: Self-pay | Admitting: Psychiatry

## 2015-12-13 ENCOUNTER — Other Ambulatory Visit: Payer: Self-pay | Admitting: Family Medicine

## 2015-12-13 DIAGNOSIS — E785 Hyperlipidemia, unspecified: Secondary | ICD-10-CM

## 2015-12-13 DIAGNOSIS — E039 Hypothyroidism, unspecified: Secondary | ICD-10-CM

## 2015-12-13 DIAGNOSIS — R600 Localized edema: Secondary | ICD-10-CM

## 2015-12-15 ENCOUNTER — Other Ambulatory Visit: Payer: Self-pay | Admitting: Family Medicine

## 2015-12-15 DIAGNOSIS — E890 Postprocedural hypothyroidism: Secondary | ICD-10-CM

## 2015-12-15 DIAGNOSIS — R600 Localized edema: Secondary | ICD-10-CM

## 2015-12-15 DIAGNOSIS — E782 Mixed hyperlipidemia: Secondary | ICD-10-CM

## 2015-12-15 DIAGNOSIS — K589 Irritable bowel syndrome without diarrhea: Secondary | ICD-10-CM

## 2015-12-15 MED ORDER — FUROSEMIDE 40 MG PO TABS: 40.0000 mg | ORAL_TABLET | Freq: Every morning | ORAL | 3 refills | Status: DC

## 2015-12-15 MED ORDER — SIMVASTATIN 20 MG PO TABS: 20.0000 mg | ORAL_TABLET | Freq: Every day | ORAL | 3 refills | Status: DC

## 2015-12-15 MED ORDER — HYOSCYAMINE SULFATE 0.125 MG PO TABS: ORAL_TABLET | ORAL | 3 refills | Status: DC

## 2015-12-15 MED ORDER — LEVOTHYROXINE SODIUM 50 MCG PO TABS: 50.0000 ug | ORAL_TABLET | Freq: Every day | ORAL | 3 refills | Status: DC

## 2015-12-25 ENCOUNTER — Other Ambulatory Visit: Payer: Self-pay | Admitting: Psychiatry

## 2016-01-05 ENCOUNTER — Ambulatory Visit (INDEPENDENT_AMBULATORY_CARE_PROVIDER_SITE_OTHER): Payer: Medicare Other | Admitting: Podiatry

## 2016-01-05 ENCOUNTER — Encounter: Payer: Self-pay | Admitting: Podiatry

## 2016-01-05 DIAGNOSIS — M79676 Pain in unspecified toe(s): Secondary | ICD-10-CM | POA: Diagnosis not present

## 2016-01-05 DIAGNOSIS — L608 Other nail disorders: Secondary | ICD-10-CM

## 2016-01-05 DIAGNOSIS — L603 Nail dystrophy: Secondary | ICD-10-CM

## 2016-01-05 DIAGNOSIS — B351 Tinea unguium: Secondary | ICD-10-CM | POA: Diagnosis not present

## 2016-01-05 NOTE — Progress Notes (Signed)
   SUBJECTIVE Patient  presents to office today complaining of elongated, thickened nails. Pain while ambulating in shoes. Patient is unable to trim their own nails.   OBJECTIVE General Patient is awake, alert, and oriented x 3 and in no acute distress. Derm Skin is dry and supple bilateral. Negative open lesions or macerations. Remaining integument unremarkable. Nails are tender, long, thickened and dystrophic with subungual debris, consistent with onychomycosis, 1-5 bilateral. No signs of infection noted. Vasc  DP and PT pedal pulses palpable bilaterally. Temperature gradient within normal limits.  Neuro Epicritic and protective threshold sensation diminished bilaterally.  Musculoskeletal Exam No symptomatic pedal deformities noted bilateral. Muscular strength within normal limits.  ASSESSMENT 1. Onychodystrophic nails 1-5 bilateral with hyperkeratosis of nails.  2. Onychomycosis of nail due to dermatophyte bilateral 3. Pain in foot bilateral  PLAN OF CARE 1. Patient evaluated today.  2. Instructed to maintain good pedal hygiene and foot care.  3. Mechanical debridement of nails 1-5 bilaterally performed using a nail nipper. Filed with dremel without incident.  4. Return to clinic in 3 mos.    Klaire Court M. Rachel Rison, DPM [facility]  Dr. Keeven Matty M. Maor Meckel, DPM    [address]                                        [place], [place] [address]                Office [phone]  Fax [phone]      

## 2016-01-18 ENCOUNTER — Ambulatory Visit: Payer: Medicare Other | Admitting: Psychiatry

## 2016-02-01 ENCOUNTER — Other Ambulatory Visit: Payer: Self-pay | Admitting: Family Medicine

## 2016-02-05 NOTE — Progress Notes (Signed)
Vaccine only

## 2016-02-12 ENCOUNTER — Telehealth: Payer: Self-pay

## 2016-02-12 NOTE — Telephone Encounter (Signed)
I will not be able to prescribe Xanax for this patient given her age and the risk of falls and sedation

## 2016-02-12 NOTE — Telephone Encounter (Signed)
they were wondering if patient can have xanax.  dr. Jimmye Norman had patient on it but the rx has expired awhile back.  pt does not take all the time.  just on  bad days. Pt does have an appt for Thursday at 4:00

## 2016-02-15 ENCOUNTER — Ambulatory Visit: Payer: Medicare Other | Admitting: Psychiatry

## 2016-02-29 ENCOUNTER — Encounter: Payer: Self-pay | Admitting: Psychiatry

## 2016-02-29 ENCOUNTER — Ambulatory Visit (INDEPENDENT_AMBULATORY_CARE_PROVIDER_SITE_OTHER): Payer: Medicare Other | Admitting: Psychiatry

## 2016-02-29 VITALS — BP 121/71 | HR 101 | Temp 97.5°F | Wt 126.4 lb

## 2016-02-29 DIAGNOSIS — F331 Major depressive disorder, recurrent, moderate: Secondary | ICD-10-CM | POA: Diagnosis not present

## 2016-02-29 DIAGNOSIS — F321 Major depressive disorder, single episode, moderate: Secondary | ICD-10-CM

## 2016-02-29 MED ORDER — VENLAFAXINE HCL ER 150 MG PO CP24: 150.0000 mg | ORAL_CAPSULE | Freq: Every day | ORAL | 2 refills | Status: DC

## 2016-02-29 MED ORDER — ALPRAZOLAM 0.25 MG PO TABS: ORAL_TABLET | ORAL | 0 refills | Status: DC

## 2016-02-29 NOTE — Progress Notes (Signed)
Patient ID: Sandra Brown, female   DOB: February 14, 1926, 83 y.o.   MRN: [id number] [facility] MD/PA/NP OP Progress Note  02/29/2016 2:56 PM Sandra Brown  MRN:  [id number]   Subjective:  Patient returns a follow-up of her major depressive disorder. Patient reports that overall she seems to be doing okay but states that she gets into a depressive form about once a twice every 2 weeks states that it lasts for about 2 days when she has to work herself out of it. She states that she is doing okay with her relationship with her daughters. Sleeping and eating okay. Denies any suicidal thoughts. Patient will also likely refill on her Xanax and states that medication is given to her and she does not take it. States that she needs it about 2-3 times a month.  Chief Complaint: Depressed episodically  Chief Complaint    Follow-up; Medication Refill     Visit Diagnosis:     ICD-9-CM ICD-10-CM   1. Major depressive disorder, recurrent episode, moderate (HCC) 296.32 F33.1     Past Medical History:  Past Medical History:  Diagnosis Date  . Cataract   . COPD (chronic obstructive pulmonary disease) ([place])   . Depression   . Difficulty swallowing   . Frequent headaches   . Hearing loss   . Hypertension   . Hypothyroidism   . Reflux     Past Surgical History:  Procedure Laterality Date  . ABDOMINAL HYSTERECTOMY    . APPENDECTOMY    . LUMBAR LAMINECTOMY    . PARATHYROIDECTOMY    . TOTAL HIP ARTHROPLASTY     x 4   Family History:  Family History  Problem Relation Age of Onset  . Stroke Mother   . Hypertension Mother   . Heart disease Father   . Hypertension Father    Social History:  Social History   Social History  . Marital status: Widowed    Spouse name: N/A  . Number of children: N/A  . Years of education: N/A   Social History Main Topics  . Smoking status: Never Smoker  . Smokeless tobacco: Never Used     Comment: quit 1954  . Alcohol use No  . Drug use: No  . Sexual  activity: No   Other Topics Concern  . None   Social History Narrative  . None   Additional History:   Assessment:   Musculoskeletal: Strength & Muscle Tone: She ambulates slowly but without any assistance Gait & Station: normal Patient leans: N/A  Psychiatric Specialty Exam: HPI  Review of Systems  Psychiatric/Behavioral: Negative for depression, hallucinations, memory loss, substance abuse and suicidal ideas. The patient is not nervous/anxious and does not have insomnia.   All other systems reviewed and are negative.   Blood pressure 121/71, pulse (!) 101, temperature 97.5 F (36.4 C), temperature source Oral, weight 126 lb 6.4 oz (57.3 kg).Body mass index is 24.69 kg/m.  General Appearance: Neat and Well Groomed  Eye Contact:  Good  Speech:  Normal Rate  Volume:  Normal  Mood:  Occasional depression  Affect:  smiling  Thought Process:  Linear and Logical  Orientation:  Full (Time, Place, and Person)  Thought Content:  Negative  Suicidal Thoughts:  No  Homicidal Thoughts:  No  Memory:  Immediate;   Good Recent;   Good Remote;   Good  Judgement:  Good  Insight:  Good  Psychomotor Activity:  Negative  Concentration:  Good  Recall:  Good  Fund  of Knowledge: Good  Language: Good  Akathisia:  Negative  Handed:  Right unknown   AIMS (if indicated):  N/A  Assets:  Communication Skills Desire for Improvement Social Support  ADL's:  Intact  Cognition: WNL  Sleep:  better   Is the patient at risk to self?  No. Has the patient been a risk to self in the past 6 months?  No. Has the patient been a risk to self within the distant past?  No. Is the patient a risk to others?  No. Has the patient been a risk to others in the past 6 months?  No. Has the patient been a risk to others within the distant past?  No.  Current Medications: Current Outpatient Prescriptions  Medication Sig Dispense Refill  . acetaminophen (TYLENOL) 500 MG tablet Take 1 tablet (500 mg total)  by mouth every 6 (six) hours as needed for moderate pain. Also give 500 mg three times a day for 7  Days. 60 tablet 0  . ALPRAZolam (XANAX) 0.25 MG tablet Take 1 tablet (0.25 mg total) by mouth daily as needed for anxiety. 30 tablet 3  . AMBULATORY NON FORMULARY MEDICATION Medication Name: incentive spirometry Use as directed 1 each 0  . CALCIUM PO Take by mouth.    . celecoxib (CELEBREX) 200 MG capsule Take 1 capsule (200 mg total) by mouth daily. 90 capsule 1  . [facility] RANITIDINE 75 MG tablet Take 75 mg by mouth 2 (two) times daily as needed.   3  . dexlansoprazole (DEXILANT) 60 MG capsule Take 60 mg by mouth daily.    Marland Kitchen dextromethorphan (DELSYM) 30 MG/5ML liquid Take by mouth 2 (two) times daily as needed for cough.    . fluticasone (FLONASE) 50 MCG/ACT nasal spray USE 2 SPRAYS NASALLY DAILY 48 g 2  . furosemide (LASIX) 40 MG tablet Take 1 tablet (40 mg total) by mouth every morning. 90 tablet 3  . guaiFENesin (MUCINEX) 600 MG 12 hr tablet Take 400 mg by mouth 2 (two) times daily.     . hyoscyamine (OSCIMIN) 0.125 MG tablet One pill twice daily 180 tablet 3  . levocetirizine (XYZAL) 5 MG tablet TAKE 1 TABLET AT BEDTIME EVERY NIGHT 90 tablet 1  . levothyroxine (SYNTHROID, LEVOTHROID) 50 MCG tablet Take 1 tablet (50 mcg total) by mouth daily. 90 tablet 3  . lidocaine (LIDODERM) 5 % Place 1 patch onto the skin daily. Remove & Discard patch within 12 hours or as directed by MD    . loperamide (IMODIUM) 2 MG capsule Take 2 mg by mouth as needed for diarrhea or loose stools.    . magnesium gluconate (MAGONATE) 500 MG tablet Take 500 mg by mouth daily.    . meclizine (ANTIVERT) 12.5 MG tablet Take 12.5 mg by mouth every 4 (four) hours as needed.     . montelukast (SINGULAIR) 10 MG tablet TAKE 1 TABLET DAILY 90 tablet 3  . Multiple Vitamin (MULTIVITAMIN) tablet Take 1 tablet by mouth daily.    . potassium chloride (MICRO-K) 10 MEQ CR capsule TAKE 2 CAPSULES DAILY 180 capsule 1  . Probiotic Product  (PROBIOTIC COLON SUPPORT PO) Take 1 capsule by mouth daily.    . simvastatin (ZOCOR) 20 MG tablet Take 1 tablet (20 mg total) by mouth daily. 90 tablet 3  . SPIRIVA HANDIHALER 18 MCG inhalation capsule INHALE THE CONTENTS OF 1 CAPSULE DAILY 90 capsule 3  . spironolactone (ALDACTONE) 25 MG tablet Take 1 tablet (25 mg total) by  mouth daily. 90 tablet 1  . spironolactone (ALDACTONE) 25 MG tablet TAKE 1 TABLET BY MOUTH EVERY DAY 90 tablet 1  . tamsulosin (FLOMAX) 0.4 MG CAPS capsule TAKE 1 CAPSULE DAILY 90 capsule 0  . venlafaxine XR (EFFEXOR XR) 37.5 MG 24 hr capsule Take 1 capsule (37.5 mg total) by mouth daily. 90 capsule 2  . venlafaxine XR (EFFEXOR-XR) 75 MG 24 hr capsule Take 1 capsule (75 mg total) by mouth every morning. 90 capsule 1  . Wheat Dextrin (BENEFIBER DRINK MIX PO) Take 15 mLs by mouth daily.     Current Facility-Administered Medications  Medication Dose Route Frequency Provider Last Rate Last Dose  . orphenadrine (NORFLEX) injection 60 mg  60 mg Intramuscular Once Mohammed Kindle, MD        Medical Decision Making:  Established Problem, Stable/Improving (1) and Review of New Medication or Change in Dosage (2)  Treatment Plan Summary:Medication management and Plan   Major depressive disorder, recurrent, moderate.  Increase effexor to 150mg  po qd. Continue  alprazolam .25 mg QD prn anxiety. She takes it about 2-3 times a month.  Patient will follow up in 1 months.  She has been encouraged to call the clinic with any questions or concerns.  Thalya Fouche 02/29/2016, 2:56 PM

## 2016-03-21 ENCOUNTER — Telehealth: Payer: Self-pay | Admitting: Family Medicine

## 2016-03-21 ENCOUNTER — Other Ambulatory Visit: Payer: Self-pay | Admitting: Family Medicine

## 2016-03-21 MED ORDER — LIDOCAINE 5 % EX PTCH: 1.0000 | MEDICATED_PATCH | CUTANEOUS | 5 refills | Status: DC

## 2016-03-21 NOTE — Telephone Encounter (Signed)
Pt called wanting to know if you would prescribe her lidocaine patches for her that she use to get from Dr. Primus Bravo.  She said he has moved to [facility] and she does not want to go there to see him.  She uses [facility] [address].  Sandra Brown

## 2016-03-21 NOTE — Telephone Encounter (Signed)
Patient advised.

## 2016-03-21 NOTE — Telephone Encounter (Signed)
Lidocaine patches sent in

## 2016-03-25 ENCOUNTER — Other Ambulatory Visit: Payer: Self-pay | Admitting: Family Medicine

## 2016-03-25 DIAGNOSIS — R600 Localized edema: Secondary | ICD-10-CM

## 2016-03-29 ENCOUNTER — Ambulatory Visit: Payer: Medicare Other | Admitting: Psychiatry

## 2016-03-29 DIAGNOSIS — S0080XA Unspecified superficial injury of other part of head, initial encounter: Secondary | ICD-10-CM | POA: Diagnosis not present

## 2016-03-29 DIAGNOSIS — L281 Prurigo nodularis: Secondary | ICD-10-CM | POA: Diagnosis not present

## 2016-03-29 DIAGNOSIS — L821 Other seborrheic keratosis: Secondary | ICD-10-CM | POA: Diagnosis not present

## 2016-03-31 ENCOUNTER — Other Ambulatory Visit: Payer: Self-pay | Admitting: Family Medicine

## 2016-03-31 DIAGNOSIS — J309 Allergic rhinitis, unspecified: Secondary | ICD-10-CM

## 2016-04-01 NOTE — Telephone Encounter (Signed)
This is a former patient of Dr. Sharyon Medicus.  She has not seen anyone other than [name] since Dr Venia Minks left.  You have only refilled medication for her.  Does she need to see someone as a primary or do you want to refill the meds. Thanks ED

## 2016-04-02 NOTE — Telephone Encounter (Signed)
She was assigned to Carmon Ginsberg, [place], -aa

## 2016-04-02 NOTE — Telephone Encounter (Signed)
Yes,someone.

## 2016-04-14 ENCOUNTER — Other Ambulatory Visit: Payer: Self-pay | Admitting: Family Medicine

## 2016-04-14 DIAGNOSIS — M5136 Other intervertebral disc degeneration, lumbar region: Secondary | ICD-10-CM

## 2016-04-19 ENCOUNTER — Ambulatory Visit (INDEPENDENT_AMBULATORY_CARE_PROVIDER_SITE_OTHER): Payer: Medicare Other | Admitting: Podiatry

## 2016-04-19 ENCOUNTER — Encounter: Payer: Self-pay | Admitting: Podiatry

## 2016-04-19 DIAGNOSIS — B351 Tinea unguium: Secondary | ICD-10-CM

## 2016-04-19 DIAGNOSIS — L603 Nail dystrophy: Secondary | ICD-10-CM

## 2016-04-19 DIAGNOSIS — M79609 Pain in unspecified limb: Secondary | ICD-10-CM

## 2016-04-19 DIAGNOSIS — L608 Other nail disorders: Secondary | ICD-10-CM

## 2016-04-22 NOTE — Progress Notes (Signed)
   SUBJECTIVE Patient  presents to office today complaining of elongated, thickened nails. Pain while ambulating in shoes. Patient is unable to trim their own nails.   OBJECTIVE General Patient is awake, alert, and oriented x 3 and in no acute distress. Derm Skin is dry and supple bilateral. Negative open lesions or macerations. Remaining integument unremarkable. Nails are tender, long, thickened and dystrophic with subungual debris, consistent with onychomycosis, 1-5 bilateral. No signs of infection noted. Vasc  DP and PT pedal pulses palpable bilaterally. Temperature gradient within normal limits.  Neuro Epicritic and protective threshold sensation diminished bilaterally.  Musculoskeletal Exam No symptomatic pedal deformities noted bilateral. Muscular strength within normal limits.  ASSESSMENT 1. Onychodystrophic nails 1-5 bilateral with hyperkeratosis of nails.  2. Onychomycosis of nail due to dermatophyte bilateral 3. Pain in foot bilateral  PLAN OF CARE 1. Patient evaluated today.  2. Instructed to maintain good pedal hygiene and foot care.  3. Mechanical debridement of nails 1-5 bilaterally performed using a nail nipper. Filed with dremel without incident.  4. Return to clinic in 3 mos.    Wren Pryce M. Nyilah Kight, DPM [facility]  Dr. Marvel Mcphillips M. Nickalous Stingley, DPM    [address]                                        [place], [place] [address]                Office [phone]  Fax [phone]      

## 2016-05-15 ENCOUNTER — Other Ambulatory Visit: Payer: Self-pay | Admitting: Family Medicine

## 2016-05-15 NOTE — Telephone Encounter (Signed)
Please re view for Carmon Ginsberg, he is out of the office-aa

## 2016-05-31 ENCOUNTER — Other Ambulatory Visit: Payer: Self-pay | Admitting: Family Medicine

## 2016-05-31 DIAGNOSIS — R600 Localized edema: Secondary | ICD-10-CM

## 2016-07-19 ENCOUNTER — Ambulatory Visit (INDEPENDENT_AMBULATORY_CARE_PROVIDER_SITE_OTHER): Payer: Medicare Other | Admitting: Podiatry

## 2016-07-19 DIAGNOSIS — B351 Tinea unguium: Secondary | ICD-10-CM

## 2016-07-19 DIAGNOSIS — M79676 Pain in unspecified toe(s): Secondary | ICD-10-CM | POA: Diagnosis not present

## 2016-07-28 NOTE — Progress Notes (Signed)
   SUBJECTIVE Patient  presents to office today complaining of elongated, thickened nails. Pain while ambulating in shoes. Patient is unable to trim their own nails.   OBJECTIVE General Patient is awake, alert, and oriented x 3 and in no acute distress. Derm Skin is dry and supple bilateral. Negative open lesions or macerations. Remaining integument unremarkable. Nails are tender, long, thickened and dystrophic with subungual debris, consistent with onychomycosis, 1-5 bilateral. No signs of infection noted. Vasc  DP and PT pedal pulses palpable bilaterally. Temperature gradient within normal limits.  Neuro Epicritic and protective threshold sensation diminished bilaterally.  Musculoskeletal Exam No symptomatic pedal deformities noted bilateral. Muscular strength within normal limits.  ASSESSMENT 1. Onychodystrophic nails 1-5 bilateral with hyperkeratosis of nails.  2. Onychomycosis of nail due to dermatophyte bilateral 3. Pain in foot bilateral  PLAN OF CARE 1. Patient evaluated today.  2. Instructed to maintain good pedal hygiene and foot care.  3. Mechanical debridement of nails 1-5 bilaterally performed using a nail nipper. Filed with dremel without incident.  4. Return to clinic in 3 mos.    Renold Kozar M. Mikya Don, DPM [facility]  Dr. Hildagarde Holleran M. Adryan Shin, DPM    [address]                                        [place], [place] [address]                Office [phone]  Fax [phone]      

## 2016-08-14 ENCOUNTER — Other Ambulatory Visit: Payer: Self-pay | Admitting: Family Medicine

## 2016-08-14 NOTE — Telephone Encounter (Signed)
Came to Dr Rosanna Randy but patient sees Carmon Ginsberg, PA_aa

## 2016-08-22 ENCOUNTER — Ambulatory Visit: Payer: Self-pay | Admitting: Family Medicine

## 2016-08-23 ENCOUNTER — Other Ambulatory Visit: Payer: Self-pay | Admitting: Family Medicine

## 2016-08-23 ENCOUNTER — Ambulatory Visit (INDEPENDENT_AMBULATORY_CARE_PROVIDER_SITE_OTHER): Payer: Medicare Other | Admitting: Family Medicine

## 2016-08-23 ENCOUNTER — Encounter: Payer: Self-pay | Admitting: Family Medicine

## 2016-08-23 VITALS — BP 122/56 | HR 85 | Temp 98.4°F | Resp 16 | Wt 129.6 lb

## 2016-08-23 DIAGNOSIS — H00012 Hordeolum externum right lower eyelid: Secondary | ICD-10-CM | POA: Diagnosis not present

## 2016-08-23 DIAGNOSIS — F331 Major depressive disorder, recurrent, moderate: Secondary | ICD-10-CM | POA: Diagnosis not present

## 2016-08-23 DIAGNOSIS — F339 Major depressive disorder, recurrent, unspecified: Secondary | ICD-10-CM

## 2016-08-23 DIAGNOSIS — E782 Mixed hyperlipidemia: Secondary | ICD-10-CM

## 2016-08-23 DIAGNOSIS — N183 Chronic kidney disease, stage 3 unspecified: Secondary | ICD-10-CM

## 2016-08-23 DIAGNOSIS — E039 Hypothyroidism, unspecified: Secondary | ICD-10-CM

## 2016-08-23 DIAGNOSIS — J0110 Acute frontal sinusitis, unspecified: Secondary | ICD-10-CM

## 2016-08-23 MED ORDER — DOXYCYCLINE HYCLATE 100 MG PO TABS: 100.0000 mg | ORAL_TABLET | Freq: Two times a day (BID) | ORAL | 0 refills | Status: DC

## 2016-08-23 NOTE — Patient Instructions (Addendum)
Continue Mucinex and your inhaler. May use saline spray for congestion. Continue warm compresses for your sty. Please have lab results sent to me. Let me know if breathing not improving over the next few days.

## 2016-08-23 NOTE — Progress Notes (Signed)
Subjective:     Patient ID: Sandra Brown, female   DOB: 03/05/1926, 83 y.o.   MRN: [id number]  HPI  Chief Complaint  Patient presents with  . Sinus Problem    Patient comes in ofice today with complaints of right sided sinus pain and pressure for the past ten days. patient reports swelling of the face, head congestion, wheezing and cough. Paitent has been taking otc Mucinex and Tylenol  . Ear Problem    Patient reports that she has a history of inner ear and wanted to address today with physcian since she has had pain and noise in her left ear for the past month.   Reports increased tinnitus with current sx. Previous ENT, Dr. Richardson Landry per her report. Compliant with [facility], pulmonary M.D, Dr. Stevenson Clinch. Patient reports increased sinus pressure,, post nasal drainage and accompanying cough with purulent sputum. Also states she has developed a sty in her right eye. Accompanied by her daughter today.   Review of Systems  Psychiatric/Behavioral:       States wishes to switch psychiatrists due to personality issues/personal preference       Objective:   Physical Exam  Constitutional: She appears well-developed and well-nourished. No distress.  Ears: T.M's intact without inflammation Eyes: right right lower eye lid with a sty Sinuses: non-tender though patient localized pressure to frontal sinuses. Throat: no tonsillar enlargement or exudate Neck: no cervical adenopathy Lungs: clear     Assessment:    1. Acute frontal sinusitis, recurrence not specified - doxycycline (VIBRA-TABS) 100 MG tablet; Take 1 tablet (100 mg total) by mouth 2 (two) times daily.  Dispense: 14 tablet; Refill: 0  2. Hordeolum externum of right lower eyelid  3. Mixed hyperlipidemia - Lipid panel  4. Hypothyroidism, unspecified type - T4, free - TSH  5. CKD (chronic kidney disease) stage 3, GFR 30-59 ml/min - Comprehensive metabolic panel  6. Moderate episode of recurrent major depressive disorder ([place]) -  Ambulatory referral to Psychiatry    Plan:    Continue warm compresses for her sty. Saline spray for congestion. Further f/u pending lab results and psych. Referral.

## 2016-08-27 ENCOUNTER — Telehealth: Payer: Self-pay | Admitting: Family Medicine

## 2016-08-27 NOTE — Telephone Encounter (Signed)
Ok as long as you told the patient.

## 2016-08-27 NOTE — Telephone Encounter (Signed)
Pt advised.

## 2016-08-27 NOTE — Telephone Encounter (Signed)
I called pt's daughter Sandra Brown with appointment to see Dr Nicolasa Ducking.She refused appointment stating that her mother has seen Dr Nicolasa Ducking in past and does not wish to see her again.She will also not see Dr Einar Grad again.I am out of resourses here in [place] until [place] gets new doctor in [place]

## 2016-09-01 DIAGNOSIS — R05 Cough: Secondary | ICD-10-CM | POA: Diagnosis not present

## 2016-09-02 ENCOUNTER — Other Ambulatory Visit: Payer: Self-pay | Admitting: Family Medicine

## 2016-09-02 DIAGNOSIS — R600 Localized edema: Secondary | ICD-10-CM

## 2016-09-10 ENCOUNTER — Encounter: Payer: Self-pay | Admitting: Family Medicine

## 2016-09-20 ENCOUNTER — Other Ambulatory Visit: Payer: Self-pay | Admitting: Family Medicine

## 2016-09-29 ENCOUNTER — Other Ambulatory Visit: Payer: Self-pay | Admitting: Family Medicine

## 2016-09-29 DIAGNOSIS — J309 Allergic rhinitis, unspecified: Secondary | ICD-10-CM

## 2016-10-02 ENCOUNTER — Other Ambulatory Visit: Payer: Self-pay | Admitting: Family Medicine

## 2016-10-11 ENCOUNTER — Telehealth: Payer: Self-pay | Admitting: Family Medicine

## 2016-10-11 NOTE — Telephone Encounter (Signed)
OK to order chest XR for cough

## 2016-10-11 NOTE — Telephone Encounter (Signed)
Patient of [facility] please review. KW 

## 2016-10-11 NOTE — Telephone Encounter (Signed)
[name] from [facility] is calling because patient needs an order for a  Portable chest xray.  She states that the patient has a cough and her right lung "has something going on."  Fax number is [phone].

## 2016-10-11 NOTE — Telephone Encounter (Signed)
Written order faxed.

## 2016-10-12 ENCOUNTER — Other Ambulatory Visit: Payer: Self-pay | Admitting: Family Medicine

## 2016-10-12 DIAGNOSIS — M5136 Other intervertebral disc degeneration, lumbar region: Secondary | ICD-10-CM

## 2016-10-25 ENCOUNTER — Ambulatory Visit (INDEPENDENT_AMBULATORY_CARE_PROVIDER_SITE_OTHER): Payer: Medicare Other | Admitting: Podiatry

## 2016-10-25 DIAGNOSIS — M79676 Pain in unspecified toe(s): Secondary | ICD-10-CM

## 2016-10-25 DIAGNOSIS — B351 Tinea unguium: Secondary | ICD-10-CM | POA: Diagnosis not present

## 2016-10-28 NOTE — Progress Notes (Signed)
   SUBJECTIVE Patient presents to office today complaining of elongated, thickened nails. Pain while ambulating in shoes. Patient is unable to trim their own nails.   Past Medical History:  Diagnosis Date  . Cataract   . COPD (chronic obstructive pulmonary disease) ([place])   . Depression   . Difficulty swallowing   . Frequent headaches   . Hearing loss   . Hypertension   . Hypothyroidism   . Reflux     OBJECTIVE General Patient is awake, alert, and oriented x 3 and in no acute distress. Derm Skin is dry and supple bilateral. Negative open lesions or macerations. Remaining integument unremarkable. Nails are tender, long, thickened and dystrophic with subungual debris, consistent with onychomycosis, 1-5 bilateral. No signs of infection noted. Vasc  DP and PT pedal pulses palpable bilaterally. Temperature gradient within normal limits.  Neuro Epicritic and protective threshold sensation diminished bilaterally.  Musculoskeletal Exam No symptomatic pedal deformities noted bilateral. Muscular strength within normal limits.  ASSESSMENT 1. Onychodystrophic nails 1-5 bilateral with hyperkeratosis of nails.  2. Onychomycosis of nail due to dermatophyte bilateral 3. Pain in foot bilateral  PLAN OF CARE 1. Patient evaluated today.  2. Instructed to maintain good pedal hygiene and foot care.  3. Mechanical debridement of nails 1-5 bilaterally performed using a nail nipper. Filed with dremel without incident.  4. Return to clinic in 3 mos.    Edrick Kins, DPM [facility]  Dr. Edrick Kins, [address]                                        [place], [place] [address]                Office [phone]  Fax [phone]

## 2016-11-12 ENCOUNTER — Other Ambulatory Visit: Payer: Self-pay | Admitting: Family Medicine

## 2016-11-13 ENCOUNTER — Other Ambulatory Visit: Payer: Self-pay | Admitting: Family Medicine

## 2016-11-13 MED ORDER — DEXLANSOPRAZOLE 60 MG PO CPDR: 60.0000 mg | DELAYED_RELEASE_CAPSULE | Freq: Every day | ORAL | 1 refills | Status: DC

## 2016-11-18 ENCOUNTER — Ambulatory Visit (INDEPENDENT_AMBULATORY_CARE_PROVIDER_SITE_OTHER): Payer: Medicare Other | Admitting: Psychiatry

## 2016-11-18 ENCOUNTER — Encounter: Payer: Self-pay | Admitting: Psychiatry

## 2016-11-18 VITALS — BP 110/70 | HR 62 | Ht 60.0 in | Wt 127.2 lb

## 2016-11-18 DIAGNOSIS — F331 Major depressive disorder, recurrent, moderate: Secondary | ICD-10-CM | POA: Diagnosis not present

## 2016-11-18 MED ORDER — VENLAFAXINE HCL ER 150 MG PO CP24: 150.0000 mg | ORAL_CAPSULE | Freq: Every day | ORAL | 0 refills | Status: DC

## 2016-11-18 MED ORDER — VENLAFAXINE HCL ER 37.5 MG PO CP24: 37.5000 mg | ORAL_CAPSULE | Freq: Every day | ORAL | 2 refills | Status: DC

## 2016-11-18 NOTE — Progress Notes (Signed)
Patient ID: Sandra Brown, female   DOB: 1926-10-08, 83 y.o.   MRN: [id number] [facility] MD/PA/NP OP Progress Note  11/18/2016 2:25 PM Sandra Brown  MRN:  [id number]   Subjective:  Patient returns a follow-up of her major depressive disorder. Patient has not returned to the clinic in 6 months. Today she states that she has been feeling somewhat depressed recently. States that she is not feeling as energetic and as motivated as she normally does. Reports good sleep. She goes to bed at 2 AM and wakes up at 9 AM. She came in with a friend who also states that patient has not been herself lately. States that she does have very bright lights in her living quarters. Denies any suicidal thoughts. Denies any other problems.  Chief Complaint: Most recently depressed   Visit Diagnosis:     ICD-10-CM   1. Major depressive disorder, recurrent episode, moderate (HCC) F33.1     Past Medical History:  Past Medical History:  Diagnosis Date  . Cataract   . COPD (chronic obstructive pulmonary disease) ([place])   . Depression   . Difficulty swallowing   . Frequent headaches   . Hearing loss   . Hypertension   . Hypothyroidism   . Reflux     Past Surgical History:  Procedure Laterality Date  . ABDOMINAL HYSTERECTOMY    . APPENDECTOMY    . LUMBAR LAMINECTOMY    . PARATHYROIDECTOMY    . TOTAL HIP ARTHROPLASTY     x 4   Family History:  Family History  Problem Relation Age of Onset  . Stroke Mother   . Hypertension Mother   . Heart disease Father   . Hypertension Father    Social History:  Social History   Socioeconomic History  . Marital status: Widowed    Spouse name: None  . Number of children: None  . Years of education: None  . Highest education level: None  Social Needs  . Financial resource strain: None  . Food insecurity - worry: None  . Food insecurity - inability: None  . Transportation needs - medical: None  . Transportation needs - non-medical: None  Occupational  History  . None  Tobacco Use  . Smoking status: Never Smoker  . Smokeless tobacco: Never Used  . Tobacco comment: quit 1954  Substance and Sexual Activity  . Alcohol use: No  . Drug use: No  . Sexual activity: No  Other Topics Concern  . None  Social History Narrative  . None   Additional History:   Assessment:   Musculoskeletal: Strength & Muscle Tone: She ambulates slowly but without any assistance Gait & Station: normal Patient leans: N/A  Psychiatric Specialty Exam: Medication Refill     Review of Systems  Psychiatric/Behavioral: Negative for depression, hallucinations, memory loss, substance abuse and suicidal ideas. The patient is not nervous/anxious and does not have insomnia.   All other systems reviewed and are negative.   Blood pressure 110/70, pulse 62, height 5' (1.524 m), weight 127 lb 3.2 oz (57.7 kg).Body mass index is 24.84 kg/m.  General Appearance: Neat and Well Groomed  Eye Contact:  Good  Speech:  Normal Rate  Volume:  Normal  Mood:  Occasional depression  Affect:  smiling  Thought Process:  Linear and Logical  Orientation:  Full (Time, Place, and Person)  Thought Content:  Negative  Suicidal Thoughts:  No  Homicidal Thoughts:  No  Memory:  Immediate;   Good Recent;   Good  Remote;   Good  Judgement:  Good  Insight:  Good  Psychomotor Activity:  Negative  Concentration:  Good  Recall:  Good  Fund of Knowledge: Good  Language: Good  Akathisia:  Negative  Handed:  Right unknown   AIMS (if indicated):  N/A  Assets:  Communication Skills Desire for Improvement Social Support  ADL's:  Intact  Cognition: WNL  Sleep:  better   Is the patient at risk to self?  No. Has the patient been a risk to self in the past 6 months?  No. Has the patient been a risk to self within the distant past?  No. Is the patient a risk to others?  No. Has the patient been a risk to others in the past 6 months?  No. Has the patient been a risk to others within  the distant past?  No.  Current Medications: Current Outpatient Medications  Medication Sig Dispense Refill  . acetaminophen (TYLENOL) 500 MG tablet Take 1 tablet (500 mg total) by mouth every 6 (six) hours as needed for moderate pain. Also give 500 mg three times a day for 7  Days. 60 tablet 0  . ALPRAZolam (XANAX) 0.25 MG tablet Take 1 tablet as needed 1-2 times a month for anxiety 15 tablet 0  . AMBULATORY NON FORMULARY MEDICATION Medication Name: incentive spirometry Use as directed 1 each 0  . CALCIUM PO Take by mouth.    . celecoxib (CELEBREX) 200 MG capsule TAKE 1 CAPSULE DAILY 90 capsule 1  . [facility] RANITIDINE 75 MG tablet Take 75 mg by mouth 2 (two) times daily as needed.   3  . dexlansoprazole (DEXILANT) 60 MG capsule Take 1 capsule (60 mg total) daily by mouth. 90 capsule 1  . dextromethorphan (DELSYM) 30 MG/5ML liquid Take by mouth 2 (two) times daily as needed for cough.    . diphenoxylate-atropine (LOMOTIL) 2.5-0.025 MG tablet Take by mouth 4 (four) times daily as needed for diarrhea or loose stools.    Marland Kitchen doxycycline (VIBRA-TABS) 100 MG tablet Take 1 tablet (100 mg total) by mouth 2 (two) times daily. 14 tablet 0  . fluticasone (FLONASE) 50 MCG/ACT nasal spray USE 2 SPRAYS NASALLY DAILY 48 g 2  . furosemide (LASIX) 40 MG tablet Take 1 tablet (40 mg total) by mouth every morning. 90 tablet 3  . guaiFENesin (MUCINEX) 600 MG 12 hr tablet Take 400 mg by mouth 2 (two) times daily.     . hyoscyamine (OSCIMIN) 0.125 MG tablet One pill twice daily 180 tablet 3  . levocetirizine (XYZAL) 5 MG tablet TAKE 1 TABLET AT BEDTIME EVERY NIGHT 90 tablet 1  . levothyroxine (SYNTHROID, LEVOTHROID) 50 MCG tablet Take 1 tablet (50 mcg total) by mouth daily. 90 tablet 3  . lidocaine (LIDODERM) 5 % Place 1 patch onto the skin daily. Remove & Discard patch within 12 hours or as directed by MD 30 patch 5  . loperamide (IMODIUM) 2 MG capsule Take 2 mg by mouth as needed for diarrhea or loose stools.    .  magnesium gluconate (MAGONATE) 500 MG tablet Take 500 mg by mouth daily.    . meclizine (ANTIVERT) 12.5 MG tablet Take 12.5 mg by mouth every 4 (four) hours as needed.     . montelukast (SINGULAIR) 10 MG tablet TAKE 1 TABLET DAILY 90 tablet 3  . Multiple Vitamin (MULTIVITAMIN) tablet Take 1 tablet by mouth daily.    . Probiotic Product (PROBIOTIC COLON SUPPORT PO) Take 1 capsule by mouth  daily.    . simvastatin (ZOCOR) 20 MG tablet Take 1 tablet (20 mg total) by mouth daily. 90 tablet 3  . SPIRIVA HANDIHALER 18 MCG inhalation capsule INHALE THE CONTENTS OF 1 CAPSULE DAILY 90 capsule 3  . spironolactone (ALDACTONE) 25 MG tablet TAKE 1 TABLET DAILY (TIME FOR OFFICE VISIT) 90 tablet 0  . tamsulosin (FLOMAX) 0.4 MG CAPS capsule TAKE 1 CAPSULE DAILY 90 capsule 1  . venlafaxine XR (EFFEXOR XR) 37.5 MG 24 hr capsule Take 1 capsule (37.5 mg total) daily by mouth. 30 capsule 2  . venlafaxine XR (EFFEXOR-XR) 150 MG 24 hr capsule Take 1 capsule (150 mg total) daily by mouth. 90 capsule 0   Current Facility-Administered Medications  Medication Dose Route Frequency Provider Last Rate Last Dose  . orphenadrine (NORFLEX) injection 60 mg  60 mg Intramuscular Once Mohammed Kindle, MD        Medical Decision Making:  Established Problem, Stable/Improving (1) and Review of New Medication or Change in Dosage (2)  Treatment Plan Summary:Medication management and Plan   Major depressive disorder, recurrent, moderate.  Increase effexor to 187.5mg  po qd. Continue  Alprazolam but her primary care physician. Patient has not received this medication from this clinic. Patient encouraged to have bright lights in the house during the winter season.  Patient will follow up in 1 months.  She will transition to Dr.Eappen at this time. Patient was introduced to Dr. Shea Evans during today`s visit. She has been encouraged to call the clinic with any questions or concerns.  Sandra Brown 11/18/2016, 2:25 PM

## 2016-11-20 ENCOUNTER — Telehealth: Payer: Self-pay

## 2016-11-20 NOTE — Telephone Encounter (Signed)
[facility] called pt needs a refill on xanax.  pt is a dr. Einar Grad pt but dr. Shea Evans will continue care. dr. Shea Evans has not seen pt yet. pt has appt with dr. Shea Evans on 12-11-16.   ALPRAZolam (XANAX) 0.25 MG tablet  Medication  Date: 02/29/2016 Department: [facility] Ordering/Authorizing: Sandra So, MD  Order Providers   Prescribing Provider Encounter Provider  Sandra So, MD Sandra So, MD  Medication Detail    Disp Refills Start End   ALPRAZolam [name]) 0.25 MG tablet 15 tablet 0 02/29/2016    Sig: Take 1 tablet as needed 1-2 times a month for anxiety   Class: Print

## 2016-11-25 NOTE — Telephone Encounter (Signed)
Patient was prescribed xanax in February - 02/29/2016 - for 7 days - 17 pills per Dr.Ravi. Will not refill now. Will evaluate patient for alternative medication options when she is here for appointment.

## 2016-11-25 NOTE — Telephone Encounter (Signed)
PT NEEDS REFILL ON XANAX, PT HAS NOT SEEN DR. Shea Evans YET.

## 2016-11-25 NOTE — Telephone Encounter (Signed)
DID YOU SEND IN RX?

## 2016-11-25 NOTE — Telephone Encounter (Signed)
OK sure .

## 2016-11-26 ENCOUNTER — Other Ambulatory Visit: Payer: Self-pay | Admitting: Family Medicine

## 2016-11-26 DIAGNOSIS — K589 Irritable bowel syndrome without diarrhea: Secondary | ICD-10-CM

## 2016-11-26 DIAGNOSIS — E782 Mixed hyperlipidemia: Secondary | ICD-10-CM

## 2016-11-26 DIAGNOSIS — R600 Localized edema: Secondary | ICD-10-CM

## 2016-11-26 DIAGNOSIS — E890 Postprocedural hypothyroidism: Secondary | ICD-10-CM

## 2016-12-01 ENCOUNTER — Other Ambulatory Visit: Payer: Self-pay | Admitting: Family Medicine

## 2016-12-01 DIAGNOSIS — R600 Localized edema: Secondary | ICD-10-CM

## 2016-12-11 ENCOUNTER — Ambulatory Visit: Payer: Medicare Other | Admitting: Psychiatry

## 2016-12-25 NOTE — Telephone Encounter (Signed)
Pt had appt with dr. Shea Evans for 12-11-16 but pt no showed.  Note closed dr. Einar Grad gone until the end of January.  Dr. Shea Evans will not refill without seeing pt.

## 2017-01-31 ENCOUNTER — Encounter: Payer: Self-pay | Admitting: Podiatry

## 2017-01-31 ENCOUNTER — Ambulatory Visit: Payer: Medicare Other | Admitting: Podiatry

## 2017-01-31 ENCOUNTER — Ambulatory Visit (INDEPENDENT_AMBULATORY_CARE_PROVIDER_SITE_OTHER): Payer: Medicare Other | Admitting: Podiatry

## 2017-01-31 DIAGNOSIS — M79676 Pain in unspecified toe(s): Secondary | ICD-10-CM | POA: Diagnosis not present

## 2017-01-31 DIAGNOSIS — B351 Tinea unguium: Secondary | ICD-10-CM

## 2017-02-02 NOTE — Progress Notes (Signed)
   SUBJECTIVE Patient presents to office today complaining of elongated, thickened nails. Pain while ambulating in shoes. Patient is unable to trim their own nails.   Past Medical History:  Diagnosis Date  . Cataract   . COPD (chronic obstructive pulmonary disease) ([place])   . Depression   . Difficulty swallowing   . Frequent headaches   . Hearing loss   . Hypertension   . Hypothyroidism   . Reflux     OBJECTIVE General Patient is awake, alert, and oriented x 3 and in no acute distress. Derm Skin is dry and supple bilateral. Negative open lesions or macerations. Remaining integument unremarkable. Nails are tender, long, thickened and dystrophic with subungual debris, consistent with onychomycosis, 1-5 bilateral. No signs of infection noted. Vasc  DP and PT pedal pulses palpable bilaterally. Temperature gradient within normal limits.  Neuro Epicritic and protective threshold sensation diminished bilaterally.  Musculoskeletal Exam No symptomatic pedal deformities noted bilateral. Muscular strength within normal limits.  ASSESSMENT 1. Onychodystrophic nails 1-5 bilateral with hyperkeratosis of nails.  2. Onychomycosis of nail due to dermatophyte bilateral 3. Pain in foot bilateral  PLAN OF CARE 1. Patient evaluated today.  2. Instructed to maintain good pedal hygiene and foot care.  3. Mechanical debridement of nails 1-5 bilaterally performed using a nail nipper. Filed with dremel without incident.  4. Return to clinic in 3 mos.    Edrick Kins, DPM [facility]  Dr. Edrick Kins, [address]                                        [place], [place] [address]                Office [phone]  Fax [phone]

## 2017-02-06 ENCOUNTER — Other Ambulatory Visit: Payer: Self-pay | Admitting: Psychiatry

## 2017-02-13 ENCOUNTER — Other Ambulatory Visit: Payer: Self-pay | Admitting: Psychiatry

## 2017-02-14 ENCOUNTER — Telehealth: Payer: Self-pay | Admitting: Family Medicine

## 2017-02-14 ENCOUNTER — Ambulatory Visit (INDEPENDENT_AMBULATORY_CARE_PROVIDER_SITE_OTHER): Payer: Medicare Other | Admitting: Internal Medicine

## 2017-02-14 ENCOUNTER — Encounter: Payer: Self-pay | Admitting: Internal Medicine

## 2017-02-14 ENCOUNTER — Other Ambulatory Visit: Payer: Self-pay | Admitting: Family Medicine

## 2017-02-14 VITALS — BP 104/60 | HR 96 | Resp 16 | Ht 60.0 in | Wt 126.0 lb

## 2017-02-14 DIAGNOSIS — J449 Chronic obstructive pulmonary disease, unspecified: Secondary | ICD-10-CM

## 2017-02-14 NOTE — Telephone Encounter (Signed)
Please review chart . KW 

## 2017-02-14 NOTE — Telephone Encounter (Signed)
[id number] 

## 2017-02-14 NOTE — Patient Instructions (Signed)
Stop singulair, continue other medications.

## 2017-02-14 NOTE — Progress Notes (Signed)
* [facility]    Assessment and Plan: 84 year old female with history of recurrent pulmonary infections and COPD seen for follow-up visit   COPD (chronic obstructive pulmonary disease) --She has am mucus production minimal dyspnea.  No recent exacerbations or chest infections, and appears to be doing quite well without significant exertional dyspnea. --She can stop singulair, continue spiriva.    Date: 02/14/2017  MRN# [id number] Sandra Brown 10/01/1926   Sandra Brown is a 84 y.o. old female seen in follow up for chief complaint of  Chief Complaint  Patient presents with  . COPD    Former Dr. Stevenson Clinch patient last seen 2017.  Marland Kitchen Cough    mostly am and at bedtimes  . Shortness of Breath    only with exertion  . Hoarse    in the evening     HPI:  Patient presents today for follow-up visit of COPD.She is present with her daughter who gives some of the history. She feels that overall her breathing has been doing well. She has occasional cough with am mucus.  She is using spiriva once daily, flonase 2 sprays in each nostril once per day, mucinex bid.  In the 2 years since her last visit she has not had any hospital admissions or respiratory infections.  She walks with a wheeled walker, she goes on a indoor track, she is not limited by breathing.   Imaging personally reviewed, CT chest 07/27/14, emphysema with hyperinflation, hiatal hernia.  Medication:    Current Outpatient Medications:  .  acetaminophen (TYLENOL) 500 MG tablet, Take 1 tablet (500 mg total) by mouth every 6 (six) hours as needed for moderate pain. Also give 500 mg three times a day for 7  Days., Disp: 60 tablet, Rfl: 0 .  ALPRAZolam (XANAX) 0.25 MG tablet, Take 1 tablet as needed 1-2 times a month for anxiety, Disp: 15 tablet, Rfl: 0 .  AMBULATORY NON FORMULARY MEDICATION, Medication Name: incentive spirometry Use as directed, Disp: 1 each, Rfl: 0 .  CALCIUM PO, Take by mouth., Disp: ,  Rfl:  .  celecoxib (CELEBREX) 200 MG capsule, TAKE 1 CAPSULE DAILY, Disp: 90 capsule, Rfl: 1 .  [facility] RANITIDINE 75 MG tablet, Take 75 mg by mouth 2 (two) times daily as needed. , Disp: , Rfl: 3 .  dexlansoprazole (DEXILANT) 60 MG capsule, Take 1 capsule (60 mg total) daily by mouth., Disp: 90 capsule, Rfl: 1 .  dextromethorphan (DELSYM) 30 MG/5ML liquid, Take by mouth 2 (two) times daily as needed for cough., Disp: , Rfl:  .  diphenoxylate-atropine (LOMOTIL) 2.5-0.025 MG tablet, Take by mouth 4 (four) times daily as needed for diarrhea or loose stools., Disp: , Rfl:  .  doxycycline (VIBRA-TABS) 100 MG tablet, Take 1 tablet (100 mg total) by mouth 2 (two) times daily., Disp: 14 tablet, Rfl: 0 .  fluticasone (FLONASE) 50 MCG/ACT nasal spray, USE 2 SPRAYS NASALLY DAILY, Disp: 48 g, Rfl: 2 .  furosemide (LASIX) 40 MG tablet, Take 1 tablet (40 mg total) daily by mouth., Disp: 90 tablet, Rfl: 3 .  guaiFENesin (MUCINEX) 600 MG 12 hr tablet, Take 400 mg by mouth 2 (two) times daily. , Disp: , Rfl:  .  levocetirizine (XYZAL) 5 MG tablet, TAKE 1 TABLET AT BEDTIME EVERY NIGHT, Disp: 90 tablet, Rfl: 1 .  levothyroxine (SYNTHROID, LEVOTHROID) 50 MCG tablet, Take 1 tablet (50 mcg total) daily by mouth., Disp: 90 tablet, Rfl: 3 .  lidocaine (LIDODERM) 5 %, Place  1 patch onto the skin daily. Remove & Discard patch within 12 hours or as directed by MD, Disp: 30 patch, Rfl: 5 .  loperamide (IMODIUM) 2 MG capsule, Take 2 mg by mouth as needed for diarrhea or loose stools., Disp: , Rfl:  .  magnesium gluconate (MAGONATE) 500 MG tablet, Take 500 mg by mouth daily., Disp: , Rfl:  .  meclizine (ANTIVERT) 12.5 MG tablet, Take 12.5 mg by mouth every 4 (four) hours as needed. , Disp: , Rfl:  .  montelukast (SINGULAIR) 10 MG tablet, TAKE 1 TABLET DAILY, Disp: 90 tablet, Rfl: 3 .  Multiple Vitamin (MULTIVITAMIN) tablet, Take 1 tablet by mouth daily., Disp: , Rfl:  .  OSCIMIN 0.125 MG tablet, TAKE 1 TABLET TWICE A DAY, Disp:  180 tablet, Rfl: 3 .  Probiotic Product (PROBIOTIC COLON SUPPORT PO), Take 1 capsule by mouth daily., Disp: , Rfl:  .  simvastatin (ZOCOR) 20 MG tablet, Take 1 tablet (20 mg total) daily by mouth., Disp: 90 tablet, Rfl: 3 .  SPIRIVA HANDIHALER 18 MCG inhalation capsule, INHALE THE CONTENTS OF 1 CAPSULE DAILY, Disp: 90 capsule, Rfl: 3 .  spironolactone (ALDACTONE) 25 MG tablet, Take 1 tablet (25 mg total) by mouth daily., Disp: 90 tablet, Rfl: 0 .  tamsulosin (FLOMAX) 0.4 MG CAPS capsule, TAKE 1 CAPSULE DAILY, Disp: 90 capsule, Rfl: 1 .  venlafaxine XR (EFFEXOR XR) 37.5 MG 24 hr capsule, Take 1 capsule (37.5 mg total) daily by mouth., Disp: 30 capsule, Rfl: 2 .  venlafaxine XR (EFFEXOR-XR) 150 MG 24 hr capsule, Take 1 capsule (150 mg total) daily by mouth., Disp: 90 capsule, Rfl: 0  Current Facility-Administered Medications:  .  orphenadrine (NORFLEX) injection 60 mg, 60 mg, Intramuscular, Once, Mohammed Kindle, MD   Allergies:  Sulfa antibiotics; Erythromycin; Aspirin; Contrast media [iodinated diagnostic agents]; Latex; Penicillin g; Penicillins; and Tape  Review of Systems: Gen:  Denies  fever, sweats. HEENT: Denies blurred vision. Cvc:  No dizziness, chest pain or heaviness Resp:   Denies cough or sputum porduction. Gi: Denies swallowing difficulty, stomach pain. constipation, bowel incontinence Gu:  Denies bladder incontinence, burning urine Ext:   No Joint pain, stiffness. Skin: No skin rash, easy bruising. Endoc:  No polyuria, polydipsia. Psych: No depression, insomnia. Other:  All other systems were reviewed and found to be negative other than what is mentioned in the HPI.   Physical Examination:   VS: BP 104/60 (BP Location: Left Arm, Cuff Size: Normal)   Pulse 96   Resp 16   Ht 5' (1.524 m)   Wt 126 lb (57.2 kg)   SpO2 93%   BMI 24.61 kg/m   General Appearance: No distress  Neuro:without focal findings,  speech normal,  HEENT: PERRLA, EOM intact. Pulmonary: normal  breath sounds, No wheezing.   CardiovascularNormal S1,S2.  No m/r/g.   Abdomen: Benign, Soft, non-tender. Renal:  No costovertebral tenderness  GU:  Not performed at this time. Endoc: No evident thyromegaly, no signs of acromegaly. Skin:   warm, no rash. Extremities: normal, no cyanosis, clubbing.   LABORATORY PANEL:   CBC No results for input(s): WBC, HGB, HCT, PLT in the last 168 hours. ------------------------------------------------------------------------------------------------------------------  Chemistries  No results for input(s): NA, K, CL, CO2, GLUCOSE, BUN, CREATININE, CALCIUM, MG, AST, ALT, ALKPHOS, BILITOT in the last 168 hours.  Invalid input(s): GFRCGP ------------------------------------------------------------------------------------------------------------------  Cardiac Enzymes No results for input(s): TROPONINI in the last 168 hours. ------------------------------------------------------------  RADIOLOGY:   No results found for this or  any previous visit. No results found for this or any previous visit. ------------------------------------------------------------------------------------------------------------------  Thank  you for allowing [facility] to assist in the care of your patient. Our recommendations are noted above.  Please contact us if we can be of further service.   Marda Stalker, MD.  [facility] Office Number: [phone]  Patricia Pesa, M.D.  Merton Border, M.D  02/14/2017

## 2017-02-14 NOTE — Telephone Encounter (Signed)
Wilford Sports with [facility] is requesting refills for the following medications:   1. venlafaxine XR (EFFEXOR-XR) 150 MG 24 hr capsule 2. venlafaxine XR (EFFEXOR XR) 37.5 MG 24 hr capsule   [facility] stated that pt usually gets this medication from Dr. Einar Grad. [name] stated that she has requested a refill from his office and hasn't gotten a response. [name] stated pt's daughter is concerned for pt to run out of the medication and pt has enough medication until the end of next week. [name] stated that if this could be done for 30 day supply it would be great.  Please advise. Thanks TNP

## 2017-02-14 NOTE — Telephone Encounter (Signed)
According to the EMR there is an order pending from Dr. Einar Grad for refill. If not refilled by Monday recontact me for assistance

## 2017-02-17 NOTE — Telephone Encounter (Signed)
pt needs refills on her medications 

## 2017-02-17 NOTE — Telephone Encounter (Signed)
Wilford Sports from [facility] is calling you back and is requesting a call back.

## 2017-02-17 NOTE — Telephone Encounter (Signed)
spoke with nurse pt needs an appt to be seen last seen in nov last year no showed to followup appt

## 2017-02-18 ENCOUNTER — Other Ambulatory Visit: Payer: Self-pay | Admitting: Family Medicine

## 2017-02-18 MED ORDER — VENLAFAXINE HCL ER 150 MG PO CP24: 150.0000 mg | ORAL_CAPSULE | Freq: Every day | ORAL | 0 refills | Status: DC

## 2017-02-18 MED ORDER — VENLAFAXINE HCL ER 37.5 MG PO CP24: 37.5000 mg | ORAL_CAPSULE | Freq: Every day | ORAL | 0 refills | Status: DC

## 2017-02-18 NOTE — Telephone Encounter (Signed)
She will follow-up with Dr. Shea Evans. During her visit in November I introduced her to Dr. Shea Evans and she was supposed to come and see her in a month. This is documented in my note from 11/18/2016

## 2017-02-18 NOTE — Telephone Encounter (Signed)
30 pills of each sent in.

## 2017-02-18 NOTE — Telephone Encounter (Signed)
Please see messages below and review chart. KW

## 2017-02-18 NOTE — Telephone Encounter (Signed)
No.  I spoke with nurse yesterday and told them that she needed to be seen that she needed an appt.

## 2017-02-18 NOTE — Telephone Encounter (Signed)
Sandra Brown with [facility] called for a status update on the Rx for venlafaxine XR (EFFEXOR-XR) 150 MG 24 hr capsule & venlafaxine XR (EFFEXOR XR) 37.5 MG 24 hr capsule to [place]. [name] stated that pt is about out of the medication and that because pt missed her December appt with Dr. Einar Grad he won't refill the medication without an Rx. [name] stated they are working on getting pt an appt with Dr. Einar Grad but they are requesting [name] to fill it for a 30 day supply so she doesn't run out of the medication. Please advise. Thanks TNP

## 2017-02-18 NOTE — Telephone Encounter (Signed)
Does she have an upcoming appointment?

## 2017-02-26 ENCOUNTER — Other Ambulatory Visit: Payer: Self-pay

## 2017-02-26 ENCOUNTER — Encounter: Payer: Self-pay | Admitting: Psychiatry

## 2017-02-26 ENCOUNTER — Ambulatory Visit (INDEPENDENT_AMBULATORY_CARE_PROVIDER_SITE_OTHER): Payer: Medicare Other | Admitting: Psychiatry

## 2017-02-26 VITALS — BP 123/68 | HR 97 | Temp 97.6°F | Wt 128.8 lb

## 2017-02-26 DIAGNOSIS — F331 Major depressive disorder, recurrent, moderate: Secondary | ICD-10-CM

## 2017-02-26 MED ORDER — VENLAFAXINE HCL ER 37.5 MG PO CP24
37.5000 mg | ORAL_CAPSULE | Freq: Every day | ORAL | 0 refills | Status: DC
Start: 2017-02-26 — End: 2017-06-27

## 2017-02-26 MED ORDER — VENLAFAXINE HCL ER 150 MG PO CP24: 150.0000 mg | ORAL_CAPSULE | Freq: Every day | ORAL | 0 refills | Status: DC

## 2017-02-26 MED ORDER — RAMELTEON 8 MG PO TABS
8.0000 mg | ORAL_TABLET | Freq: Every day | ORAL | 2 refills | Status: DC
Start: 2017-02-26 — End: 2017-09-05

## 2017-02-26 NOTE — Patient Instructions (Signed)
Ramelteon tablets  What is this medicine?  RAMELTEON (ram EL tee on) is used to treat insomnia. This medicine helps you to fall asleep.  This medicine may be used for other purposes; ask your health care provider or pharmacist if you have questions.  COMMON BRAND NAME(S): Rozerem  What should I tell my health care provider before I take this medicine?  They need to know if you have any of these conditions:  -depression  -history of a drug or alcohol abuse problem  -liver disease  -lung or breathing disease  -suicidal thoughts  -an unusual or allergic reaction to ramelteon, other medicines, foods, dyes, or preservatives  -pregnant or trying to get pregnant  -breast-feeding  How should I use this medicine?  Take this medicine by mouth with a glass of water. Do not break tablets; swallow whole. Follow the directions on the prescription label. It is better to take this medicine on an empty stomach and only when you are ready for bed. Do not take your medicine more often than directed.  A special MedGuide will be given to you by the pharmacist with each prescription and refill. Be sure to read this information carefully each time.  Talk to your pediatrician regarding the use of this medicine in children. Special care may be needed.  Overdosage: If you think you have taken too much of this medicine contact a poison control center or emergency room at once.  NOTE: This medicine is only for you. Do not share this medicine with others.  What if I miss a dose?  This does not apply. This medicine should only be taken immediately before going to sleep. Do not take double or extra doses.  What may interact with this medicine?  Do not take this medicine with any of the following medications:  -fluvoxamine  -melatonin  This medicine may also interact with the following medications:  -medicines used to treat fungal infections like ketoconazole, fluconazole, or itraconazole  -rifampin  This list may not describe all possible  interactions. Give your health care provider a list of all the medicines, herbs, non-prescription drugs, or dietary supplements you use. Also tell them if you smoke, drink alcohol, or use illegal drugs. Some items may interact with your medicine.  What should I watch for while using this medicine?  Visit your doctor or health care professional for regular checks on your progress. Keep a regular sleep schedule by going to bed at about the same time each night. Avoid caffeine-containing drinks in the evening hours. Talk to your doctor if you still have trouble sleeping within 7 to 10 days of using this medicine. This may mean there is another cause for your sleep problems.  After taking this medicine for sleep, you may get up out of bed while not being fully awake and do an activity that you do not know you are doing. The next morning, you may have no memory of the event. Activities such as driving a car ("sleep-driving"), making and eating food, talking on the phone, sexual activity, and sleep-walking have been reported. Call your doctor right away if you find out you have done any of these activities. Do not take this medicine if you drink alcohol or have taken another medicine for sleep, since your risk of doing these sleep-related activities will be increased.  Do not take this medicine unless you are able to stay in bed for a full night (7 to 8 hours) before you must be active again. You   may have a decrease in mental alertness the day after use, even if you feel that you are fully awake. Tell your doctor if you will need to perform activities requiring full alertness, such as driving, the next day. Do not stand or sit up quickly after taking this medicine, especially if you are an older patient. This reduces the risk of dizzy or fainting spells.  If you or your family notice any changes in your behavior, such as new or worsening depression, thoughts of harming yourself, anxiety, other unusual or disturbing  thoughts, or memory loss, call your doctor right away.  What side effects may I notice from receiving this medicine?  Side effects that you should report to your doctor or health care professional as soon as possible:  -allergic reactions like skin rash, itching or hives, swelling of the face, lips, or tongue  -breast milk production or discharge  -breathing problems  -joint or muscle pain  -depression, suicidal thoughts  -missed monthly period (for women)  -unusual activities while asleep like driving, eating, making phone calls  -unusually weak or tired  -worsening of insomnia  Side effects that usually do not require medical attention (report to your doctor or health care professional if they continue or are bothersome):  -bad taste  -daytime sleepiness  -decreased sex drive  -diarrhea  -headache  -nausea  This list may not describe all possible side effects. Call your doctor for medical advice about side effects. You may report side effects to FDA at 1-800-FDA-1088.  Where should I keep my medicine?  Keep out of the reach of children.  Store at room temperature between 15 and 30 degrees C (59 and 86 degrees F). Keep the container tightly closed. Protect from moisture and humidity. Throw away any unused medicine after the expiration date.  NOTE: This sheet is a summary. It may not cover all possible information. If you have questions about this medicine, talk to your doctor, pharmacist, or health care provider.   2018 Elsevier/Gold Standard (2013-08-24 18:21:29)

## 2017-02-26 NOTE — Progress Notes (Signed)
[facility] OP Progress Note  02/26/2017 4:46 PM Sandra Brown  MRN:  [id number]  Chief Complaint: ' I am ok."  Chief Complaint    Follow-up; Medication Refill     HPI: Sandra Brown is an 84 year old Caucasian female, widowed, lives at [facility], presented to the clinic today for a follow-up visit.  Patient has a history of depression and sleep problems.  She also has multiple medical problems including COPD, GERD, hypothyroidism.  She used to follow up with Dr. Einar Grad and was transitioned to Probation officer.  This is her first visit with Probation officer.   She reports she continues to have some tiredness.  She reports she also has COPD and wonders whether her tiredness could be related to her medical problems.  She otherwise reports she is tolerating her Effexor well.  She reports she has a lot of friends at the assisted living facility and enjoys them.  She reports one of her friends passed away recently and that made her sad.  She reports however she has been coping well.  She continues to have sleep problems.  She reports she has difficulty falling asleep.  She goes to bed at 2 AM and wakes up around 9 or 10 AM in the morning.  She reports she has tried sleep aids in the past but did not tolerate them well.  She initially said she is worried about starting a sleep aid but then agreed that she may try a new medication.  Discussed the medication with patient, provided medication education and she agrees with plan.  She denies any other concerns at this time.  She appeared alert, oriented and was able to provide details about her personal information as well as medications.  Visit Diagnosis:    ICD-10-CM   1. MDD (major depressive disorder), recurrent episode, moderate (HCC) F33.1 venlafaxine XR (EFFEXOR XR) 37.5 MG 24 hr capsule    venlafaxine XR (EFFEXOR-XR) 150 MG 24 hr capsule    ramelteon (ROZEREM) 8 MG tablet    Past Psychiatric History: History of depression since the past several  years.  She reports her depression initially started after she lost her dogs.  She  used to follow up with Dr. Einar Grad here in clinic.  Past Medical History:  Past Medical History:  Diagnosis Date  . Cataract   . COPD (chronic obstructive pulmonary disease) ([place])   . Depression   . Difficulty swallowing   . Frequent headaches   . Hearing loss   . Hypertension   . Hypothyroidism   . Reflux     Past Surgical History:  Procedure Laterality Date  . ABDOMINAL HYSTERECTOMY    . APPENDECTOMY    . LUMBAR LAMINECTOMY    . PARATHYROIDECTOMY    . TOTAL HIP ARTHROPLASTY     x 4    Family Psychiatric History: Denies any significant history of depression or anxiety in her family.  Family History:  Family History  Problem Relation Age of Onset  . Stroke Mother   . Hypertension Mother   . Heart disease Father   . Hypertension Father    Substance abuse history: Denies  Social History: Currently lives at an assisted living facility, [facility].  She is widowed.  She reports she has 3 children.  She has 2 daughters and 1 son.  One of her daughters lives in [place] and is very supportive.  Her husband used to be in the [employer].  He passed away. Social History   Socioeconomic  History  . Marital status: Widowed    Spouse name: None  . Number of children: None  . Years of education: None  . Highest education level: None  Social Needs  . Financial resource strain: None  . Food insecurity - worry: None  . Food insecurity - inability: None  . Transportation needs - medical: None  . Transportation needs - non-medical: None  Occupational History  . None  Tobacco Use  . Smoking status: Never Smoker  . Smokeless tobacco: Never Used  . Tobacco comment: quit 1954  Substance and Sexual Activity  . Alcohol use: No  . Drug use: No  . Sexual activity: No  Other Topics Concern  . None  Social History Narrative  . None    Allergies:  Allergies  Allergen Reactions  . Sulfa Antibiotics Rash and  Itching    Other reaction(s): Diarrhea and vomiting (finding)  . Erythromycin Nausea And Vomiting    Other reaction(s): Diarrhea and vomiting (finding)  . Aspirin Other (See Comments), Tinitus and Nausea And Vomiting    Ringing of the ears, caused hearing loss both ears Ringing in ears  . Contrast Media [Iodinated Diagnostic Agents] Rash and Hives  . Latex Rash and Itching  . Penicillin G Rash  . Penicillins Rash    Other reaction(s): UNKNOWN  . Tape Rash    Other reaction(s): UNKNOWN Adhesive Other reaction(s): UNKNOWN Adhesive    Metabolic Disorder Labs: No results found for: HGBA1C, MPG No results found for: PROLACTIN No results found for: CHOL, TRIG, HDL, CHOLHDL, VLDL, LDLCALC No results found for: TSH  Therapeutic Level Labs: No results found for: LITHIUM No results found for: VALPROATE No components found for:  CBMZ  Current Medications: Current Outpatient Medications  Medication Sig Dispense Refill  . acetaminophen (TYLENOL) 500 MG tablet Take 1 tablet (500 mg total) by mouth every 6 (six) hours as needed for moderate pain. Also give 500 mg three times a day for 7  Days. 60 tablet 0  . AMBULATORY NON FORMULARY MEDICATION Medication Name: incentive spirometry Use as directed 1 each 0  . CALCIUM PO Take by mouth.    . celecoxib (CELEBREX) 200 MG capsule TAKE 1 CAPSULE DAILY 90 capsule 1  . [facility] RANITIDINE 75 MG tablet Take 75 mg by mouth 2 (two) times daily as needed.   3  . dexlansoprazole (DEXILANT) 60 MG capsule Take 1 capsule (60 mg total) daily by mouth. 90 capsule 1  . dextromethorphan (DELSYM) 30 MG/5ML liquid Take by mouth 2 (two) times daily as needed for cough.    . diphenoxylate-atropine (LOMOTIL) 2.5-0.025 MG tablet Take by mouth 4 (four) times daily as needed for diarrhea or loose stools.    . fluticasone (FLONASE) 50 MCG/ACT nasal spray USE 2 SPRAYS NASALLY DAILY 48 g 2  . furosemide (LASIX) 40 MG tablet Take 1 tablet (40 mg total) daily by mouth. 90  tablet 3  . guaiFENesin (MUCINEX) 600 MG 12 hr tablet Take 400 mg by mouth 2 (two) times daily.     Marland Kitchen levocetirizine (XYZAL) 5 MG tablet TAKE 1 TABLET AT BEDTIME EVERY NIGHT 90 tablet 1  . levothyroxine (SYNTHROID, LEVOTHROID) 50 MCG tablet Take 1 tablet (50 mcg total) daily by mouth. 90 tablet 3  . lidocaine (LIDODERM) 5 % Place 1 patch onto the skin daily. Remove & Discard patch within 12 hours or as directed by MD 30 patch 5  . loperamide (IMODIUM) 2 MG capsule Take 2 mg by mouth as  needed for diarrhea or loose stools.    . magnesium gluconate (MAGONATE) 500 MG tablet Take 500 mg by mouth daily.    . meclizine (ANTIVERT) 12.5 MG tablet Take 12.5 mg by mouth every 4 (four) hours as needed.     . montelukast (SINGULAIR) 10 MG tablet TAKE 1 TABLET DAILY 90 tablet 3  . Multiple Vitamin (MULTIVITAMIN) tablet Take 1 tablet by mouth daily.    . OSCIMIN 0.125 MG tablet TAKE 1 TABLET TWICE A DAY 180 tablet 3  . Probiotic Product (PROBIOTIC COLON SUPPORT PO) Take 1 capsule by mouth daily.    . simvastatin (ZOCOR) 20 MG tablet Take 1 tablet (20 mg total) daily by mouth. 90 tablet 3  . SPIRIVA HANDIHALER 18 MCG inhalation capsule INHALE THE CONTENTS OF 1 CAPSULE DAILY 90 capsule 3  . spironolactone (ALDACTONE) 25 MG tablet Take 1 tablet (25 mg total) by mouth daily. 90 tablet 0  . tamsulosin (FLOMAX) 0.4 MG CAPS capsule TAKE 1 CAPSULE DAILY 90 capsule 1  . venlafaxine XR (EFFEXOR XR) 37.5 MG 24 hr capsule Take 1 capsule (37.5 mg total) by mouth daily. To be taken with 150 mg 90 capsule 0  . venlafaxine XR (EFFEXOR-XR) 150 MG 24 hr capsule Take 1 capsule (150 mg total) by mouth daily. To be taken with 37.5 mg 90 capsule 0  . ramelteon (ROZEREM) 8 MG tablet Take 1 tablet (8 mg total) by mouth at bedtime. 30 tablet 2   Current Facility-Administered Medications  Medication Dose Route Frequency Provider Last Rate Last Dose  . orphenadrine (NORFLEX) injection 60 mg  60 mg Intramuscular Once Mohammed Kindle, MD          Musculoskeletal: Strength & Muscle Tone: within normal limits Gait & Station: walks with a walker Patient leans: N/A  Psychiatric Specialty Exam: Review of Systems  Psychiatric/Behavioral: Positive for depression. The patient is nervous/anxious and has insomnia.   All other systems reviewed and are negative.   Blood pressure 123/68, pulse 97, temperature 97.6 F (36.4 C), temperature source Oral, weight 128 lb 12.8 oz (58.4 kg).Body mass index is 25.15 kg/m.  General Appearance: Casual  Eye Contact:  Fair  Speech:  Clear and Coherent  Volume:  Normal  Mood:  Dysphoric  Affect:  Appropriate  Thought Process:  Goal Directed and Descriptions of Associations: Intact  Orientation:  Full (Time, Place, and Person)  Thought Content: Logical   Suicidal Thoughts:  No  Homicidal Thoughts:  No  Memory:  Immediate;   Fair Recent;   Fair Remote;   Fair  Judgement:  Fair  Insight:  Fair  Psychomotor Activity:  Normal  Concentration:  Concentration: Fair and Attention Span: Fair  Recall:  [employer] of Knowledge: Fair  Language: Fair  Akathisia:  No  Handed:  Right  AIMS (if indicated): 0  Assets:  Communication Skills Desire for Improvement Financial Resources/Insurance Housing Social Support Transportation  ADL's:  Intact  Cognition: WNL  Sleep:  impaired   Screenings: PHQ2-9     Procedure visit from 09/04/2015 in [facility] Procedure visit from 10/03/2014 in [facility] Office Visit from 09/21/2014 in [facility]  PHQ-2 Total Score  0  0  1       Assessment and Plan: Luvena is an 84 year old Caucasian female who has a history of depression, COPD, hypothyroidism, hypertension, presented to the clinic today for a follow-up visit.  Patient today reports her depressive symptoms as fair however continues to report sleep issues.  She  denies any suicidality or perceptual disturbances.  She has good social support and currently lives in an assisted living facility.  Patient today agrees to a trial of a sleep aid.  Plan as noted below.   Plan  MDD Continue Effexor at 187.5 mg p.o. daily. Start Rozerem 8 mg p.o. nightly for sleep issues.  Follow-up in clinic in 2-3 months or sooner if needed.  Reviewed Medical [facility] in [facility] Per Dr. Einar Grad.  More than 50 % of the time was spent for psychoeducation and supportive psychotherapy and care coordination.  This note was generated in part or whole with voice recognition software. Voice recognition is usually quite accurate but there are transcription errors that can and very often do occur. I apologize for any typographical errors that were not detected and corrected.        Ursula Alert, MD 02/26/2017, 4:46 PM

## 2017-03-02 ENCOUNTER — Other Ambulatory Visit: Payer: Self-pay | Admitting: Family Medicine

## 2017-03-02 DIAGNOSIS — R600 Localized edema: Secondary | ICD-10-CM

## 2017-03-06 ENCOUNTER — Ambulatory Visit (INDEPENDENT_AMBULATORY_CARE_PROVIDER_SITE_OTHER): Payer: Medicare Other | Admitting: Family Medicine

## 2017-03-06 ENCOUNTER — Encounter: Payer: Self-pay | Admitting: Family Medicine

## 2017-03-06 ENCOUNTER — Other Ambulatory Visit: Payer: Self-pay | Admitting: Family Medicine

## 2017-03-06 VITALS — BP 132/68 | HR 95 | Temp 98.3°F | Resp 24 | Wt 128.0 lb

## 2017-03-06 DIAGNOSIS — J441 Chronic obstructive pulmonary disease with (acute) exacerbation: Secondary | ICD-10-CM | POA: Diagnosis not present

## 2017-03-06 MED ORDER — PREDNISONE 20 MG PO TABS: ORAL_TABLET | ORAL | 0 refills | Status: DC

## 2017-03-06 MED ORDER — AZITHROMYCIN 250 MG PO TABS: ORAL_TABLET | ORAL | 0 refills | Status: DC

## 2017-03-06 MED ORDER — ALBUTEROL SULFATE HFA 108 (90 BASE) MCG/ACT IN AERS: 2.0000 | INHALATION_SPRAY | Freq: Four times a day (QID) | RESPIRATORY_TRACT | 2 refills | Status: DC | PRN

## 2017-03-06 NOTE — Patient Instructions (Signed)
Start prednisone if breathing not improving with antibiotic and inhaler.

## 2017-03-06 NOTE — Progress Notes (Signed)
Subjective:     Patient ID: Sandra Brown, female   DOB: 29-Mar-1926, 84 y.o.   MRN: [id number] Chief Complaint  Patient presents with  . Wheezing    Patient reports that her wheezing has gotten worse over the last 10 days. She reports that she does have history of COPD and currently takes [place], but she feels she may have a sinus infection due to the excessive mucus in her head and in her chest.    HPI Reports green, thick sputum, mild sinus congestion and increased s.o.b.with exertion. No fever. Reports compliance with Spiriva but does not have rescue inhaler.  Review of Systems     Objective:   Physical Exam  Constitutional: She appears well-developed and well-nourished. No distress.  Ears: T.M's intact without inflammation Throat: no tonsillar enlargement or exudate Neck: no cervical adenopathy Lungs: Posterior fields with inspiratory and expiratory wheezes.     Assessment:    1. COPD exacerbation (HCC) rx for Zithromax and albuterol. - predniSONE (DELTASONE) 20 MG tablet; One pill twice daily for 5 days  Dispense: 10 tablet; Refill: 0    Plan:   Start prednisone if breathing not improving with abx and inhaler (demonstration provided)

## 2017-03-07 ENCOUNTER — Telehealth: Payer: Self-pay

## 2017-03-07 ENCOUNTER — Other Ambulatory Visit: Payer: Self-pay | Admitting: Family Medicine

## 2017-03-07 DIAGNOSIS — J309 Allergic rhinitis, unspecified: Secondary | ICD-10-CM

## 2017-03-07 MED ORDER — FLUTICASONE PROPIONATE 50 MCG/ACT NA SUSP
NASAL | 2 refills | Status: DC
Start: 2017-03-07 — End: 2018-01-05

## 2017-03-07 NOTE — Telephone Encounter (Signed)
Flonase refill sent in.

## 2017-03-07 NOTE — Telephone Encounter (Signed)
i went ahead and did prior auth to see if they would cover.  pending review.

## 2017-03-07 NOTE — Telephone Encounter (Signed)
received fax that prior auth was needed for pt to get the rozerem.

## 2017-03-07 NOTE — Telephone Encounter (Signed)
[employer] had called from [facility] in regards to patient she states that patient is actively taking Flonase to treat for congestion and allergies, patient is on her last bottle and is requesting refill. [name] is asking if physician wants her to continue using Flonase? If so she is asking for [facility]. KW

## 2017-03-07 NOTE — Telephone Encounter (Signed)
Ok, thanks.

## 2017-03-07 NOTE — Telephone Encounter (Signed)
went online to do the prior auth for the rozerem. a message popped up states that the rozerem has been rejected by insurance non-preferred agents have higer patient copya. perferred medications were: zolpidem tartrate , zolpidem er, eszopiclone

## 2017-03-10 ENCOUNTER — Telehealth: Payer: Self-pay | Admitting: Psychiatry

## 2017-03-10 NOTE — Telephone Encounter (Signed)
Attempted to contact pt several times, no answer. Spoke to [place] - daughter , she asked writer to call her direct # which is [phone]. Tried again and left message to call back.

## 2017-03-10 NOTE — Telephone Encounter (Signed)
Patient was not interested in any sleep aid that day except Rozerem. Pharmacy is asking replace with ambien. Wanted to discuss sleep aids with patient , called several numbers in her chart , no answer. It will be better to discuss with patient prior to sending in a new script since she is elderly and has tried and failed meds in the past.

## 2017-03-14 NOTE — Telephone Encounter (Signed)
thanks

## 2017-03-14 NOTE — Telephone Encounter (Signed)
approved until 01-06-2098.

## 2017-03-21 ENCOUNTER — Other Ambulatory Visit: Payer: Self-pay

## 2017-03-21 ENCOUNTER — Encounter: Payer: Self-pay | Admitting: Psychiatry

## 2017-03-21 ENCOUNTER — Ambulatory Visit (INDEPENDENT_AMBULATORY_CARE_PROVIDER_SITE_OTHER): Payer: Medicare Other | Admitting: Psychiatry

## 2017-03-21 VITALS — BP 132/71 | HR 106 | Temp 98.6°F | Wt 127.4 lb

## 2017-03-21 DIAGNOSIS — F3341 Major depressive disorder, recurrent, in partial remission: Secondary | ICD-10-CM | POA: Diagnosis not present

## 2017-03-21 NOTE — Progress Notes (Signed)
[facility] MD OP Progress Note  03/21/2017 12:51 PM Sandra Brown  MRN:  [id number]  Chief Complaint: ' I am fine." Chief Complaint    Follow-up; Medication Refill     HPI: Sandra Brown is an 84 year old CF , widowed , lives at [facility] , sent to the clinic today for a follow-up visit.  Patient today presented along with her daughter Sandra Brown.  Patient has a history of depression and sleep problems.  She also has multiple medical problems including COPD, GERD, hypothyroidism.  She used to follow-up with Dr. Einar Grad and was transitioned to Probation officer.  This is her second appointment with Probation officer.  She today reports her depressive symptoms as stable.  She reports she is currently tolerating the Effexor well.  According to daughter she is at a much better place now than she used to in the past.  Per daughter when she used to live independent she went into severe depressive and psychotic spells.  Patient at that time was also on pain management and was on narcotic pain medications.  She was able to get off the pain medications and currently has ALF and hence is doing well.  Patient continues to struggle with sleep.  She goes to bed at 2 AM and wakes up around 9 or 10 AM in the morning.  She reports that she also feels drowsy or sleepy during the daytime and has to take naps.  During her last visit Rozerem was discussed with her.  She had some trouble filling the prescription due to her insurance.  She however reports she got a letter saying that it has been approved.  Discussed with her that she can continue the Rozerem as prescribed and let writer know if she has any concerns.  An Epworth sleep scale was done today and she scored 7.  At this time there is no need for a sleep study.  Patient reports she looks forward to her children coming to visit her from [place]. Visit Diagnosis:    ICD-10-CM   1. MDD (major depressive disorder), recurrent, in partial remission ([place]) F33.41     Past Psychiatric History:  Hx of depression since the past several years.  She reports her depression is initially started after she lost her dogs.  She used to follow up with Dr. Einar Grad here in clinic in the past.  Past Medical History:  Past Medical History:  Diagnosis Date  . Cataract   . COPD (chronic obstructive pulmonary disease) ([place])   . Depression   . Difficulty swallowing   . Frequent headaches   . Hearing loss   . Hypertension   . Hypothyroidism   . Reflux     Past Surgical History:  Procedure Laterality Date  . ABDOMINAL HYSTERECTOMY    . APPENDECTOMY    . LUMBAR LAMINECTOMY    . PARATHYROIDECTOMY    . TOTAL HIP ARTHROPLASTY     x 4    Family Psychiatric History: Denies any significant history of depression or anxiety in her family.  Family History:  Family History  Problem Relation Age of Onset  . Stroke Mother   . Hypertension Mother   . Heart disease Father   . Hypertension Father    Substance abuse history: Denies  Social History:Currently lives at an assisted living facility, twin [place].  She is widowed.  She reports she has 3 children.  She has 2 daughters and 1 son.  One of her daughters live in [place] and is very  supportive.  Her husband used to be in the [employer].  He passed away. Social History   Socioeconomic History  . Marital status: Widowed    Spouse name: None  . Number of children: None  . Years of education: None  . Highest education level: None  Social Needs  . Financial resource strain: None  . Food insecurity - worry: None  . Food insecurity - inability: None  . Transportation needs - medical: None  . Transportation needs - non-medical: None  Occupational History  . None  Tobacco Use  . Smoking status: Never Smoker  . Smokeless tobacco: Never Used  . Tobacco comment: quit 1954  Substance and Sexual Activity  . Alcohol use: No  . Drug use: No  . Sexual activity: No  Other Topics Concern  . None  Social History Narrative  . None    Allergies:  Allergies   Allergen Reactions  . Sulfa Antibiotics Rash and Itching    Other reaction(s): Diarrhea and vomiting (finding)  . Erythromycin Nausea And Vomiting    Other reaction(s): Diarrhea and vomiting (finding)  . Aspirin Other (See Comments), Tinitus and Nausea And Vomiting    Ringing of the ears, caused hearing loss both ears Ringing in ears  . Contrast Media [Iodinated Diagnostic Agents] Rash and Hives  . Latex Rash and Itching  . Penicillin G Rash  . Penicillins Rash    Other reaction(s): UNKNOWN  . Tape Rash    Other reaction(s): UNKNOWN Adhesive Other reaction(s): UNKNOWN Adhesive    Metabolic Disorder Labs: No results found for: HGBA1C, MPG No results found for: PROLACTIN No results found for: CHOL, TRIG, HDL, CHOLHDL, VLDL, LDLCALC No results found for: TSH  Therapeutic Level Labs: No results found for: LITHIUM No results found for: VALPROATE No components found for:  CBMZ  Current Medications: Current Outpatient Medications  Medication Sig Dispense Refill  . acetaminophen (TYLENOL) 500 MG tablet Take 1 tablet (500 mg total) by mouth every 6 (six) hours as needed for moderate pain. Also give 500 mg three times a day for 7  Days. 60 tablet 0  . albuterol (PROVENTIL HFA;VENTOLIN HFA) 108 (90 Base) MCG/ACT inhaler Inhale 2 puffs into the lungs every 6 (six) hours as needed for wheezing or shortness of breath. 1 Inhaler 2  . AMBULATORY NON FORMULARY MEDICATION Medication Name: incentive spirometry Use as directed 1 each 0  . azithromycin (ZITHROMAX Z-PAK) 250 MG tablet 2 pills today then one pill daily 6 each 0  . CALCIUM PO Take by mouth.    . celecoxib (CELEBREX) 200 MG capsule TAKE 1 CAPSULE DAILY 90 capsule 1  . [facility] RANITIDINE 75 MG tablet Take 75 mg by mouth 2 (two) times daily as needed.   3  . dexlansoprazole (DEXILANT) 60 MG capsule Take 1 capsule (60 mg total) daily by mouth. 90 capsule 1  . dextromethorphan (DELSYM) 30 MG/5ML liquid Take by mouth 2 (two) times daily  as needed for cough.    . diphenoxylate-atropine (LOMOTIL) 2.5-0.025 MG tablet Take by mouth 4 (four) times daily as needed for diarrhea or loose stools.    . fluticasone (FLONASE) 50 MCG/ACT nasal spray USE 2 SPRAYS NASALLY DAILY 48 g 2  . furosemide (LASIX) 40 MG tablet Take 1 tablet (40 mg total) daily by mouth. 90 tablet 3  . guaiFENesin (MUCINEX) 600 MG 12 hr tablet Take 400 mg by mouth 2 (two) times daily.     Marland Kitchen levocetirizine (XYZAL) 5 MG tablet TAKE 1  TABLET AT BEDTIME EVERY NIGHT 90 tablet 1  . levothyroxine (SYNTHROID, LEVOTHROID) 50 MCG tablet Take 1 tablet (50 mcg total) daily by mouth. 90 tablet 3  . lidocaine (LIDODERM) 5 % Place 1 patch onto the skin daily. Remove & Discard patch within 12 hours or as directed by MD 30 patch 5  . loperamide (IMODIUM) 2 MG capsule Take 2 mg by mouth as needed for diarrhea or loose stools.    . magnesium gluconate (MAGONATE) 500 MG tablet Take 500 mg by mouth daily.    . meclizine (ANTIVERT) 12.5 MG tablet Take 12.5 mg by mouth every 4 (four) hours as needed.     . Multiple Vitamin (MULTIVITAMIN) tablet Take 1 tablet by mouth daily.    . OSCIMIN 0.125 MG tablet TAKE 1 TABLET TWICE A DAY 180 tablet 3  . predniSONE (DELTASONE) 20 MG tablet One pill twice daily for 5 days 10 tablet 0  . Probiotic Product (PROBIOTIC COLON SUPPORT PO) Take 1 capsule by mouth daily.    . ramelteon (ROZEREM) 8 MG tablet Take 1 tablet (8 mg total) by mouth at bedtime. 30 tablet 2  . simvastatin (ZOCOR) 20 MG tablet Take 1 tablet (20 mg total) daily by mouth. 90 tablet 3  . SPIRIVA HANDIHALER 18 MCG inhalation capsule INHALE THE CONTENTS OF 1 CAPSULE DAILY 90 capsule 3  . spironolactone (ALDACTONE) 25 MG tablet TAKE 1 TABLET DAILY 90 tablet 0  . tamsulosin (FLOMAX) 0.4 MG CAPS capsule TAKE 1 CAPSULE DAILY 90 capsule 1  . venlafaxine XR (EFFEXOR XR) 37.5 MG 24 hr capsule Take 1 capsule (37.5 mg total) by mouth daily. To be taken with 150 mg 90 capsule 0  . venlafaxine XR  (EFFEXOR-XR) 150 MG 24 hr capsule Take 1 capsule (150 mg total) by mouth daily. To be taken with 37.5 mg 90 capsule 0   Current Facility-Administered Medications  Medication Dose Route Frequency Provider Last Rate Last Dose  . orphenadrine (NORFLEX) injection 60 mg  60 mg Intramuscular Once Mohammed Kindle, MD         Musculoskeletal: Strength & Muscle Tone: within normal limits Gait & Station: normal Patient leans: N/A  Psychiatric Specialty Exam: Review of Systems  Psychiatric/Behavioral: The patient has insomnia.   All other systems reviewed and are negative.   Blood pressure 132/71, pulse (!) 106, temperature 98.6 F (37 C), weight 127 lb 6.4 oz (57.8 kg).Body mass index is 24.88 kg/m.  General Appearance: Casual  Eye Contact:  Fair  Speech:  Clear and Coherent  Volume:  Normal  Mood:  Euthymic  Affect:  Congruent  Thought Process:  Goal Directed and Descriptions of Associations: Intact  Orientation:  Full (Time, Place, and Person)  Thought Content: Logical   Suicidal Thoughts:  No  Homicidal Thoughts:  No  Memory:  Immediate;   Fair Recent;   Fair Remote;   Fair  Judgement:  Fair  Insight:  Fair  Psychomotor Activity:  Normal  Concentration:  Concentration: Fair and Attention Span: Fair  Recall:  [employer] of Knowledge: Fair  Language: Fair  Akathisia:  No  Handed:  Right  AIMS (if indicated): na  Assets:  Communication Skills Desire for Improvement Financial Resources/Insurance Housing Resilience Social Support Transportation Vocational/Educational  ADL's:  Intact  Cognition: WNL  Sleep:  delayed    Screenings: Epworth sleep scale PHQ2-9     Procedure visit from 09/04/2015 in [facility] Procedure visit from 10/03/2014 in [facility] Office Visit from 09/21/2014 in [facility]  PHQ-2 Total Score  0  0  1        Assessment and Plan: Gerene is an 84 year old Caucasian female who has a history of depression, COPD, hypothyroidism, hypertension, presented to the clinic today for a follow-up visit along with her daughter Sandra Brown.  Patient currently lives in an ALF.  Patient had some delayed onset sleep problems.  She was started on Rozerem last visit however was unable to fill it.  Patient otherwise denies any concerns at this time.  She continues to have good social support and her depressive symptoms are currently stable.  Plan as noted below.   MDD Continue Effexor at 187.5 mg p.o. daily Rozerem 8 mg p.o. nightly for sleep issues.  Janett Billow CMA called the pharmacy and verified that they have the prescription ready for her. Epworth sleep scale-7 She does not need a sleep study at this time.  We will continue to monitor.  Follow-up in clinic in 2-3 months or sooner if needed.  More than 50 % of the time was spent for psychoeducation and supportive psychotherapy and care coordination.  This note was generated in part or whole with voice recognition software. Voice recognition is usually quite accurate but there are transcription errors that can and very often do occur. I apologize for any typographical errors that were not detected and corrected.     Ursula Alert, MD 03/21/2017, 12:51 PM

## 2017-03-29 ENCOUNTER — Other Ambulatory Visit: Payer: Self-pay | Admitting: Family Medicine

## 2017-03-29 DIAGNOSIS — J309 Allergic rhinitis, unspecified: Secondary | ICD-10-CM

## 2017-03-31 NOTE — Telephone Encounter (Signed)
[name], I think this is your patient Thanks

## 2017-04-11 ENCOUNTER — Other Ambulatory Visit: Payer: Self-pay | Admitting: Family Medicine

## 2017-04-11 DIAGNOSIS — M5136 Other intervertebral disc degeneration, lumbar region: Secondary | ICD-10-CM

## 2017-04-17 ENCOUNTER — Telehealth: Payer: Self-pay

## 2017-04-17 NOTE — Telephone Encounter (Signed)
nurse at [facility] called states that the pt states she not going to take any more of the rozerem because it doesnt help,  do you want to try something else?

## 2017-04-17 NOTE — Telephone Encounter (Signed)
No will not recommend anything else , since she wanted something to help her sleep wake cycle and not sleep in general.

## 2017-04-23 ENCOUNTER — Telehealth: Payer: Self-pay

## 2017-04-23 NOTE — Telephone Encounter (Signed)
Director at [facility]) called to inform physician that patient had scratched her shin on rocking chair and had a small skin tear that she describes as a scratch the size of a finger nail. She states that they noticed that patient has a small redline under skin tear and that it feels slightly warm to the touch. [name] is requesting a antibiotic be faxed in to [facility] to treat, she states there fax number is [phone]. KW

## 2017-04-24 ENCOUNTER — Other Ambulatory Visit: Payer: Self-pay | Admitting: Family Medicine

## 2017-04-24 NOTE — Telephone Encounter (Signed)
Do they just need the order for the abx for the actual prescription? I don't think they have their own pharmacy.

## 2017-04-24 NOTE — Telephone Encounter (Signed)
Order has been faxed to [facility]. KW

## 2017-04-30 ENCOUNTER — Other Ambulatory Visit: Payer: Self-pay | Admitting: Family Medicine

## 2017-05-02 ENCOUNTER — Ambulatory Visit (INDEPENDENT_AMBULATORY_CARE_PROVIDER_SITE_OTHER): Payer: Medicare Other | Admitting: Podiatry

## 2017-05-02 ENCOUNTER — Encounter: Payer: Self-pay | Admitting: Podiatry

## 2017-05-02 DIAGNOSIS — M79676 Pain in unspecified toe(s): Secondary | ICD-10-CM | POA: Diagnosis not present

## 2017-05-02 DIAGNOSIS — B351 Tinea unguium: Secondary | ICD-10-CM

## 2017-05-05 NOTE — Progress Notes (Signed)
   SUBJECTIVE Patient presents to office today complaining of elongated, thickened nails that cause pain while ambulating in shoes. She is unable to trim her own nails. Patient is here for further evaluation and treatment.  Past Medical History:  Diagnosis Date  . Cataract   . COPD (chronic obstructive pulmonary disease) ([facility])   . Depression   . Difficulty swallowing   . Frequent headaches   . Hearing loss   . Hypertension   . Hypothyroidism   . Reflux     OBJECTIVE General Patient is awake, alert, and oriented x 3 and in no acute distress. Derm Skin is dry and supple bilateral. Negative open lesions or macerations. Remaining integument unremarkable. Nails are tender, long, thickened and dystrophic with subungual debris, consistent with onychomycosis, 1-5 bilateral. No signs of infection noted. Vasc  DP and PT pedal pulses palpable bilaterally. Temperature gradient within normal limits.  Neuro Epicritic and protective threshold sensation grossly intact bilaterally.  Musculoskeletal Exam No symptomatic pedal deformities noted bilateral. Muscular strength within normal limits.  ASSESSMENT 1. Onychodystrophic nails 1-5 bilateral with hyperkeratosis of nails.  2. Onychomycosis of nail due to dermatophyte bilateral 3. Pain in foot bilateral  PLAN OF CARE 1. Patient evaluated today.  2. Instructed to maintain good pedal hygiene and foot care.  3. Mechanical debridement of nails 1-5 bilaterally performed using a nail nipper. Filed with dremel without incident.  4. Return to clinic in 3 mos.    Briona Korpela M. Zhuri Krass, DPM [facility]  Dr. Trygve Thal M. Darlinda Bellows, DPM    [address]                                        [place], [place] [address]                Office [phone]  Fax [phone]     

## 2017-05-09 DIAGNOSIS — D485 Neoplasm of uncertain behavior of skin: Secondary | ICD-10-CM | POA: Diagnosis not present

## 2017-05-09 DIAGNOSIS — L821 Other seborrheic keratosis: Secondary | ICD-10-CM | POA: Diagnosis not present

## 2017-05-09 DIAGNOSIS — L858 Other specified epidermal thickening: Secondary | ICD-10-CM | POA: Diagnosis not present

## 2017-05-31 ENCOUNTER — Other Ambulatory Visit: Payer: Self-pay | Admitting: Family Medicine

## 2017-05-31 DIAGNOSIS — R600 Localized edema: Secondary | ICD-10-CM

## 2017-06-06 DIAGNOSIS — C44629 Squamous cell carcinoma of skin of left upper limb, including shoulder: Secondary | ICD-10-CM | POA: Diagnosis not present

## 2017-06-06 DIAGNOSIS — B078 Other viral warts: Secondary | ICD-10-CM | POA: Diagnosis not present

## 2017-06-27 ENCOUNTER — Ambulatory Visit (INDEPENDENT_AMBULATORY_CARE_PROVIDER_SITE_OTHER): Payer: Medicare Other | Admitting: Psychiatry

## 2017-06-27 ENCOUNTER — Encounter: Payer: Self-pay | Admitting: Psychiatry

## 2017-06-27 VITALS — BP 129/79 | HR 111 | Ht 60.0 in | Wt 125.0 lb

## 2017-06-27 DIAGNOSIS — F3341 Major depressive disorder, recurrent, in partial remission: Secondary | ICD-10-CM

## 2017-06-27 DIAGNOSIS — F5105 Insomnia due to other mental disorder: Secondary | ICD-10-CM | POA: Diagnosis not present

## 2017-06-27 MED ORDER — VENLAFAXINE HCL ER 150 MG PO CP24: 150.0000 mg | ORAL_CAPSULE | Freq: Every day | ORAL | 0 refills | Status: DC

## 2017-06-27 MED ORDER — VENLAFAXINE HCL ER 37.5 MG PO CP24: 37.5000 mg | ORAL_CAPSULE | Freq: Every day | ORAL | 0 refills | Status: DC

## 2017-06-27 MED ORDER — TRAZODONE HCL 50 MG PO TABS: 25.0000 mg | ORAL_TABLET | Freq: Every day | ORAL | 0 refills | Status: DC

## 2017-06-27 NOTE — Progress Notes (Signed)
[place] MD  OP Progress Note  06/27/2017 12:58 PM Sandra Brown  MRN:  [id number]  Chief Complaint: ' I am here for follow up." Chief Complaint    Follow-up     HPI: Sandra Brown is an 84 year old Caucasian female, widowed, lives at [facility], presented to the clinic today for a follow-up visit.  Patient today presented alone.  Patient has a history of depression, sleep problems as well as multiple medical problems including COPD, GERD, hypothyroidism.  Patient today reports she continues to struggle with sleep problems.  She reports the Rozerem could not be filled due to insurance problems.  She reports she has noticed that her sleep issues has been worsening.  She reports she is interested in a medication.  Discussed trazodone.  She agrees with it.  Patient reports she is otherwise compliant on her Effexor.  She reports her mood is stable.  She reports she had a big family reunion.   She reports her grandchild got married and all her family got together.  She reports she enjoyed it.  Patient denies any suicidality.  Patient reports appetite is fair.  Patient denies any other concerns today.   Visit Diagnosis:    ICD-10-CM   1. MDD (major depressive disorder), recurrent, in partial remission (HCC) F33.41 venlafaxine XR (EFFEXOR XR) 37.5 MG 24 hr capsule    venlafaxine XR (EFFEXOR-XR) 150 MG 24 hr capsule  2. Insomnia due to mental condition F51.05     Past Psychiatric History: I have reviewed past psychiatric history from my progress note on 03/21/2017.  Past Medical History:  Past Medical History:  Diagnosis Date  . Cataract   . COPD (chronic obstructive pulmonary disease) ([place])   . Depression   . Difficulty swallowing   . Frequent headaches   . Hearing loss   . Hypertension   . Hypothyroidism   . Reflux     Past Surgical History:  Procedure Laterality Date  . ABDOMINAL HYSTERECTOMY    . APPENDECTOMY    . LUMBAR LAMINECTOMY    . PARATHYROIDECTOMY    . TOTAL HIP  ARTHROPLASTY     x 4    Family Psychiatric History: Reviewed family psychiatric history from my progress note on 03/21/2017.  Family History:  Family History  Problem Relation Age of Onset  . Stroke Mother   . Hypertension Mother   . Heart disease Father   . Hypertension Father    Substance abuse history: Denies  Social History: Reviewed social history from my progress note on 03/21/2017. Social History   Socioeconomic History  . Marital status: Widowed    Spouse name: Not on file  . Number of children: Not on file  . Years of education: Not on file  . Highest education level: Not on file  Occupational History  . Not on file  Social Needs  . Financial resource strain: Not on file  . Food insecurity:    Worry: Not on file    Inability: Not on file  . Transportation needs:    Medical: Not on file    Non-medical: Not on file  Tobacco Use  . Smoking status: Never Smoker  . Smokeless tobacco: Never Used  . Tobacco comment: quit 1954  Substance and Sexual Activity  . Alcohol use: No  . Drug use: No  . Sexual activity: Never  Lifestyle  . Physical activity:    Days per week: Not on file    Minutes per session: Not on file  .  Stress: Not on file  Relationships  . Social connections:    Talks on phone: Not on file    Gets together: Not on file    Attends religious service: Not on file    Active member of club or organization: Not on file    Attends meetings of clubs or organizations: Not on file    Relationship status: Not on file  Other Topics Concern  . Not on file  Social History Narrative  . Not on file    Allergies:  Allergies  Allergen Reactions  . Sulfa Antibiotics Rash and Itching    Other reaction(s): Diarrhea and vomiting (finding)  . Erythromycin Nausea And Vomiting    Other reaction(s): Diarrhea and vomiting (finding)  . Aspirin Other (See Comments), Tinitus and Nausea And Vomiting    Ringing of the ears, caused hearing loss both ears Ringing  in ears  . Contrast Media [Iodinated Diagnostic Agents] Rash and Hives  . Latex Rash and Itching  . Penicillin G Rash  . Penicillins Rash    Other reaction(s): UNKNOWN  . Tape Rash    Other reaction(s): UNKNOWN Adhesive Other reaction(s): UNKNOWN Adhesive    Metabolic Disorder Labs: No results found for: HGBA1C, MPG No results found for: PROLACTIN No results found for: CHOL, TRIG, HDL, CHOLHDL, VLDL, LDLCALC No results found for: TSH  Therapeutic Level Labs: No results found for: LITHIUM No results found for: VALPROATE No components found for:  CBMZ  Current Medications: Current Outpatient Medications  Medication Sig Dispense Refill  . acetaminophen (TYLENOL) 500 MG tablet Take 1 tablet (500 mg total) by mouth every 6 (six) hours as needed for moderate pain. Also give 500 mg three times a day for 7  Days. 60 tablet 0  . albuterol (PROVENTIL HFA;VENTOLIN HFA) 108 (90 Base) MCG/ACT inhaler Inhale 2 puffs into the lungs every 6 (six) hours as needed for wheezing or shortness of breath. 1 Inhaler 2  . AMBULATORY NON FORMULARY MEDICATION Medication Name: incentive spirometry Use as directed 1 each 0  . azithromycin (ZITHROMAX Z-PAK) 250 MG tablet 2 pills today then one pill daily 6 each 0  . CALCIUM PO Take by mouth.    . celecoxib (CELEBREX) 200 MG capsule TAKE 1 CAPSULE DAILY 90 capsule 1  . [facility] RANITIDINE 75 MG tablet Take 75 mg by mouth 2 (two) times daily as needed.   3  . DEXILANT 60 MG capsule TAKE 1 CAPSULE DAILY 90 capsule 1  . dextromethorphan (DELSYM) 30 MG/5ML liquid Take by mouth 2 (two) times daily as needed for cough.    . esomeprazole (NEXIUM) 40 MG capsule Take by mouth.    . fluticasone (FLONASE) 50 MCG/ACT nasal spray USE 2 SPRAYS NASALLY DAILY 48 g 2  . furosemide (LASIX) 40 MG tablet Take 1 tablet (40 mg total) daily by mouth. 90 tablet 3  . guaiFENesin (MUCINEX) 600 MG 12 hr tablet Take 400 mg by mouth 2 (two) times daily.     Marland Kitchen lamoTRIgine (LAMICTAL) 150 MG  tablet Take by mouth.    . levocetirizine (XYZAL) 5 MG tablet TAKE 1 TABLET AT BEDTIME EVERY NIGHT 90 tablet 1  . levothyroxine (SYNTHROID, LEVOTHROID) 50 MCG tablet Take 1 tablet (50 mcg total) daily by mouth. 90 tablet 3  . lidocaine (LIDODERM) 5 % Place 1 patch onto the skin daily. Remove & Discard patch within 12 hours or as directed by MD 30 patch 5  . loperamide (IMODIUM) 2 MG capsule Take 2 mg  by mouth as needed for diarrhea or loose stools.    . magnesium gluconate (MAGONATE) 500 MG tablet Take 500 mg by mouth daily.    . meclizine (ANTIVERT) 12.5 MG tablet Take 12.5 mg by mouth every 4 (four) hours as needed.     . montelukast (SINGULAIR) 10 MG tablet     . Multiple Vitamin (MULTIVITAMIN) tablet Take 1 tablet by mouth daily.    . OSCIMIN 0.125 MG tablet TAKE 1 TABLET TWICE A DAY 180 tablet 3  . predniSONE (DELTASONE) 20 MG tablet One pill twice daily for 5 days 10 tablet 0  . Probiotic Product (PROBIOTIC COLON SUPPORT PO) Take 1 capsule by mouth daily.    . ramelteon (ROZEREM) 8 MG tablet Take 1 tablet (8 mg total) by mouth at bedtime. 30 tablet 2  . simvastatin (ZOCOR) 20 MG tablet Take 1 tablet (20 mg total) daily by mouth. 90 tablet 3  . simvastatin (ZOCOR) 40 MG tablet Take by mouth.    . SPIRIVA HANDIHALER 18 MCG inhalation capsule INHALE THE CONTENTS OF 1 CAPSULE DAILY 90 capsule 3  . spironolactone (ALDACTONE) 25 MG tablet TAKE 1 TABLET DAILY 90 tablet 1  . tamsulosin (FLOMAX) 0.4 MG CAPS capsule TAKE 1 CAPSULE DAILY 90 capsule 1  . tamsulosin (FLOMAX) 0.4 MG CAPS capsule Take by mouth.    . triamcinolone cream (KENALOG) 0.1 % APP BID UNTIL CLEAR/FLAT  0  . venlafaxine XR (EFFEXOR XR) 37.5 MG 24 hr capsule Take 1 capsule (37.5 mg total) by mouth daily. To be taken with 150 mg 90 capsule 0  . venlafaxine XR (EFFEXOR-XR) 150 MG 24 hr capsule Take 1 capsule (150 mg total) by mouth daily. To be taken with 37.5 mg 90 capsule 0  . traZODone (DESYREL) 50 MG tablet Take 0.5-1 tablets  (25-50 mg total) by mouth at bedtime. For sleep 90 tablet 0   Current Facility-Administered Medications  Medication Dose Route Frequency Provider Last Rate Last Dose  . orphenadrine (NORFLEX) injection 60 mg  60 mg Intramuscular Once Mohammed Kindle, MD         Musculoskeletal: Strength & Muscle Tone: within normal limits Gait & Station: walks with walker Patient leans: N/A  Psychiatric Specialty Exam: Review of Systems  Psychiatric/Behavioral: The patient is nervous/anxious and has insomnia.   All other systems reviewed and are negative.   Blood pressure 129/79, pulse (!) 111, height 5' (1.524 m), weight 125 lb (56.7 kg), SpO2 93 %.Body mass index is 24.41 kg/m.  General Appearance: Casual  Eye Contact:  Fair  Speech:  Normal Rate  Volume:  Normal  Mood:  Anxious  Affect:  Congruent  Thought Process:  Goal Directed and Descriptions of Associations: Intact  Orientation:  Full (Time, Place, and Person)  Thought Content: Logical   Suicidal Thoughts:  No  Homicidal Thoughts:  No  Memory:  Immediate;   Fair Recent;   Fair Remote;   Fair  Judgement:  Fair  Insight:  Fair  Psychomotor Activity:  Normal  Concentration:  Concentration: Fair and Attention Span: Fair  Recall:  [employer] of Knowledge: Fair  Language: Fair  Akathisia:  No  Handed:  Right  AIMS (if indicated): na  Assets:  Communication Skills Desire for Improvement Housing Social Support  ADL's:  Intact  Cognition: WNL  Sleep:  Poor   Screenings: PHQ2-9     Procedure visit from 09/04/2015 in [place] Procedure visit from 10/03/2014 in [place]  [place] Office Visit from 09/21/2014 in [place]  PHQ-2 Total Score  0  0  1       Assessment and Plan: Sandra Brown is an 84 year old Caucasian female who has a history of depression, COPD, hypothyroidism, hypertension, presented to the  clinic today for a follow-up visit.  Patient today reports she continues to struggle with sleep issues.  Patient denies any other concerns.  She continues to have good social support.  Plan as noted below.  Plan MDD Continue Effexor 187.5 mg p.o. Daily.  For insomnia Start trazodone 25-50 mg p.o. nightly as needed Epworth sleep scale done in the past-she scored 7, on 03/21/2017.  Follow up in clinic in 2-3 months or sooner if needed.  More than 50 % of the time was spent for psychoeducation and supportive psychotherapy and care coordination.  This note was generated in part or whole with voice recognition software. Voice recognition is usually quite accurate but there are transcription errors that can and very often do occur. I apologize for any typographical errors that were not detected and corrected.       Ursula Alert, MD 06/27/2017, 12:58 PM

## 2017-06-27 NOTE — Patient Instructions (Signed)
Trazodone tablets What is this medicine? TRAZODONE (TRAZ oh done) is used to treat depression. This medicine may be used for other purposes; ask your health care provider or pharmacist if you have questions. COMMON BRAND NAME(S): Desyrel What should I tell my health care provider before I take this medicine? They need to know if you have any of these conditions: -attempted suicide or thinking about it -bipolar disorder -bleeding problems -glaucoma -heart disease, or previous heart attack -irregular heart beat -kidney or liver disease -low levels of sodium in the blood -an unusual or allergic reaction to trazodone, other medicines, foods, dyes or preservatives -pregnant or trying to get pregnant -breast-feeding How should I use this medicine? Take this medicine by mouth with a glass of water. Follow the directions on the prescription label. Take this medicine shortly after a meal or a light snack. Take your medicine at regular intervals. Do not take your medicine more often than directed. Do not stop taking this medicine suddenly except upon the advice of your doctor. Stopping this medicine too quickly may cause serious side effects or your condition may worsen. A special MedGuide will be given to you by the pharmacist with each prescription and refill. Be sure to read this information carefully each time. Talk to your pediatrician regarding the use of this medicine in children. Special care may be needed. Overdosage: If you think you have taken too much of this medicine contact a poison control center or emergency room at once. NOTE: This medicine is only for you. Do not share this medicine with others. What if I miss a dose? If you miss a dose, take it as soon as you can. If it is almost time for your next dose, take only that dose. Do not take double or extra doses. What may interact with this medicine? Do not take this medicine with any of the following medications: -certain medicines  for fungal infections like fluconazole, itraconazole, ketoconazole, posaconazole, voriconazole -cisapride -dofetilide -dronedarone -linezolid -MAOIs like Carbex, Eldepryl, Marplan, Nardil, and Parnate -mesoridazine -methylene blue (injected into a vein) -pimozide -saquinavir -thioridazine -ziprasidone This medicine may also interact with the following medications: -alcohol -antiviral medicines for HIV or AIDS -aspirin and aspirin-like medicines -barbiturates like phenobarbital -certain medicines for blood pressure, heart disease, irregular heart beat -certain medicines for depression, anxiety, or psychotic disturbances -certain medicines for migraine headache like almotriptan, eletriptan, frovatriptan, naratriptan, rizatriptan, sumatriptan, zolmitriptan -certain medicines for seizures like carbamazepine and phenytoin -certain medicines for sleep -certain medicines that treat or prevent blood clots like dalteparin, enoxaparin, warfarin -digoxin -fentanyl -lithium -NSAIDS, medicines for pain and inflammation, like ibuprofen or naproxen -other medicines that prolong the QT interval (cause an abnormal heart rhythm) -rasagiline -supplements like St. John's wort, kava kava, valerian -tramadol -tryptophan This list may not describe all possible interactions. Give your health care provider a list of all the medicines, herbs, non-prescription drugs, or dietary supplements you use. Also tell them if you smoke, drink alcohol, or use illegal drugs. Some items may interact with your medicine. What should I watch for while using this medicine? Tell your doctor if your symptoms do not get better or if they get worse. Visit your doctor or health care professional for regular checks on your progress. Because it may take several weeks to see the full effects of this medicine, it is important to continue your treatment as prescribed by your doctor. Patients and their families should watch out for new  or worsening thoughts of suicide or depression. Also   watch out for sudden changes in feelings such as feeling anxious, agitated, panicky, irritable, hostile, aggressive, impulsive, severely restless, overly excited and hyperactive, or not being able to sleep. If this happens, especially at the beginning of treatment or after a change in dose, call your health care professional. You may get drowsy or dizzy. Do not drive, use machinery, or do anything that needs mental alertness until you know how this medicine affects you. Do not stand or sit up quickly, especially if you are an older patient. This reduces the risk of dizzy or fainting spells. Alcohol may interfere with the effect of this medicine. Avoid alcoholic drinks. This medicine may cause dry eyes and blurred vision. If you wear contact lenses you may feel some discomfort. Lubricating drops may help. See your eye doctor if the problem does not go away or is severe. Your mouth may get dry. Chewing sugarless gum, sucking hard candy and drinking plenty of water may help. Contact your doctor if the problem does not go away or is severe. What side effects may I notice from receiving this medicine? Side effects that you should report to your doctor or health care professional as soon as possible: -allergic reactions like skin rash, itching or hives, swelling of the face, lips, or tongue -elevated mood, decreased need for sleep, racing thoughts, impulsive behavior -confusion -fast, irregular heartbeat -feeling faint or lightheaded, falls -feeling agitated, angry, or irritable -loss of balance or coordination -painful or prolonged erections -restlessness, pacing, inability to keep still -suicidal thoughts or other mood changes -tremors -trouble sleeping -seizures -unusual bleeding or bruising Side effects that usually do not require medical attention (report to your doctor or health care professional if they continue or are bothersome): -change in  sex drive or performance -change in appetite or weight -constipation -headache -muscle aches or pains -nausea This list may not describe all possible side effects. Call your doctor for medical advice about side effects. You may report side effects to FDA at 1-800-FDA-1088. Where should I keep my medicine? Keep out of the reach of children. Store at room temperature between 15 and 30 degrees C (59 to 86 degrees F). Protect from light. Keep container tightly closed. Throw away any unused medicine after the expiration date. NOTE: This sheet is a summary. It may not cover all possible information. If you have questions about this medicine, talk to your doctor, pharmacist, or health care provider.  2018 Elsevier/Gold Standard (2015-05-25 16:57:05)  

## 2017-08-01 ENCOUNTER — Ambulatory Visit: Payer: Medicare Other | Admitting: Podiatry

## 2017-08-15 ENCOUNTER — Encounter: Payer: Self-pay | Admitting: Podiatry

## 2017-08-15 ENCOUNTER — Other Ambulatory Visit: Payer: Self-pay | Admitting: Family Medicine

## 2017-08-15 ENCOUNTER — Ambulatory Visit (INDEPENDENT_AMBULATORY_CARE_PROVIDER_SITE_OTHER): Payer: Medicare Other | Admitting: Family Medicine

## 2017-08-15 ENCOUNTER — Ambulatory Visit (INDEPENDENT_AMBULATORY_CARE_PROVIDER_SITE_OTHER): Payer: Medicare Other | Admitting: Podiatry

## 2017-08-15 ENCOUNTER — Encounter: Payer: Self-pay | Admitting: Family Medicine

## 2017-08-15 VITALS — BP 120/50 | HR 91 | Temp 98.1°F | Resp 16 | Wt 128.6 lb

## 2017-08-15 DIAGNOSIS — N183 Chronic kidney disease, stage 3 unspecified: Secondary | ICD-10-CM

## 2017-08-15 DIAGNOSIS — B351 Tinea unguium: Secondary | ICD-10-CM

## 2017-08-15 DIAGNOSIS — E782 Mixed hyperlipidemia: Secondary | ICD-10-CM

## 2017-08-15 DIAGNOSIS — E890 Postprocedural hypothyroidism: Secondary | ICD-10-CM

## 2017-08-15 DIAGNOSIS — J0100 Acute maxillary sinusitis, unspecified: Secondary | ICD-10-CM | POA: Diagnosis not present

## 2017-08-15 DIAGNOSIS — M79676 Pain in unspecified toe(s): Secondary | ICD-10-CM

## 2017-08-15 DIAGNOSIS — J449 Chronic obstructive pulmonary disease, unspecified: Secondary | ICD-10-CM

## 2017-08-15 MED ORDER — DOXYCYCLINE HYCLATE 100 MG PO TABS: 100.0000 mg | ORAL_TABLET | Freq: Two times a day (BID) | ORAL | 0 refills | Status: DC

## 2017-08-15 MED ORDER — PREDNISONE 20 MG PO TABS: ORAL_TABLET | ORAL | 0 refills | Status: DC

## 2017-08-15 NOTE — Progress Notes (Signed)
  Subjective:     Patient ID: Sandra Brown, female   DOB: 29-Nov-1926, 84 y.o.   MRN: [id number] Chief Complaint  Patient presents with  . COPD    Patient returns to office today for follow up from 03/06/17. Patient states that she is feeling well today and denies symptoms of shortness of breath or wheezing, she states that she has been using her inhaler   HPI Patient reports increased sinus pressure, purulent sinus drainage, post nasal drainage and accompanying cough. States she is going to a wedding and wishes to feel better. It is also time for her labs to be updated.  Review of Systems     Objective:   Physical Exam  Constitutional: She appears well-developed and well-nourished. No distress.  Ears: T.M's intact without inflammation Sinuses: mild maxillary sinus tenderness Throat:tonsils absent; no erythema Neck: no cervical adenopathy Lungs: clear     Assessment:    1. Acute non-recurrent maxillary sinusitis: saline irrigation sample provided. - doxycycline (VIBRA-TABS) 100 MG tablet; Take 1 tablet (100 mg total) by mouth 2 (two) times daily.  Dispense: 20 tablet; Refill: 0  2. Chronic obstructive pulmonary disease, unspecified COPD type ([place]) - predniSONE (DELTASONE) 20 MG tablet; One pill twice daily for 5 days  Dispense: 10 tablet; Refill: 0  3. Postoperative hypothyroidism - T4, free - TSH  4. CKD (chronic kidney disease) stage 3, GFR 30-59 ml/min (HCC) - Comprehensive metabolic panel  5. Mixed hyperlipidemia - Lipid panel    Plan:    Start prednisone if increased shortness of breath. We will call with lab results. She intends to have them drawn at [facility].

## 2017-08-15 NOTE — Patient Instructions (Signed)
Use saline irrigation. Start the prednisone if you become increasingly short of breath. We will call you with the lab results when available.

## 2017-08-15 NOTE — Addendum Note (Signed)
Addended by: Quay Burow on: 08/15/2017 02:48 PM   Modules accepted: Orders

## 2017-08-18 DIAGNOSIS — E782 Mixed hyperlipidemia: Secondary | ICD-10-CM | POA: Diagnosis not present

## 2017-08-18 DIAGNOSIS — N183 Chronic kidney disease, stage 3 (moderate): Secondary | ICD-10-CM | POA: Diagnosis not present

## 2017-08-18 DIAGNOSIS — E890 Postprocedural hypothyroidism: Secondary | ICD-10-CM | POA: Diagnosis not present

## 2017-08-18 NOTE — Progress Notes (Signed)
   SUBJECTIVE Patient presents to office today complaining of elongated, thickened nails that cause pain while ambulating in shoes. She is unable to trim her own nails. Patient is here for further evaluation and treatment.  Past Medical History:  Diagnosis Date  . Cataract   . COPD (chronic obstructive pulmonary disease) (HCC)   . Depression   . Difficulty swallowing   . Frequent headaches   . Hearing loss   . Hypertension   . Hypothyroidism   . Reflux     OBJECTIVE General Patient is awake, alert, and oriented x 3 and in no acute distress. Derm Skin is dry and supple bilateral. Negative open lesions or macerations. Remaining integument unremarkable. Nails are tender, long, thickened and dystrophic with subungual debris, consistent with onychomycosis, 1-5 bilateral. No signs of infection noted. Vasc  DP and PT pedal pulses palpable bilaterally. Temperature gradient within normal limits.  Neuro Epicritic and protective threshold sensation grossly intact bilaterally.  Musculoskeletal Exam No symptomatic pedal deformities noted bilateral. Muscular strength within normal limits.  ASSESSMENT 1. Onychodystrophic nails 1-5 bilateral with hyperkeratosis of nails.  2. Onychomycosis of nail due to dermatophyte bilateral 3. Pain in foot bilateral  PLAN OF CARE 1. Patient evaluated today.  2. Instructed to maintain good pedal hygiene and foot care.  3. Mechanical debridement of nails 1-5 bilaterally performed using a nail nipper. Filed with dremel without incident.  4. Return to clinic in 3 mos.    Curry Dulski M. Zebediah Beezley, DPM [facility]  Dr. Berdella Bacot M. Jeanifer Halliday, DPM    [address]                                        [place], [place] [address]                Office [phone]  Fax [phone]     

## 2017-08-29 ENCOUNTER — Ambulatory Visit: Payer: Medicare Other | Admitting: Psychiatry

## 2017-09-05 ENCOUNTER — Other Ambulatory Visit: Payer: Self-pay

## 2017-09-05 ENCOUNTER — Ambulatory Visit (INDEPENDENT_AMBULATORY_CARE_PROVIDER_SITE_OTHER): Payer: Medicare Other | Admitting: Psychiatry

## 2017-09-05 ENCOUNTER — Encounter: Payer: Self-pay | Admitting: Psychiatry

## 2017-09-05 VITALS — BP 118/62 | HR 108 | Temp 97.9°F | Wt 126.0 lb

## 2017-09-05 DIAGNOSIS — F330 Major depressive disorder, recurrent, mild: Secondary | ICD-10-CM | POA: Diagnosis not present

## 2017-09-05 DIAGNOSIS — F5105 Insomnia due to other mental disorder: Secondary | ICD-10-CM

## 2017-09-05 MED ORDER — BUPROPION HCL 75 MG PO TABS: 75.0000 mg | ORAL_TABLET | Freq: Every morning | ORAL | 1 refills | Status: DC

## 2017-09-05 MED ORDER — VENLAFAXINE HCL ER 37.5 MG PO CP24: 37.5000 mg | ORAL_CAPSULE | Freq: Every day | ORAL | 1 refills | Status: DC

## 2017-09-05 MED ORDER — VENLAFAXINE HCL ER 150 MG PO CP24: 150.0000 mg | ORAL_CAPSULE | Freq: Every day | ORAL | 1 refills | Status: DC

## 2017-09-05 NOTE — Patient Instructions (Signed)
       Bupropion tablets (Depression/Mood Disorders) What is this medicine? BUPROPION (byoo PROE pee on) is used to treat depression. This medicine may be used for other purposes; ask your health care provider or pharmacist if you have questions. COMMON BRAND NAME(S): Wellbutrin What should I tell my health care provider before I take this medicine? They need to know if you have any of these conditions: -an eating disorder, such as anorexia or bulimia -bipolar disorder or psychosis -diabetes or high blood sugar, treated with medication -glaucoma -heart disease, previous heart attack, or irregular heart beat -head injury or brain tumor -high blood pressure -kidney or liver disease -seizures -suicidal thoughts or a previous suicide attempt -Tourette's syndrome -weight loss -an unusual or allergic reaction to bupropion, other medicines, foods, dyes, or preservatives -breast-feeding -pregnant or trying to become pregnant How should I use this medicine? Take this medicine by mouth with a glass of water. Follow the directions on the prescription label. You can take it with or without food. If it upsets your stomach, take it with food. Take your medicine at regular intervals. Do not take your medicine more often than directed. Do not stop taking this medicine suddenly except upon the advice of your doctor. Stopping this medicine too quickly may cause serious side effects or your condition may worsen. A special MedGuide will be given to you by the pharmacist with each prescription and refill. Be sure to read this information carefully each time. Talk to your pediatrician regarding the use of this medicine in children. Special care may be needed. Overdosage: If you think you have taken too much of this medicine contact a poison control center or emergency room at once. NOTE: This medicine is only for you. Do not share this medicine with others. What if I miss a dose? If you miss a dose,  take it as soon as you can. If it is less than four hours to your next dose, take only that dose and skip the missed dose. Do not take double or extra doses. What may interact with this medicine? Do not take this medicine with any of the following medications: -linezolid -MAOIs like Azilect, Carbex, Eldepryl, Marplan, Nardil, and Parnate -methylene blue (injected into a vein) -other medicines that contain bupropion like Zyban This medicine may also interact with the following medications: -alcohol -certain medicines for anxiety or sleep -certain medicines for blood pressure like metoprolol, propranolol -certain medicines for depression or psychotic disturbances -certain medicines for HIV or AIDS like efavirenz, lopinavir, nelfinavir, ritonavir -certain medicines for irregular heart beat like propafenone, flecainide -certain medicines for Parkinson's disease like amantadine, levodopa -certain medicines for seizures like carbamazepine, phenytoin, phenobarbital -cimetidine -clopidogrel -cyclophosphamide -digoxin -furazolidone -isoniazid -nicotine -orphenadrine -procarbazine -steroid medicines like prednisone or cortisone -stimulant medicines for attention disorders, weight loss, or to stay awake -tamoxifen -theophylline -thiotepa -ticlopidine -tramadol -warfarin This list may not describe all possible interactions. Give your health care provider a list of all the medicines, herbs, non-prescription drugs, or dietary supplements you use. Also tell them if you smoke, drink alcohol, or use illegal drugs. Some items may interact with your medicine. What should I watch for while using this medicine? Tell your doctor if your symptoms do not get better or if they get worse. Visit your doctor or health care professional for regular checks on your progress. Because it may take several weeks to see the full effects of this medicine, it is important to continue your treatment as prescribed by    your doctor. Patients and their families should watch out for new or worsening thoughts of suicide or depression. Also watch out for sudden changes in feelings such as feeling anxious, agitated, panicky, irritable, hostile, aggressive, impulsive, severely restless, overly excited and hyperactive, or not being able to sleep. If this happens, especially at the beginning of treatment or after a change in dose, call your health care professional. Avoid alcoholic drinks while taking this medicine. Drinking excessive alcoholic beverages, using sleeping or anxiety medicines, or quickly stopping the use of these agents while taking this medicine may increase your risk for a seizure. Do not drive or use heavy machinery until you know how this medicine affects you. This medicine can impair your ability to perform these tasks. Do not take this medicine close to bedtime. It may prevent you from sleeping. Your mouth may get dry. Chewing sugarless gum or sucking hard candy, and drinking plenty of water may help. Contact your doctor if the problem does not go away or is severe. What side effects may I notice from receiving this medicine? Side effects that you should report to your doctor or health care professional as soon as possible: -allergic reactions like skin rash, itching or hives, swelling of the face, lips, or tongue -breathing problems -changes in vision -confusion -elevated mood, decreased need for sleep, racing thoughts, impulsive behavior -fast or irregular heartbeat -hallucinations, loss of contact with reality -increased blood pressure -redness, blistering, peeling or loosening of the skin, including inside the mouth -seizures -suicidal thoughts or other mood changes -unusually weak or tired -vomiting Side effects that usually do not require medical attention (report to your doctor or health care professional if they continue or are bothersome): -constipation -headache -loss of  appetite -nausea -tremors -weight loss This list may not describe all possible side effects. Call your doctor for medical advice about side effects. You may report side effects to FDA at 1-800-FDA-1088. Where should I keep my medicine? Keep out of the reach of children. Store at room temperature between 20 and 25 degrees C (68 and 77 degrees F), away from direct sunlight and moisture. Keep tightly closed. Throw away any unused medicine after the expiration date. NOTE: This sheet is a summary. It may not cover all possible information. If you have questions about this medicine, talk to your doctor, pharmacist, or health care provider.  2018 Elsevier/Gold Standard (2015-06-16 13:44:21)  

## 2017-09-05 NOTE — Progress Notes (Signed)
[place] MD OP Progress Note  09/05/2017 11:48 AM CAMILE ESTERS  MRN:  [id number]  Chief Complaint: ' I am here for follow up." Chief Complaint    Follow-up; Medication Refill     HPI: Sandra Brown is an 84 year old Caucasian female, widowed, lives at [facility] assisted living facility, presented to the clinic today for a follow-up visit.  Patient today presented along with her daughter [name].  Patient has a history of depression, sleep problems as well as multiple medical problems including COPD, GERD and hypothyroidism.  Patient reports she has been struggling with some sadness, lack of motivation and tiredness during the day.  She reports she wakes up in the morning and is ready to go back to bed again.  She reports she has been trying to keep herself busy however some days it is difficult.  Patient reports she continues to be compliant with her medications.  Patient reports sleep as okay, sleeps up to 6-7 hours at night.  She does go to bed very late however is able to stay asleep once she falls asleep.  She did try sleep aids in the past but they did not help.  Patient denies any suicidality.  Patient denies any perceptual disturbances.  Patient denies any homicidality.  Patient continues to have good social support system. Visit Diagnosis:    ICD-10-CM   1. MDD (major depressive disorder), recurrent episode, mild (HCC) F33.0 venlafaxine XR (EFFEXOR XR) 37.5 MG 24 hr capsule    venlafaxine XR (EFFEXOR-XR) 150 MG 24 hr capsule    buPROPion (WELLBUTRIN) 75 MG tablet  2. Insomnia due to mental condition F51.05     Past Psychiatric History: Reviewed past psychiatric history from my progress note on 03/21/2017  Past Medical History:  Past Medical History:  Diagnosis Date  . Cataract   . COPD (chronic obstructive pulmonary disease) ([place])   . Depression   . Difficulty swallowing   . Frequent headaches   . Hearing loss   . Hypertension   . Hypothyroidism   . Reflux     Past  Surgical History:  Procedure Laterality Date  . ABDOMINAL HYSTERECTOMY    . APPENDECTOMY    . LUMBAR LAMINECTOMY    . PARATHYROIDECTOMY    . TOTAL HIP ARTHROPLASTY     x 4    Family Psychiatric History: Reviewed family psychiatric history from my progress note on 03/21/2017  Family History:  Family History  Problem Relation Age of Onset  . Stroke Mother   . Hypertension Mother   . Heart disease Father   . Hypertension Father     Social History: Have reviewed social history from my progress note on 03/21/2017 Social History   Socioeconomic History  . Marital status: Widowed    Spouse name: Not on file  . Number of children: Not on file  . Years of education: Not on file  . Highest education level: Not on file  Occupational History  . Not on file  Social Needs  . Financial resource strain: Not on file  . Food insecurity:    Worry: Not on file    Inability: Not on file  . Transportation needs:    Medical: Not on file    Non-medical: Not on file  Tobacco Use  . Smoking status: Never Smoker  . Smokeless tobacco: Never Used  . Tobacco comment: quit 1954  Substance and Sexual Activity  . Alcohol use: No  . Drug use: No  . Sexual activity: Never  Lifestyle  .  Physical activity:    Days per week: Not on file    Minutes per session: Not on file  . Stress: Not on file  Relationships  . Social connections:    Talks on phone: Not on file    Gets together: Not on file    Attends religious service: Not on file    Active member of club or organization: Not on file    Attends meetings of clubs or organizations: Not on file    Relationship status: Not on file  Other Topics Concern  . Not on file  Social History Narrative  . Not on file    Allergies:  Allergies  Allergen Reactions  . Sulfa Antibiotics Rash and Itching    Other reaction(s): Diarrhea and vomiting (finding)  . Erythromycin Nausea And Vomiting    Other reaction(s): Diarrhea and vomiting (finding)  .  Aspirin Other (See Comments), Tinitus and Nausea And Vomiting    Ringing of the ears, caused hearing loss both ears Ringing in ears  . Contrast Media [Iodinated Diagnostic Agents] Rash and Hives  . Latex Rash and Itching  . Penicillin G Rash  . Penicillins Rash    Other reaction(s): UNKNOWN  . Tape Rash    Other reaction(s): UNKNOWN Adhesive Other reaction(s): UNKNOWN Adhesive    Metabolic Disorder Labs: No results found for: HGBA1C, MPG No results found for: PROLACTIN No results found for: CHOL, TRIG, HDL, CHOLHDL, VLDL, LDLCALC No results found for: TSH  Therapeutic Level Labs: No results found for: LITHIUM No results found for: VALPROATE No components found for:  CBMZ  Current Medications: Current Outpatient Medications  Medication Sig Dispense Refill  . acetaminophen (TYLENOL) 500 MG tablet Take 1 tablet (500 mg total) by mouth every 6 (six) hours as needed for moderate pain. Also give 500 mg three times a day for 7  Days. 60 tablet 0  . albuterol (PROVENTIL HFA;VENTOLIN HFA) 108 (90 Base) MCG/ACT inhaler Inhale 2 puffs into the lungs every 6 (six) hours as needed for wheezing or shortness of breath. 1 Inhaler 2  . AMBULATORY NON FORMULARY MEDICATION Medication Name: incentive spirometry Use as directed 1 each 0  . celecoxib (CELEBREX) 200 MG capsule TAKE 1 CAPSULE DAILY 90 capsule 1  . [facility] RANITIDINE 75 MG tablet Take 75 mg by mouth 2 (two) times daily as needed.   3  . DEXILANT 60 MG capsule TAKE 1 CAPSULE DAILY 90 capsule 1  . doxycycline (VIBRA-TABS) 100 MG tablet Take 1 tablet (100 mg total) by mouth 2 (two) times daily. 20 tablet 0  . fluticasone (FLONASE) 50 MCG/ACT nasal spray USE 2 SPRAYS NASALLY DAILY 48 g 2  . furosemide (LASIX) 40 MG tablet Take 1 tablet (40 mg total) daily by mouth. 90 tablet 3  . levocetirizine (XYZAL) 5 MG tablet TAKE 1 TABLET AT BEDTIME EVERY NIGHT 90 tablet 1  . levothyroxine (SYNTHROID, LEVOTHROID) 50 MCG tablet Take 1 tablet (50 mcg  total) daily by mouth. 90 tablet 3  . lidocaine (LIDODERM) 5 % Place 1 patch onto the skin daily. Remove & Discard patch within 12 hours or as directed by MD 30 patch 5  . loperamide (IMODIUM) 2 MG capsule Take 2 mg by mouth as needed for diarrhea or loose stools.    . magnesium gluconate (MAGONATE) 500 MG tablet Take 500 mg by mouth daily.    . montelukast (SINGULAIR) 10 MG tablet     . Multiple Vitamin (MULTIVITAMIN) tablet Take 1 tablet by mouth  daily.    . OSCIMIN 0.125 MG tablet TAKE 1 TABLET TWICE A DAY 180 tablet 3  . predniSONE (DELTASONE) 20 MG tablet One pill twice daily for 5 days 10 tablet 0  . Probiotic Product (PROBIOTIC COLON SUPPORT PO) Take 1 capsule by mouth daily.    . simvastatin (ZOCOR) 20 MG tablet Take 1 tablet (20 mg total) daily by mouth. 90 tablet 3  . SPIRIVA HANDIHALER 18 MCG inhalation capsule INHALE THE CONTENTS OF 1 CAPSULE DAILY 90 capsule 3  . spironolactone (ALDACTONE) 25 MG tablet TAKE 1 TABLET DAILY 90 tablet 1  . tamsulosin (FLOMAX) 0.4 MG CAPS capsule TAKE 1 CAPSULE DAILY 90 capsule 1  . traZODone (DESYREL) 50 MG tablet Take 0.5-1 tablets (25-50 mg total) by mouth at bedtime. For sleep 90 tablet 0  . triamcinolone cream (KENALOG) 0.1 % APP BID UNTIL CLEAR/FLAT  0  . venlafaxine XR (EFFEXOR XR) 37.5 MG 24 hr capsule Take 1 capsule (37.5 mg total) by mouth daily. To be taken with 150 mg 90 capsule 1  . venlafaxine XR (EFFEXOR-XR) 150 MG 24 hr capsule Take 1 capsule (150 mg total) by mouth daily. To be taken with 37.5 mg 90 capsule 1  . buPROPion (WELLBUTRIN) 75 MG tablet Take 1 tablet (75 mg total) by mouth every morning. 90 tablet 1   No current facility-administered medications for this visit.      Musculoskeletal: Strength & Muscle Tone: within normal limits Gait & Station: uses a walker Patient leans: N/A  Psychiatric Specialty Exam: Review of Systems  Psychiatric/Behavioral: Positive for depression.  All other systems reviewed and are  negative.   Blood pressure 118/62, pulse (!) 108, temperature 97.9 F (36.6 C), temperature source Oral, weight 126 lb (57.2 kg).Body mass index is 24.61 kg/m.  General Appearance: Casual  Eye Contact:  Fair  Speech:  Clear and Coherent  Volume:  Normal  Mood:  Dysphoric  Affect:  Appropriate  Thought Process:  Goal Directed and Descriptions of Associations: Intact  Orientation:  Full (Time, Place, and Person)  Thought Content: Logical   Suicidal Thoughts:  No  Homicidal Thoughts:  No  Memory:  Immediate;   Fair Recent;   Fair Remote;   Fair  Judgement:  Fair  Insight:  Fair  Psychomotor Activity:  Normal  Concentration:  Concentration: Fair and Attention Span: Fair  Recall:  [employer] of Knowledge: Fair  Language: Fair  Akathisia:  No  Handed:  Right  AIMS (if indicated): na  Assets:  Communication Skills Desire for Improvement Social Support  ADL's:  Intact  Cognition: WNL  Sleep:  restless   Screenings: PHQ2-9     Procedure visit from 09/04/2015 in [place] Procedure visit from 10/03/2014 in [place] Office Visit from 09/21/2014 in [place]  PHQ-2 Total Score  0  0  1       Assessment and Plan: Keoni is an 84 year old Caucasian female who has a history of depression, COPD, hypothyroidism, hypertension, presented to the clinic today for a follow-up visit.  Patient reports some recent mood symptoms as well as lack of energy and motivation.  Discussed adding Wellbutrin to her medication regimen.  She agrees with plan.  Plan as noted below.  Plan MDD Continue Effexor 187.5 mg p.o. daily Add Wellbutrin 75 mg p.o. daily in the morning.  For insomnia Patient falls asleep late which has been her  routine for a long time.  She however is able to sleep for 7 hours /  night.  She also takes naps during the day.  Will not make more  medication changes today. Epworth sleep scale was done in the past and she scored 7 on 03/21/2017.  Follow-up in clinic in 2 months or sooner if needed.  More than 50 % of the time was spent for psychoeducation and supportive psychotherapy and care coordination.  This note was generated in part or whole with voice recognition software. Voice recognition is usually quite accurate but there are transcription errors that can and very often do occur. I apologize for any typographical errors that were not detected and corrected.       Ursula Alert, MD 09/05/2017, 11:48 AM

## 2017-09-12 DIAGNOSIS — L281 Prurigo nodularis: Secondary | ICD-10-CM | POA: Diagnosis not present

## 2017-09-12 DIAGNOSIS — L821 Other seborrheic keratosis: Secondary | ICD-10-CM | POA: Diagnosis not present

## 2017-09-12 DIAGNOSIS — Z08 Encounter for follow-up examination after completed treatment for malignant neoplasm: Secondary | ICD-10-CM | POA: Diagnosis not present

## 2017-09-12 DIAGNOSIS — Z85828 Personal history of other malignant neoplasm of skin: Secondary | ICD-10-CM | POA: Diagnosis not present

## 2017-09-12 DIAGNOSIS — L708 Other acne: Secondary | ICD-10-CM | POA: Diagnosis not present

## 2017-09-18 ENCOUNTER — Other Ambulatory Visit: Payer: Self-pay | Admitting: Family Medicine

## 2017-09-18 ENCOUNTER — Telehealth: Payer: Self-pay | Admitting: Family Medicine

## 2017-09-18 ENCOUNTER — Other Ambulatory Visit: Payer: Self-pay | Admitting: Psychiatry

## 2017-09-18 MED ORDER — TIOTROPIUM BROMIDE MONOHYDRATE 18 MCG IN CAPS: 1.0000 | ORAL_CAPSULE | Freq: Every day | RESPIRATORY_TRACT | 3 refills | Status: DC

## 2017-09-18 NOTE — Telephone Encounter (Signed)
[facility] faxed a refill request for the following medication.Thanks CC  SPIRIVA HANDIHALER 18 MCG inhalation capsule

## 2017-09-18 NOTE — Telephone Encounter (Signed)
Last filled 09/23/16 please review,. KW

## 2017-09-18 NOTE — Telephone Encounter (Signed)
done

## 2017-09-27 ENCOUNTER — Other Ambulatory Visit: Payer: Self-pay | Admitting: Family Medicine

## 2017-09-27 DIAGNOSIS — J309 Allergic rhinitis, unspecified: Secondary | ICD-10-CM

## 2017-09-29 NOTE — Telephone Encounter (Signed)
Please review, patient of [facility] ( last filled 03/31/17). KW

## 2017-09-30 ENCOUNTER — Encounter: Payer: Self-pay | Admitting: Family Medicine

## 2017-09-30 ENCOUNTER — Ambulatory Visit (INDEPENDENT_AMBULATORY_CARE_PROVIDER_SITE_OTHER): Payer: Medicare Other | Admitting: Family Medicine

## 2017-09-30 ENCOUNTER — Telehealth: Payer: Self-pay | Admitting: Family Medicine

## 2017-09-30 DIAGNOSIS — L03116 Cellulitis of left lower limb: Secondary | ICD-10-CM | POA: Diagnosis not present

## 2017-09-30 DIAGNOSIS — L0390 Cellulitis, unspecified: Secondary | ICD-10-CM | POA: Insufficient documentation

## 2017-09-30 MED ORDER — CEPHALEXIN 500 MG PO CAPS: 500.0000 mg | ORAL_CAPSULE | Freq: Four times a day (QID) | ORAL | 0 refills | Status: DC

## 2017-09-30 NOTE — Telephone Encounter (Signed)
Dr. Caryn Section, please advise about the follow up of both eyes. I don't see anything about rechecking eyes in 1 week in the office note.

## 2017-09-30 NOTE — Telephone Encounter (Signed)
[name] with [facility] called to say the Patient uses the [facility] at [address] and [address] for any prescriptions that need to be sent.  This needs to changes in her chart.  [name] also needs to know why Dr. Caryn Section want to check both pt's eyes in one week.  She wants to know why and what is wrong with her eyes.  She said there was no message about the eyes sent with the patient on her discharge from office papers.  Sandra Brown's call back is [phone]

## 2017-09-30 NOTE — Progress Notes (Signed)
[name] Female    DOB: 03-28-1926   84 y.o.   MRN: [id number] Visit Date: 09/30/2017  Today's Provider: Lelon Huh, MD   Chief Complaint  Patient presents with  . Leg Swelling    x 5 days   Subjective:    HPI Leg swelling: Patient comes in today with swelling and redness of the left lower leg. She also has some pain in her leg. She states the symptoms have worsened since onset and are starting to spread into her left foot. Doxycycline was called in for her which she started on 09-25-2017, but reports that redness and swelling have only worsened since then. Denies any fevers, chills or sweats.     Allergies  Allergen Reactions  . Sulfa Antibiotics Rash and Itching    Other reaction(s): Diarrhea and vomiting (finding)  . Erythromycin Nausea And Vomiting    Other reaction(s): Diarrhea and vomiting (finding)  . Aspirin Other (See Comments), Tinitus and Nausea And Vomiting    Ringing of the ears, caused hearing loss both ears Ringing in ears  . Contrast Media [Iodinated Diagnostic Agents] Rash and Hives  . Latex Rash and Itching  . Penicillin G Rash  . Penicillins Rash    Other reaction(s): UNKNOWN  . Tape Rash    Other reaction(s): UNKNOWN Adhesive Other reaction(s): UNKNOWN Adhesive     Current Outpatient Medications:  .  acetaminophen (TYLENOL) 500 MG tablet, Take 1 tablet (500 mg total) by mouth every 6 (six) hours as needed for moderate pain. Also give 500 mg three times a day for 7  Days., Disp: 60 tablet, Rfl: 0 .  albuterol (PROVENTIL HFA;VENTOLIN HFA) 108 (90 Base) MCG/ACT inhaler, Inhale 2 puffs into the lungs every 6 (six) hours as needed for wheezing or shortness of breath., Disp: 1 Inhaler, Rfl: 2 .  AMBULATORY NON FORMULARY MEDICATION, Medication Name: incentive spirometry Use as directed, Disp: 1 each, Rfl: 0 .  buPROPion (WELLBUTRIN) 75 MG tablet, Take 1 tablet (75 mg total) by mouth every morning., Disp: 90 tablet, Rfl: 1 .   celecoxib (CELEBREX) 200 MG capsule, TAKE 1 CAPSULE DAILY, Disp: 90 capsule, Rfl: 1 .  [facility] RANITIDINE 75 MG tablet, Take 75 mg by mouth 2 (two) times daily as needed. , Disp: , Rfl: 3 .  DEXILANT 60 MG capsule, TAKE 1 CAPSULE DAILY, Disp: 90 capsule, Rfl: 1 .  doxycycline (VIBRA-TABS) 100 MG tablet, Take 1 tablet (100 mg total) by mouth 2 (two) times daily., Disp: 20 tablet, Rfl: 0 .  fluticasone (FLONASE) 50 MCG/ACT nasal spray, USE 2 SPRAYS NASALLY DAILY, Disp: 48 g, Rfl: 2 .  furosemide (LASIX) 40 MG tablet, Take 1 tablet (40 mg total) daily by mouth., Disp: 90 tablet, Rfl: 3 .  levocetirizine (XYZAL) 5 MG tablet, TAKE 1 TABLET AT BEDTIME EVERY NIGHT, Disp: 90 tablet, Rfl: 4 .  levothyroxine (SYNTHROID, LEVOTHROID) 50 MCG tablet, Take 1 tablet (50 mcg total) daily by mouth., Disp: 90 tablet, Rfl: 3 .  lidocaine (LIDODERM) 5 %, Place 1 patch onto the skin daily. Remove & Discard patch within 12 hours or as directed by MD, Disp: 30 patch, Rfl: 5 .  loperamide (IMODIUM) 2 MG capsule, Take 2 mg by mouth as needed for diarrhea or loose stools., Disp: , Rfl:  .  magnesium gluconate (MAGONATE) 500 MG tablet, Take 500 mg by mouth daily., Disp: , Rfl:  .  montelukast (SINGULAIR) 10 MG tablet, , Disp: , Rfl:  .  Multiple Vitamin (MULTIVITAMIN) tablet, Take 1 tablet by mouth daily., Disp: , Rfl:  .  OSCIMIN 0.125 MG tablet, TAKE 1 TABLET TWICE A DAY, Disp: 180 tablet, Rfl: 3 .  predniSONE (DELTASONE) 20 MG tablet, One pill twice daily for 5 days, Disp: 10 tablet, Rfl: 0 .  Probiotic Product (PROBIOTIC COLON SUPPORT PO), Take 1 capsule by mouth daily., Disp: , Rfl:  .  simvastatin (ZOCOR) 20 MG tablet, Take 1 tablet (20 mg total) daily by mouth., Disp: 90 tablet, Rfl: 3 .  spironolactone (ALDACTONE) 25 MG tablet, TAKE 1 TABLET DAILY, Disp: 90 tablet, Rfl: 1 .  tamsulosin (FLOMAX) 0.4 MG CAPS capsule, TAKE 1 CAPSULE DAILY, Disp: 90 capsule, Rfl: 1 .  tiotropium (SPIRIVA HANDIHALER) 18 MCG inhalation  capsule, Place 1 capsule (18 mcg total) into inhaler and inhale daily., Disp: 90 capsule, Rfl: 3 .  traZODone (DESYREL) 50 MG tablet, TAKE ONE-HALF (1/2) TO ONE TABLET AT BEDTIME FOR SLEEP, Disp: 90 tablet, Rfl: 4 .  triamcinolone cream (KENALOG) 0.1 %, APP BID UNTIL CLEAR/FLAT, Disp: , Rfl: 0 .  venlafaxine XR (EFFEXOR XR) 37.5 MG 24 hr capsule, Take 1 capsule (37.5 mg total) by mouth daily. To be taken with 150 mg, Disp: 90 capsule, Rfl: 1 .  venlafaxine XR (EFFEXOR-XR) 150 MG 24 hr capsule, Take 1 capsule (150 mg total) by mouth daily. To be taken with 37.5 mg, Disp: 90 capsule, Rfl: 1  Review of Systems  Constitutional: Negative for appetite change, chills, fatigue and fever.  Respiratory: Negative for chest tightness and shortness of breath.   Cardiovascular: Positive for leg swelling. Negative for chest pain and palpitations.  Gastrointestinal: Negative for abdominal pain, nausea and vomiting.  Musculoskeletal: Positive for myalgias.  Skin: Positive for color change.  Neurological: Negative for dizziness and weakness.    Social History   Tobacco Use  . Smoking status: Never Smoker  . Smokeless tobacco: Never Used  . Tobacco comment: quit 1954  Substance Use Topics  . Alcohol use: No   Objective:   BP 96/61 (BP Location: Right Arm, Patient Position: Sitting, Cuff Size: Normal)   Pulse 97   Temp 98.4 F (36.9 C) (Oral)   Resp 20   Wt 126 lb (57.2 kg)   SpO2 96% Comment: room air  BMI 24.61 kg/m  Vitals:   09/30/17 1113  BP: 96/61  Pulse: 97  Resp: 20  Temp: 98.4 F (36.9 C)  TempSrc: Oral  SpO2: 96%  Weight: 126 lb (57.2 kg)     Physical Exam  General appearance: alert, well developed, well nourished, cooperative and in no distress Head: Normocephalic, without obvious abnormality, atraumatic Respiratory: Respirations even and unlabored, normal respiratory rate Extremities: Left anterior lower leg erythema, see photograph.     Assessment & Plan:     1.  Cellulitis of left lower extremity Slowly worsening despite treatment with doxycycline. Will change antibiotic to- cephALEXin (KEFLEX) 500 MG capsule; Take 1 capsule (500 mg total) by mouth 4 (four) times daily for 10 days.  Dispense: 40 capsule; Refill: 0  Return in about 1 week (around 10/07/2017).       Lelon Huh, MD  [facility]

## 2017-10-01 NOTE — Telephone Encounter (Signed)
Left message for Sandra Brown to return call.  

## 2017-10-01 NOTE — Telephone Encounter (Signed)
She is to return next week to check cellulitis in her leg, not her eyes.  Antibiotic prescription was sent to [facility] on [address] yesterday. Did they get that prescription, or does it need to be transferred to the other [facility]. Sandra Brown

## 2017-10-01 NOTE — Telephone Encounter (Signed)
LMOVM for Sandra Brown to return call.

## 2017-10-01 NOTE — Telephone Encounter (Signed)
Sandra Brown at [facility] returned your call.  She ask that you call [name] at [phone].  She is the assisted living nurse.  teri

## 2017-10-01 NOTE — Telephone Encounter (Signed)
Sandra Brown returning call

## 2017-10-02 NOTE — Telephone Encounter (Signed)
Sandra Brown from [facility] called to let you know that patient is on Keflex four times a day.

## 2017-10-02 NOTE — Telephone Encounter (Signed)
Per the previous documented note below, it looks like patient has started on the antibiotic Keflex that Dr. Caryn Section prescribed for cellulitis. I tried calling [name] to make sure she was advised of everything in this message. Left message to call back.

## 2017-10-02 NOTE — Telephone Encounter (Signed)
LMOVM for Sandra Brown to return call.

## 2017-10-08 ENCOUNTER — Other Ambulatory Visit: Payer: Self-pay | Admitting: Family Medicine

## 2017-10-08 DIAGNOSIS — M5136 Other intervertebral disc degeneration, lumbar region: Secondary | ICD-10-CM

## 2017-10-08 NOTE — Telephone Encounter (Signed)
Follow up appt was scheduled for 10/09/2017. Marland Kitchen

## 2017-10-09 ENCOUNTER — Ambulatory Visit (INDEPENDENT_AMBULATORY_CARE_PROVIDER_SITE_OTHER): Payer: Medicare Other | Admitting: Family Medicine

## 2017-10-09 ENCOUNTER — Encounter: Payer: Self-pay | Admitting: Family Medicine

## 2017-10-09 VITALS — BP 94/62 | HR 95 | Temp 98.1°F | Resp 18 | Wt 125.2 lb

## 2017-10-09 DIAGNOSIS — L03116 Cellulitis of left lower limb: Secondary | ICD-10-CM | POA: Diagnosis not present

## 2017-10-09 MED ORDER — CEPHALEXIN 500 MG PO CAPS: 500.0000 mg | ORAL_CAPSULE | Freq: Four times a day (QID) | ORAL | 0 refills | Status: DC

## 2017-10-09 NOTE — Patient Instructions (Signed)
Use a heating pad to your leg at low heat for several minutes daily.

## 2017-10-09 NOTE — Progress Notes (Signed)
  Subjective:     Patient ID: Sandra Brown, female   DOB: Apr 19, 1926, 84 y.o.   MRN: [id number] Chief Complaint  Patient presents with  . Cellulitis    Follow up for cellulitis   HPI Patient states that redness is no longer bright red and feels that it has improved somewhat. Reports prior cellulitis years ago took several weeks to clear.  Review of Systems     Objective:   Physical Exam  Constitutional: She appears well-developed and well-nourished. No distress.  Skin:  Fading erythema of left lower extremity but still present from just below her knee to above the ankle. No pitting but leg is mildly swollen when compared to the right.       Assessment:    1. Cellulitis of left lower extremity: continue abx - cephALEXin (KEFLEX) 500 MG capsule; Take 1 capsule (500 mg total) by mouth 4 (four) times daily for 10 days.  Dispense: 40 capsule; Refill: 0    Plan:    Discussed application of warm compresses and return in one week.

## 2017-10-17 ENCOUNTER — Ambulatory Visit (INDEPENDENT_AMBULATORY_CARE_PROVIDER_SITE_OTHER): Payer: Medicare Other | Admitting: Family Medicine

## 2017-10-17 ENCOUNTER — Encounter: Payer: Self-pay | Admitting: Family Medicine

## 2017-10-17 ENCOUNTER — Other Ambulatory Visit: Payer: Self-pay | Admitting: Family Medicine

## 2017-10-17 VITALS — BP 110/62 | HR 67 | Temp 97.7°F | Resp 17 | Wt 127.4 lb

## 2017-10-17 DIAGNOSIS — L03116 Cellulitis of left lower limb: Secondary | ICD-10-CM

## 2017-10-17 DIAGNOSIS — K219 Gastro-esophageal reflux disease without esophagitis: Secondary | ICD-10-CM

## 2017-10-17 MED ORDER — CEPHALEXIN 500 MG PO CAPS: 500.0000 mg | ORAL_CAPSULE | Freq: Three times a day (TID) | ORAL | 0 refills | Status: AC

## 2017-10-17 MED ORDER — PANTOPRAZOLE SODIUM 20 MG PO TBEC: 20.0000 mg | DELAYED_RELEASE_TABLET | Freq: Every day | ORAL | 1 refills | Status: DC

## 2017-10-17 MED ORDER — SUCRALFATE 1 G PO TABS: ORAL_TABLET | ORAL | 0 refills | Status: DC

## 2017-10-17 NOTE — Progress Notes (Signed)
  Subjective:     Patient ID: Sandra Brown, female   DOB: Sep 30, 1926, 84 y.o.   MRN: [id number] Chief Complaint  Patient presents with  . Cellulitis    Patient returns to office today for onw week follow up, at last office visit 10/09/17 we encouraged patient to continue using Keflex 500mg  q.i.d. Patient reports good compliance on medication and states that she has been applying heating pad to help with pain, patient reports still present redness and swelling.    HPI Reports that her leg feels better. Also wishes to switch PPI to pantoprazole as Dexilant not covered by insurance. Reports mild nausea and fullness after eating and wishes to try a medication for this. She is also on nsaid's chronically Accompanied by her daughter today.  Review of Systems     Objective:   Physical Exam  Constitutional: She appears well-developed and well-nourished. No distress.  Skin:  Left lower extremity with fading cellulitis and subjectively decreased swelling. 3" ACE wrap applied for daytime use.       Assessment:    1. Cellulitis of left lower extremity: resolving-will taper abx to tid. - cephALEXin (KEFLEX) 500 MG capsule; Take 1 capsule (500 mg total) by mouth 3 (three) times daily for 10 days.  Dispense: 21 capsule; Refill: 0  2. Gastroesophageal reflux disease without esophagitis: change to pantoprazole and add trial on sucralfate.     Plan:    Continue elevation and warm compresses. Use ACE wrap when up. Let me know if Carafate helps.

## 2017-10-17 NOTE — Patient Instructions (Addendum)
Continue ACE wrap when up during the day. Continue leg elevation and heat for 20 minutes twice daily. Start pantoprazole and stop Dexilant. May use ranitidine for break through symptoms. May take sucralfate  before a meal to help with stomach irritation/nausea after eating.

## 2017-10-27 ENCOUNTER — Other Ambulatory Visit: Payer: Self-pay | Admitting: Family Medicine

## 2017-10-29 ENCOUNTER — Other Ambulatory Visit: Payer: Self-pay | Admitting: Family Medicine

## 2017-10-29 DIAGNOSIS — R600 Localized edema: Secondary | ICD-10-CM

## 2017-10-29 DIAGNOSIS — E890 Postprocedural hypothyroidism: Secondary | ICD-10-CM

## 2017-10-29 DIAGNOSIS — E782 Mixed hyperlipidemia: Secondary | ICD-10-CM

## 2017-10-30 ENCOUNTER — Other Ambulatory Visit: Payer: Self-pay | Admitting: Family Medicine

## 2017-11-04 ENCOUNTER — Encounter: Payer: Self-pay | Admitting: Family Medicine

## 2017-11-04 ENCOUNTER — Ambulatory Visit (INDEPENDENT_AMBULATORY_CARE_PROVIDER_SITE_OTHER): Payer: Medicare Other | Admitting: Family Medicine

## 2017-11-04 VITALS — BP 100/50 | HR 99 | Temp 98.4°F | Resp 16 | Wt 125.2 lb

## 2017-11-04 DIAGNOSIS — I872 Venous insufficiency (chronic) (peripheral): Secondary | ICD-10-CM | POA: Diagnosis not present

## 2017-11-04 DIAGNOSIS — J441 Chronic obstructive pulmonary disease with (acute) exacerbation: Secondary | ICD-10-CM

## 2017-11-04 MED ORDER — DOXYCYCLINE HYCLATE 100 MG PO TABS: 100.0000 mg | ORAL_TABLET | Freq: Two times a day (BID) | ORAL | 0 refills | Status: DC

## 2017-11-04 NOTE — Progress Notes (Signed)
  Subjective:     Patient ID: Sandra Brown, female   DOB: 15-Feb-1926, 84 y.o.   MRN: [id number] Chief Complaint  Patient presents with  . Cough    Patient presents today for a cough she has had for a couple of weeks now. Patient states she has been taking mucinex for the cough.  . Cellulitis    Patient states her cellulitis is improving.   HPI Reports production of yellow sputum but no increased shortness of breath or cold sx. Remains on Mucinex and Spiriva. Has not been requiring the Albuterol at this time. Also has been wearing compression stockings with improvement in her left leg swelling. States leg erythema improves over the course of the night but reappears after she has been up. Accompanied by her daughter today.  Review of Systems     Objective:   Physical Exam  Constitutional: She appears well-developed and well-nourished. No distress.  Pulmonary/Chest: Effort normal. No respiratory distress. She has wheezes (transient in posterior lung fields).  Musculoskeletal: Edema: left lower extremity is markedly less swollen but rash is apparent  distal to her knee.       Assessment:    1. COPD exacerbation (HCC) - doxycycline (VIBRA-TABS) 100 MG tablet; Take 1 tablet (100 mg total) by mouth 2 (two) times daily.  Dispense: 20 tablet; Refill: 0  2. Venous stasis dermatitis of left lower extremity: continue compression stockings.    Plan:    Discussed use of albuterol inhaler. Continue Mucinex.

## 2017-11-04 NOTE — Patient Instructions (Addendum)
Discussed use of albuterol every 4-6 hours for shortness of breath. Continue Mucinex. Please check on the lab results from 08/15/17. Start doxycycline twice daily and see if your sputum clears and your left leg redness has improved.

## 2017-11-07 ENCOUNTER — Ambulatory Visit: Payer: Self-pay | Admitting: Psychiatry

## 2017-11-11 ENCOUNTER — Other Ambulatory Visit: Payer: Self-pay

## 2017-11-11 ENCOUNTER — Encounter: Payer: Self-pay | Admitting: Psychiatry

## 2017-11-11 ENCOUNTER — Ambulatory Visit (INDEPENDENT_AMBULATORY_CARE_PROVIDER_SITE_OTHER): Payer: Medicare Other | Admitting: Psychiatry

## 2017-11-11 VITALS — BP 146/70 | HR 98 | Temp 97.7°F | Wt 122.0 lb

## 2017-11-11 DIAGNOSIS — F5105 Insomnia due to other mental disorder: Secondary | ICD-10-CM

## 2017-11-11 DIAGNOSIS — F330 Major depressive disorder, recurrent, mild: Secondary | ICD-10-CM

## 2017-11-11 NOTE — Progress Notes (Signed)
[place] MD OP Progress Note  11/11/2017 11:20 AM Sandra Brown  MRN:  [id number]  Chief Complaint:  Chief Complaint    Follow-up; Medication Refill     HPI: Sandra Brown is an 84 yr old Caucasian female, widowed, lives at [facility] assisted living facility, presented to the clinic today for a follow-up visit.  She has a history of depression, sleep problems, COPD, GERD, hypothyroidism, recent bronchitis and cellulitis.  Patient reports today that she recently was treated for bronchiolitis and cellulitis of her lower extremities.  She reports she was on multiple medications including antibiotics for a long time.  She reports she  struggled with severe diarrhea, vomiting and so on from being on several medications.  She reports she is currently improving with regards to her cellulitis and cough. Her appetite has improved some . She however reports she continues to feel tired.  She also reports sleep as restless.  She reports she has been using some aromatherapy for sleep which helps to some extent.  Discussed other sleep medications however she reports she does not want any prescription medications at this time.  Discussed melatonin over-the-counter.  Discussed with her to take 3 mg to 6 mg.  Discussed with her to take it 90 minutes before her bedtime.  She agrees with plan.  She denies any suicidality.  She continues to have good social support from her family.  She denies any other concerns today. Visit Diagnosis:    ICD-10-CM   1. MDD (major depressive disorder), recurrent episode, mild ([place]) F33.0   2. Insomnia due to mental condition F51.05     Past Psychiatric History: Have reviewed past psychiatric history from my progress note on 03/21/2017  Past Medical History:  Past Medical History:  Diagnosis Date  . Cataract   . COPD (chronic obstructive pulmonary disease) ([place])   . Depression   . Difficulty swallowing   . Frequent headaches   . Hearing loss   . Hypertension   .  Hypothyroidism   . Reflux     Past Surgical History:  Procedure Laterality Date  . ABDOMINAL HYSTERECTOMY    . APPENDECTOMY    . LUMBAR LAMINECTOMY    . PARATHYROIDECTOMY    . TOTAL HIP ARTHROPLASTY     x 4    Family Psychiatric History: Reviewed family psychiatric history from my progress note on 03/21/2017  Family History:  Family History  Problem Relation Age of Onset  . Stroke Mother   . Hypertension Mother   . Heart disease Father   . Hypertension Father     Social History: Have reviewed social history from my progress note on 03/21/2017. Social History   Socioeconomic History  . Marital status: Widowed    Spouse name: Not on file  . Number of children: Not on file  . Years of education: Not on file  . Highest education level: Not on file  Occupational History  . Not on file  Social Needs  . Financial resource strain: Not on file  . Food insecurity:    Worry: Not on file    Inability: Not on file  . Transportation needs:    Medical: Not on file    Non-medical: Not on file  Tobacco Use  . Smoking status: Never Smoker  . Smokeless tobacco: Never Used  . Tobacco comment: quit 1954  Substance and Sexual Activity  . Alcohol use: No  . Drug use: No  . Sexual activity: Never  Lifestyle  . Physical activity:  Days per week: Not on file    Minutes per session: Not on file  . Stress: Not on file  Relationships  . Social connections:    Talks on phone: Not on file    Gets together: Not on file    Attends religious service: Not on file    Active member of club or organization: Not on file    Attends meetings of clubs or organizations: Not on file    Relationship status: Not on file  Other Topics Concern  . Not on file  Social History Narrative  . Not on file    Allergies:  Allergies  Allergen Reactions  . Sulfa Antibiotics Rash and Itching    Other reaction(s): Diarrhea and vomiting (finding)  . Erythromycin Nausea And Vomiting    Other  reaction(s): Diarrhea and vomiting (finding)  . Aspirin Other (See Comments), Tinitus and Nausea And Vomiting    Ringing of the ears, caused hearing loss both ears Ringing in ears  . Contrast Media [Iodinated Diagnostic Agents] Rash and Hives  . Latex Rash and Itching  . Penicillin G Rash  . Penicillins Rash    Other reaction(s): UNKNOWN  . Tape Rash    Other reaction(s): UNKNOWN Adhesive Other reaction(s): UNKNOWN Adhesive    Metabolic Disorder Labs: No results found for: HGBA1C, MPG No results found for: PROLACTIN No results found for: CHOL, TRIG, HDL, CHOLHDL, VLDL, LDLCALC No results found for: TSH  Therapeutic Level Labs: No results found for: LITHIUM No results found for: VALPROATE No components found for:  CBMZ  Current Medications: Current Outpatient Medications  Medication Sig Dispense Refill  . acetaminophen (TYLENOL) 500 MG tablet Take 1 tablet (500 mg total) by mouth every 6 (six) hours as needed for moderate pain. Also give 500 mg three times a day for 7  Days. 60 tablet 0  . albuterol (PROVENTIL HFA;VENTOLIN HFA) 108 (90 Base) MCG/ACT inhaler Inhale 2 puffs into the lungs every 6 (six) hours as needed for wheezing or shortness of breath. 1 Inhaler 2  . AMBULATORY NON FORMULARY MEDICATION Medication Name: incentive spirometry Use as directed 1 each 0  . buPROPion (WELLBUTRIN) 75 MG tablet Take 1 tablet (75 mg total) by mouth every morning. 90 tablet 1  . CALCIUM CITRATE PO Take by mouth.    . celecoxib (CELEBREX) 200 MG capsule TAKE 1 CAPSULE DAILY 90 capsule 3  . [facility] RANITIDINE 75 MG tablet Take 75 mg by mouth 2 (two) times daily as needed.   3  . Cyanocobalamin (VITAMIN B12) 500 MCG TABS Take by mouth.    . diphenoxylate-atropine (LOMOTIL) 2.5-0.025 MG tablet Take by mouth. Take 1 tablet by mouth 2 (two) times daily as needed for Diarrhea As need abdominal pain/diarrhea.    . doxycycline (VIBRA-TABS) 100 MG tablet Take 1 tablet (100 mg total) by mouth 2 (two)  times daily. 20 tablet 0  . fluticasone (FLONASE) 50 MCG/ACT nasal spray USE 2 SPRAYS NASALLY DAILY 48 g 2  . furosemide (LASIX) 40 MG tablet TAKE 1 TABLET DAILY 90 tablet 4  . levocetirizine (XYZAL) 5 MG tablet TAKE 1 TABLET AT BEDTIME EVERY NIGHT 90 tablet 4  . levothyroxine (SYNTHROID, LEVOTHROID) 50 MCG tablet TAKE 1 TABLET DAILY 90 tablet 4  . lidocaine (LIDODERM) 5 % Place 1 patch onto the skin daily. Remove & Discard patch within 12 hours or as directed by MD 30 patch 5  . loperamide (IMODIUM) 2 MG capsule Take 2 mg by mouth as needed  for diarrhea or loose stools.    . magnesium gluconate (MAGONATE) 500 MG tablet Take 400 mg by mouth daily.     . meclizine (ANTIVERT) 12.5 MG tablet Take 12.5 mg by mouth every 4 (four) hours as needed for dizziness.    . montelukast (SINGULAIR) 10 MG tablet     . montelukast (SINGULAIR) 10 MG tablet TAKE 1 TABLET DAILY 90 tablet 4  . Multiple Vitamin (MULTIVITAMIN) tablet Take 1 tablet by mouth daily.    . OSCIMIN 0.125 MG tablet TAKE 1 TABLET TWICE A DAY 180 tablet 3  . pantoprazole (PROTONIX) 20 MG tablet Take 1 tablet (20 mg total) by mouth daily. 90 tablet 1  . predniSONE (DELTASONE) 20 MG tablet One pill twice daily for 5 days 10 tablet 0  . Probiotic Product (PROBIOTIC COLON SUPPORT PO) Take 1 capsule by mouth daily.    . simvastatin (ZOCOR) 20 MG tablet TAKE 1 TABLET DAILY 90 tablet 4  . spironolactone (ALDACTONE) 25 MG tablet TAKE 1 TABLET DAILY 90 tablet 1  . sucralfate (CARAFATE) 1 g tablet May take before meals 21 tablet 0  . tamsulosin (FLOMAX) 0.4 MG CAPS capsule TAKE 1 CAPSULE DAILY 90 capsule 4  . tiotropium (SPIRIVA HANDIHALER) 18 MCG inhalation capsule Place 1 capsule (18 mcg total) into inhaler and inhale daily. 90 capsule 3  . traZODone (DESYREL) 50 MG tablet TAKE ONE-HALF (1/2) TO ONE TABLET AT BEDTIME FOR SLEEP 90 tablet 4  . triamcinolone cream (KENALOG) 0.1 % APP BID UNTIL CLEAR/FLAT  0  . venlafaxine XR (EFFEXOR XR) 37.5 MG 24 hr  capsule Take 1 capsule (37.5 mg total) by mouth daily. To be taken with 150 mg 90 capsule 1  . venlafaxine XR (EFFEXOR-XR) 150 MG 24 hr capsule Take 1 capsule (150 mg total) by mouth daily. To be taken with 37.5 mg 90 capsule 1  . Wheat Dextrin (BENEFIBER DRINK MIX PO) Take by mouth.     No current facility-administered medications for this visit.      Musculoskeletal: Strength & Muscle Tone: within normal limits Gait & Station: walks with walker Patient leans: Front  Psychiatric Specialty Exam: Review of Systems  Psychiatric/Behavioral: Positive for depression. The patient has insomnia.   All other systems reviewed and are negative.   Blood pressure (!) 146/70, pulse 98, temperature 97.7 F (36.5 C), temperature source Oral, weight 122 lb (55.3 kg).Body mass index is 23.83 kg/m.  General Appearance: Casual  Eye Contact:  Fair  Speech:  Clear and Coherent  Volume:  Normal  Mood:  Dysphoric  Affect:  Congruent  Thought Process:  Goal Directed and Descriptions of Associations: Intact  Orientation:  Full (Time, Place, and Person)  Thought Content: Logical   Suicidal Thoughts:  No  Homicidal Thoughts:  No  Memory:  Immediate;   Fair Recent;   Fair Remote;   Fair  Judgement:  Fair  Insight:  Fair  Psychomotor Activity:  Normal  Concentration:  Concentration: Fair and Attention Span: Fair  Recall:  [employer] of Knowledge: Fair  Language: Fair  Akathisia:  No  Handed:  Right  AIMS (if indicated): na  Assets:  Communication Skills Desire for Improvement Social Support  ADL's:  Intact  Cognition: WNL  Sleep:  Poor   Screenings: PHQ2-9     Procedure visit from 09/04/2015 in [place] Procedure visit from 10/03/2014 in [place] Office Visit from 09/21/2014 in [place]  [facility]  PHQ-2 Total Score  0  0  1       Assessment and Plan: Sandra Brown  is an 84 year old Caucasian female who has a history of depression, COPD, hypothyroidism, hypertension, presented to the clinic today for a follow-up visit.  Patient continues to struggle with some mood symptoms, sleep, fatigue.  She is currently recovering from cellulitis as well as bronchiolitis.  This could also be contributing to her recent fatigue.  Discussed sleep medications however she declines.  Will continue plan as noted below.   Plan MDD Continue Effexor 187.5 mg p.o. daily Wellbutrin 75 mg p.o. daily in the morning.  For insomnia Discussed melatonin.  Discussed with her to take 3-6 mg 90 minutes before bedtime. Patient sleeps very late usually at 2 AM.  She reports she sleeps 7 hours per night.  However most recently due to her multiple medical problems her sleep has been restless.  She reports she has been using aromatherapy which has been helpful. Epworth sleep scale done on 03/21/2017-7.  Follow-up in clinic in 6 weeks or sooner if needed.  More than 50 % of the time was spent for psychoeducation and supportive psychotherapy and care coordination.  This note was generated in part or whole with voice recognition software. Voice recognition is usually quite accurate but there are transcription errors that can and very often do occur. I apologize for any typographical errors that were not detected and corrected.      Ursula Alert, MD 11/11/2017, 11:20 AM

## 2017-11-11 NOTE — Patient Instructions (Signed)
Start Melatonin 3 mg to 6 mg at bedtime. Take it 90 minutes prior to bedtime , take it everyday .

## 2017-11-21 ENCOUNTER — Ambulatory Visit: Payer: Medicare Other | Admitting: Podiatry

## 2017-11-24 ENCOUNTER — Telehealth: Payer: Self-pay | Admitting: Family Medicine

## 2017-11-24 ENCOUNTER — Other Ambulatory Visit: Payer: Self-pay

## 2017-11-24 NOTE — Telephone Encounter (Signed)
Have her come in so I can decide which way to go with this.

## 2017-11-24 NOTE — Telephone Encounter (Signed)
Patient last seen 11/04/17, please review note and advise if patient needs to RTC. KW

## 2017-11-24 NOTE — Telephone Encounter (Signed)
Cellulitis in left leg is still red that has been treated over a month. Pt wanting to let [name] know.  Please advise.  Thanks, [employer]

## 2017-11-25 ENCOUNTER — Encounter: Payer: Self-pay | Admitting: Family Medicine

## 2017-11-25 ENCOUNTER — Ambulatory Visit (INDEPENDENT_AMBULATORY_CARE_PROVIDER_SITE_OTHER): Payer: Medicare Other | Admitting: Family Medicine

## 2017-11-25 VITALS — BP 104/50 | HR 66 | Temp 97.6°F | Resp 15 | Wt 119.4 lb

## 2017-11-25 DIAGNOSIS — J441 Chronic obstructive pulmonary disease with (acute) exacerbation: Secondary | ICD-10-CM

## 2017-11-25 DIAGNOSIS — I872 Venous insufficiency (chronic) (peripheral): Secondary | ICD-10-CM

## 2017-11-25 DIAGNOSIS — B372 Candidiasis of skin and nail: Secondary | ICD-10-CM | POA: Diagnosis not present

## 2017-11-25 MED ORDER — PREDNISONE 20 MG PO TABS: ORAL_TABLET | ORAL | 0 refills | Status: DC

## 2017-11-25 MED ORDER — NYSTATIN 100000 UNIT/GM EX CREA: 1.0000 "application " | TOPICAL_CREAM | Freq: Two times a day (BID) | CUTANEOUS | 0 refills | Status: DC

## 2017-11-25 MED ORDER — CEFDINIR 300 MG PO CAPS: 300.0000 mg | ORAL_CAPSULE | Freq: Two times a day (BID) | ORAL | 0 refills | Status: DC

## 2017-11-25 NOTE — Telephone Encounter (Signed)
Spoke with patient and scheduled appt for today. KW

## 2017-11-25 NOTE — Progress Notes (Signed)
  Subjective:     Patient ID: Sandra Brown, female   DOB: 1926/09/01, 84 y.o.   MRN: [id number] Chief Complaint  Patient presents with  . Cellulitis    Patient returns back to office to follow up for cellulitis, patient states that she has been wearing compression stockings. Patient states that she still has redness and swelling in the leg, for the past week patient also reports that she has had pain in her feet.  Marland Kitchen COPD    Patient would like to address productive cough with mucous that has been occuring for one week. Patient states that she has sbeen using Mucinex D and her inhaler.    HPI States she continues to have purulent sputum and wheezing despite course of doxycycline. Has developed an itchy rash underneath her breasts. Left leg swelling has resolved with the use of compression stockings but erythema has returned.  Review of Systems     Objective:   Physical Exam  Constitutional: She appears well-developed and well-nourished. No distress.  Pulmonary/Chest: Effort normal. She has wheezes (left posterior inspiratory wheezing).  Skin:  Left leg with confluent erythema and raised papules. Woody texture without pitting edema. There are a few scattered papules below her breasts.       Assessment:    1. COPD exacerbation (HCC) - predniSONE (DELTASONE) 20 MG tablet; One pill twice daily for 5 days  Dispense: 10 tablet; Refill: 0 - cefdinir (OMNICEF) 300 MG capsule; Take 1 capsule (300 mg total) by mouth 2 (two) times daily.  Dispense: 20 capsule; Refill: 0  2. Venous stasis dermatitis of left lower extremity: start TAC cream she has previously been prescribed.  3. Candidal intertrigo - nystatin cream (MYCOSTATIN); Apply 1 application topically 2 (two) times daily.  Dispense: 30 g; Refill: 0    Plan:    Further f/u in not improving. Consider dermatology referral for leg if not improving.

## 2017-11-25 NOTE — Patient Instructions (Addendum)
Start the triamcinolone cream twice daily for your legs. Let me know if leg still not improving  for a referral.

## 2017-11-26 ENCOUNTER — Other Ambulatory Visit: Payer: Self-pay | Admitting: Family Medicine

## 2017-11-26 MED ORDER — MAGIC MOUTHWASH: ORAL | 0 refills | Status: DC

## 2017-11-30 ENCOUNTER — Other Ambulatory Visit: Payer: Self-pay | Admitting: Family Medicine

## 2017-11-30 DIAGNOSIS — R600 Localized edema: Secondary | ICD-10-CM

## 2017-12-16 ENCOUNTER — Telehealth: Payer: Self-pay | Admitting: Family Medicine

## 2017-12-16 NOTE — Telephone Encounter (Signed)
Please advise 

## 2017-12-16 NOTE — Telephone Encounter (Signed)
Have her make an appointment this week. Will probably have to order stool tests and I will need to look at her leg again.

## 2017-12-16 NOTE — Telephone Encounter (Signed)
Pt was on 4 rounds of antibiotics and finished the antibiotic 2 weeks ago.  Pt is having diarrhea.   Prednisone was working.  Needing to know what is next to try to resolve the cellulitis.  Please advise.  Thanks, [employer]

## 2017-12-16 NOTE — Telephone Encounter (Signed)
Tried to call pt/daughter and left vm for her to return call.  dbs

## 2017-12-17 NOTE — Telephone Encounter (Signed)
Spoke to daughter and scheduled appt for fup.  dbs

## 2017-12-18 ENCOUNTER — Other Ambulatory Visit: Payer: Self-pay

## 2017-12-18 ENCOUNTER — Encounter: Payer: Self-pay | Admitting: Family Medicine

## 2017-12-18 ENCOUNTER — Ambulatory Visit (INDEPENDENT_AMBULATORY_CARE_PROVIDER_SITE_OTHER): Payer: Medicare Other | Admitting: Family Medicine

## 2017-12-18 VITALS — BP 110/50 | HR 82 | Temp 97.7°F | Ht 60.0 in | Wt 118.2 lb

## 2017-12-18 DIAGNOSIS — R600 Localized edema: Secondary | ICD-10-CM | POA: Diagnosis not present

## 2017-12-18 DIAGNOSIS — I872 Venous insufficiency (chronic) (peripheral): Secondary | ICD-10-CM

## 2017-12-18 NOTE — Progress Notes (Signed)
  Subjective:     Patient ID: Sandra Brown, female   DOB: 1926-03-11, 84 y.o.   MRN: [id number] Chief Complaint  Patient presents with  . Cellulitis    fup.  still red.  swelling is better and gone down.  . Diarrhea   HPI States diarrhea resolved two days ago-suspect side effect from abx use not colitis. Left leg swelling has improved with the use of pressure stockings but erythema waxes and wanes. She has had multiple course of abx with transient improvement. Accompanied by her daughter today.  Review of Systems     Objective:   Physical Exam Constitutional:      General: She is not in acute distress.    Appearance: Normal appearance.  Musculoskeletal:        General: No swelling.  Skin:    Comments: Left lower extremity from knee distally remains mildly erythematous, non-tender with woody texture.  Neurological:     Mental Status: She is alert.        Assessment:    1. Venous stasis dermatitis of left lower extremity - Ambulatory referral to Vascular Surgery  2. Leg edema, left - Ambulatory referral to Vascular Surgery    Plan:    Encouraged patient to forward last labs to me.

## 2017-12-18 NOTE — Patient Instructions (Signed)
You will get a call about the vascular surgery referral. Please check in the status of your lab work.

## 2017-12-19 ENCOUNTER — Encounter: Payer: Self-pay | Admitting: Podiatry

## 2017-12-19 ENCOUNTER — Ambulatory Visit (INDEPENDENT_AMBULATORY_CARE_PROVIDER_SITE_OTHER): Payer: Medicare Other | Admitting: Podiatry

## 2017-12-19 DIAGNOSIS — B351 Tinea unguium: Secondary | ICD-10-CM | POA: Diagnosis not present

## 2017-12-19 DIAGNOSIS — M79676 Pain in unspecified toe(s): Secondary | ICD-10-CM | POA: Diagnosis not present

## 2017-12-22 ENCOUNTER — Encounter (INDEPENDENT_AMBULATORY_CARE_PROVIDER_SITE_OTHER): Payer: Medicare Other | Admitting: Vascular Surgery

## 2017-12-23 ENCOUNTER — Ambulatory Visit (INDEPENDENT_AMBULATORY_CARE_PROVIDER_SITE_OTHER): Payer: Medicare Other | Admitting: Vascular Surgery

## 2017-12-23 ENCOUNTER — Encounter (INDEPENDENT_AMBULATORY_CARE_PROVIDER_SITE_OTHER): Payer: Self-pay | Admitting: Vascular Surgery

## 2017-12-23 VITALS — BP 107/53 | HR 92 | Resp 22 | Ht 60.0 in | Wt 116.2 lb

## 2017-12-23 DIAGNOSIS — L03116 Cellulitis of left lower limb: Secondary | ICD-10-CM | POA: Diagnosis not present

## 2017-12-23 DIAGNOSIS — N183 Chronic kidney disease, stage 3 unspecified: Secondary | ICD-10-CM

## 2017-12-23 DIAGNOSIS — K219 Gastro-esophageal reflux disease without esophagitis: Secondary | ICD-10-CM | POA: Diagnosis not present

## 2017-12-23 DIAGNOSIS — E782 Mixed hyperlipidemia: Secondary | ICD-10-CM | POA: Diagnosis not present

## 2017-12-23 DIAGNOSIS — R600 Localized edema: Secondary | ICD-10-CM | POA: Diagnosis not present

## 2017-12-23 DIAGNOSIS — M7989 Other specified soft tissue disorders: Secondary | ICD-10-CM

## 2017-12-23 NOTE — Progress Notes (Signed)
Patient ID: Sandra Brown, female   DOB: [birth year], 84 y.o.   MRN: [id number]  Chief Complaint  Patient presents with  . New Patient (Initial Visit)    HPI Sandra Brown is a 84 y.o. female.  I am asked to see the patient by R. Chauvin, PA-C for evaluation of left leg swelling and recurrent cellulitis.  The patient reports an episode a couple of years ago with bilateral lower extremity cellulitis.  Since that time, she has had intermittent swelling.  Her right leg seems to be doing fairly well.  Her left leg had an episode of cellulitis several weeks ago and there has remained redness and swelling in the leg since that time.  No fevers or chills.  The first time she had cellulitis, the legs had weeping and drainage but that did not happen this time.  This is only mildly uncomfortable currently.  She has not had any DVT or superficial thrombophlebitis to her knowledge.  She does describe some pain intermittently in her toes and heel particularly at night.  This is in the left leg as well.   Past Medical History:  Diagnosis Date  . Cataract   . COPD (chronic obstructive pulmonary disease) ([place])   . Depression   . Difficulty swallowing   . Frequent headaches   . Hearing loss   . Hypertension   . Hypothyroidism   . Reflux     Past Surgical History:  Procedure Laterality Date  . ABDOMINAL HYSTERECTOMY    . APPENDECTOMY    . LUMBAR LAMINECTOMY    . PARATHYROIDECTOMY    . TOTAL HIP ARTHROPLASTY     x 4    Family History  Problem Relation Age of Onset  . Stroke Mother   . Hypertension Mother   . Heart disease Father   . Hypertension Father   no bleeding or clotting disorders  Social History Social History   Tobacco Use  . Smoking status: Never Smoker  . Smokeless tobacco: Never Used  . Tobacco comment: quit 1954  Substance Use Topics  . Alcohol use: No  . Drug use: No     Allergies  Allergen Reactions  . Sulfa Antibiotics Rash and Itching    Other  reaction(s): Diarrhea and vomiting (finding)  . Erythromycin Nausea And Vomiting    Other reaction(s): Diarrhea and vomiting (finding)  . Aspirin Other (See Comments), Tinitus and Nausea And Vomiting    Ringing of the ears, caused hearing loss both ears Ringing in ears  . Contrast Media [Iodinated Diagnostic Agents] Rash and Hives  . Latex Rash and Itching  . Penicillin G Rash  . Penicillins Rash    Other reaction(s): UNKNOWN  . Tape Rash    Other reaction(s): UNKNOWN Adhesive Other reaction(s): UNKNOWN Adhesive    Current Outpatient Medications  Medication Sig Dispense Refill  . acetaminophen (TYLENOL) 500 MG tablet Take 1 tablet (500 mg total) by mouth every 6 (six) hours as needed for moderate pain. Also give 500 mg three times a day for 7  Days. 60 tablet 0  . albuterol (PROVENTIL HFA;VENTOLIN HFA) 108 (90 Base) MCG/ACT inhaler Inhale 2 puffs into the lungs every 6 (six) hours as needed for wheezing or shortness of breath. 1 Inhaler 2  . AMBULATORY NON FORMULARY MEDICATION Medication Name: incentive spirometry Use as directed 1 each 0  . buPROPion (WELLBUTRIN) 75 MG tablet Take 1 tablet (75 mg total) by mouth every morning. 90 tablet 1  . CALCIUM  CITRATE PO Take by mouth.    . celecoxib (CELEBREX) 200 MG capsule TAKE 1 CAPSULE DAILY 90 capsule 3  . [facility]S RANITIDINE 75 MG tablet Take 75 mg by mouth 2 (two) times daily as needed.   3  . Cyanocobalamin (VITAMIN B12) 500 MCG TABS Take by mouth.    . diphenoxylate-atropine (LOMOTIL) 2.5-0.025 MG tablet Take by mouth. Take 1 tablet by mouth 2 (two) times daily as needed for Diarrhea As need abdominal pain/diarrhea.    . fluticasone (FLONASE) 50 MCG/ACT nasal spray USE 2 SPRAYS NASALLY DAILY 48 g 2  . furosemide (LASIX) 40 MG tablet TAKE 1 TABLET DAILY 90 tablet 4  . levocetirizine (XYZAL) 5 MG tablet TAKE 1 TABLET AT BEDTIME EVERY NIGHT 90 tablet 4  . levothyroxine (SYNTHROID, LEVOTHROID) 50 MCG tablet TAKE 1 TABLET DAILY 90 tablet 4  .  lidocaine (LIDODERM) 5 % Place 1 patch onto the skin daily. Remove & Discard patch within 12 hours or as directed by MD 30 patch 5  . loperamide (IMODIUM) 2 MG capsule Take 2 mg by mouth as needed for diarrhea or loose stools.    . magic mouthwash SOLN Swish and spit 5 ml twice daily until healed 240 mL 0  . magnesium gluconate (MAGONATE) 500 MG tablet Take 400 mg by mouth daily.     . meclizine (ANTIVERT) 12.5 MG tablet Take 12.5 mg by mouth every 4 (four) hours as needed for dizziness.    . Multiple Vitamin (MULTIVITAMIN) tablet Take 1 tablet by mouth daily.    Marland Kitchen nystatin cream (MYCOSTATIN) Apply 1 application topically 2 (two) times daily. 30 g 0  . OSCIMIN 0.125 MG tablet TAKE 1 TABLET TWICE A DAY 180 tablet 3  . pantoprazole (PROTONIX) 20 MG tablet Take 1 tablet (20 mg total) by mouth daily. 90 tablet 1  . Probiotic Product (PROBIOTIC COLON SUPPORT PO) Take 1 capsule by mouth daily.    . simvastatin (ZOCOR) 20 MG tablet TAKE 1 TABLET DAILY 90 tablet 4  . spironolactone (ALDACTONE) 25 MG tablet TAKE 1 TABLET DAILY 90 tablet 4  . sucralfate (CARAFATE) 1 g tablet May take before meals 21 tablet 0  . tamsulosin (FLOMAX) 0.4 MG CAPS capsule TAKE 1 CAPSULE DAILY 90 capsule 4  . tiotropium (SPIRIVA HANDIHALER) 18 MCG inhalation capsule Place 1 capsule (18 mcg total) into inhaler and inhale daily. 90 capsule 3  . triamcinolone cream (KENALOG) 0.1 % APP BID UNTIL CLEAR/FLAT  0  . venlafaxine XR (EFFEXOR XR) 37.5 MG 24 hr capsule Take 1 capsule (37.5 mg total) by mouth daily. To be taken with 150 mg 90 capsule 1  . venlafaxine XR (EFFEXOR-XR) 150 MG 24 hr capsule Take 1 capsule (150 mg total) by mouth daily. To be taken with 37.5 mg 90 capsule 1  . Wheat Dextrin (BENEFIBER DRINK MIX PO) Take by mouth.     No current facility-administered medications for this visit.       REVIEW OF SYSTEMS (Negative unless checked)  Constitutional: [] Weight loss  [] Fever  [] Chills Cardiac: [] Chest pain    [] Chest pressure   [] Palpitations   [] Shortness of breath when laying flat   [] Shortness of breath at rest   [] Shortness of breath with exertion. Vascular:  [] Pain in legs with walking   [] Pain in legs at rest   [] Pain in legs when laying flat   [] Claudication   [] Pain in feet when walking  [] Pain in feet at rest  [] Pain in feet when  laying flat   [] History of DVT   [] Phlebitis   [x] Swelling in legs   [] Varicose veins   [] Non-healing ulcers Pulmonary:   [] Uses home oxygen   [] Productive cough   [] Hemoptysis   [] Wheeze  [x] COPD   [] Asthma Neurologic:  [] Dizziness  [] Blackouts   [] Seizures   [] History of stroke   [] History of TIA  [] Aphasia   [] Temporary blindness   [] Dysphagia   [] Weakness or numbness in arms   [] Weakness or numbness in legs Musculoskeletal:  [x] Arthritis   [] Joint swelling   [] Joint pain   [] Low back pain Hematologic:  [] Easy bruising  [] Easy bleeding   [] Hypercoagulable state   [] Anemic  [] Hepatitis Gastrointestinal:  [] Blood in stool   [] Vomiting blood  [x] Gastroesophageal reflux/heartburn   [] Abdominal pain Genitourinary:  [] Chronic kidney disease   [] Difficult urination  [] Frequent urination  [] Burning with urination   [] Hematuria Skin:  [] Rashes   [] Ulcers   [] Wounds Psychological:  [] History of anxiety   []  History of major depression.    Physical Exam BP (!) 107/53 (BP Location: Right Arm, Patient Position: Sitting)   Pulse 92   Resp (!) 22   Ht 5' (1.524 m)   Wt 116 lb 3.2 oz (52.7 kg)   BMI 22.69 kg/m  Gen:  WD/WN, NAD.  Appears younger than stated age  Head: [place]/AT, No temporalis wasting.  Ear/Nose/Throat: Hearing diminished, nares w/o erythema or drainage, oropharynx w/o Erythema/Exudate Eyes: Conjunctiva clear, sclera non-icteric  Neck: trachea midline.   Pulmonary:  Good air movement, respirations not labored, no use of accessory muscles Cardiac: RRR, no JVD Vascular:  Vessel Right Left  Radial Palpable Palpable                          PT  1+  palpable  trace palpable  DP  1+ palpable  1+ palpable   Gastrointestinal: soft, non-tender/non-distended.  Musculoskeletal: M/S 5/5 throughout.  Extremities without ischemic changes.  No deformity or atrophy.  No significant right lower extremity edema, 1+ left lower extremity edema.  Moderate stasis dermatitis changes are present in the left leg with some mild erythema present as well.  No open ulcerations or infection.  Walks with a walker Neurologic: Sensation grossly intact in extremities.  Symmetrical.  Speech is fluent. Motor exam as listed above. Psychiatric: Judgment intact, Mood & affect appropriate for pt's clinical situation. Dermatologic: No rashes or ulcers noted.  No cellulitis or open wounds.    Radiology No results found.  Labs No results found for this or any previous visit (from the past 2160 hour(s)).  Assessment/Plan:  Hyperlipemia lipid control important in reducing the progression of atherosclerotic disease. Continue statin therapy   CKD (chronic kidney disease) stage 3, GFR 91-47 ml/min Certainly could contribute to lower extremity swelling.  GERD (gastroesophageal reflux disease) Continue PPI as already ordered, this medication has been reviewed and there are no changes at this time.  Avoidence of caffeine and alcohol  Moderate elevation of the head of the bed   Cellulitis of leg, left Very slow to heal and recurrent.  The patient describes 2 previous episodes.  Continue compression and elevation.  May benefit from a lymphedema pump depending on the results of her venous work-up.  Given her age and atherosclerotic risk factors I think it would also be prudent to check ABIs to ensure that arterial perfusion is adequate as well.  Swelling of limb The patient likely has lower extremity lymphedema from chronic  scarring and lymphatic channels after previous cellulitis.  Interestingly, it is affected only the left leg and not the right leg.  She is already  wearing compression stockings.  A venous reflux study will be performed at her convenience in the near future.  Leg elevation and increasing activity also benefit.  Return after her noninvasive studies to discuss the results and determine further treatment options.      Leotis Pain 12/23/2017, 4:29 PM   This note was created with Dragon medical transcription system.  Any errors from dictation are unintentional.

## 2017-12-23 NOTE — Assessment & Plan Note (Addendum)
Very slow to heal and recurrent.  The patient describes 2 previous episodes.  Continue compression and elevation.  May benefit from a lymphedema pump depending on the results of her venous work-up.  Given her age and atherosclerotic risk factors I think it would also be prudent to check ABIs to ensure that arterial perfusion is adequate as well.

## 2017-12-23 NOTE — Progress Notes (Signed)
   SUBJECTIVE Patient presents to office today complaining of elongated, thickened nails that cause pain while ambulating in shoes. She is unable to trim her own nails. Patient is here for further evaluation and treatment.  Past Medical History:  Diagnosis Date  . Cataract   . COPD (chronic obstructive pulmonary disease) ([place])   . Depression   . Difficulty swallowing   . Frequent headaches   . Hearing loss   . Hypertension   . Hypothyroidism   . Reflux     OBJECTIVE General Patient is awake, alert, and oriented x 3 and in no acute distress. Derm Skin is dry and supple bilateral. Negative open lesions or macerations. Remaining integument unremarkable. Nails are tender, long, thickened and dystrophic with subungual debris, consistent with onychomycosis, 1-5 bilateral. No signs of infection noted. Vasc  DP and PT pedal pulses palpable bilaterally. Temperature gradient within normal limits.  Neuro Epicritic and protective threshold sensation grossly intact bilaterally.  Musculoskeletal Exam No symptomatic pedal deformities noted bilateral. Muscular strength within normal limits.  ASSESSMENT 1. Onychodystrophic nails 1-5 bilateral with hyperkeratosis of nails.  2. Onychomycosis of nail due to dermatophyte bilateral 3. Pain in foot bilateral  PLAN OF CARE 1. Patient evaluated today.  2. Instructed to maintain good pedal hygiene and foot care.  3. Mechanical debridement of nails 1-5 bilaterally performed using a nail nipper. Filed with dremel without incident.  4. Return to clinic in 3 mos.    Edrick Kins, DPM [facility]  Dr. Edrick Kins, [address]                                        [place], [place] [address]                Office [phone]  Fax [phone]

## 2017-12-23 NOTE — Assessment & Plan Note (Signed)
lipid control important in reducing the progression of atherosclerotic disease. Continue statin therapy  

## 2017-12-23 NOTE — Assessment & Plan Note (Signed)
The patient likely has lower extremity lymphedema from chronic scarring and lymphatic channels after previous cellulitis.  Interestingly, it is affected only the left leg and not the right leg.  She is already wearing compression stockings.  A venous reflux study will be performed at her convenience in the near future.  Leg elevation and increasing activity also benefit.  Return after her noninvasive studies to discuss the results and determine further treatment options.

## 2017-12-23 NOTE — Assessment & Plan Note (Signed)
Continue PPI as already ordered, this medication has been reviewed and there are no changes at this time. Avoidence of caffeine and alcohol Moderate elevation of the head of the bed  

## 2017-12-23 NOTE — Patient Instructions (Signed)

## 2017-12-23 NOTE — Assessment & Plan Note (Signed)
Certainly could contribute to lower extremity swelling.

## 2017-12-25 NOTE — Progress Notes (Signed)
[place] MD OP Progress Note  12/26/2017 12:03 PM Sandra Brown  MRN:  [id number]  Chief Complaint:  Chief Complaint    Follow-up; Medication Refill    ' I am here for follow up."  HPI: Sandra Brown is a 84 year old Caucasian female, widowed, lives at [facility], assisted living facility, presented to the clinic today for a follow-up visit.  She has a history of depression, sleep problems, COPD, GERD, hypothyroidism recent cellulitis.   Pt presented with her daughter Sandra Brown.  Patient continues to struggle with cellulitis of her lower extremities.  She has continues to be tired most of the time.  She reports she tried multiple antibiotics and currently has upcoming follow-up visit scheduled with vascular provider.  Patient reports sleep continues to be restless.  Discussed adding trazodone as needed.  She is currently compliant on her Wellbutrin and Effexor.  She continues to feel tired during the day.  However discussed with patient as well as daughter her tiredness could also be due to her cellulitis and other medical problems.  Patient looks forward to the holidays.  She currently denies any suicidality or perceptual disturbances.  Visit Diagnosis:    ICD-10-CM   1. MDD (major depressive disorder), recurrent episode, mild ([place]) F33.0   2. Insomnia due to mental condition F51.05     Past Psychiatric History: Reviewed past psychiatric history from my progress note on 03/21/2017  Past Medical History:  Past Medical History:  Diagnosis Date  . Cataract   . COPD (chronic obstructive pulmonary disease) ([place])   . Depression   . Difficulty swallowing   . Frequent headaches   . Hearing loss   . Hypertension   . Hypothyroidism   . Reflux     Past Surgical History:  Procedure Laterality Date  . ABDOMINAL HYSTERECTOMY    . APPENDECTOMY    . LUMBAR LAMINECTOMY    . PARATHYROIDECTOMY    . TOTAL HIP ARTHROPLASTY     x 4    Family Psychiatric History: Reviewed family psychiatric  history from my progress note on 03/21/2017  Family History:  Family History  Problem Relation Age of Onset  . Stroke Mother   . Hypertension Mother   . Heart disease Father   . Hypertension Father     Social History: Reviewed social history from my progress note on 03/21/2017 Social History   Socioeconomic History  . Marital status: Widowed    Spouse name: Not on file  . Number of children: Not on file  . Years of education: Not on file  . Highest education level: Not on file  Occupational History  . Not on file  Social Needs  . Financial resource strain: Not on file  . Food insecurity:    Worry: Not on file    Inability: Not on file  . Transportation needs:    Medical: Not on file    Non-medical: Not on file  Tobacco Use  . Smoking status: Never Smoker  . Smokeless tobacco: Never Used  . Tobacco comment: quit 1954  Substance and Sexual Activity  . Alcohol use: No  . Drug use: No  . Sexual activity: Never  Lifestyle  . Physical activity:    Days per week: Not on file    Minutes per session: Not on file  . Stress: Not on file  Relationships  . Social connections:    Talks on phone: Not on file    Gets together: Not on file    Attends religious service:  Not on file    Active member of club or organization: Not on file    Attends meetings of clubs or organizations: Not on file    Relationship status: Not on file  Other Topics Concern  . Not on file  Social History Narrative  . Not on file    Allergies:  Allergies  Allergen Reactions  . Sulfa Antibiotics Rash and Itching    Other reaction(s): Diarrhea and vomiting (finding)  . Erythromycin Nausea And Vomiting    Other reaction(s): Diarrhea and vomiting (finding)  . Aspirin Other (See Comments), Tinitus and Nausea And Vomiting    Ringing of the ears, caused hearing loss both ears Ringing in ears  . Contrast Media [Iodinated Diagnostic Agents] Rash and Hives  . Latex Rash and Itching  . Penicillin G Rash   . Penicillins Rash    Other reaction(s): UNKNOWN  . Tape Rash    Other reaction(s): UNKNOWN Adhesive Other reaction(s): UNKNOWN Adhesive    Metabolic Disorder Labs: No results found for: HGBA1C, MPG No results found for: PROLACTIN No results found for: CHOL, TRIG, HDL, CHOLHDL, VLDL, LDLCALC No results found for: TSH  Therapeutic Level Labs: No results found for: LITHIUM No results found for: VALPROATE No components found for:  CBMZ  Current Medications: Current Outpatient Medications  Medication Sig Dispense Refill  . acetaminophen (TYLENOL) 500 MG tablet Take 1 tablet (500 mg total) by mouth every 6 (six) hours as needed for moderate pain. Also give 500 mg three times a day for 7  Days. 60 tablet 0  . albuterol (PROVENTIL HFA;VENTOLIN HFA) 108 (90 Base) MCG/ACT inhaler Inhale 2 puffs into the lungs every 6 (six) hours as needed for wheezing or shortness of breath. 1 Inhaler 2  . AMBULATORY NON FORMULARY MEDICATION Medication Name: incentive spirometry Use as directed 1 each 0  . buPROPion (WELLBUTRIN) 75 MG tablet Take 1 tablet (75 mg total) by mouth every morning. 90 tablet 1  . CALCIUM CITRATE PO Take by mouth.    . celecoxib (CELEBREX) 200 MG capsule TAKE 1 CAPSULE DAILY 90 capsule 3  . CVS RANITIDINE 75 MG tablet Take 75 mg by mouth 2 (two) times daily as needed.   3  . Cyanocobalamin (VITAMIN B12) 500 MCG TABS Take by mouth.    . diphenoxylate-atropine (LOMOTIL) 2.5-0.025 MG tablet Take by mouth. Take 1 tablet by mouth 2 (two) times daily as needed for Diarrhea As need abdominal pain/diarrhea.    . fluticasone (FLONASE) 50 MCG/ACT nasal spray USE 2 SPRAYS NASALLY DAILY 48 g 2  . furosemide (LASIX) 40 MG tablet TAKE 1 TABLET DAILY 90 tablet 4  . levocetirizine (XYZAL) 5 MG tablet TAKE 1 TABLET AT BEDTIME EVERY NIGHT 90 tablet 4  . levothyroxine (SYNTHROID, LEVOTHROID) 50 MCG tablet TAKE 1 TABLET DAILY 90 tablet 4  . lidocaine (LIDODERM) 5 % Place 1 patch onto the skin  daily. Remove & Discard patch within 12 hours or as directed by MD 30 patch 5  . loperamide (IMODIUM) 2 MG capsule Take 2 mg by mouth as needed for diarrhea or loose stools.    . magic mouthwash SOLN Swish and spit 5 ml twice daily until healed 240 mL 0  . magnesium gluconate (MAGONATE) 500 MG tablet Take 400 mg by mouth daily.     . meclizine (ANTIVERT) 12.5 MG tablet Take 12.5 mg by mouth every 4 (four) hours as needed for dizziness.    . Multiple Vitamin (MULTIVITAMIN) tablet Take 1 tablet  by mouth daily.    Marland Kitchen nystatin cream (MYCOSTATIN) Apply 1 application topically 2 (two) times daily. 30 g 0  . OSCIMIN 0.125 MG tablet TAKE 1 TABLET TWICE A DAY 180 tablet 3  . pantoprazole (PROTONIX) 20 MG tablet Take 1 tablet (20 mg total) by mouth daily. 90 tablet 1  . Probiotic Product (PROBIOTIC COLON SUPPORT PO) Take 1 capsule by mouth daily.    . simvastatin (ZOCOR) 20 MG tablet TAKE 1 TABLET DAILY 90 tablet 4  . spironolactone (ALDACTONE) 25 MG tablet TAKE 1 TABLET DAILY 90 tablet 4  . sucralfate (CARAFATE) 1 g tablet May take before meals 21 tablet 0  . tamsulosin (FLOMAX) 0.4 MG CAPS capsule TAKE 1 CAPSULE DAILY 90 capsule 4  . tiotropium (SPIRIVA HANDIHALER) 18 MCG inhalation capsule Place 1 capsule (18 mcg total) into inhaler and inhale daily. 90 capsule 3  . traZODone (DESYREL) 50 MG tablet Take 0.5-1 tablets (25-50 mg total) by mouth at bedtime. Sleep at 10:30 PM 90 tablet 0  . triamcinolone cream (KENALOG) 0.1 % APP BID UNTIL CLEAR/FLAT  0  . venlafaxine XR (EFFEXOR XR) 37.5 MG 24 hr capsule Take 1 capsule (37.5 mg total) by mouth daily. To be taken with 150 mg 90 capsule 1  . venlafaxine XR (EFFEXOR-XR) 150 MG 24 hr capsule Take 1 capsule (150 mg total) by mouth daily. To be taken with 37.5 mg 90 capsule 1  . Wheat Dextrin (BENEFIBER DRINK MIX PO) Take by mouth.     No current facility-administered medications for this visit.      Musculoskeletal: Strength & Muscle Tone: within normal  limits Gait & Station: walks with walker Patient leans: Front  Psychiatric Specialty Exam: Review of Systems  Psychiatric/Behavioral: The patient has insomnia.   All other systems reviewed and are negative.   Blood pressure 119/65, pulse (!) 107, temperature 97.8 F (36.6 C), temperature source Oral, weight 117 lb 12.8 oz (53.4 kg).Body mass index is 23.01 kg/m.  General Appearance: Casual  Eye Contact:  Fair  Speech:  Normal Rate  Volume:  Decreased  Mood:  Euthymic  Affect:  Appropriate  Thought Process:  Goal Directed and Descriptions of Associations: Intact  Orientation:  Full (Time, Place, and Person)  Thought Content: Logical   Suicidal Thoughts:  No  Homicidal Thoughts:  No  Memory:  Immediate;   Fair Recent;   Fair Remote;   Fair  Judgement:  Fair  Insight:  Fair  Psychomotor Activity:  Decreased  Concentration:  Concentration: Fair and Attention Span: Fair  Recall:  [employer] of Knowledge: Fair  Language: Fair  Akathisia:  No  Handed:  Right  AIMS (if indicated): denies rigidity,stiffness  Assets:  Communication Skills Desire for Improvement Social Support  ADL's:  Intact  Cognition: WNL  Sleep:  Poor   Screenings: PHQ2-9     Procedure visit from 09/04/2015 in [facility] Procedure visit from 10/03/2014 in [place] Office Visit from 09/21/2014 in [facility]  PHQ-2 Total Score  0  0  1       Assessment and Plan: Sandra Brown is a 84 year old Caucasian female who has a history of depression, COPD, hypothyroidism, hypertension, presented to the clinic today for a follow-up visit.  Patient is currently struggling with cellulitis.  She continues to have sleep problems.  We will continue medications as noted below.  Plan MDD Effexor 187.5 mg p.o.  daily Wellbutrin 75 mg p.o. daily  For insomnia Start trazodone 25 to 50 mg p.o.  nightly as needed  Follow-up in clinic in 2 months or sooner if needed  More than 50 % of the time was spent for psychoeducation and supportive psychotherapy and care coordination.  This note was generated in part or whole with voice recognition software. Voice recognition is usually quite accurate but there are transcription errors that can and very often do occur. I apologize for any typographical errors that were not detected and corrected.       Ursula Alert, MD 12/26/2017, 12:03 PM

## 2017-12-26 ENCOUNTER — Other Ambulatory Visit: Payer: Self-pay

## 2017-12-26 ENCOUNTER — Encounter: Payer: Self-pay | Admitting: Psychiatry

## 2017-12-26 ENCOUNTER — Ambulatory Visit (INDEPENDENT_AMBULATORY_CARE_PROVIDER_SITE_OTHER): Payer: Medicare Other | Admitting: Psychiatry

## 2017-12-26 VITALS — BP 119/65 | HR 107 | Temp 97.8°F | Wt 117.8 lb

## 2017-12-26 DIAGNOSIS — F330 Major depressive disorder, recurrent, mild: Secondary | ICD-10-CM

## 2017-12-26 DIAGNOSIS — F5105 Insomnia due to other mental disorder: Secondary | ICD-10-CM | POA: Diagnosis not present

## 2017-12-26 MED ORDER — TRAZODONE HCL 50 MG PO TABS: 25.0000 mg | ORAL_TABLET | Freq: Every day | ORAL | 0 refills | Status: DC

## 2017-12-26 NOTE — Patient Instructions (Signed)
Effexor XR ( Venlafaxine ) 187.5 mg has to be taken daily around 8- 9 AM .

## 2018-01-05 ENCOUNTER — Other Ambulatory Visit: Payer: Self-pay | Admitting: Family Medicine

## 2018-01-05 DIAGNOSIS — J309 Allergic rhinitis, unspecified: Secondary | ICD-10-CM

## 2018-01-05 MED ORDER — FLUTICASONE PROPIONATE 50 MCG/ACT NA SUSP: NASAL | 2 refills | Status: DC

## 2018-01-12 ENCOUNTER — Other Ambulatory Visit: Payer: Self-pay | Admitting: Family Medicine

## 2018-01-13 ENCOUNTER — Other Ambulatory Visit: Payer: Self-pay | Admitting: Family Medicine

## 2018-01-22 ENCOUNTER — Ambulatory Visit (INDEPENDENT_AMBULATORY_CARE_PROVIDER_SITE_OTHER): Payer: Medicare Other | Admitting: Nurse Practitioner

## 2018-01-22 ENCOUNTER — Ambulatory Visit (INDEPENDENT_AMBULATORY_CARE_PROVIDER_SITE_OTHER): Payer: Medicare Other

## 2018-01-22 ENCOUNTER — Encounter (INDEPENDENT_AMBULATORY_CARE_PROVIDER_SITE_OTHER): Payer: Self-pay | Admitting: Nurse Practitioner

## 2018-01-22 VITALS — BP 114/54 | HR 87 | Resp 16 | Ht 60.0 in | Wt 115.2 lb

## 2018-01-22 DIAGNOSIS — M7989 Other specified soft tissue disorders: Secondary | ICD-10-CM

## 2018-01-22 DIAGNOSIS — L03116 Cellulitis of left lower limb: Secondary | ICD-10-CM

## 2018-01-22 DIAGNOSIS — I890 Lymphedema, not elsewhere classified: Secondary | ICD-10-CM | POA: Diagnosis not present

## 2018-01-22 DIAGNOSIS — I872 Venous insufficiency (chronic) (peripheral): Secondary | ICD-10-CM | POA: Diagnosis not present

## 2018-01-27 ENCOUNTER — Encounter (INDEPENDENT_AMBULATORY_CARE_PROVIDER_SITE_OTHER): Payer: Self-pay | Admitting: Nurse Practitioner

## 2018-01-27 DIAGNOSIS — I872 Venous insufficiency (chronic) (peripheral): Secondary | ICD-10-CM | POA: Insufficient documentation

## 2018-01-27 DIAGNOSIS — I890 Lymphedema, not elsewhere classified: Secondary | ICD-10-CM | POA: Insufficient documentation

## 2018-01-27 NOTE — Progress Notes (Signed)
Subjective:    Patient ID: Sandra Brown, female    DOB: 1926-08-12, 85 y.o.   MRN: [id number] Chief Complaint  Patient presents with  . Follow-up    HPI  STEPHANINE Brown is a 85 y.o. female that was referred by Rae Roam, PA with concerns for recurrent cellulitis and lower extremity swelling.  Today her swelling is minimal with no signs of cellulitis. She reports recently beginning to wear medical grade one compression stockings, which she has not been wearing before.    She denies any precipitating events to the cellulitis, such as trauma or injury.  She denies any chest pain or shortness of breath.  She denies any fever, chills, diarrhea, or nausea.    She underwent bilateral ABIs with were 1.09 bilaterally.  She had triphasic tibial artery waveforms bilaterally.  There were also strong toe waveforms bilaterally, indicative of no significant lower extremity arterial disease.  There are no previous comparison studies.    She also underwent a left lower extremity venous reflux, which revealed no evidence of DVT or superficial thrombophlebitis.  There was an incidental finding of a bakers cyst behind her right knee.  She also has evidence of chronic venous insufficiency of her common femoral vein.   Past Medical History:  Diagnosis Date  . Cataract   . COPD (chronic obstructive pulmonary disease) ([place])   . Depression   . Difficulty swallowing   . Frequent headaches   . Hearing loss   . Hypertension   . Hypothyroidism   . Reflux     Past Surgical History:  Procedure Laterality Date  . ABDOMINAL HYSTERECTOMY    . APPENDECTOMY    . LUMBAR LAMINECTOMY    . PARATHYROIDECTOMY    . TOTAL HIP ARTHROPLASTY     x 4    Social History   Socioeconomic History  . Marital status: Widowed    Spouse name: Not on file  . Number of children: Not on file  . Years of education: Not on file  . Highest education level: Not on file  Occupational History  . Not on file  Social Needs    . Financial resource strain: Not on file  . Food insecurity:    Worry: Not on file    Inability: Not on file  . Transportation needs:    Medical: Not on file    Non-medical: Not on file  Tobacco Use  . Smoking status: Never Smoker  . Smokeless tobacco: Never Used  . Tobacco comment: quit 1954  Substance and Sexual Activity  . Alcohol use: No  . Drug use: No  . Sexual activity: Never  Lifestyle  . Physical activity:    Days per week: Not on file    Minutes per session: Not on file  . Stress: Not on file  Relationships  . Social connections:    Talks on phone: Not on file    Gets together: Not on file    Attends religious service: Not on file    Active member of club or organization: Not on file    Attends meetings of clubs or organizations: Not on file    Relationship status: Not on file  . Intimate partner violence:    Fear of current or ex partner: Not on file    Emotionally abused: Not on file    Physically abused: Not on file    Forced sexual activity: Not on file  Other Topics Concern  . Not on file  Social  History Narrative  . Not on file    Family History  Problem Relation Age of Onset  . Stroke Mother   . Hypertension Mother   . Heart disease Father   . Hypertension Father     Allergies  Allergen Reactions  . Sulfa Antibiotics Rash and Itching    Other reaction(s): Diarrhea and vomiting (finding)  . Erythromycin Nausea And Vomiting    Other reaction(s): Diarrhea and vomiting (finding)  . Aspirin Other (See Comments), Tinitus and Nausea And Vomiting    Ringing of the ears, caused hearing loss both ears Ringing in ears  . Contrast Media [Iodinated Diagnostic Agents] Rash and Hives  . Latex Rash and Itching  . Penicillin G Rash  . Penicillins Rash    Other reaction(s): UNKNOWN  . Tape Rash    Other reaction(s): UNKNOWN Adhesive Other reaction(s): UNKNOWN Adhesive     Review of Systems   Review of Systems: Negative Unless  Checked Constitutional: [] Weight loss  [] Fever  [] Chills Cardiac: [] Chest pain   []  Atrial Fibrillation  [] Palpitations   [] Shortness of breath when laying flat   [] Shortness of breath with exertion. [] Shortness of breath at rest Vascular:  [] Pain in legs with walking   [] Pain in legs with standing [] Pain in legs when laying flat   [] Claudication    [] Pain in feet when laying flat    [] History of DVT   [] Phlebitis   [x] Swelling in legs   [x] Varicose veins   [] Non-healing ulcers Pulmonary:   [] Uses home oxygen   [] Productive cough   [] Hemoptysis   [] Wheeze  [x] COPD   [] Asthma Neurologic:  [] Dizziness   [] Seizures  [] Blackouts [] History of stroke   [] History of TIA  [] Aphasia   [] Temporary Blindness   [] Weakness or numbness in arm   [] Weakness or numbness in leg Musculoskeletal:   [] Joint swelling   [x] Joint pain   [] Low back pain  []  History of Knee Replacement [] Arthritis [] back Surgeries  []  Spinal Stenosis    Hematologic:  [] Easy bruising  [] Easy bleeding   [] Hypercoagulable state   [] Anemic Gastrointestinal:  [] Diarrhea   [] Vomiting  [] Gastroesophageal reflux/heartburn   [] Difficulty swallowing. [] Abdominal pain Genitourinary:  [] Chronic kidney disease   [] Difficult urination  [] Anuric   [] Blood in urine [] Frequent urination  [] Burning with urination   [] Hematuria Skin:  [x] Rashes   [] Ulcers [] Wounds Psychological:  [] History of anxiety   []  History of major depression  []  Memory Difficulties     Objective:   Physical Exam  BP (!) 114/54 (BP Location: Right Arm, Patient Position: Sitting)   Pulse 87   Resp 16   Ht 5' (1.524 m)   Wt 115 lb 3.2 oz (52.3 kg)   BMI 22.50 kg/m   Gen: WD/WN, NAD Head: [place]/AT, No temporalis wasting.  Ear/Nose/Throat: Hearing grossly intact, nares w/o erythema or drainage Eyes: PER, EOMI, sclera nonicteric.  Neck: Supple, no masses.  No JVD.  Pulmonary:  Good air movement, no use of accessory muscles.  Cardiac: RRR Vascular: minimal swelling, scattered  varicosities  Vessel Right Left  Radial Palpable Palpable  Dorsalis Pedis Palpable Palpable  Posterior Tibial Palpable Palpable   Gastrointestinal: soft, non-distended. No guarding/no peritoneal signs.  Musculoskeletal: M/S 5/5 throughout.  No deformity or atrophy.  Neurologic: Pain and light touch intact in extremities.  Symmetrical.  Speech is fluent. Motor exam as listed above. Psychiatric: Judgment intact, Mood & affect appropriate for pt's clinical situation. Dermatologic: bilateral stasis dermatitis. No Ulcers Noted.  No changes consistent  with cellulitis. Lymph : No Cervical lymphadenopathy, no lichenification or skin changes of chronic lymphedema.      Assessment & Plan:   1. Cellulitis of leg, left Cellulitis has resolved.  Based off of bilateral abi's there is not an arterial component contributing to her recurrent cellulitis.    Patient was advised to follow conservative therapy outlined below to try to help prevent recurrence of cellulitis.    2. Lymphedema I have had a long discussion with the patient regarding swelling and why it  causes symptoms.  Patient will begin wearing graduated compression stockings class 1 (20-30 mmHg) on a daily basis a prescription was given. The patient will  beginning wearing the stockings first thing in the morning and removing them in the evening. The patient is instructed specifically not to sleep in the stockings.   In addition, behavioral modification will be initiated.  This will include frequent elevation, use of over the counter pain medications and exercise such as walking.  I have reviewed systemic causes for chronic edema such as liver, kidney and cardiac etiologies.  The patient denies problems with these organ systems.    Consideration for a lymph pump will also be made based upon the effectiveness of conservative therapy.  This would help to improve the edema control and prevent sequela such as ulcers and infections   The patient  would like to try more conservative therapy first.  She will follow up in four months to determine whether she needs additional intervention beyond conservative therapy.   3. Chronic venous insufficiency  Recommend:  I have had a long discussion with the patient regarding  varicose veins and why they cause symptoms.  Patient will begin wearing graduated compression stockings class 1 on a daily basis, beginning first thing in the morning and removing them in the evening. The patient is instructed specifically not to sleep in the stockings.    The patient  will also begin using over-the-counter analgesics such as Motrin 600 mg po TID to help control the symptoms.    In addition, behavioral modification including elevation during the day will be initiated.      Current Outpatient Medications on File Prior to Visit  Medication Sig Dispense Refill  . acetaminophen (TYLENOL) 500 MG tablet Take 1 tablet (500 mg total) by mouth every 6 (six) hours as needed for moderate pain. Also give 500 mg three times a day for 7  Days. 60 tablet 0  . albuterol (PROVENTIL HFA;VENTOLIN HFA) 108 (90 Base) MCG/ACT inhaler Inhale 2 puffs into the lungs every 6 (six) hours as needed for wheezing or shortness of breath. 1 Inhaler 2  . AMBULATORY NON FORMULARY MEDICATION Medication Name: incentive spirometry Use as directed 1 each 0  . buPROPion (WELLBUTRIN) 75 MG tablet Take 1 tablet (75 mg total) by mouth every morning. 90 tablet 1  . CALCIUM CITRATE PO Take by mouth.    . celecoxib (CELEBREX) 200 MG capsule TAKE 1 CAPSULE DAILY 90 capsule 3  . cholestyramine (QUESTRAN) 4 g packet ADD 1 PACKET TO 8OZ OF FLUID AND DRINK 1-2 TIMES A DAY AS NEEDED LOOSE STOOLS 180 each 1  . Cyanocobalamin (VITAMIN B12) 500 MCG TABS Take by mouth.    . diphenoxylate-atropine (LOMOTIL) 2.5-0.025 MG tablet Take by mouth. Take 1 tablet by mouth 2 (two) times daily as needed for Diarrhea As need abdominal pain/diarrhea.    . fluticasone  (FLONASE) 50 MCG/ACT nasal spray USE 2 SPRAYS NASALLY DAILY 48 g 2  .  furosemide (LASIX) 40 MG tablet TAKE 1 TABLET DAILY 90 tablet 4  . levocetirizine (XYZAL) 5 MG tablet TAKE 1 TABLET AT BEDTIME EVERY NIGHT 90 tablet 4  . levothyroxine (SYNTHROID, LEVOTHROID) 50 MCG tablet TAKE 1 TABLET DAILY 90 tablet 4  . lidocaine (LIDODERM) 5 % Place 1 patch onto the skin daily. Remove & Discard patch within 12 hours or as directed by MD 30 patch 5  . loperamide (IMODIUM) 2 MG capsule Take 2 mg by mouth as needed for diarrhea or loose stools.    . magic mouthwash SOLN Swish and spit 5 ml twice daily until healed 240 mL 0  . magnesium gluconate (MAGONATE) 500 MG tablet Take 400 mg by mouth daily.     . meclizine (ANTIVERT) 12.5 MG tablet Take 12.5 mg by mouth every 4 (four) hours as needed for dizziness.    . Multiple Vitamin (MULTIVITAMIN) tablet Take 1 tablet by mouth daily.    Marland Kitchen nystatin cream (MYCOSTATIN) Apply 1 application topically 2 (two) times daily. 30 g 0  . OSCIMIN 0.125 MG tablet TAKE 1 TABLET TWICE A DAY 180 tablet 3  . pantoprazole (PROTONIX) 20 MG tablet Take 1 tablet (20 mg total) by mouth daily. 90 tablet 1  . Probiotic Product (PROBIOTIC COLON SUPPORT PO) Take 1 capsule by mouth daily.    . simvastatin (ZOCOR) 20 MG tablet TAKE 1 TABLET DAILY 90 tablet 4  . spironolactone (ALDACTONE) 25 MG tablet TAKE 1 TABLET DAILY 90 tablet 4  . sucralfate (CARAFATE) 1 g tablet May take before meals 21 tablet 0  . tamsulosin (FLOMAX) 0.4 MG CAPS capsule TAKE 1 CAPSULE DAILY 90 capsule 4  . tiotropium (SPIRIVA HANDIHALER) 18 MCG inhalation capsule Place 1 capsule (18 mcg total) into inhaler and inhale daily. 90 capsule 3  . traZODone (DESYREL) 50 MG tablet Take 0.5-1 tablets (25-50 mg total) by mouth at bedtime. Sleep at 10:30 PM 90 tablet 0  . triamcinolone cream (KENALOG) 0.1 % APP BID UNTIL CLEAR/FLAT  0  . venlafaxine XR (EFFEXOR XR) 37.5 MG 24 hr capsule Take 1 capsule (37.5 mg total) by mouth  daily. To be taken with 150 mg 90 capsule 1  . venlafaxine XR (EFFEXOR-XR) 150 MG 24 hr capsule Take 1 capsule (150 mg total) by mouth daily. To be taken with 37.5 mg 90 capsule 1  . Wheat Dextrin (BENEFIBER DRINK MIX PO) Take by mouth.     No current facility-administered medications on file prior to visit.     There are no Patient Instructions on file for this visit. Return in about 4 months (around 05/23/2018).   Kris Hartmann, NP  This note was completed with Sales executive.  Any errors are purely unintentional.

## 2018-02-12 DIAGNOSIS — L408 Other psoriasis: Secondary | ICD-10-CM | POA: Diagnosis not present

## 2018-02-23 NOTE — Progress Notes (Signed)
*   [facility]   COPD. --She has am mucus production minimal dyspnea.  No recent exacerbations or chest infections, and appears to be doing quite well without significant exertional dyspnea. --Dyspnea on exertion well controlled with spiriva. --Continue spiriva daily, use albuterol as needed.    Date: 02/23/2018  MRN# [id number] Sandra Brown 1926-10-31   Sandra Brown is a 85 y.o. old female seen in follow up for chief complaint of  Chief Complaint  Patient presents with  . COPD     HPI:  The patient is an 85 year old female with a history of COPD.  Last visit she was asked to continue Spiriva. Today she feels that her breathing is doing well. She has no dyspnea at rest. She walks with a walker and has some dyspnea when rushing.  She is using spiriva once daily and feels that it helping, especially with the coughing. She has not required her albuterol very often.   Imaging personally reviewed, CT chest 07/27/14, emphysema with hyperinflation, hiatal hernia.  Medication:    Current Outpatient Medications:  .  acetaminophen (TYLENOL) 500 MG tablet, Take 1 tablet (500 mg total) by mouth every 6 (six) hours as needed for moderate pain. Also give 500 mg three times a day for 7  Days., Disp: 60 tablet, Rfl: 0 .  albuterol (PROVENTIL HFA;VENTOLIN HFA) 108 (90 Base) MCG/ACT inhaler, Inhale 2 puffs into the lungs every 6 (six) hours as needed for wheezing or shortness of breath., Disp: 1 Inhaler, Rfl: 2 .  AMBULATORY NON FORMULARY MEDICATION, Medication Name: incentive spirometry Use as directed, Disp: 1 each, Rfl: 0 .  buPROPion (WELLBUTRIN) 75 MG tablet, Take 1 tablet (75 mg total) by mouth every morning., Disp: 90 tablet, Rfl: 1 .  CALCIUM CITRATE PO, Take by mouth., Disp: , Rfl:  .  celecoxib (CELEBREX) 200 MG capsule, TAKE 1 CAPSULE DAILY, Disp: 90 capsule, Rfl: 3 .  cholestyramine (QUESTRAN) 4 g packet, ADD 1 PACKET TO 8OZ OF FLUID AND DRINK 1-2 TIMES A  DAY AS NEEDED LOOSE STOOLS, Disp: 180 each, Rfl: 1 .  Cyanocobalamin (VITAMIN B12) 500 MCG TABS, Take by mouth., Disp: , Rfl:  .  diphenoxylate-atropine (LOMOTIL) 2.5-0.025 MG tablet, Take by mouth. Take 1 tablet by mouth 2 (two) times daily as needed for Diarrhea As need abdominal pain/diarrhea., Disp: , Rfl:  .  fluticasone (FLONASE) 50 MCG/ACT nasal spray, USE 2 SPRAYS NASALLY DAILY, Disp: 48 g, Rfl: 2 .  furosemide (LASIX) 40 MG tablet, TAKE 1 TABLET DAILY, Disp: 90 tablet, Rfl: 4 .  levocetirizine (XYZAL) 5 MG tablet, TAKE 1 TABLET AT BEDTIME EVERY NIGHT, Disp: 90 tablet, Rfl: 4 .  levothyroxine (SYNTHROID, LEVOTHROID) 50 MCG tablet, TAKE 1 TABLET DAILY, Disp: 90 tablet, Rfl: 4 .  lidocaine (LIDODERM) 5 %, Place 1 patch onto the skin daily. Remove & Discard patch within 12 hours or as directed by MD, Disp: 30 patch, Rfl: 5 .  loperamide (IMODIUM) 2 MG capsule, Take 2 mg by mouth as needed for diarrhea or loose stools., Disp: , Rfl:  .  magic mouthwash SOLN, Swish and spit 5 ml twice daily until healed, Disp: 240 mL, Rfl: 0 .  magnesium gluconate (MAGONATE) 500 MG tablet, Take 400 mg by mouth daily. , Disp: , Rfl:  .  meclizine (ANTIVERT) 12.5 MG tablet, Take 12.5 mg by mouth every 4 (four) hours as needed for dizziness., Disp: , Rfl:  .  Multiple Vitamin (MULTIVITAMIN) tablet, Take 1  tablet by mouth daily., Disp: , Rfl:  .  nystatin cream (MYCOSTATIN), Apply 1 application topically 2 (two) times daily., Disp: 30 g, Rfl: 0 .  OSCIMIN 0.125 MG tablet, TAKE 1 TABLET TWICE A DAY, Disp: 180 tablet, Rfl: 3 .  pantoprazole (PROTONIX) 20 MG tablet, Take 1 tablet (20 mg total) by mouth daily., Disp: 90 tablet, Rfl: 1 .  Probiotic Product (PROBIOTIC COLON SUPPORT PO), Take 1 capsule by mouth daily., Disp: , Rfl:  .  simvastatin (ZOCOR) 20 MG tablet, TAKE 1 TABLET DAILY, Disp: 90 tablet, Rfl: 4 .  spironolactone (ALDACTONE) 25 MG tablet, TAKE 1 TABLET DAILY, Disp: 90 tablet, Rfl: 4 .  sucralfate  (CARAFATE) 1 g tablet, May take before meals, Disp: 21 tablet, Rfl: 0 .  tamsulosin (FLOMAX) 0.4 MG CAPS capsule, TAKE 1 CAPSULE DAILY, Disp: 90 capsule, Rfl: 4 .  tiotropium (SPIRIVA HANDIHALER) 18 MCG inhalation capsule, Place 1 capsule (18 mcg total) into inhaler and inhale daily., Disp: 90 capsule, Rfl: 3 .  traZODone (DESYREL) 50 MG tablet, Take 0.5-1 tablets (25-50 mg total) by mouth at bedtime. Sleep at 10:30 PM, Disp: 90 tablet, Rfl: 0 .  triamcinolone cream (KENALOG) 0.1 %, APP BID UNTIL CLEAR/FLAT, Disp: , Rfl: 0 .  venlafaxine XR (EFFEXOR XR) 37.5 MG 24 hr capsule, Take 1 capsule (37.5 mg total) by mouth daily. To be taken with 150 mg, Disp: 90 capsule, Rfl: 1 .  venlafaxine XR (EFFEXOR-XR) 150 MG 24 hr capsule, Take 1 capsule (150 mg total) by mouth daily. To be taken with 37.5 mg, Disp: 90 capsule, Rfl: 1 .  Wheat Dextrin (BENEFIBER DRINK MIX PO), Take by mouth., Disp: , Rfl:    Allergies:  Sulfa antibiotics; Erythromycin; Aspirin; Contrast media [iodinated diagnostic agents]; Latex; Penicillin g; Penicillins; and Tape  Review of Systems:  Constitutional: Feels well. Cardiovascular: Denies chest pain, exertional chest pain.  Pulmonary: Denies hemoptysis, pleuritic chest pain.   The remainder of systems were reviewed and were found to be negative other than what is documented in the HPI.    Physical Examination:   VS: BP 100/62 (BP Location: Left Arm, Cuff Size: Normal)   Pulse (!) 103   Resp 16   Ht 5' (1.524 m)   Wt 117 lb (53.1 kg)   SpO2 97%   BMI 22.85 kg/m   General Appearance: No distress  Neuro:without focal findings, mental status, speech normal, alert and oriented HEENT: PERRLA, EOM intact Pulmonary: No wheezing, No rales  CardiovascularNormal S1,S2.  No m/r/g.  Abdomen: Benign, Soft, non-tender, No masses Renal:  No costovertebral tenderness  GU:  No performed at this time. Endoc: No evident thyromegaly, no signs of acromegaly or Cushing features Skin:    warm, no rashes, no ecchymosis  Extremities: normal, no cyanosis, clubbing.     LABORATORY PANEL:   CBC No results for input(s): WBC, HGB, HCT, PLT in the last 168 hours. ------------------------------------------------------------------------------------------------------------------  Chemistries  No results for input(s): NA, K, CL, CO2, GLUCOSE, BUN, CREATININE, CALCIUM, MG, AST, ALT, ALKPHOS, BILITOT in the last 168 hours.  Invalid input(s): GFRCGP ------------------------------------------------------------------------------------------------------------------  Cardiac Enzymes No results for input(s): TROPONINI in the last 168 hours. ------------------------------------------------------------  RADIOLOGY:   No results found for this or any previous visit. No results found for this or any previous visit. ------------------------------------------------------------------------------------------------------------------  Thank  you for allowing [facility] to assist in the care of your patient. Our recommendations are noted above.  Please contact us if we can  be of further service.  Marda Stalker, M.D., F.C.C.P.  Board Certified in Internal Medicine, Pulmonary Medicine, [place], and Sleep Medicine.   Pulmonary and Critical Care Office Number: [phone]  02/23/2018

## 2018-02-24 ENCOUNTER — Ambulatory Visit (INDEPENDENT_AMBULATORY_CARE_PROVIDER_SITE_OTHER): Payer: Medicare Other | Admitting: Internal Medicine

## 2018-02-24 ENCOUNTER — Encounter: Payer: Self-pay | Admitting: Internal Medicine

## 2018-02-24 VITALS — BP 100/62 | HR 103 | Resp 16 | Ht 60.0 in | Wt 117.0 lb

## 2018-02-24 DIAGNOSIS — J449 Chronic obstructive pulmonary disease, unspecified: Secondary | ICD-10-CM | POA: Diagnosis not present

## 2018-02-24 NOTE — Patient Instructions (Signed)
Continue spiriva once daily, albuterol as needed.

## 2018-02-27 ENCOUNTER — Encounter: Payer: Self-pay | Admitting: Psychiatry

## 2018-02-27 ENCOUNTER — Ambulatory Visit (INDEPENDENT_AMBULATORY_CARE_PROVIDER_SITE_OTHER): Payer: Medicare Other | Admitting: Psychiatry

## 2018-02-27 VITALS — BP 126/57 | HR 103 | Ht <= 58 in | Wt 118.0 lb

## 2018-02-27 DIAGNOSIS — F5105 Insomnia due to other mental disorder: Secondary | ICD-10-CM | POA: Diagnosis not present

## 2018-02-27 DIAGNOSIS — F424 Excoriation (skin-picking) disorder: Secondary | ICD-10-CM

## 2018-02-27 DIAGNOSIS — F330 Major depressive disorder, recurrent, mild: Secondary | ICD-10-CM | POA: Diagnosis not present

## 2018-02-27 MED ORDER — SUVOREXANT 5 MG PO TABS: 5.0000 mg | ORAL_TABLET | Freq: Every day | ORAL | 1 refills | Status: DC

## 2018-02-27 NOTE — Progress Notes (Signed)
[facility] MD OP Progress Note  02/27/2018 12:12 PM Sandra Brown  MRN:  [id number]  Chief Complaint: ' I am here for follow up." Chief Complaint    Follow-up     HPI: Sandra Brown is an 85 yr old female, widowed, lives at [facility] assisted living facility, presented to clinic today for a follow-up visit.  Patient with history of depression, insomnia, COPD, hypothyroidism, hypertension.  Patient today presented with her daughter [name] who provided collateral information.  Patient reports she continues to struggle with sleep problems.  She takes trazodone at 9 PM however is unable to sleep until 2 or 3 AM in the morning.  Patient reports she has been taking her Effexor earlier on during the day however that also has not helped much.  Patient has tried medications like Ambien and melatonin previously with no good benefit.  Patient also reports a new problem.  She reports she has been struggling with skin picking since the past few months.  She reports it is worsening.  She reports she has had it previously however she never mentioned this to anyone.  She may have talked about this to her previous psychiatrist.  She however was never treated for the same.  She reports she picks on her arm and has a compulsion to do so.  She showed writer her left arm that has excoriation superficial all over her skin.  Discussed with patient that the Effexor should help to some extent however she reports it does not.  The Effexor and Wellbutrin together are helping her mood symptoms.  Patient as well as daughter reports they would like to try therapy instruct medication changes today.  We will refer her for therapy sessions with therapist here in clinic.  Patient reports appetite is fair.  Patient denies any suicidality homicidality or perceptual disturbances. Visit Diagnosis:    ICD-10-CM   1. MDD (major depressive disorder), recurrent episode, mild ([place]) F33.0   2. Insomnia due to mental condition F51.05  Suvorexant (BELSOMRA) 5 MG TABS  3. Skin-picking disorder F42.4     Past Psychiatric History: Reviewed past psychiatric history from my progress note on 03/21/2017.  Past Medical History:  Past Medical History:  Diagnosis Date  . Cataract   . COPD (chronic obstructive pulmonary disease) ([place])   . Depression   . Difficulty swallowing   . Frequent headaches   . Hearing loss   . Hypertension   . Hypothyroidism   . Reflux     Past Surgical History:  Procedure Laterality Date  . ABDOMINAL HYSTERECTOMY    . APPENDECTOMY    . LUMBAR LAMINECTOMY    . PARATHYROIDECTOMY    . TOTAL HIP ARTHROPLASTY     x 4    Family Psychiatric History: Reviewed family psychiatric history from my progress note on 03/21/2017.  Family History:  Family History  Problem Relation Age of Onset  . Stroke Mother   . Hypertension Mother   . Heart disease Father   . Hypertension Father     Social History: Reviewed social history from my progress note on 03/21/2017. Social History   Socioeconomic History  . Marital status: Widowed    Spouse name: Not on file  . Number of children: Not on file  . Years of education: Not on file  . Highest education level: Not on file  Occupational History  . Not on file  Social Needs  . Financial resource strain: Not on file  . Food insecurity:    Worry:  Not on file    Inability: Not on file  . Transportation needs:    Medical: Not on file    Non-medical: Not on file  Tobacco Use  . Smoking status: Never Smoker  . Smokeless tobacco: Never Used  . Tobacco comment: quit 1954  Substance and Sexual Activity  . Alcohol use: No  . Drug use: No  . Sexual activity: Never  Lifestyle  . Physical activity:    Days per week: Not on file    Minutes per session: Not on file  . Stress: Not on file  Relationships  . Social connections:    Talks on phone: Not on file    Gets together: Not on file    Attends religious service: Not on file    Active member of club or  organization: Not on file    Attends meetings of clubs or organizations: Not on file    Relationship status: Not on file  Other Topics Concern  . Not on file  Social History Narrative  . Not on file    Allergies:  Allergies  Allergen Reactions  . Sulfa Antibiotics Rash and Itching    Other reaction(s): Diarrhea and vomiting (finding)  . Erythromycin Nausea And Vomiting    Other reaction(s): Diarrhea and vomiting (finding)  . Aspirin Other (See Comments), Tinitus and Nausea And Vomiting    Ringing of the ears, caused hearing loss both ears Ringing in ears  . Contrast Media [Iodinated Diagnostic Agents] Rash and Hives  . Latex Rash and Itching  . Penicillin G Rash  . Penicillins Rash    Other reaction(s): UNKNOWN  . Tape Rash    Other reaction(s): UNKNOWN Adhesive Other reaction(s): UNKNOWN Adhesive    Metabolic Disorder Labs: No results found for: HGBA1C, MPG No results found for: PROLACTIN No results found for: CHOL, TRIG, HDL, CHOLHDL, VLDL, LDLCALC No results found for: TSH  Therapeutic Level Labs: No results found for: LITHIUM No results found for: VALPROATE No components found for:  CBMZ  Current Medications: Current Outpatient Medications  Medication Sig Dispense Refill  . albuterol (PROVENTIL HFA;VENTOLIN HFA) 108 (90 Base) MCG/ACT inhaler Inhale 2 puffs into the lungs every 6 (six) hours as needed for wheezing or shortness of breath. 1 Inhaler 2  . AMBULATORY NON FORMULARY MEDICATION Medication Name: incentive spirometry Use as directed 1 each 0  . buPROPion (WELLBUTRIN) 75 MG tablet Take 1 tablet (75 mg total) by mouth every morning. 90 tablet 1  . CALCIUM CITRATE PO Take by mouth.    . celecoxib (CELEBREX) 200 MG capsule TAKE 1 CAPSULE DAILY 90 capsule 3  . cholestyramine (QUESTRAN) 4 g packet ADD 1 PACKET TO 8OZ OF FLUID AND DRINK 1-2 TIMES A DAY AS NEEDED LOOSE STOOLS 180 each 1  . Cyanocobalamin (VITAMIN B12) 500 MCG TABS Take by mouth.    .  diphenoxylate-atropine (LOMOTIL) 2.5-0.025 MG tablet Take by mouth. Take 1 tablet by mouth 2 (two) times daily as needed for Diarrhea As need abdominal pain/diarrhea.    . fluticasone (FLONASE) 50 MCG/ACT nasal spray USE 2 SPRAYS NASALLY DAILY 48 g 2  . furosemide (LASIX) 40 MG tablet TAKE 1 TABLET DAILY 90 tablet 4  . levocetirizine (XYZAL) 5 MG tablet TAKE 1 TABLET AT BEDTIME EVERY NIGHT 90 tablet 4  . levothyroxine (SYNTHROID, LEVOTHROID) 50 MCG tablet TAKE 1 TABLET DAILY 90 tablet 4  . lidocaine (LIDODERM) 5 % Place 1 patch onto the skin daily. Remove & Discard patch within 12  hours or as directed by MD 30 patch 5  . loperamide (IMODIUM) 2 MG capsule Take 2 mg by mouth as needed for diarrhea or loose stools.    . magic mouthwash SOLN Swish and spit 5 ml twice daily until healed 240 mL 0  . magnesium gluconate (MAGONATE) 500 MG tablet Take 400 mg by mouth daily.     . meclizine (ANTIVERT) 12.5 MG tablet Take 12.5 mg by mouth every 4 (four) hours as needed for dizziness.    . Multiple Vitamin (MULTIVITAMIN) tablet Take 1 tablet by mouth daily.    Marland Kitchen nystatin cream (MYCOSTATIN) Apply 1 application topically 2 (two) times daily. 30 g 0  . OSCIMIN 0.125 MG tablet TAKE 1 TABLET TWICE A DAY 180 tablet 3  . pantoprazole (PROTONIX) 20 MG tablet Take 1 tablet (20 mg total) by mouth daily. 90 tablet 1  . Probiotic Product (PROBIOTIC COLON SUPPORT PO) Take 1 capsule by mouth daily.    . simvastatin (ZOCOR) 20 MG tablet TAKE 1 TABLET DAILY 90 tablet 4  . spironolactone (ALDACTONE) 25 MG tablet TAKE 1 TABLET DAILY 90 tablet 4  . sucralfate (CARAFATE) 1 g tablet May take before meals 21 tablet 0  . tamsulosin (FLOMAX) 0.4 MG CAPS capsule TAKE 1 CAPSULE DAILY 90 capsule 4  . tiotropium (SPIRIVA HANDIHALER) 18 MCG inhalation capsule Place 1 capsule (18 mcg total) into inhaler and inhale daily. 90 capsule 3  . triamcinolone cream (KENALOG) 0.1 % APP BID UNTIL CLEAR/FLAT  0  . venlafaxine XR (EFFEXOR XR) 37.5  MG 24 hr capsule Take 1 capsule (37.5 mg total) by mouth daily. To be taken with 150 mg 90 capsule 1  . venlafaxine XR (EFFEXOR-XR) 150 MG 24 hr capsule Take 1 capsule (150 mg total) by mouth daily. To be taken with 37.5 mg 90 capsule 1  . Wheat Dextrin (BENEFIBER DRINK MIX PO) Take by mouth.    Marland Kitchen acetaminophen (TYLENOL) 500 MG tablet Take 1 tablet (500 mg total) by mouth every 6 (six) hours as needed for moderate pain. Also give 500 mg three times a day for 7  Days. (Patient not taking: Reported on 02/24/2018) 60 tablet 0  . Suvorexant (BELSOMRA) 5 MG TABS Take 5 mg by mouth at bedtime. 30 tablet 1   No current facility-administered medications for this visit.      Musculoskeletal: Strength & Muscle Tone: within normal limits Gait & Station: walks with walker Patient leans: Front  Psychiatric Specialty Exam: Review of Systems  Psychiatric/Behavioral: The patient has insomnia.   All other systems reviewed and are negative.   Blood pressure (!) 126/57, pulse (!) 103, height 4\' 9"  (1.448 m), weight 118 lb (53.5 kg).Body mass index is 25.53 kg/m.  General Appearance: Casual  Eye Contact:  Fair  Speech:  Normal Rate  Volume:  Normal  Mood:  Euthymic  Affect:  Appropriate  Thought Process:  Goal Directed and Descriptions of Associations: Intact  Orientation:  Full (Time, Place, and Person)  Thought Content: Logical   Suicidal Thoughts:  No  Homicidal Thoughts:  No  Memory:  Immediate;   Fair Recent;   Fair Remote;   Fair  Judgement:  Fair  Insight:  Fair  Psychomotor Activity:  Normal  Concentration:  Concentration: Good and Attention Span: Fair  Recall:  [employer] of Knowledge: Fair  Language: Fair  Akathisia:  No  Handed:  Right  AIMS (if indicated): denies tremors, rigidity,stiffness  Assets:  Communication Skills Desire for  Improvement Social Support  ADL's:  Intact  Cognition: WNL  Sleep:  Poor   Screenings: PHQ2-9     Procedure visit from 09/04/2015 in [facility] Procedure visit from 10/03/2014 in [facility] Visit from 09/21/2014 in [facility]  PHQ-2 Total Score  0  0  1       Assessment and Plan: Garrett is a 85 year old Caucasian female who has a history of depression, COPD, hypothyroidism, hypertension, presented to clinic today for a follow-up visit.  Patient is currently struggling with sleep problems as well as skin picking.  Will continue to make medication changes.  Plan MDD-improving Effexor 187.5 mg p.o. daily Wellbutrin 75 mg p.o. daily  For skin picking disorder- unstable Refer for CBT. Effexor will also help.  For insomnia-unstable Discontinue trazodone for lack of efficacy. Start Belsomra 5 mg p.o. nightly.  I have obtained collateral information from [place] her daughter who presented with the patient.  I have referred her to our therapist here in clinic for new diagnosis of skin picking disorder.  Provided medication education.  Follow-up in clinic in 4 weeks or sooner if needed.  I have spent atleast 25 minutes face to face with patient today. More than 50 % of the time was spent for psychoeducation and supportive psychotherapy and care coordination.  This note was generated in part or whole with voice recognition software. Voice recognition is usually quite accurate but there are transcription errors that can and very often do occur. I apologize for any typographical errors that were not detected and corrected.       Ursula Alert, MD 02/27/2018, 12:12 PM

## 2018-02-27 NOTE — Patient Instructions (Signed)
Skin-Picking Disorder  Skin-picking (excoriation) disorder is a mental disorder of picking at one's skin without being aware of it (unconsciously). If you have skin-picking disorder, you may keep picking at your skin even when it causes infections or stress or it hurts your social and emotional well-being. Skin-picking disorder can develop at any age, but it most commonly appears first during adolescence. What are the causes? Skin-picking disorder is more likely to be related to a type of obsessive-compulsive disorder (OCD), rather than a bigger skin problem.  OCD involves having unwanted and distressing thoughts, ideas, or urges (obsessions), and doing repetitive physical or mental acts (compulsions) that bring a temporary sense of relief or calming.  Compulsions of OCD may be briefly calming, but a person is likely to feel distress over the consequences of these acts. People with skin-picking disorder may have other mental health concerns like depression or anxiety, which also may need treatment. The cause of this condition is not known. It often develops as a result of:  Unresolved stress.  A skin injury or infection that causes itchiness or a scab. Acne is one example. What increases the risk? You are more likely to develop this condition if:  You have OCD.  You have body dysmorphic disorder (BDD). This is a mental health condition that involves an obsession with how you look.  You use or have used methamphetamine or cocaine.  You are female. What are the signs or symptoms? Symptoms of this condition include:  Unconsciously and repeatedly picking at your skin. This may be done in order to feel some relief from stress or other emotional difficulties.  Harming your body in noticeable ways. In severe cases, sharp objects may be used.  Inability to stop yourself from picking at your skin. How is this diagnosed? To diagnose skin-picking disorder, your health care provider may perform  a physical exam to rule out skin disorders, and ask questions about:  Your mental health and well-being, including stressful events you have experienced that may have triggered your condition.  Any strong emotions you have trouble dealing with, which lead to skin picking.  Any history of substance abuse problems.  Any history of skin injury or infection that may have triggered your condition. You may be referred to a mental health care provider or a skin specialist (dermatologist) to confirm a diagnosis and determine what treatment is best for you. How is this treated? The first step in treatment is to make yourself aware of your disorder and the problems it causes. Treatment options may include:  Cognitive behavioral therapy (CBT). This focuses on identifying and changing problematic thoughts and behaviors that cause your condition.  Mindfulness-based cognitive therapy. This type of CBT emphasizes focusing your attention, meditating, and developing awareness of the present moment (mindfulness).  Habit reversal therapy. This focuses on becoming aware of your picking and learning to use relaxation techniques or do something else instead of picking.  Acceptance and Commitment Therapy (ACT). This focuses on accepting yourself as you are and setting goals for making changes.  Antidepressant medicines.  Working with a dermatologist to treat any skin problems caused by the disorder. Follow these instructions at home:  Take over-the-counter and prescription medicines only as told by your health care provider.  Become aware of what triggers you to pick at your skin, and try to avoid those triggers. Develop skills to deal with issues that trigger picking.  If you feel an urge to pick at your skin, use strategies to stop yourself.  Therapy can help you develop strategies, such as: ? Taking a deep breath. ? Naming the feelings you are having. ? Keeping your hands busy.  Consider joining a  support group for people who have skin-picking disorder. This can help overcome shame that you might feel and help you cope with your condition.  Keep all follow-up visits as told by your health care provider or therapist, including therapy and dermatology visits. This is important. Contact a health care provider if:  Your emotions or stress have become overwhelming.  You develop signs of infection in an area that is affected by picking. Signs of infection may include: ? Redness. ? Swelling. ? Pain. ? Warmth. ? Fluid or pus drainage. ? Fever. Get help right away if:  You have thoughts about hurting yourself or others. If you ever feel like you may hurt yourself or others, or have thoughts about taking your own life, get help right away. You can go to your nearest emergency department or call:  Your local emergency services (911 in the [place]  A suicide crisis helpline, such as the [facility] at [phone]. This is open 24 hours a day. Summary  Skin-picking (excoriation) disorder is a mental disorder of picking at one's skin without being aware of it (unconsciously). It is often related to obsessive-compulsive disorder (OCD).  The diagnosis of this disorder is made through both a physical exam and a mental health assessment.  Treatment may involve managing physical skin problems, finding the appropriate mental health treatment, and taking medicine.  Consider joining a support group for people who have skin-picking disorder. This can help overcome shame that you might feel and help you cope with your condition. This information is not intended to replace advice given to you by your health care provider. Make sure you discuss any questions you have with your health care provider. Document Released: 04/19/2016 Document Revised: 04/19/2016 Document Reviewed: 04/19/2016 Elsevier Interactive Patient Education  2019 [place] oral tablets What  is this medicine? SUVOREXANT (su-vor-EX-ant) is used to treat insomnia. This medicine helps you to fall asleep and sleep through the night. This medicine may be used for other purposes; ask your health care provider or pharmacist if you have questions. COMMON BRAND NAME(S): Belsomra What should I tell my health care provider before I take this medicine? They need to know if you have any of these conditions: -depression -history of a drug or alcohol abuse problem -history of daytime sleepiness -history of sudden onset of muscle weakness (cataplexy) -liver disease -lung or breathing disease -narcolepsy -suicidal thoughts, plans, or attempt; a previous suicide attempt by you or a family member -an unusual or allergic reaction to suvorexant, other medicines, foods, dyes, or preservatives -pregnant or trying to get pregnant -breast-feeding How should I use this medicine? Take this medicine by mouth within 30 minutes of going to bed. Do not take it unless you are able to stay in bed a full night before you must be active again. Follow the directions on the prescription label. For best results, it is better to take this medicine on an empty stomach. Do not take your medicine more often than directed. Do not stop taking this medicine on your own. Always follow your doctor or health care professional's advice. A special MedGuide will be given to you by the pharmacist with each prescription and refill. Be sure to read this information carefully each time. Talk to your pediatrician regarding the use of this medicine in children. Special  care may be needed. Overdosage: If you think you have taken too much of this medicine contact a poison control center or emergency room at once. NOTE: This medicine is only for you. Do not share this medicine with others. What if I miss a dose? This medicine should only be taken immediately before going to sleep. Do not take double or extra doses. What may interact with  this medicine? -alcohol -antiviral medicines for HIV or AIDS -aprepitant -carbamazepine -certain antibiotics like ciprofloxacin, clarithromycin, erythromycin, telithromycin -certain medicines for depression or psychotic disturbances -certain medicines for fungal infections like ketoconazole, posaconazole, fluconazole, or itraconazole -conivaptan -digoxin -diltiazem -grapefruit juice -imatinib -medicines for anxiety or sleep -phenytoin -rifampin -verapamil This list may not describe all possible interactions. Give your health care provider a list of all the medicines, herbs, non-prescription drugs, or dietary supplements you use. Also tell them if you smoke, drink alcohol, or use illegal drugs. Some items may interact with your medicine. What should I watch for while using this medicine? Visit your doctor or health care professional for regular checks on your progress. Keep a regular sleep schedule by going to bed at about the same time each night. Avoid caffeine-containing drinks in the evening hours. When sleep medicines are used every night for more than a few weeks, they may stop working. Do not increase the dose on your own. Talk to your doctor if your insomnia worsens or is not better within 7 to 10 days. After taking this medicine, you may get up out of bed and do an activity that you do not know you are doing. The next morning, you may have no memory of this. Activities include driving a car ("sleep-driving"), making and eating food, talking on the phone, sexual activity, and sleep-walking. Serious injuries have occurred. Call your doctor right away if you find out you have done any of these activities. Do not take this medicine if you have used alcohol that evening. Do not take it if you have taken another medicine for sleep. The risk of doing these sleep-related activities is higher. Do not take this medicine unless you are able to stay in bed for a full night (7 to 8 hours) and do not  drive or perform other activities requiring full alertness within 8 hours of a dose. Do not drive, use machinery, or do anything that needs mental alertness the day after you take the 20 mg dose of this medicine. The use of lower doses (10 mg) also has the potential to cause driving impairment the next day. You may have a decrease in mental alertness the day after use, even if you feel that you are fully awake. Tell your doctor if you will need to perform activities requiring full alertness, such as driving, the next day. Do not stand or sit up quickly after taking this medicine, especially if you are an older patient. This reduces the risk of dizzy or fainting spells. If you or your family notice any changes in your behavior, such as new or worsening depression, thoughts of harming yourself, anxiety, other unusual or disturbing thoughts, or memory loss, call your doctor right away. What side effects may I notice from receiving this medicine? Side effects that you should report to your doctor or health care professional as soon as possible: -allergic reactions like skin rash, itching or hives, swelling of the face, lips, or tongue -confusion -depressed mood -feeling faint or lightheaded, falls -hallucinations -inability to move or speak for up to several minutes  while you are going to sleep or waking up -memory loss -periods of leg weakness lasting from seconds to a few minutes -problems with balance, speaking, walking -restlessness, excitability, or feelings of agitation -unusual activities while asleep like driving, eating, making phone calls Side effects that usually do not require medical attention (report to your doctor or health care professional if they continue or are bothersome): -abnormal dreams -daytime drowsiness -diarrhea -dizziness -headache This list may not describe all possible side effects. Call your doctor for medical advice about side effects. You may report side effects to  FDA at 1-800-FDA-1088. Where should I keep my medicine? Keep out of the reach of children. This medicine can be abused. Keep your medicine in a safe place to protect it from theft. Do not share this medicine with anyone. Selling or giving away this medicine is dangerous and against the law. Store at room temperature between 15 and 30 degrees C (59 and 86 degrees F). Throw away any unused medicine after the expiration date. NOTE: This sheet is a summary. It may not cover all possible information. If you have questions about this medicine, talk to your doctor, pharmacist, or health care provider.  2019 Elsevier/Gold Standard (2017-06-20 12:32:42)

## 2018-03-02 ENCOUNTER — Telehealth: Payer: Self-pay

## 2018-03-02 DIAGNOSIS — F5105 Insomnia due to other mental disorder: Secondary | ICD-10-CM

## 2018-03-02 MED ORDER — ESZOPICLONE 1 MG PO TABS: 1.0000 mg | ORAL_TABLET | Freq: Every evening | ORAL | 1 refills | Status: DC | PRN

## 2018-03-02 NOTE — Telephone Encounter (Signed)
Belsomra not covered , will send Lunesta to pharmacy.

## 2018-03-02 NOTE — Telephone Encounter (Signed)
received a fax stating that patient insurance plan does not corver belsomra.  perferred medications zolpidemtartrate, zaleplon, eszopiclone, zolpidemtartrateer

## 2018-03-05 ENCOUNTER — Telehealth: Payer: Self-pay

## 2018-03-05 DIAGNOSIS — F330 Major depressive disorder, recurrent, mild: Secondary | ICD-10-CM

## 2018-03-05 MED ORDER — BUPROPION HCL 75 MG PO TABS: 75.0000 mg | ORAL_TABLET | Freq: Every morning | ORAL | 0 refills | Status: DC

## 2018-03-05 MED ORDER — VENLAFAXINE HCL ER 37.5 MG PO CP24: 37.5000 mg | ORAL_CAPSULE | Freq: Every day | ORAL | 0 refills | Status: DC

## 2018-03-05 MED ORDER — VENLAFAXINE HCL ER 150 MG PO CP24: 150.0000 mg | ORAL_CAPSULE | Freq: Every day | ORAL | 0 refills | Status: DC

## 2018-03-05 NOTE — Telephone Encounter (Signed)
Sent Effexor and wellbutrin to [facility] on [address] for 10 days supply.

## 2018-03-05 NOTE — Telephone Encounter (Signed)
the assistant living facility called left message that patient needs a small about of medications [facility] [address] because [facility] states it take 10 days to process and she is almost out of medications.

## 2018-03-13 DIAGNOSIS — L821 Other seborrheic keratosis: Secondary | ICD-10-CM | POA: Diagnosis not present

## 2018-03-13 DIAGNOSIS — L408 Other psoriasis: Secondary | ICD-10-CM | POA: Diagnosis not present

## 2018-03-13 DIAGNOSIS — L281 Prurigo nodularis: Secondary | ICD-10-CM | POA: Diagnosis not present

## 2018-03-19 ENCOUNTER — Other Ambulatory Visit: Payer: Self-pay | Admitting: Psychiatry

## 2018-03-19 DIAGNOSIS — F330 Major depressive disorder, recurrent, mild: Secondary | ICD-10-CM

## 2018-03-19 NOTE — Telephone Encounter (Signed)
[facility] called states patient needs a 90 day supply of wellbutrin [facility]  and then a 10 day supply sent to local pharmacy until they [facility] rx [facility]

## 2018-03-20 ENCOUNTER — Other Ambulatory Visit: Payer: Self-pay | Admitting: Psychiatry

## 2018-03-20 DIAGNOSIS — F330 Major depressive disorder, recurrent, mild: Secondary | ICD-10-CM

## 2018-03-23 MED ORDER — VENLAFAXINE HCL ER 37.5 MG PO CP24: 37.5000 mg | ORAL_CAPSULE | Freq: Every day | ORAL | 0 refills | Status: DC

## 2018-03-23 MED ORDER — VENLAFAXINE HCL ER 150 MG PO CP24: 150.0000 mg | ORAL_CAPSULE | Freq: Every day | ORAL | 0 refills | Status: DC

## 2018-03-23 MED ORDER — BUPROPION HCL 75 MG PO TABS: 75.0000 mg | ORAL_TABLET | Freq: Every morning | ORAL | 0 refills | Status: DC

## 2018-03-23 NOTE — Telephone Encounter (Signed)
I [facility] it to [facility] 

## 2018-03-23 NOTE — Telephone Encounter (Signed)
can a 30 day supply be sent into  [facility] they are still waiting on mail order to send medications

## 2018-03-23 NOTE — Telephone Encounter (Signed)
Sent effexor, wellbutrin 30 days supply to [facility]

## 2018-03-24 ENCOUNTER — Ambulatory Visit: Payer: Medicare Other | Admitting: Licensed Clinical Social Worker

## 2018-03-26 ENCOUNTER — Ambulatory Visit: Payer: Medicare Other | Admitting: Psychiatry

## 2018-03-30 ENCOUNTER — Telehealth: Payer: Self-pay

## 2018-03-30 ENCOUNTER — Telehealth: Payer: Self-pay | Admitting: Psychiatry

## 2018-03-30 DIAGNOSIS — F5105 Insomnia due to other mental disorder: Secondary | ICD-10-CM

## 2018-03-30 MED ORDER — ESZOPICLONE 1 MG PO TABS: 1.0000 mg | ORAL_TABLET | Freq: Every day | ORAL | 0 refills | Status: DC

## 2018-03-30 NOTE — Telephone Encounter (Signed)
Sent Lunesta to pharmacy for bedtime daily - 90 days supply. - To [facility], [address]

## 2018-03-30 NOTE — Telephone Encounter (Signed)
[facility] [phone] asking if she can take every night for the lunesta for 30 or 90 day supply.

## 2018-04-08 NOTE — Telephone Encounter (Signed)
Appears that dr. Shea Evans took care of this: See attached.   eszopiclone (LUNESTA) 1 MG TABS tablet  Medication  Date: 03/30/2018 Department: [facility] Ordering/Authorizing: Ursula Alert, MD  Order Providers   Prescribing Provider Encounter Provider  Ursula Alert, MD Ursula Alert, MD  Outpatient Medication Detail    Disp Refills Start End   eszopiclone (LUNESTA) 1 MG TABS tablet 90 tablet 0 03/30/2018 06/28/2018   Sig - Route: Take 1 tablet (1 mg total) by mouth at bedtime. Take immediately before bedtime - Oral   Sent to pharmacy as: eszopiclone (LUNESTA) 1 MG Tab tablet   E-Prescribing Status: Receipt confirmed by pharmacy (03/30/2018 11:42 AM EDT)

## 2018-04-17 ENCOUNTER — Ambulatory Visit: Payer: Medicare Other | Admitting: Podiatry

## 2018-04-23 ENCOUNTER — Telehealth: Payer: Self-pay

## 2018-04-23 ENCOUNTER — Telehealth: Payer: Self-pay | Admitting: Family Medicine

## 2018-04-23 DIAGNOSIS — F5105 Insomnia due to other mental disorder: Secondary | ICD-10-CM

## 2018-04-23 MED ORDER — PANTOPRAZOLE SODIUM 20 MG PO TBEC: 20.0000 mg | DELAYED_RELEASE_TABLET | Freq: Every day | ORAL | 1 refills | Status: DC

## 2018-04-23 NOTE — Telephone Encounter (Signed)
Pt needs a refill on her Protonix 20 mg.  [facility]  Thanks teri

## 2018-04-23 NOTE — Telephone Encounter (Signed)
[facility] for the lunesta. pt is taking daily to help sleep needs refills sent there 90 day supply.

## 2018-04-24 MED ORDER — ESZOPICLONE 1 MG PO TABS: 1.0000 mg | ORAL_TABLET | Freq: Every day | ORAL | 1 refills | Status: DC

## 2018-04-24 NOTE — Telephone Encounter (Signed)
[facility]esta [facility] - [facility] days

## 2018-04-30 ENCOUNTER — Telehealth: Payer: Self-pay

## 2018-04-30 DIAGNOSIS — F410 Panic disorder [episodic paroxysmal anxiety] without agoraphobia: Secondary | ICD-10-CM

## 2018-04-30 NOTE — Telephone Encounter (Signed)
pt needs refill on xanax and effexor a 30day to regular pharmacy [facility] and a 90 day for [facility]

## 2018-04-30 NOTE — Telephone Encounter (Signed)
Xanax I dont see on pt medication list. Nursing director states that patient had a bottle that dr. Einar Grad gave pt in 2018  Of .25mg  and she takes 1/2 .  Told her that dr. Shea Evans doesn't prescribe but will send a message and that dr Shea Evans may call her to discuss this

## 2018-05-01 MED ORDER — HYDROXYZINE HCL 25 MG PO TABS: 12.5000 mg | ORAL_TABLET | ORAL | 1 refills | Status: DC

## 2018-05-01 NOTE — Telephone Encounter (Signed)
Sent vistaril to pharmacy . Spoke to [employer] Retail buyer as well as patient. Patient had supplies of xanax from long time ago. Discussed not restarting it . Will Vistaril - she takes it few times a month only.

## 2018-05-14 ENCOUNTER — Other Ambulatory Visit: Payer: Self-pay | Admitting: Psychiatry

## 2018-05-14 DIAGNOSIS — F330 Major depressive disorder, recurrent, mild: Secondary | ICD-10-CM

## 2018-05-14 MED ORDER — VENLAFAXINE HCL ER 37.5 MG PO CP24: 37.5000 mg | ORAL_CAPSULE | Freq: Every day | ORAL | 0 refills | Status: DC

## 2018-05-14 MED ORDER — VENLAFAXINE HCL ER 150 MG PO CP24: 150.0000 mg | ORAL_CAPSULE | Freq: Every day | ORAL | 0 refills | Status: DC

## 2018-05-14 NOTE — Telephone Encounter (Signed)
the assistant living facility called states that they need a [facility] and then a [facility] day supply [facility]  of the venlafaxine 150mg  and 37.5 mg.

## 2018-05-14 NOTE — Telephone Encounter (Signed)
Sent effexor to [employer] [facility]

## 2018-05-29 ENCOUNTER — Ambulatory Visit (INDEPENDENT_AMBULATORY_CARE_PROVIDER_SITE_OTHER): Payer: Medicare Other | Admitting: Vascular Surgery

## 2018-06-17 ENCOUNTER — Other Ambulatory Visit: Payer: Self-pay | Admitting: Psychiatry

## 2018-06-17 DIAGNOSIS — F330 Major depressive disorder, recurrent, mild: Secondary | ICD-10-CM

## 2018-07-08 ENCOUNTER — Ambulatory Visit (INDEPENDENT_AMBULATORY_CARE_PROVIDER_SITE_OTHER): Payer: Medicare Other | Admitting: Nurse Practitioner

## 2018-07-08 ENCOUNTER — Other Ambulatory Visit: Payer: Self-pay

## 2018-07-08 ENCOUNTER — Ambulatory Visit: Payer: Medicare Other | Admitting: Physician Assistant

## 2018-07-08 ENCOUNTER — Encounter (INDEPENDENT_AMBULATORY_CARE_PROVIDER_SITE_OTHER): Payer: Self-pay | Admitting: Nurse Practitioner

## 2018-07-08 VITALS — BP 115/79 | HR 99 | Resp 18 | Ht 60.0 in | Wt 119.0 lb

## 2018-07-08 DIAGNOSIS — I890 Lymphedema, not elsewhere classified: Secondary | ICD-10-CM

## 2018-07-08 DIAGNOSIS — E782 Mixed hyperlipidemia: Secondary | ICD-10-CM | POA: Diagnosis not present

## 2018-07-08 DIAGNOSIS — Z79899 Other long term (current) drug therapy: Secondary | ICD-10-CM | POA: Diagnosis not present

## 2018-07-08 DIAGNOSIS — L03116 Cellulitis of left lower limb: Secondary | ICD-10-CM

## 2018-07-08 MED ORDER — DOXYCYCLINE HYCLATE 100 MG PO CAPS: 100.0000 mg | ORAL_CAPSULE | Freq: Two times a day (BID) | ORAL | 0 refills | Status: DC

## 2018-07-12 NOTE — Progress Notes (Signed)
SUBJECTIVE:  Patient ID: Sandra Brown, female    DOB: 1926-04-20, 85 y.o.   MRN: [id number] Chief Complaint  Patient presents with  . Follow-up    patient states she has cellulitis    HPI  Sandra Brown is a 85 y.o. female that follows up with concerns for cellulitis.  Her left lower extremity is slightly swollen and more erythematous than her right.  It is slightly warm and she states it is tender.  She denies any fever, chills, nausea or vomiting.    Past Medical History:  Diagnosis Date  . Cataract   . COPD (chronic obstructive pulmonary disease) ([place])   . Depression   . Difficulty swallowing   . Frequent headaches   . Hearing loss   . Hypertension   . Hypothyroidism   . Reflux     Past Surgical History:  Procedure Laterality Date  . ABDOMINAL HYSTERECTOMY    . APPENDECTOMY    . LUMBAR LAMINECTOMY    . PARATHYROIDECTOMY    . TOTAL HIP ARTHROPLASTY     x 4    Social History   Socioeconomic History  . Marital status: Widowed    Spouse name: Not on file  . Number of children: Not on file  . Years of education: Not on file  . Highest education level: Not on file  Occupational History  . Not on file  Social Needs  . Financial resource strain: Not on file  . Food insecurity    Worry: Not on file    Inability: Not on file  . Transportation needs    Medical: Not on file    Non-medical: Not on file  Tobacco Use  . Smoking status: Never Smoker  . Smokeless tobacco: Never Used  . Tobacco comment: quit 1954  Substance and Sexual Activity  . Alcohol use: No  . Drug use: No  . Sexual activity: Never  Lifestyle  . Physical activity    Days per week: Not on file    Minutes per session: Not on file  . Stress: Not on file  Relationships  . Social Herbalist on phone: Not on file    Gets together: Not on file    Attends religious service: Not on file    Active member of club or organization: Not on file    Attends meetings of clubs or  organizations: Not on file    Relationship status: Not on file  . Intimate partner violence    Fear of current or ex partner: Not on file    Emotionally abused: Not on file    Physically abused: Not on file    Forced sexual activity: Not on file  Other Topics Concern  . Not on file  Social History Narrative  . Not on file    Family History  Problem Relation Age of Onset  . Stroke Mother   . Hypertension Mother   . Heart disease Father   . Hypertension Father     Allergies  Allergen Reactions  . Sulfa Antibiotics Rash and Itching    Other reaction(s): Diarrhea and vomiting (finding)  . Erythromycin Nausea And Vomiting    Other reaction(s): Diarrhea and vomiting (finding)  . Aspirin Other (See Comments), Tinitus and Nausea And Vomiting    Ringing of the ears, caused hearing loss both ears Ringing in ears  . Contrast Media [Iodinated Diagnostic Agents] Rash and Hives  . Latex Rash and Itching  . Penicillin G  Rash  . Penicillins Rash    Other reaction(s): UNKNOWN  . Tape Rash    Other reaction(s): UNKNOWN Adhesive Other reaction(s): UNKNOWN Adhesive     Review of Systems   Review of Systems: Negative Unless Checked Constitutional: [] Weight loss  [] Fever  [] Chills Cardiac: [] Chest pain   []  Atrial Fibrillation  [] Palpitations   [] Shortness of breath when laying flat   [] Shortness of breath with exertion. [] Shortness of breath at rest Vascular:  [] Pain in legs with walking   [] Pain in legs with standing [] Pain in legs when laying flat   [] Claudication    [] Pain in feet when laying flat    [] History of DVT   [] Phlebitis   [x] Swelling in legs   [] Varicose veins   [] Non-healing ulcers Pulmonary:   [] Uses home oxygen   [] Productive cough   [] Hemoptysis   [] Wheeze  [] COPD   [] Asthma Neurologic:  [] Dizziness   [] Seizures  [] Blackouts [] History of stroke   [] History of TIA  [] Aphasia   [] Temporary Blindness   [] Weakness or numbness in arm   [] Weakness or numbness in leg  Musculoskeletal:   [] Joint swelling   [] Joint pain   [] Low back pain  []  History of Knee Replacement [x] Arthritis [] back Surgeries  []  Spinal Stenosis    Hematologic:  [] Easy bruising  [] Easy bleeding   [] Hypercoagulable state   [] Anemic Gastrointestinal:  [] Diarrhea   [] Vomiting  [] Gastroesophageal reflux/heartburn   [] Difficulty swallowing. [] Abdominal pain Genitourinary:  [] Chronic kidney disease   [] Difficult urination  [] Anuric   [] Blood in urine [] Frequent urination  [] Burning with urination   [] Hematuria Skin:  [x] Rashes   [] Ulcers [] Wounds Psychological:  [] History of anxiety   []  History of major depression  []  Memory Difficulties      OBJECTIVE:   Physical Exam  BP 115/79   Pulse 99   Resp 18   Ht 5' (1.524 m)   Wt 119 lb (54 kg)   BMI 23.24 kg/m   Gen: WD/WN, NAD Head: [place]/AT, No temporalis wasting.  Ear/Nose/Throat: Hearing grossly intact, nares w/o erythema or drainage Eyes: PER, EOMI, sclera nonicteric.  Neck: Supple, no masses.  No JVD.  Pulmonary:  Good air movement, no use of accessory muscles.  Cardiac: RRR Vascular: 2+ soft edema bilaterally  Vessel Right Left  Radial Palpable Palpable  Brachial Palpable Palpable  Femoral Palpable Palpable  Popliteal Palpable Palpable  Dorsalis Pedis Palpable Palpable  Posterior Tibial Palpable Palpable   Gastrointestinal: soft, non-distended. No guarding/no peritoneal signs.  Musculoskeletal: M/S 5/5 throughout.  No deformity or atrophy.  Neurologic: Pain and light touch intact in extremities.  Symmetrical.  Speech is fluent. Motor exam as listed above. Psychiatric: Judgment intact, Mood & affect appropriate for pt's clinical situation. Dermatologic: No Venous rashes. No Ulcers Noted.  Cellulitis left lower extremity  Lymph : No Cervical lymphadenopathy, no lichenification or skin changes of chronic lymphedema.       ASSESSMENT AND PLAN:  1. Cellulitis of leg, left Given her age, we will do a 10 day course of  antibiotics.  The patient will follow up in 2 weeks to evaluate progress with cellulitis.  - doxycycline (VIBRAMYCIN) 100 MG capsule; Take 1 capsule (100 mg total) by mouth 2 (two) times daily.  Dispense: 20 capsule; Refill: 0  2. Lymphedema Once cellulitis has resolved, we will have patient return to using medical grade one compression stockings.  She should continue to elevate her lower extremities and exercise as possible.   3. Mixed hyperlipidemia Continue statin  as ordered and reviewed, no changes at this time    Current Outpatient Medications on File Prior to Visit  Medication Sig Dispense Refill  . acetaminophen (TYLENOL) 500 MG tablet Take 1 tablet (500 mg total) by mouth every 6 (six) hours as needed for moderate pain. Also give 500 mg three times a day for 7  Days. 60 tablet 0  . albuterol (PROVENTIL HFA;VENTOLIN HFA) 108 (90 Base) MCG/ACT inhaler Inhale 2 puffs into the lungs every 6 (six) hours as needed for wheezing or shortness of breath. 1 Inhaler 2  . AMBULATORY NON FORMULARY MEDICATION Medication Name: incentive spirometry Use as directed 1 each 0  . buPROPion (WELLBUTRIN) 75 MG tablet TAKE 1 TABLET EVERY MORNING 90 tablet 3  . buPROPion (WELLBUTRIN) 75 MG tablet Take 1 tablet (75 mg total) by mouth every morning. 30 tablet 0  . CALCIUM CITRATE PO Take by mouth.    . celecoxib (CELEBREX) 200 MG capsule TAKE 1 CAPSULE DAILY 90 capsule 3  . cholestyramine (QUESTRAN) 4 g packet ADD 1 PACKET TO 8OZ OF FLUID AND DRINK 1-2 TIMES A DAY AS NEEDED LOOSE STOOLS 180 each 1  . Cyanocobalamin (VITAMIN B12) 500 MCG TABS Take by mouth.    . diphenoxylate-atropine (LOMOTIL) 2.5-0.025 MG tablet Take by mouth. Take 1 tablet by mouth 2 (two) times daily as needed for Diarrhea As need abdominal pain/diarrhea.    . fluticasone (FLONASE) 50 MCG/ACT nasal spray USE 2 SPRAYS NASALLY DAILY 48 g 2  . furosemide (LASIX) 40 MG tablet TAKE 1 TABLET DAILY 90 tablet 4  . hydrOXYzine (ATARAX/VISTARIL) 25  MG tablet Take 0.5 tablets (12.5 mg total) by mouth as directed. 2-3 times a week - Only for severe panic attacks 45 tablet 1  . levocetirizine (XYZAL) 5 MG tablet TAKE 1 TABLET AT BEDTIME EVERY NIGHT 90 tablet 4  . levothyroxine (SYNTHROID, LEVOTHROID) 50 MCG tablet TAKE 1 TABLET DAILY 90 tablet 4  . lidocaine (LIDODERM) 5 % Place 1 patch onto the skin daily. Remove & Discard patch within 12 hours or as directed by MD 30 patch 5  . loperamide (IMODIUM) 2 MG capsule Take 2 mg by mouth as needed for diarrhea or loose stools.    . magic mouthwash SOLN Swish and spit 5 ml twice daily until healed 240 mL 0  . magnesium gluconate (MAGONATE) 500 MG tablet Take 400 mg by mouth daily.     . meclizine (ANTIVERT) 12.5 MG tablet Take 12.5 mg by mouth every 4 (four) hours as needed for dizziness.    . Multiple Vitamin (MULTIVITAMIN) tablet Take 1 tablet by mouth daily.    Marland Kitchen nystatin cream (MYCOSTATIN) Apply 1 application topically 2 (two) times daily. 30 g 0  . OSCIMIN 0.125 MG tablet TAKE 1 TABLET TWICE A DAY 180 tablet 3  . pantoprazole (PROTONIX) 20 MG tablet Take 1 tablet (20 mg total) by mouth daily. 90 tablet 1  . Probiotic Product (PROBIOTIC COLON SUPPORT PO) Take 1 capsule by mouth daily.    . simvastatin (ZOCOR) 20 MG tablet TAKE 1 TABLET DAILY 90 tablet 4  . spironolactone (ALDACTONE) 25 MG tablet TAKE 1 TABLET DAILY 90 tablet 4  . sucralfate (CARAFATE) 1 g tablet May take before meals 21 tablet 0  . tamsulosin (FLOMAX) 0.4 MG CAPS capsule TAKE 1 CAPSULE DAILY 90 capsule 4  . tiotropium (SPIRIVA HANDIHALER) 18 MCG inhalation capsule Place 1 capsule (18 mcg total) into inhaler and inhale daily. 90 capsule 3  .  triamcinolone cream (KENALOG) 0.1 % APP BID UNTIL CLEAR/FLAT  0  . venlafaxine XR (EFFEXOR XR) 37.5 MG 24 hr capsule Take 1 capsule (37.5 mg total) by mouth daily. To be taken with 150 mg 90 capsule 0  . venlafaxine XR (EFFEXOR-XR) 150 MG 24 hr capsule TAKE ONE CAPSULE BY MOUTH DAILY WITH  BREAKFAST( COMBINE WITH 37.5 MG) 30 capsule 0  . venlafaxine XR (EFFEXOR-XR) 150 MG 24 hr capsule Take 1 capsule (150 mg total) by mouth daily. To be taken with 37.5 mg 90 capsule 0  . venlafaxine XR (EFFEXOR-XR) 37.5 MG 24 hr capsule TAKE ONE CAPSULE BY MOUTH DAILY WITH BREAKFAST TO BE COMBINED WITH 150 MG 30 capsule 0  . Wheat Dextrin (BENEFIBER DRINK MIX PO) Take by mouth.    . eszopiclone (LUNESTA) 1 MG TABS tablet Take 1 tablet (1 mg total) by mouth at bedtime. Take immediately before bedtime for sleep (Patient not taking: Reported on 07/08/2018) 90 tablet 1   No current facility-administered medications on file prior to visit.     There are no Patient Instructions on file for this visit. Return in about 4 weeks (around 08/05/2018).   Kris Hartmann, NP  This note was completed with Sales executive.  Any errors are purely unintentional.

## 2018-07-14 ENCOUNTER — Encounter (INDEPENDENT_AMBULATORY_CARE_PROVIDER_SITE_OTHER): Payer: Self-pay | Admitting: Nurse Practitioner

## 2018-07-24 ENCOUNTER — Encounter (INDEPENDENT_AMBULATORY_CARE_PROVIDER_SITE_OTHER): Payer: Self-pay | Admitting: Vascular Surgery

## 2018-07-24 ENCOUNTER — Ambulatory Visit (INDEPENDENT_AMBULATORY_CARE_PROVIDER_SITE_OTHER): Payer: Medicare Other | Admitting: Vascular Surgery

## 2018-07-24 ENCOUNTER — Other Ambulatory Visit: Payer: Self-pay

## 2018-07-24 VITALS — BP 128/73 | HR 99 | Resp 16 | Ht 60.0 in | Wt 118.0 lb

## 2018-07-24 DIAGNOSIS — I890 Lymphedema, not elsewhere classified: Secondary | ICD-10-CM

## 2018-07-24 DIAGNOSIS — M7989 Other specified soft tissue disorders: Secondary | ICD-10-CM | POA: Diagnosis not present

## 2018-07-24 DIAGNOSIS — N183 Chronic kidney disease, stage 3 unspecified: Secondary | ICD-10-CM

## 2018-07-24 DIAGNOSIS — L03116 Cellulitis of left lower limb: Secondary | ICD-10-CM | POA: Diagnosis not present

## 2018-07-24 DIAGNOSIS — E782 Mixed hyperlipidemia: Secondary | ICD-10-CM

## 2018-07-24 NOTE — Assessment & Plan Note (Signed)
lipid control important in reducing the progression of atherosclerotic disease. Continue statin therapy  

## 2018-07-24 NOTE — Patient Instructions (Signed)

## 2018-07-24 NOTE — Assessment & Plan Note (Signed)
This is been persistent despite the appropriate use of conservative therapies over months to years.  We have not checked her venous system in some time, and I think a venous duplex would be prudent.  This will be done at the patient's convenience.  I also believe she has developed lymphedema from chronic scarring and lymphatic channels a lymphedema pump would be an excellent adjuvant therapy.  We will try to get this approved.

## 2018-07-24 NOTE — Assessment & Plan Note (Signed)
The patient is clearly developed lymphedema from chronic scarring of lymphatic channels.  She would have stage II lymphedema at this point with some edema not responsive to elevation or compression and some skin changes present.  A lymphedema pump would be an appropriate adjuvant therapy at this point in addition to compression, elevation, and activity.  We will try to get this approved.  We are also going to reassess her venous system.  She can return after her reflux study in the future.

## 2018-07-24 NOTE — Assessment & Plan Note (Signed)
Has been treated with multiple rounds of antibiotics.

## 2018-07-24 NOTE — Progress Notes (Signed)
MRN : [id number]  Sandra Brown is a 85 y.o. (1926-01-23) female who presents with chief complaint of  Chief Complaint  Patient presents with  . Follow-up    2 wk f/u no studies  .  History of Present Illness: Patient returns today in follow up of her leg swelling and discoloration.  She was given antibiotics for possible cellulitis a couple of weeks ago, but her legs remain discolored and swollen particularly on the left side.  These are quite painful.  She wears compression stockings each day.  She tries to elevate them and does so even in bed but her swelling persists.  No open ulcerations.  No fever or chills.  Current Outpatient Medications  Medication Sig Dispense Refill  . acetaminophen (TYLENOL) 500 MG tablet Take 1 tablet (500 mg total) by mouth every 6 (six) hours as needed for moderate pain. Also give 500 mg three times a day for 7  Days. 60 tablet 0  . albuterol (PROVENTIL HFA;VENTOLIN HFA) 108 (90 Base) MCG/ACT inhaler Inhale 2 puffs into the lungs every 6 (six) hours as needed for wheezing or shortness of breath. 1 Inhaler 2  . AMBULATORY NON FORMULARY MEDICATION Medication Name: incentive spirometry Use as directed 1 each 0  . buPROPion (WELLBUTRIN) 75 MG tablet TAKE 1 TABLET EVERY MORNING 90 tablet 3  . buPROPion (WELLBUTRIN) 75 MG tablet Take 1 tablet (75 mg total) by mouth every morning. 30 tablet 0  . CALCIUM CITRATE PO Take by mouth.    . celecoxib (CELEBREX) 200 MG capsule TAKE 1 CAPSULE DAILY 90 capsule 3  . cholestyramine (QUESTRAN) 4 g packet ADD 1 PACKET TO 8OZ OF FLUID AND DRINK 1-2 TIMES A DAY AS NEEDED LOOSE STOOLS 180 each 1  . Cyanocobalamin (VITAMIN B12) 500 MCG TABS Take by mouth.    . diphenoxylate-atropine (LOMOTIL) 2.5-0.025 MG tablet Take by mouth. Take 1 tablet by mouth 2 (two) times daily as needed for Diarrhea As need abdominal pain/diarrhea.    . doxycycline (VIBRAMYCIN) 100 MG capsule Take 1 capsule (100 mg total) by mouth 2 (two) times daily.  20 capsule 0  . eszopiclone (LUNESTA) 1 MG TABS tablet Take 1 tablet (1 mg total) by mouth at bedtime. Take immediately before bedtime for sleep 90 tablet 1  . fluticasone (FLONASE) 50 MCG/ACT nasal spray USE 2 SPRAYS NASALLY DAILY 48 g 2  . furosemide (LASIX) 40 MG tablet TAKE 1 TABLET DAILY 90 tablet 4  . hydrOXYzine (ATARAX/VISTARIL) 25 MG tablet Take 0.5 tablets (12.5 mg total) by mouth as directed. 2-3 times a week - Only for severe panic attacks 45 tablet 1  . levocetirizine (XYZAL) 5 MG tablet TAKE 1 TABLET AT BEDTIME EVERY NIGHT 90 tablet 4  . levothyroxine (SYNTHROID, LEVOTHROID) 50 MCG tablet TAKE 1 TABLET DAILY 90 tablet 4  . lidocaine (LIDODERM) 5 % Place 1 patch onto the skin daily. Remove & Discard patch within 12 hours or as directed by MD 30 patch 5  . loperamide (IMODIUM) 2 MG capsule Take 2 mg by mouth as needed for diarrhea or loose stools.    . magic mouthwash SOLN Swish and spit 5 ml twice daily until healed 240 mL 0  . magnesium gluconate (MAGONATE) 500 MG tablet Take 400 mg by mouth daily.     . meclizine (ANTIVERT) 12.5 MG tablet Take 12.5 mg by mouth every 4 (four) hours as needed for dizziness.    . Multiple Vitamin (MULTIVITAMIN) tablet Take 1 tablet  by mouth daily.    Marland Kitchen nystatin cream (MYCOSTATIN) Apply 1 application topically 2 (two) times daily. 30 g 0  . OSCIMIN 0.125 MG tablet TAKE 1 TABLET TWICE A DAY 180 tablet 3  . pantoprazole (PROTONIX) 20 MG tablet Take 1 tablet (20 mg total) by mouth daily. 90 tablet 1  . Probiotic Product (PROBIOTIC COLON SUPPORT PO) Take 1 capsule by mouth daily.    . simvastatin (ZOCOR) 20 MG tablet TAKE 1 TABLET DAILY 90 tablet 4  . spironolactone (ALDACTONE) 25 MG tablet TAKE 1 TABLET DAILY 90 tablet 4  . sucralfate (CARAFATE) 1 g tablet May take before meals 21 tablet 0  . tamsulosin (FLOMAX) 0.4 MG CAPS capsule TAKE 1 CAPSULE DAILY 90 capsule 4  . tiotropium (SPIRIVA HANDIHALER) 18 MCG inhalation capsule Place 1 capsule (18 mcg  total) into inhaler and inhale daily. 90 capsule 3  . triamcinolone cream (KENALOG) 0.1 % APP BID UNTIL CLEAR/FLAT  0  . venlafaxine XR (EFFEXOR XR) 37.5 MG 24 hr capsule Take 1 capsule (37.5 mg total) by mouth daily. To be taken with 150 mg 90 capsule 0  . venlafaxine XR (EFFEXOR-XR) 150 MG 24 hr capsule TAKE ONE CAPSULE BY MOUTH DAILY WITH BREAKFAST( COMBINE WITH 37.5 MG) 30 capsule 0  . venlafaxine XR (EFFEXOR-XR) 150 MG 24 hr capsule Take 1 capsule (150 mg total) by mouth daily. To be taken with 37.5 mg 90 capsule 0  . venlafaxine XR (EFFEXOR-XR) 37.5 MG 24 hr capsule TAKE ONE CAPSULE BY MOUTH DAILY WITH BREAKFAST TO BE COMBINED WITH 150 MG 30 capsule 0  . Wheat Dextrin (BENEFIBER DRINK MIX PO) Take by mouth.     No current facility-administered medications for this visit.     Past Medical History:  Diagnosis Date  . Cataract   . COPD (chronic obstructive pulmonary disease) ([place])   . Depression   . Difficulty swallowing   . Frequent headaches   . Hearing loss   . Hypertension   . Hypothyroidism   . Reflux     Past Surgical History:  Procedure Laterality Date  . ABDOMINAL HYSTERECTOMY    . APPENDECTOMY    . LUMBAR LAMINECTOMY    . PARATHYROIDECTOMY    . TOTAL HIP ARTHROPLASTY     x 4    Social History Social History   Tobacco Use  . Smoking status: Never Smoker  . Smokeless tobacco: Never Used  . Tobacco comment: quit 1954  Substance Use Topics  . Alcohol use: No  . Drug use: No     Family History Family History  Problem Relation Age of Onset  . Stroke Mother   . Hypertension Mother   . Heart disease Father   . Hypertension Father     Allergies  Allergen Reactions  . Sulfa Antibiotics Rash and Itching    Other reaction(s): Diarrhea and vomiting (finding)  . Erythromycin Nausea And Vomiting    Other reaction(s): Diarrhea and vomiting (finding)  . Aspirin Other (See Comments), Tinitus and Nausea And Vomiting    Ringing of the ears, caused hearing loss  both ears Ringing in ears  . Contrast Media [Iodinated Diagnostic Agents] Rash and Hives  . Latex Rash and Itching  . Penicillin G Rash  . Penicillins Rash    Other reaction(s): UNKNOWN  . Tape Rash    Other reaction(s): UNKNOWN Adhesive Other reaction(s): UNKNOWN Adhesive   Review of Systems   Review of Systems: Negative Unless Checked Constitutional: [] ?Weight loss[] ?Fever[] ?Chills Cardiac:[] ?Chest pain[] ?  Atrial Fibrillation[] ?Palpitations [] ?Shortness of breath when laying flat [] ?Shortness of breath with exertion. [] ?Shortness of breath at rest Vascular: [] ?Pain in legs with walking[x] ?Pain in legswith standing[] ?Pain in legs when laying flat [] ?Claudication  [] ?Pain in feet when laying flat [] ?History of DVT [] ?Phlebitis [x] ?Swelling in legs [] ?Varicose veins [] ?Non-healing ulcers Pulmonary: [] ?Uses home oxygen [] ?Productive cough[] ?Hemoptysis [] ?Wheeze [x] ?COPD [] ?Asthma Neurologic: [] ?Dizziness[] ?Seizures [] ?Blackouts[] ?History of stroke [] ?History of TIA[] ?Aphasia [] ?Temporary Blindness[] ?Weaknessor numbness in arm [] ?Weakness or numbnessin leg Musculoskeletal:[] ?Joint swelling [] ?Joint pain [] ?Low back pain  [] ? History of Knee Replacement [x] ?Arthritis [] ?back Surgeries[] ? Spinal Stenosis  Hematologic:[] ?Easy bruising[] ?Easy bleeding [] ?Hypercoagulable state [] ?Anemic Gastrointestinal:[] ?Diarrhea [] ?Vomiting[] ?Gastroesophageal reflux/heartburn[] ?Difficulty swallowing. [] ?Abdominal pain Genitourinary: [] ?Chronic kidney disease [] ?Difficulturination [] ?Anuric[] ?Blood in urine [] ?Frequenturination [] ?Burning with urination[] ?Hematuria Skin: [x] ?Rashes [] ?Ulcers [] ?Wounds Psychological: [] ?History of anxiety[x] ?History of major depression  [] ? Memory Difficulties   Physical Examination  BP 128/73 (BP Location: Right Arm)   Pulse 99   Resp 16   Ht 5' (1.524 m)    Wt 118 lb (53.5 kg)   BMI 23.05 kg/m  Gen:  WD/WN, NAD Head: [place]/AT, No temporalis wasting. Ear/Nose/Throat: Hearing grossly intact, nares w/o erythema or drainage Eyes: Conjunctiva clear. Sclera non-icteric Neck: Supple.  Trachea midline Pulmonary:  Good air movement, no use of accessory muscles.  Cardiac: Somewhat irregular Vascular:  Vessel Right Left  Radial Palpable Palpable                          PT 1+ Palpable Trace Palpable  DP 1+ Palpable 1+ Palpable    Musculoskeletal: M/S 5/5 throughout.  No deformity or atrophy. Walks with a walker. 1+ RLE edema, 1-2+ LLE edema with more pronounced stasis dermatitis changes. Neurologic: Sensation grossly intact in extremities.  Symmetrical.  Speech is fluent.  Psychiatric: Judgment intact, Mood & affect appropriate for pt's clinical situation. Dermatologic: No rashes or ulcers noted.  No cellulitis or open wounds.       Labs No results found for this or any previous visit (from the past 2160 hour(s)).  Radiology No results found.  Assessment/Plan  CKD (chronic kidney disease) stage 3, GFR 30-59 ml/min Contributes to lower extremity swelling.  Try to avoid contrast with any evaluation if possible.  Hyperlipemia lipid control important in reducing the progression of atherosclerotic disease. Continue statin therapy   Cellulitis of leg, left Has been treated with multiple rounds of antibiotics.  Swelling of limb This is been persistent despite the appropriate use of conservative therapies over months to years.  We have not checked her venous system in some time, and I think a venous duplex would be prudent.  This will be done at the patient's convenience.  I also believe she has developed lymphedema from chronic scarring and lymphatic channels a lymphedema pump would be an excellent adjuvant therapy.  We will try to get this approved.  Lymphedema The patient is clearly developed lymphedema from chronic scarring of  lymphatic channels.  She would have stage II lymphedema at this point with some edema not responsive to elevation or compression and some skin changes present.  A lymphedema pump would be an appropriate adjuvant therapy at this point in addition to compression, elevation, and activity.  We will try to get this approved.  We are also going to reassess her venous system.  She can return after her reflux study in the future.    Leotis Pain, MD  07/24/2018 11:05 AM    This note was created with Dragon medical transcription system.  Any errors from  dictation are purely unintentional

## 2018-07-24 NOTE — Assessment & Plan Note (Signed)
Contributes to lower extremity swelling.  Try to avoid contrast with any evaluation if possible.

## 2018-07-30 ENCOUNTER — Other Ambulatory Visit: Payer: Self-pay | Admitting: Physician Assistant

## 2018-07-30 DIAGNOSIS — J449 Chronic obstructive pulmonary disease, unspecified: Secondary | ICD-10-CM

## 2018-07-30 MED ORDER — ALBUTEROL SULFATE HFA 108 (90 BASE) MCG/ACT IN AERS: 2.0000 | INHALATION_SPRAY | Freq: Four times a day (QID) | RESPIRATORY_TRACT | 2 refills | Status: DC | PRN

## 2018-07-30 NOTE — Telephone Encounter (Signed)
Please advise if ok to refill. 

## 2018-07-30 NOTE — Telephone Encounter (Signed)
[facility] faxed refill request for the following medications:  albuterol (PROVENTIL HFA;VENTOLIN HFA) 108 (90 Base) MCG/ACT inhaler   Last Rx: 03/06/2017 LOV: 12/18/2017 with [name]. Pt was scheduled to see Adriana on 07/08/2018 but pt canceled the appt. Please advise. Thanks TNP

## 2018-08-12 ENCOUNTER — Ambulatory Visit (INDEPENDENT_AMBULATORY_CARE_PROVIDER_SITE_OTHER): Payer: Medicare Other

## 2018-08-12 ENCOUNTER — Encounter (INDEPENDENT_AMBULATORY_CARE_PROVIDER_SITE_OTHER): Payer: Self-pay | Admitting: Nurse Practitioner

## 2018-08-12 ENCOUNTER — Ambulatory Visit (INDEPENDENT_AMBULATORY_CARE_PROVIDER_SITE_OTHER): Payer: Medicare Other | Admitting: Nurse Practitioner

## 2018-08-12 ENCOUNTER — Other Ambulatory Visit: Payer: Self-pay

## 2018-08-12 VITALS — BP 162/75 | HR 94 | Resp 20 | Ht 60.0 in | Wt 117.0 lb

## 2018-08-12 DIAGNOSIS — I890 Lymphedema, not elsewhere classified: Secondary | ICD-10-CM

## 2018-08-12 DIAGNOSIS — L03116 Cellulitis of left lower limb: Secondary | ICD-10-CM | POA: Diagnosis not present

## 2018-08-12 DIAGNOSIS — K219 Gastro-esophageal reflux disease without esophagitis: Secondary | ICD-10-CM | POA: Diagnosis not present

## 2018-08-12 DIAGNOSIS — J449 Chronic obstructive pulmonary disease, unspecified: Secondary | ICD-10-CM | POA: Diagnosis not present

## 2018-08-12 DIAGNOSIS — R600 Localized edema: Secondary | ICD-10-CM | POA: Diagnosis not present

## 2018-08-12 DIAGNOSIS — M7989 Other specified soft tissue disorders: Secondary | ICD-10-CM

## 2018-08-12 MED ORDER — DOXYCYCLINE HYCLATE 100 MG PO CAPS: 100.0000 mg | ORAL_CAPSULE | Freq: Two times a day (BID) | ORAL | 0 refills | Status: DC

## 2018-08-13 ENCOUNTER — Other Ambulatory Visit: Payer: Self-pay | Admitting: Psychiatry

## 2018-08-13 DIAGNOSIS — F330 Major depressive disorder, recurrent, mild: Secondary | ICD-10-CM

## 2018-08-19 ENCOUNTER — Encounter (INDEPENDENT_AMBULATORY_CARE_PROVIDER_SITE_OTHER): Payer: Self-pay | Admitting: Nurse Practitioner

## 2018-08-19 NOTE — Progress Notes (Signed)
SUBJECTIVE:  Patient ID: Sandra Brown, female    DOB: 15-Mar-1926, 85 y.o.   MRN: [id number] Chief Complaint  Patient presents with  . Follow-up    HPI  Sandra Brown is a 85 y.o. female The patient returns to the office for followup evaluation regarding leg swelling.  The swelling has improved quite a bit but the pain associated with swelling has continued.  There have not been any interval development of a ulcerations or wounds.  Since the previous visit the patient has been wearing graduated compression stockings and has noted little significant improvement in the lymphedema. The patient has been using compression routinely morning until night.  The patient also states elevation during the day and exercise is being done too.  The patient is concerned for persistent redness and soreness of the left leg and is concerned about recurrent cellulitis.     Past Medical History:  Diagnosis Date  . Cataract   . COPD (chronic obstructive pulmonary disease) ([place])   . Depression   . Difficulty swallowing   . Frequent headaches   . Hearing loss   . Hypertension   . Hypothyroidism   . Reflux     Past Surgical History:  Procedure Laterality Date  . ABDOMINAL HYSTERECTOMY    . APPENDECTOMY    . LUMBAR LAMINECTOMY    . PARATHYROIDECTOMY    . TOTAL HIP ARTHROPLASTY     x 4    Social History   Socioeconomic History  . Marital status: Widowed    Spouse name: Not on file  . Number of children: Not on file  . Years of education: Not on file  . Highest education level: Not on file  Occupational History  . Not on file  Social Needs  . Financial resource strain: Not on file  . Food insecurity    Worry: Not on file    Inability: Not on file  . Transportation needs    Medical: Not on file    Non-medical: Not on file  Tobacco Use  . Smoking status: Never Smoker  . Smokeless tobacco: Never Used  . Tobacco comment: quit 1954  Substance and Sexual Activity  . Alcohol  use: No  . Drug use: No  . Sexual activity: Never  Lifestyle  . Physical activity    Days per week: Not on file    Minutes per session: Not on file  . Stress: Not on file  Relationships  . Social Herbalist on phone: Not on file    Gets together: Not on file    Attends religious service: Not on file    Active member of club or organization: Not on file    Attends meetings of clubs or organizations: Not on file    Relationship status: Not on file  . Intimate partner violence    Fear of current or ex partner: Not on file    Emotionally abused: Not on file    Physically abused: Not on file    Forced sexual activity: Not on file  Other Topics Concern  . Not on file  Social History Narrative  . Not on file    Family History  Problem Relation Age of Onset  . Stroke Mother   . Hypertension Mother   . Heart disease Father   . Hypertension Father     Allergies  Allergen Reactions  . Sulfa Antibiotics Rash and Itching    Other reaction(s): Diarrhea and vomiting (finding)  .  Erythromycin Nausea And Vomiting    Other reaction(s): Diarrhea and vomiting (finding)  . Aspirin Other (See Comments), Tinitus and Nausea And Vomiting    Ringing of the ears, caused hearing loss both ears Ringing in ears  . Contrast Media [Iodinated Diagnostic Agents] Rash and Hives  . Latex Rash and Itching  . Penicillin G Rash  . Penicillins Rash    Other reaction(s): UNKNOWN  . Tape Rash    Other reaction(s): UNKNOWN Adhesive Other reaction(s): UNKNOWN Adhesive     Review of Systems   Review of Systems: Negative Unless Checked Constitutional: [] Weight loss  [] Fever  [] Chills Cardiac: [] Chest pain   []  Atrial Fibrillation  [] Palpitations   [] Shortness of breath when laying flat   [] Shortness of breath with exertion. [] Shortness of breath at rest Vascular:  [] Pain in legs with walking   [] Pain in legs with standing [] Pain in legs when laying flat   [] Claudication    [] Pain in feet  when laying flat    [] History of DVT   [] Phlebitis   [x] Swelling in legs   [] Varicose veins   [] Non-healing ulcers Pulmonary:   [] Uses home oxygen   [] Productive cough   [] Hemoptysis   [] Wheeze  [x] COPD   [] Asthma Neurologic:  [] Dizziness   [] Seizures  [] Blackouts [] History of stroke   [] History of TIA  [] Aphasia   [] Temporary Blindness   [] Weakness or numbness in arm   [] Weakness or numbness in leg Musculoskeletal:   [] Joint swelling   [] Joint pain   [] Low back pain  []  History of Knee Replacement [] Arthritis [] back Surgeries  []  Spinal Stenosis    Hematologic:  [] Easy bruising  [] Easy bleeding   [] Hypercoagulable state   [] Anemic Gastrointestinal:  [] Diarrhea   [] Vomiting  [] Gastroesophageal reflux/heartburn   [] Difficulty swallowing. [] Abdominal pain Genitourinary:  [] Chronic kidney disease   [] Difficult urination  [] Anuric   [] Blood in urine [] Frequent urination  [] Burning with urination   [] Hematuria Skin:  [x] Rashes   [] Ulcers [] Wounds Psychological:  [] History of anxiety   [x]  History of major depression  []  Memory Difficulties      OBJECTIVE:   Physical Exam  BP (!) 162/75 (BP Location: Left Arm)   Pulse 94   Resp 20   Ht 5' (1.524 m)   Wt 117 lb (53.1 kg)   BMI 22.85 kg/m   Gen: WD/WN, NAD Head: [place]/AT, No temporalis wasting.  Ear/Nose/Throat: Hearing grossly intact, nares w/o erythema or drainage Eyes: PER, EOMI, sclera nonicteric.  Neck: Supple, no masses.  No JVD.  Pulmonary:  Good air movement, no use of accessory muscles.  Cardiac: RRR Vascular: 2+ edema  Vessel Right Left  Radial Palpable Palpable  Dorsalis Pedis Palpable Palpable  Posterior Tibial Palpable Palpable   Gastrointestinal: soft, non-distended. No guarding/no peritoneal signs.  Musculoskeletal: M/S 5/5 throughout.  No deformity or atrophy.  Neurologic: Pain and light touch intact in extremities.  Symmetrical.  Speech is fluent. Motor exam as listed above. Psychiatric: Judgment intact, Mood & affect  appropriate for pt's clinical situation. Dermatologic: erythematous area on left shin  Lymph : No Cervical lymphadenopathy, no lichenification or skin changes of chronic lymphedema.       ASSESSMENT AND PLAN:  1. Cellulitis of leg, left The redness appears to be more of a persistent dermatitis but due to patient's advanced age and pain in the lower extremity we will do a short course of antibiotics.  The patient is scheduled to follow up with her PCP and will assess progress.   -  doxycycline (VIBRAMYCIN) 100 MG capsule; Take 1 capsule (100 mg total) by mouth 2 (two) times daily.  Dispense: 20 capsule; Refill: 0  2. Lymphedema  No surgery or intervention at this point in time.    I have reviewed my discussion with the patient regarding lymphedema and why it  causes symptoms.  Patient will continue wearing graduated compression stockings class 1 (20-30 mmHg) on a daily basis a prescription was given. The patient is reminded to put the stockings on first thing in the morning and removing them in the evening. The patient is instructed specifically not to sleep in the stockings.   In addition, behavioral modification throughout the day will be continued.  This will include frequent elevation (such as in a recliner), use of over the counter pain medications as needed and exercise such as walking.  I have reviewed systemic causes for chronic edema such as liver, kidney and cardiac etiologies and there does not appear to be any significant changes in these organ systems over the past year.  The patient is under the impression that these organ systems are all stable and unchanged.    The patient will follow up in 6 months   3. Gastroesophageal reflux disease without esophagitis Continue PPI as already ordered, this medication has been reviewed and there are no changes at this time.  Avoidence of caffeine and alcohol  Moderate elevation of the head of the bed   4. Chronic obstructive pulmonary  disease, unspecified COPD type ([place]) Continue pulmonary medications and aerosols as already ordered, these medications have been reviewed and there are no changes at this time.     Current Outpatient Medications on File Prior to Visit  Medication Sig Dispense Refill  . acetaminophen (TYLENOL) 500 MG tablet Take 1 tablet (500 mg total) by mouth every 6 (six) hours as needed for moderate pain. Also give 500 mg three times a day for 7  Days. 60 tablet 0  . albuterol (VENTOLIN HFA) 108 (90 Base) MCG/ACT inhaler Inhale 2 puffs into the lungs every 6 (six) hours as needed for wheezing or shortness of breath. 8 g 2  . AMBULATORY NON FORMULARY MEDICATION Medication Name: incentive spirometry Use as directed 1 each 0  . buPROPion (WELLBUTRIN) 75 MG tablet TAKE 1 TABLET EVERY MORNING 90 tablet 3  . buPROPion (WELLBUTRIN) 75 MG tablet Take 1 tablet (75 mg total) by mouth every morning. 30 tablet 0  . CALCIUM CITRATE PO Take by mouth.    . celecoxib (CELEBREX) 200 MG capsule TAKE 1 CAPSULE DAILY 90 capsule 3  . cholestyramine (QUESTRAN) 4 g packet ADD 1 PACKET TO 8OZ OF FLUID AND DRINK 1-2 TIMES A DAY AS NEEDED LOOSE STOOLS 180 each 1  . Cyanocobalamin (VITAMIN B12) 500 MCG TABS Take by mouth.    . diphenoxylate-atropine (LOMOTIL) 2.5-0.025 MG tablet Take by mouth. Take 1 tablet by mouth 2 (two) times daily as needed for Diarrhea As need abdominal pain/diarrhea.    . eszopiclone (LUNESTA) 1 MG TABS tablet Take 1 tablet (1 mg total) by mouth at bedtime. Take immediately before bedtime for sleep 90 tablet 1  . fluticasone (FLONASE) 50 MCG/ACT nasal spray USE 2 SPRAYS NASALLY DAILY 48 g 2  . furosemide (LASIX) 40 MG tablet TAKE 1 TABLET DAILY 90 tablet 4  . hydrOXYzine (ATARAX/VISTARIL) 25 MG tablet Take 0.5 tablets (12.5 mg total) by mouth as directed. 2-3 times a week - Only for severe panic attacks 45 tablet 1  . levocetirizine (XYZAL)  5 MG tablet TAKE 1 TABLET AT BEDTIME EVERY NIGHT 90 tablet 4  .  levothyroxine (SYNTHROID, LEVOTHROID) 50 MCG tablet TAKE 1 TABLET DAILY 90 tablet 4  . lidocaine (LIDODERM) 5 % Place 1 patch onto the skin daily. Remove & Discard patch within 12 hours or as directed by MD 30 patch 5  . loperamide (IMODIUM) 2 MG capsule Take 2 mg by mouth as needed for diarrhea or loose stools.    . magic mouthwash SOLN Swish and spit 5 ml twice daily until healed 240 mL 0  . magnesium gluconate (MAGONATE) 500 MG tablet Take 400 mg by mouth daily.     . meclizine (ANTIVERT) 12.5 MG tablet Take 12.5 mg by mouth every 4 (four) hours as needed for dizziness.    . Multiple Vitamin (MULTIVITAMIN) tablet Take 1 tablet by mouth daily.    Marland Kitchen nystatin cream (MYCOSTATIN) Apply 1 application topically 2 (two) times daily. 30 g 0  . OSCIMIN 0.125 MG tablet TAKE 1 TABLET TWICE A DAY 180 tablet 3  . pantoprazole (PROTONIX) 20 MG tablet Take 1 tablet (20 mg total) by mouth daily. 90 tablet 1  . Probiotic Product (PROBIOTIC COLON SUPPORT PO) Take 1 capsule by mouth daily.    . simvastatin (ZOCOR) 20 MG tablet TAKE 1 TABLET DAILY 90 tablet 4  . spironolactone (ALDACTONE) 25 MG tablet TAKE 1 TABLET DAILY 90 tablet 4  . sucralfate (CARAFATE) 1 g tablet May take before meals 21 tablet 0  . tamsulosin (FLOMAX) 0.4 MG CAPS capsule TAKE 1 CAPSULE DAILY 90 capsule 4  . tiotropium (SPIRIVA HANDIHALER) 18 MCG inhalation capsule Place 1 capsule (18 mcg total) into inhaler and inhale daily. 90 capsule 3  . triamcinolone cream (KENALOG) 0.1 % APP BID UNTIL CLEAR/FLAT  0  . venlafaxine XR (EFFEXOR XR) 37.5 MG 24 hr capsule Take 1 capsule (37.5 mg total) by mouth daily. To be taken with 150 mg 90 capsule 0  . venlafaxine XR (EFFEXOR-XR) 150 MG 24 hr capsule TAKE ONE CAPSULE BY MOUTH DAILY WITH BREAKFAST( COMBINE WITH 37.5 MG) 30 capsule 0  . venlafaxine XR (EFFEXOR-XR) 37.5 MG 24 hr capsule TAKE ONE CAPSULE BY MOUTH DAILY WITH BREAKFAST TO BE COMBINED WITH 150 MG 30 capsule 0  . Wheat Dextrin (BENEFIBER DRINK  MIX PO) Take by mouth.     No current facility-administered medications on file prior to visit.     There are no Patient Instructions on file for this visit. Return in about 6 months (around 02/12/2019).   Kris Hartmann, NP  This note was completed with Sales executive.  Any errors are purely unintentional.

## 2018-08-25 ENCOUNTER — Telehealth (INDEPENDENT_AMBULATORY_CARE_PROVIDER_SITE_OTHER): Payer: Self-pay | Admitting: Vascular Surgery

## 2018-08-25 NOTE — Telephone Encounter (Signed)
If the pumps are hurting while wearing the TED Hose, have the patient try using them without the ted hose.  Also if the pump is hurting as it is being used, she may want to use it for shorter periods such as 30 mins twice a day instead of an hour once a day.

## 2018-08-25 NOTE — Telephone Encounter (Signed)
I spoke with [name] and she stated the patient is already using the lymphedema pump 30 minutes twice a day and the pressure is set on 50.Eulogio Ditch NP gave a verbal order to decrease pressure settings to 35 and use 30 minutes twice a day.[name] has been informed with medical recommendations and she will inform the patient.

## 2018-09-21 ENCOUNTER — Telehealth: Payer: Self-pay | Admitting: Family Medicine

## 2018-09-21 NOTE — Telephone Encounter (Signed)
Tiffany with [facility] called saying Mrs. Koester is having some balance issues and she is asking for eval to treat with Physical Therapy  Fax to [phone]   Con Memos

## 2018-09-21 NOTE — Telephone Encounter (Signed)
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 I would really like her to do at least a telephone establish care visit as she has a lot of comorbidities so I can better understand the issues she is having before placing a referral.

## 2018-09-22 ENCOUNTER — Ambulatory Visit (INDEPENDENT_AMBULATORY_CARE_PROVIDER_SITE_OTHER): Payer: Medicare Other | Admitting: Physician Assistant

## 2018-09-22 DIAGNOSIS — E782 Mixed hyperlipidemia: Secondary | ICD-10-CM

## 2018-09-22 DIAGNOSIS — N183 Chronic kidney disease, stage 3 unspecified: Secondary | ICD-10-CM

## 2018-09-22 DIAGNOSIS — F331 Major depressive disorder, recurrent, moderate: Secondary | ICD-10-CM | POA: Diagnosis not present

## 2018-09-22 DIAGNOSIS — E890 Postprocedural hypothyroidism: Secondary | ICD-10-CM | POA: Diagnosis not present

## 2018-09-22 DIAGNOSIS — K589 Irritable bowel syndrome without diarrhea: Secondary | ICD-10-CM

## 2018-09-22 DIAGNOSIS — R2689 Other abnormalities of gait and mobility: Secondary | ICD-10-CM

## 2018-09-22 DIAGNOSIS — I872 Venous insufficiency (chronic) (peripheral): Secondary | ICD-10-CM

## 2018-09-22 NOTE — Progress Notes (Signed)
[name] Female    DOB: 05/31/1926   85 y.o.   MRN: [id number] Visit Date: 09/22/2018  Today's Provider: Trinna Post, PA-C   Chief Complaint  Patient presents with  . Referral   Subjective:    Virtual Visit via Telephone Note  I connected with Sandra Brown on 09/22/18 at 11:20 AM EDT by telephone and verified that I am speaking with the correct person using two identifiers.  Location: Patient: Home, [place] Provider: Office   I discussed the limitations, risks, security and privacy concerns of performing an evaluation and management service by telephone and the availability of in person appointments. I also discussed with the patient that there may be a patient responsible charge related to this service. The patient expressed understanding and agreed to proceed.     HPI   Patient requesting referral to rehab at [facility]. Patient reports physical therapy is in house at [facility]. Patient reports needing PT for balance issues. She walks with a rolling walker. Her daughter Sandra Brown is on her DPR. Sh ehas been at lock down at [place] since [place]. She has not been getting her normal exercise. She uses a rollator and is scared of falling. History of falls: she fell six weeks ago out of bed reaching for an object. Has fallen prior into the closet bu has not injured herself during either of these falls apart from skin cares. Not having any dizziness., issues with her eyes. Denies numbness and tingling in her legs but has some in her feet.   Hypothyroidism: Last TSH normal 08/2016. Currently taking 50 mcg synthroid daily without issues.   CKD:   CMP Latest Ref Rng & Units 11/24/2014  Glucose 65 - 99 mg/dL 90  BUN 8 - 27 mg/dL 23  Creatinine 0.57 - 1.00 mg/dL 1.23(H)  Sodium 136 - 144 mmol/L 147(H)  Potassium 3.5 - 5.2 mmol/L 5.6(H)  Chloride 97 - 106 mmol/L 105  CO2 18 - 29 mmol/L 26  Calcium 8.7 - 10.3 mg/dL 9.6  Total Protein 6.0 - 8.5  g/dL 6.7  Total Bilirubin 0.0 - 1.2 mg/dL 0.5  Alkaline Phos 39 - 117 IU/L 127(H)  AST 0 - 40 IU/L 20  ALT 0 - 32 IU/L 11   COPD: Currently using albuterol PRN and Spiriva.  Chronic Venous Insufficiency: She is taking spironolactone 25 mg daily and Lasix 40 mg daily for this.   Depression and Anxiety: She is on wellbutrin and effexor for depression and anxiety. She reports occasional anxiety for which she previously took 0.25 mg Xanax PRN. She has a prescription for hydroxyzine 12.5 mg daily which she was not aware of.  Allergies  Allergen Reactions  . Sulfa Antibiotics Rash and Itching    Other reaction(s): Diarrhea and vomiting (finding)  . Erythromycin Nausea And Vomiting    Other reaction(s): Diarrhea and vomiting (finding)  . Aspirin Other (See Comments), Tinitus and Nausea And Vomiting    Ringing of the ears, caused hearing loss both ears Ringing in ears  . Contrast Media [Iodinated Diagnostic Agents] Rash and Hives  . Latex Rash and Itching  . Penicillin G Rash  . Penicillins Rash    Other reaction(s): UNKNOWN  . Tape Rash    Other reaction(s): UNKNOWN Adhesive Other reaction(s): UNKNOWN Adhesive     Current Outpatient Medications:  .  acetaminophen (TYLENOL) 500 MG tablet, Take 1 tablet (500 mg total) by mouth every 6 (six) hours  as needed for moderate pain. Also give 500 mg three times a day for 7  Days., Disp: 60 tablet, Rfl: 0 .  albuterol (VENTOLIN HFA) 108 (90 Base) MCG/ACT inhaler, Inhale 2 puffs into the lungs every 6 (six) hours as needed for wheezing or shortness of breath., Disp: 8 g, Rfl: 2 .  AMBULATORY NON FORMULARY MEDICATION, Medication Name: incentive spirometry Use as directed, Disp: 1 each, Rfl: 0 .  buPROPion (WELLBUTRIN) 75 MG tablet, TAKE 1 TABLET EVERY MORNING, Disp: 90 tablet, Rfl: 3 .  buPROPion (WELLBUTRIN) 75 MG tablet, Take 1 tablet (75 mg total) by mouth every morning., Disp: 30 tablet, Rfl: 0 .  CALCIUM CITRATE PO, Take by mouth., Disp: ,  Rfl:  .  celecoxib (CELEBREX) 200 MG capsule, TAKE 1 CAPSULE DAILY, Disp: 90 capsule, Rfl: 3 .  cholestyramine (QUESTRAN) 4 g packet, ADD 1 PACKET TO 8OZ OF FLUID AND DRINK 1-2 TIMES A DAY AS NEEDED LOOSE STOOLS, Disp: 180 each, Rfl: 1 .  Cyanocobalamin (VITAMIN B12) 500 MCG TABS, Take by mouth., Disp: , Rfl:  .  diphenoxylate-atropine (LOMOTIL) 2.5-0.025 MG tablet, Take by mouth. Take 1 tablet by mouth 2 (two) times daily as needed for Diarrhea As need abdominal pain/diarrhea., Disp: , Rfl:  .  doxycycline (VIBRAMYCIN) 100 MG capsule, Take 1 capsule (100 mg total) by mouth 2 (two) times daily., Disp: 20 capsule, Rfl: 0 .  eszopiclone (LUNESTA) 1 MG TABS tablet, Take 1 tablet (1 mg total) by mouth at bedtime. Take immediately before bedtime for sleep, Disp: 90 tablet, Rfl: 1 .  fluticasone (FLONASE) 50 MCG/ACT nasal spray, USE 2 SPRAYS NASALLY DAILY, Disp: 48 g, Rfl: 2 .  furosemide (LASIX) 40 MG tablet, TAKE 1 TABLET DAILY, Disp: 90 tablet, Rfl: 4 .  hydrOXYzine (ATARAX/VISTARIL) 25 MG tablet, Take 0.5 tablets (12.5 mg total) by mouth as directed. 2-3 times a week - Only for severe panic attacks, Disp: 45 tablet, Rfl: 1 .  levocetirizine (XYZAL) 5 MG tablet, TAKE 1 TABLET AT BEDTIME EVERY NIGHT, Disp: 90 tablet, Rfl: 4 .  levothyroxine (SYNTHROID, LEVOTHROID) 50 MCG tablet, TAKE 1 TABLET DAILY, Disp: 90 tablet, Rfl: 4 .  lidocaine (LIDODERM) 5 %, Place 1 patch onto the skin daily. Remove & Discard patch within 12 hours or as directed by MD, Disp: 30 patch, Rfl: 5 .  loperamide (IMODIUM) 2 MG capsule, Take 2 mg by mouth as needed for diarrhea or loose stools., Disp: , Rfl:  .  magic mouthwash SOLN, Swish and spit 5 ml twice daily until healed, Disp: 240 mL, Rfl: 0 .  magnesium gluconate (MAGONATE) 500 MG tablet, Take 400 mg by mouth daily. , Disp: , Rfl:  .  meclizine (ANTIVERT) 12.5 MG tablet, Take 12.5 mg by mouth every 4 (four) hours as needed for dizziness., Disp: , Rfl:  .  Multiple Vitamin  (MULTIVITAMIN) tablet, Take 1 tablet by mouth daily., Disp: , Rfl:  .  nystatin cream (MYCOSTATIN), Apply 1 application topically 2 (two) times daily., Disp: 30 g, Rfl: 0 .  OSCIMIN 0.125 MG tablet, TAKE 1 TABLET TWICE A DAY, Disp: 180 tablet, Rfl: 3 .  pantoprazole (PROTONIX) 20 MG tablet, Take 1 tablet (20 mg total) by mouth daily., Disp: 90 tablet, Rfl: 1 .  Probiotic Product (PROBIOTIC COLON SUPPORT PO), Take 1 capsule by mouth daily., Disp: , Rfl:  .  simvastatin (ZOCOR) 20 MG tablet, TAKE 1 TABLET DAILY, Disp: 90 tablet, Rfl: 4 .  spironolactone (ALDACTONE) 25 MG tablet,  TAKE 1 TABLET DAILY, Disp: 90 tablet, Rfl: 4 .  sucralfate (CARAFATE) 1 g tablet, May take before meals, Disp: 21 tablet, Rfl: 0 .  tamsulosin (FLOMAX) 0.4 MG CAPS capsule, TAKE 1 CAPSULE DAILY, Disp: 90 capsule, Rfl: 4 .  tiotropium (SPIRIVA HANDIHALER) 18 MCG inhalation capsule, Place 1 capsule (18 mcg total) into inhaler and inhale daily., Disp: 90 capsule, Rfl: 3 .  triamcinolone cream (KENALOG) 0.1 %, APP BID UNTIL CLEAR/FLAT, Disp: , Rfl: 0 .  venlafaxine XR (EFFEXOR XR) 37.5 MG 24 hr capsule, Take 1 capsule (37.5 mg total) by mouth daily. To be taken with 150 mg, Disp: 90 capsule, Rfl: 0 .  venlafaxine XR (EFFEXOR-XR) 150 MG 24 hr capsule, TAKE ONE CAPSULE BY MOUTH DAILY WITH BREAKFAST( COMBINE WITH 37.5 MG), Disp: 30 capsule, Rfl: 0 .  venlafaxine XR (EFFEXOR-XR) 150 MG 24 hr capsule, TAKE 1 CAPSULE DAILY, TO BE TAKEN WITH 37.5 MG, Disp: 90 capsule, Rfl: 3 .  venlafaxine XR (EFFEXOR-XR) 37.5 MG 24 hr capsule, TAKE ONE CAPSULE BY MOUTH DAILY WITH BREAKFAST TO BE COMBINED WITH 150 MG, Disp: 30 capsule, Rfl: 0 .  Wheat Dextrin (BENEFIBER DRINK MIX PO), Take by mouth., Disp: , Rfl:   Review of Systems  Social History   Tobacco Use  . Smoking status: Never Smoker  . Smokeless tobacco: Never Used  . Tobacco comment: quit 1954  Substance Use Topics  . Alcohol use: No      Objective:   There were no vitals taken  for this visit. There were no vitals filed for this visit.There is no height or weight on file to calculate BMI.   Physical Exam   No results found for any visits on 09/22/18.     Assessment & Plan    1. Imbalance  Have faxed over written Rx for PT.  2. CKD (chronic kidney disease) stage 3, GFR 30-59 ml/min (HCC)  Faxed over lab orders for CMET, TSH, Lipid profile, and CBC to [facility].  3. Mixed hyperlipidemia   4. Irritable bowel syndrome, unspecified type   5. Postoperative hypothyroidism  Currently taking synthroid 50 mcg daily. Check TSH today.  6. Chronic venous insufficiency  She will have a nurse fax an updated medication list. Continue spironolactone and Lasix for now.   7. Moderate episode of recurrent major depressive disorder ([place])  She asks about PRN anxiety medication. I have let her know she has a prescription for hydroxyzine for this which she did not appear to be aware of. I have spelled this out for her and let her know it is written for her to have 2-3 times per week. She is agreeable to trying this.   The entirety of the information documented in the History of Present Illness, Review of Systems and Physical Exam were personally obtained by me. Portions of this information were initially documented by Lynford Humphrey, CMA and reviewed by me for thoroughness and accuracy.   I discussed the assessment and treatment plan with the patient. The patient was provided an opportunity to ask questions and all were answered. The patient agreed with the plan and demonstrated an understanding of the instructions.   The patient was advised to call back or seek an in-person evaluation if the symptoms worsen or if the condition fails to improve as anticipated.     Trinna Post, PA-C  [facility]

## 2018-09-24 ENCOUNTER — Encounter: Payer: Self-pay | Admitting: Physician Assistant

## 2018-09-25 ENCOUNTER — Telehealth: Payer: Self-pay

## 2018-09-25 ENCOUNTER — Encounter (INDEPENDENT_AMBULATORY_CARE_PROVIDER_SITE_OTHER): Payer: Medicare Other | Admitting: Physician Assistant

## 2018-09-25 DIAGNOSIS — H00015 Hordeolum externum left lower eyelid: Secondary | ICD-10-CM | POA: Diagnosis not present

## 2018-09-25 DIAGNOSIS — F330 Major depressive disorder, recurrent, mild: Secondary | ICD-10-CM

## 2018-09-25 MED ORDER — VENLAFAXINE HCL ER 37.5 MG PO CP24: ORAL_CAPSULE | ORAL | 0 refills | Status: DC

## 2018-09-25 MED ORDER — VENLAFAXINE HCL ER 37.5 MG PO CP24: 37.5000 mg | ORAL_CAPSULE | Freq: Every day | ORAL | 0 refills | Status: DC

## 2018-09-25 MED ORDER — ERYTHROMYCIN 5 MG/GM OP OINT: TOPICAL_OINTMENT | OPHTHALMIC | 0 refills | Status: DC

## 2018-09-25 NOTE — Telephone Encounter (Signed)
Subjective:  Patient ID: Sandra Brown, female    DOB: 1926/07/18, 85 y.o.   MRN: [id number]  Sandra Brown is a 85 y.o. female presenting on 09/25/2018 for stye.   This office visit was conducted through [employer]. Identity of patient was verified through the inherent security of MyChart.    Patient location: home Provider location: [place] office  Persons involved in the visit: patient, provider, daughter Sandra Brown  HPI  Patient is an 85 y/o woman presenting today with left eye lesion over the past several weeks. Daughter Sandra Brown reports her mother had contacted eye doctor who instructed warm compresses several times daily for a couple of weeks. This has provided minimal relief. Patient reports the lesion is not painful but it does itch. Denies drainage, vision change.   Social History   Tobacco Use  . Smoking status: Never Smoker  . Smokeless tobacco: Never Used  . Tobacco comment: quit 1954  Substance Use Topics  . Alcohol use: No  . Drug use: No    ROS Per HPI unless specifically indicated above   Objective:    Wt Readings from Last 3 Encounters:  08/12/18 117 lb (53.1 kg)  07/24/18 118 lb (53.5 kg)  07/08/18 119 lb (54 kg)    Physical Exam Results for orders placed or performed in visit on 11/23/14  CBC With Differential  Result Value Ref Range   WBC 8.0 3.4 - 10.8 x10E3/uL   RBC 4.20 3.77 - 5.28 x10E6/uL   Hemoglobin 12.6 11.1 - 15.9 g/dL   Hematocrit 38.4 34.0 - 46.6 %   MCV 91 79 - 97 fL   MCH 30.0 26.6 - 33.0 pg   MCHC 32.8 31.5 - 35.7 g/dL   RDW 14.4 12.3 - 15.4 %   Neutrophils 67 %   Lymphs 23 %   Monocytes 6 %   Eos 3 %   Basos 1 %   Neutrophils Absolute 5.4 1.4 - 7.0 x10E3/uL   Lymphocytes Absolute 1.9 0.7 - 3.1 x10E3/uL   Monocytes Absolute 0.5 0.1 - 0.9 x10E3/uL   EOS (ABSOLUTE) 0.2 0.0 - 0.4 x10E3/uL   Basophils Absolute 0.1 0.0 - 0.2 x10E3/uL   Immature Granulocytes 0 %   Immature Grans (Abs)  0.0 0.0 - 0.1 x10E3/uL  Comprehensive Metabolic Panel (CMET)  Result Value Ref Range   Glucose 90 65 - 99 mg/dL   BUN 23 8 - 27 mg/dL   Creatinine, Ser 1.23 (H) 0.57 - 1.00 mg/dL   GFR calc non Af Amer 40 (L) >59 mL/min/1.73   GFR calc Af Amer 46 (L) >59 mL/min/1.73   BUN/Creatinine Ratio 19 11 - 26   Sodium 147 (H) 136 - 144 mmol/L   Potassium 5.6 (H) 3.5 - 5.2 mmol/L   Chloride 105 97 - 106 mmol/L   CO2 26 18 - 29 mmol/L   Calcium 9.6 8.7 - 10.3 mg/dL   Total Protein 6.7 6.0 - 8.5 g/dL   Albumin 4.4 3.5 - 4.7 g/dL   Globulin, Total 2.3 1.5 - 4.5 g/dL   Albumin/Globulin Ratio 1.9 1.1 - 2.5   Bilirubin Total 0.5 0.0 - 1.2 mg/dL   Alkaline Phosphatase 127 (H) 39 - 117 IU/L   AST 20 0 - 40 IU/L   ALT 11 0 - 32 IU/L    Assessment & Plan:  1. Hordeolum externum of left lower eyelid  Counseled patient that there is not a lot of evidence to suggest antibiotic ointments have a  role in treating styes, though I don't think it unreasonable to try. Have sent in ointment as below. Recommend following up with ophthalmologist if not improving as lesion may need drainage.   - erythromycin ophthalmic ointment; Apply 0.5 inch ribbon to the eye four times daily for 5-7 days.  Dispense: 3.5 g; Refill: 0  Carles Collet, PA-C  [facility] 09/25/2018, 1:13 PM

## 2018-09-25 NOTE — Telephone Encounter (Signed)
Sent effexor to pharmacy - [facility] and 30 days to [facility] since she is completely out. Spoke to daughter -gretchen.

## 2018-09-25 NOTE — Telephone Encounter (Signed)
pt needs refill on effexor 37.5 she is completly out.

## 2018-09-30 ENCOUNTER — Ambulatory Visit (INDEPENDENT_AMBULATORY_CARE_PROVIDER_SITE_OTHER): Payer: Medicare Other | Admitting: Psychiatry

## 2018-09-30 ENCOUNTER — Other Ambulatory Visit: Payer: Self-pay

## 2018-09-30 ENCOUNTER — Encounter: Payer: Self-pay | Admitting: Psychiatry

## 2018-09-30 DIAGNOSIS — F5105 Insomnia due to other mental disorder: Secondary | ICD-10-CM

## 2018-09-30 DIAGNOSIS — F330 Major depressive disorder, recurrent, mild: Secondary | ICD-10-CM | POA: Insufficient documentation

## 2018-09-30 DIAGNOSIS — F424 Excoriation (skin-picking) disorder: Secondary | ICD-10-CM

## 2018-09-30 DIAGNOSIS — F3341 Major depressive disorder, recurrent, in partial remission: Secondary | ICD-10-CM | POA: Diagnosis not present

## 2018-09-30 DIAGNOSIS — G4709 Other insomnia: Secondary | ICD-10-CM | POA: Insufficient documentation

## 2018-09-30 NOTE — Progress Notes (Signed)
Virtual Visit via Video Note  I connected with Sandra Brown on 09/30/18 at  4:15 PM EDT by a video enabled telemedicine application and verified that I am speaking with the correct person using two identifiers.   I discussed the limitations of evaluation and management by telemedicine and the availability of in person appointments. The patient expressed understanding and agreed to proceed.   I discussed the assessment and treatment plan with the patient. The patient was provided an opportunity to ask questions and all were answered. The patient agreed with the plan and demonstrated an understanding of the instructions.   The patient was advised to call back or seek an in-person evaluation if the symptoms worsen or if the condition fails to improve as anticipated.  [place] MD OP Progress Note  09/30/2018 5:27 PM Sandra Brown  MRN:  [id number]  Chief Complaint:  Chief Complaint    Follow-up     HPI: Sandra Brown is an 85 year old Caucasian female, widowed, lives at [facility] assisted living facility, was evaluated by telemedicine today.  Patient with history of MDD, insomnia, skin picking disorder, COPD, hypothyroidism and hypertension.  Collateral information was obtained from [facility].  Per Sandra Brown patient is currently doing well.  Patient appeared to be alert, oriented to person place and situation.  She continues to struggle with some short-term memory loss.  She however reports her mood has been stable.  She does feel bored because of the COVID-19 lockdown.  Her daughter continues to visit her on a regular basis.  Patient reports sleep is good.  She reports her skin picking has been better.  She denies any suicidality, homicidality or perceptual disturbances.  Patient denies any other concerns today. Visit Diagnosis:    ICD-10-CM   1. MDD (major depressive disorder), recurrent, in partial remission ([place])  F33.41   2. Insomnia due to mental condition  F51.05   3.  Skin-picking disorder  F42.4     Past Psychiatric History: I have reviewed past psychiatric history from my progress note on 03/21/2017  Past Medical History:  Past Medical History:  Diagnosis Date  . Cataract   . COPD (chronic obstructive pulmonary disease) ([place])   . Depression   . Difficulty swallowing   . Frequent headaches   . Hearing loss   . Hypertension   . Hypothyroidism   . Reflux     Past Surgical History:  Procedure Laterality Date  . ABDOMINAL HYSTERECTOMY    . APPENDECTOMY    . LUMBAR LAMINECTOMY    . PARATHYROIDECTOMY    . TOTAL HIP ARTHROPLASTY     x 4    Family Psychiatric History: I have reviewed family psychiatric history from my progress note on 03/21/2017  Family History:  Family History  Problem Relation Age of Onset  . Stroke Mother   . Hypertension Mother   . Heart disease Father   . Hypertension Father     Social History: I have reviewed social history from my progress note on 03/21/2017 Social History   Socioeconomic History  . Marital status: Widowed    Spouse name: Not on file  . Number of children: Not on file  . Years of education: Not on file  . Highest education level: Not on file  Occupational History  . Not on file  Social Needs  . Financial resource strain: Not on file  . Food insecurity    Worry: Not on file    Inability: Not on file  . Transportation needs  Medical: Not on file    Non-medical: Not on file  Tobacco Use  . Smoking status: Never Smoker  . Smokeless tobacco: Never Used  . Tobacco comment: quit 1954  Substance and Sexual Activity  . Alcohol use: No  . Drug use: No  . Sexual activity: Never  Lifestyle  . Physical activity    Days per week: Not on file    Minutes per session: Not on file  . Stress: Not on file  Relationships  . Social Herbalist on phone: Not on file    Gets together: Not on file    Attends religious service: Not on file    Active member of club or organization: Not on  file    Attends meetings of clubs or organizations: Not on file    Relationship status: Not on file  Other Topics Concern  . Not on file  Social History Narrative  . Not on file    Allergies:  Allergies  Allergen Reactions  . Sulfa Antibiotics Rash and Itching    Other reaction(s): Diarrhea and vomiting (finding)  . Erythromycin Nausea And Vomiting    Other reaction(s): Diarrhea and vomiting (finding)  . Aspirin Other (See Comments), Tinitus and Nausea And Vomiting    Ringing of the ears, caused hearing loss both ears Ringing in ears  . Contrast Media [Iodinated Diagnostic Agents] Rash and Hives  . Latex Rash and Itching  . Penicillin G Rash  . Penicillins Rash    Other reaction(s): UNKNOWN  . Tape Rash    Other reaction(s): UNKNOWN Adhesive Other reaction(s): UNKNOWN Adhesive    Metabolic Disorder Labs: No results found for: HGBA1C, MPG No results found for: PROLACTIN No results found for: CHOL, TRIG, HDL, CHOLHDL, VLDL, LDLCALC No results found for: TSH  Therapeutic Level Labs: No results found for: LITHIUM No results found for: VALPROATE No components found for:  CBMZ  Current Medications: Current Outpatient Medications  Medication Sig Dispense Refill  . acetaminophen (TYLENOL) 500 MG tablet Take 1 tablet (500 mg total) by mouth every 6 (six) hours as needed for moderate pain. Also give 500 mg three times a day for 7  Days. 60 tablet 0  . albuterol (VENTOLIN HFA) 108 (90 Base) MCG/ACT inhaler Inhale 2 puffs into the lungs every 6 (six) hours as needed for wheezing or shortness of breath. 8 g 2  . ALPRAZolam (XANAX) 0.25 MG tablet Take 0.25 mg by mouth at bedtime as needed for anxiety.    . AMBULATORY NON FORMULARY MEDICATION Medication Name: incentive spirometry Use as directed 1 each 0  . buPROPion (WELLBUTRIN) 75 MG tablet TAKE 1 TABLET EVERY MORNING 90 tablet 3  . buPROPion (WELLBUTRIN) 75 MG tablet Take 1 tablet (75 mg total) by mouth every morning. 30 tablet  0  . CALCIUM CITRATE PO Take by mouth.    . celecoxib (CELEBREX) 200 MG capsule TAKE 1 CAPSULE DAILY 90 capsule 3  . cholestyramine (QUESTRAN) 4 g packet ADD 1 PACKET TO 8OZ OF FLUID AND DRINK 1-2 TIMES A DAY AS NEEDED LOOSE STOOLS 180 each 1  . Cyanocobalamin (VITAMIN B12) 500 MCG TABS Take by mouth.    . diphenoxylate-atropine (LOMOTIL) 2.5-0.025 MG tablet Take by mouth. Take 1 tablet by mouth 2 (two) times daily as needed for Diarrhea As need abdominal pain/diarrhea.    . doxycycline (VIBRAMYCIN) 100 MG capsule Take 1 capsule (100 mg total) by mouth 2 (two) times daily. 20 capsule 0  . erythromycin  ophthalmic ointment Apply 0.5 inch ribbon to the eye four times daily for 5-7 days. 3.5 g 0  . fluticasone (FLONASE) 50 MCG/ACT nasal spray USE 2 SPRAYS NASALLY DAILY 48 g 2  . furosemide (LASIX) 40 MG tablet TAKE 1 TABLET DAILY 90 tablet 4  . hydrOXYzine (ATARAX/VISTARIL) 25 MG tablet Take 0.5 tablets (12.5 mg total) by mouth as directed. 2-3 times a week - Only for severe panic attacks 45 tablet 1  . levocetirizine (XYZAL) 5 MG tablet TAKE 1 TABLET AT BEDTIME EVERY NIGHT 90 tablet 4  . levothyroxine (SYNTHROID, LEVOTHROID) 50 MCG tablet TAKE 1 TABLET DAILY 90 tablet 4  . lidocaine (LIDODERM) 5 % Place 1 patch onto the skin daily. Remove & Discard patch within 12 hours or as directed by MD 30 patch 5  . loperamide (IMODIUM) 2 MG capsule Take 2 mg by mouth as needed for diarrhea or loose stools.    . magic mouthwash SOLN Swish and spit 5 ml twice daily until healed 240 mL 0  . magnesium gluconate (MAGONATE) 500 MG tablet Take 400 mg by mouth daily.     . meclizine (ANTIVERT) 12.5 MG tablet Take 12.5 mg by mouth every 4 (four) hours as needed for dizziness.    . montelukast (SINGULAIR) 10 MG tablet Take 10 mg by mouth at bedtime.    . Multiple Vitamin (MULTIVITAMIN) tablet Take 1 tablet by mouth daily.    Marland Kitchen nystatin cream (MYCOSTATIN) Apply 1 application topically 2 (two) times daily. 30 g 0  .  OSCIMIN 0.125 MG tablet TAKE 1 TABLET TWICE A DAY 180 tablet 3  . pantoprazole (PROTONIX) 20 MG tablet Take 1 tablet (20 mg total) by mouth daily. 90 tablet 1  . Probiotic Product (PROBIOTIC COLON SUPPORT PO) Take 1 capsule by mouth daily.    . ranitidine (ZANTAC) 75 MG tablet Take 75 mg by mouth 1 day or 1 dose.    . simvastatin (ZOCOR) 20 MG tablet TAKE 1 TABLET DAILY 90 tablet 4  . spironolactone (ALDACTONE) 25 MG tablet TAKE 1 TABLET DAILY 90 tablet 4  . sucralfate (CARAFATE) 1 g tablet May take before meals 21 tablet 0  . tamsulosin (FLOMAX) 0.4 MG CAPS capsule TAKE 1 CAPSULE DAILY 90 capsule 4  . tiotropium (SPIRIVA HANDIHALER) 18 MCG inhalation capsule Place 1 capsule (18 mcg total) into inhaler and inhale daily. 90 capsule 3  . triamcinolone cream (KENALOG) 0.1 % APP BID UNTIL CLEAR/FLAT  0  . venlafaxine XR (EFFEXOR XR) 37.5 MG 24 hr capsule Take 1 capsule (37.5 mg total) by mouth daily. To be taken with 150 mg 90 capsule 0  . venlafaxine XR (EFFEXOR-XR) 150 MG 24 hr capsule TAKE ONE CAPSULE BY MOUTH DAILY WITH BREAKFAST( COMBINE WITH 37.5 MG) 30 capsule 0  . venlafaxine XR (EFFEXOR-XR) 150 MG 24 hr capsule TAKE 1 CAPSULE DAILY, TO BE TAKEN WITH 37.5 MG 90 capsule 3  . venlafaxine XR (EFFEXOR-XR) 37.5 MG 24 hr capsule TAKE ONE CAPSULE BY MOUTH DAILY WITH BREAKFAST TO BE COMBINED WITH 150 MG 30 capsule 0  . Wheat Dextrin (BENEFIBER DRINK MIX PO) Take by mouth.     No current facility-administered medications for this visit.      Musculoskeletal: Strength & Muscle Tone: UTA Gait & Station: Observed as seated Patient leans: N/A  Psychiatric Specialty Exam: Review of Systems  Psychiatric/Behavioral: The patient is nervous/anxious.   All other systems reviewed and are negative.   There were no vitals taken for  this visit.There is no height or weight on file to calculate BMI.  General Appearance: Casual  Eye Contact:  Fair  Speech:  Clear and Coherent  Volume:  Normal  Mood:   Anxious improving  Affect:  Congruent  Thought Process:  Goal Directed and Descriptions of Associations: Intact  Orientation:  Full (Time, Place, and Person)  Thought Content: Logical   Suicidal Thoughts:  No  Homicidal Thoughts:  No  Memory:  Immediate;   Fair Recent;   Fair Remote;   Fair  Judgement:  Fair  Insight:  Fair  Psychomotor Activity:  Normal  Concentration:  Concentration: Fair and Attention Span: Fair  Recall:  [employer] of Knowledge: Fair  Language: Fair  Akathisia:  No  Handed:  Right  AIMS (if indicated):denies tremors, rigidity  Assets:  Communication Skills Desire for Improvement Social Support  ADL's:  Intact  Cognition: WNL  Sleep:  Fair   Screenings: PHQ2-9     Procedure visit from 09/04/2015 in [place] Procedure visit from 10/03/2014 in [place] Office Visit from 09/21/2014 in [place]  PHQ-2 Total Score  0  0  1       Assessment and Plan: Syan is an 85 year old Caucasian female who has a history of depression, COPD, hypothyroidism, hypertension was evaluated by telemedicine today.  Patient continues to make progress on the current medication regimen.  She however does have psychosocial stressors of the current COVID-19 pandemic although she is coping better.  Plan MDD- in partial remission Venlafaxine 187.5 mg p.o. daily Wellbutrin 75 mg p.o. daily  For skin picking disorder-improving We will continue to monitor closely  For insomnia-improving Continue melatonin as needed  Collateral information was obtained from [facility] as summarized above.  Follow-up in clinic in 3 months or sooner if needed.  December 30 at 4 PM  I have spent atleast 15 minutes non face to face with patient today. More than 50 % of the time was spent for psychoeducation and supportive psychotherapy and care  coordination. This note was generated in part or whole with voice recognition software. Voice recognition is usually quite accurate but there are transcription errors that can and very often do occur. I apologize for any typographical errors that were not detected and corrected.       Ursula Alert, MD 09/30/2018, 5:27 PM

## 2018-10-08 ENCOUNTER — Other Ambulatory Visit: Payer: Self-pay | Admitting: Physician Assistant

## 2018-10-08 DIAGNOSIS — M5136 Other intervertebral disc degeneration, lumbar region: Secondary | ICD-10-CM

## 2018-10-08 NOTE — Telephone Encounter (Signed)
[facility] faxed refill request for the following medications:  celecoxib (CELEBREX) 200 MG capsule   Please advise.  Thanks, [employer]

## 2018-10-09 NOTE — Telephone Encounter (Signed)
[facility]

## 2018-10-09 NOTE — Telephone Encounter (Signed)
I faxed an order over to [facility] to check this patient's labs including her kidney function. We don't have an updated kidney function on her for two years. Did they get this order? I have not received any results. Celebrex is not recommended to use for severe kidney impairment.

## 2018-10-13 ENCOUNTER — Telehealth: Payer: Self-pay | Admitting: Physician Assistant

## 2018-10-13 MED ORDER — CELECOXIB 200 MG PO CAPS: 200.0000 mg | ORAL_CAPSULE | Freq: Every day | ORAL | 0 refills | Status: DC

## 2018-10-13 NOTE — Telephone Encounter (Signed)
Sandra Brown with [facility] ask that [facility] fax Lab orders for pt to [phone].  They went to the PT dept and they cant use them because of the way they came through.  teri

## 2018-10-13 NOTE — Telephone Encounter (Signed)
Order refaxed.   Thanks,   -Mickel Baas

## 2018-10-13 NOTE — Telephone Encounter (Signed)
[name] from [place] reports that labs have not been done for patient in a while. They can draw labs and send [place] the results. Lab order can be faxed to [phone]. [name] reports that patient has been stable on Celebrex since 2017. [name] reports that she will send [place] an updated medication list.

## 2018-10-13 NOTE — Telephone Encounter (Signed)
Faxing orders off.

## 2018-10-15 DIAGNOSIS — F329 Major depressive disorder, single episode, unspecified: Secondary | ICD-10-CM | POA: Diagnosis not present

## 2018-10-15 DIAGNOSIS — J449 Chronic obstructive pulmonary disease, unspecified: Secondary | ICD-10-CM | POA: Diagnosis not present

## 2018-10-15 DIAGNOSIS — K219 Gastro-esophageal reflux disease without esophagitis: Secondary | ICD-10-CM | POA: Diagnosis not present

## 2018-10-15 DIAGNOSIS — I879 Disorder of vein, unspecified: Secondary | ICD-10-CM | POA: Diagnosis not present

## 2018-10-15 DIAGNOSIS — I10 Essential (primary) hypertension: Secondary | ICD-10-CM | POA: Diagnosis not present

## 2018-10-15 DIAGNOSIS — R519 Headache, unspecified: Secondary | ICD-10-CM | POA: Diagnosis not present

## 2018-10-15 DIAGNOSIS — K589 Irritable bowel syndrome without diarrhea: Secondary | ICD-10-CM | POA: Diagnosis not present

## 2018-10-15 LAB — BASIC METABOLIC PANEL
BUN: 37 — AB (ref 4–21)
Creatinine: 1.6 — AB (ref 0.5–1.1)
Glucose: 74
Potassium: 4.3 (ref 3.4–5.3)
Sodium: 143 (ref 137–147)

## 2018-10-15 LAB — TSH: TSH: 0.96 (ref <=5.90)

## 2018-10-15 LAB — CBC AND DIFFERENTIAL
HCT: 30 — AB (ref 36–46)
Hemoglobin: 9.8 — AB (ref 12.0–16.0)
Neutrophils Absolute: 3326
Platelets: 284 (ref 150–399)
WBC: 5.4

## 2018-10-15 LAB — HEPATIC FUNCTION PANEL
ALT: 11 (ref 7–35)
AST: 20 (ref 13–35)
Alkaline Phosphatase: 67 (ref 25–125)
Bilirubin, Total: 0.4

## 2018-10-22 ENCOUNTER — Encounter: Payer: Self-pay | Admitting: Physician Assistant

## 2018-10-22 ENCOUNTER — Telehealth: Payer: Self-pay | Admitting: Physician Assistant

## 2018-10-22 DIAGNOSIS — Z03818 Encounter for observation for suspected exposure to other biological agents ruled out: Secondary | ICD-10-CM | POA: Diagnosis not present

## 2018-10-22 DIAGNOSIS — M533 Sacrococcygeal disorders, not elsewhere classified: Secondary | ICD-10-CM

## 2018-10-22 MED ORDER — CELECOXIB 100 MG PO CAPS: 100.0000 mg | ORAL_CAPSULE | Freq: Every day | ORAL | 1 refills | Status: DC

## 2018-10-22 NOTE — Telephone Encounter (Signed)
Have reviewed recent lab draw for patient. Her kidney function is slightly worse than it was two years ago. This may be a combination of aging, dehydration and also the celebrex. I would like to change her to 100 mg celebrex daily and have her repeat labs in one month, making sure she is hydrating adequately. This patient lives at [facility] and I may need to send in a separate order form for the new celebrex and repeat labs. Can we confirm if this is the case?

## 2018-10-22 NOTE — Telephone Encounter (Signed)
[facility] Also wrote new order for celebrex on hard script and CMET as well. This will be faxed.

## 2018-10-22 NOTE — Telephone Encounter (Signed)
Sandra Brown [id number] from [facility] advised as below. Sandra Brown requesting that a new rx for [facility] And to fax new orders for medication and repeat lab to fax # [phone]

## 2018-10-22 NOTE — Addendum Note (Signed)
Addended by: Trinna Post on: 10/22/2018 02:29 PM   Modules accepted: Orders

## 2018-11-05 DIAGNOSIS — Z03818 Encounter for observation for suspected exposure to other biological agents ruled out: Secondary | ICD-10-CM | POA: Diagnosis not present

## 2018-12-02 ENCOUNTER — Other Ambulatory Visit: Payer: Self-pay

## 2018-12-07 LAB — CBC AND DIFFERENTIAL: Neutrophils Absolute: 3941

## 2018-12-07 LAB — HEPATIC FUNCTION PANEL: Bilirubin, Total: 0.3

## 2018-12-08 LAB — TSH: TSH: 2.47 (ref <=5.90)

## 2018-12-08 LAB — COMPREHENSIVE METABOLIC PANEL
Albumin: 3.7 (ref 3.5–5.0)
Calcium: 8.6 — AB (ref 8.7–10.7)
GFR calc Af Amer: 36
GFR calc non Af Amer: 31
Globulin: 2.1

## 2018-12-08 LAB — HEPATIC FUNCTION PANEL
ALT: 10 (ref 7–35)
AST: 15 (ref 13–35)
Alkaline Phosphatase: 68 (ref 25–125)

## 2018-12-08 LAB — BASIC METABOLIC PANEL
BUN: 36 — AB (ref 4–21)
Chloride: 107 (ref 99–108)
Creatinine: 1.5 — AB (ref <=1.1)
Glucose: 118
Potassium: 4.3 (ref 3.4–5.3)
Sodium: 142 (ref 137–147)

## 2018-12-08 LAB — CBC AND DIFFERENTIAL
HCT: 28 — AB (ref 36–46)
Hemoglobin: 8.7 — AB (ref 12.0–16.0)
Platelets: 348 (ref 150–399)
WBC: 5.9

## 2018-12-08 LAB — CBC: RBC: 3.09 — AB (ref 3.87–5.11)

## 2018-12-18 ENCOUNTER — Telehealth: Payer: Self-pay | Admitting: Physician Assistant

## 2018-12-18 DIAGNOSIS — D649 Anemia, unspecified: Secondary | ICD-10-CM

## 2018-12-18 NOTE — Telephone Encounter (Signed)
Received patient's bloodwork and it shows improved kidney function but lower hemoglobin from past month and labs prior to that. I would like to order CBC with diff and iron panel. These will need to be faxed to [facility]. I have signed the orders.

## 2018-12-18 NOTE — Telephone Encounter (Signed)
Order was faxed.

## 2018-12-22 ENCOUNTER — Other Ambulatory Visit: Payer: Self-pay | Admitting: Psychiatry

## 2018-12-22 ENCOUNTER — Other Ambulatory Visit: Payer: Self-pay | Admitting: Physician Assistant

## 2018-12-22 ENCOUNTER — Other Ambulatory Visit: Payer: Self-pay | Admitting: Family Medicine

## 2018-12-22 DIAGNOSIS — F330 Major depressive disorder, recurrent, mild: Secondary | ICD-10-CM

## 2018-12-22 DIAGNOSIS — M5136 Other intervertebral disc degeneration, lumbar region: Secondary | ICD-10-CM

## 2018-12-22 NOTE — Telephone Encounter (Signed)
Requested medication (s) are due for refill today: yes  Requested medication (s) are on the active medication list: yes  Last refill: 09/22/2018 historic med and provider  Future visit scheduled: no  Notes to clinic:  historic    Requested Prescriptions  Pending Prescriptions Disp Refills   montelukast (SINGULAIR) 10 MG tablet [Pharmacy Med Name: MONTELUKAST SODIUM TABS 10MG ] 90 tablet 3    Sig: TAKE 1 TABLET DAILY      Pulmonology:  Leukotriene Inhibitors Passed - 12/22/2018  6:46 AM      Passed - Valid encounter within last 12 months    Recent Outpatient Visits           3 months ago [place], Edgar, PA-C   1 year ago Venous stasis dermatitis of left lower extremity   [place], [place]   1 year ago COPD exacerbation [facility])   [place], [place]   1 year ago COPD exacerbation [facility])   [place], [place]   1 year ago Cellulitis of left lower extremity   [place], [place]

## 2018-12-22 NOTE — Telephone Encounter (Signed)
Requested medication (s) are due for refill today:no  Requested medication (s) are on the active medication list: no  Last refill:  10/13/2018  Future visit scheduled; no  Notes to clinic: medication was discontinued    Requested Prescriptions  Pending Prescriptions Disp Refills   celecoxib (CELEBREX) 200 MG capsule [Pharmacy Med Name: CELECOXIB CAPS 200MG ] 90 capsule 3    Sig: TAKE 1 CAPSULE DAILY      Analgesics:  COX2 Inhibitors Failed - 12/22/2018  6:46 AM      Failed - HGB in normal range and within 360 days    Hemoglobin  Date Value Ref Range Status  12/08/2018 8.7 (A) 12.0 - 16.0 Final  11/24/2014 12.6 11.1 - 15.9 g/dL Final          Failed - Cr in normal range and within 360 days    Creatinine  Date Value Ref Range Status  12/08/2018 1.5 (A) 0.5 - 1.1 Final   Creatinine, Ser  Date Value Ref Range Status  11/24/2014 1.23 (H) 0.57 - 1.00 mg/dL Final          Passed - Patient is not pregnant      Passed - Valid encounter within last 12 months    Recent Outpatient Visits           3 months ago [place], Adriana M, PA-C   1 year ago Venous stasis dermatitis of left lower extremity   [place], [place]   1 year ago COPD exacerbation [facility])   [place], [place]   1 year ago COPD exacerbation [facility])   [place], [place]   1 year ago Cellulitis of left lower extremity   [place], [place]

## 2018-12-24 ENCOUNTER — Telehealth: Payer: Self-pay

## 2018-12-24 ENCOUNTER — Telehealth: Payer: Self-pay | Admitting: Physician Assistant

## 2018-12-24 NOTE — Telephone Encounter (Signed)
Order given to medical records to be faxed.

## 2018-12-24 NOTE — Telephone Encounter (Signed)
Copied from [place] [id number]. Topic: General - Other >> Dec 24, 2018  3:43 PM Yvette Rack wrote: Reason for CRM: [name] called in and stated she faxed paperwork over with the patient's name at the top but she received it back asking for the name of the patient. [name] requests call back. Cb# [phone]

## 2018-12-24 NOTE — Telephone Encounter (Signed)
Order has been faxed. Thanks TNP

## 2018-12-24 NOTE — Telephone Encounter (Signed)
Order for COVID vaccine signed and should be faxed to [facility].

## 2018-12-25 ENCOUNTER — Other Ambulatory Visit: Payer: Self-pay | Admitting: Physician Assistant

## 2018-12-25 ENCOUNTER — Other Ambulatory Visit: Payer: Self-pay | Admitting: Family Medicine

## 2018-12-25 DIAGNOSIS — J309 Allergic rhinitis, unspecified: Secondary | ICD-10-CM

## 2018-12-25 NOTE — Telephone Encounter (Signed)
[facility] faxed refill request for the following medications:  tiotropium (SPIRIVA HANDIHALER) 18 MCG inhalation capsule  fluticasone (FLONASE) 50 MCG/ACT nasal spray   Please advise.

## 2018-12-25 NOTE — Telephone Encounter (Signed)
Im not sure? I recently ordered blood work including a CBC and an iron panel to check on her anemia. As long as that's been received and drawn I don't have any other questions or concerns.

## 2018-12-28 NOTE — Telephone Encounter (Signed)
Sandra Brown was advised via voicemail and was advised to call the office back if have any questions or concerns.

## 2018-12-28 NOTE — Telephone Encounter (Signed)
Have her blood counts and iron come back yet? I am concerned about refilling the celebrex with recent drop in hemoglobin.

## 2018-12-29 MED ORDER — FLUTICASONE PROPIONATE 50 MCG/ACT NA SUSP: NASAL | 2 refills | Status: DC

## 2018-12-29 MED ORDER — SPIRIVA HANDIHALER 18 MCG IN CAPS: 1.0000 | ORAL_CAPSULE | Freq: Every day | RESPIRATORY_TRACT | 3 refills | Status: DC

## 2019-01-05 NOTE — Telephone Encounter (Signed)
I've declined this refill before. I am concerned about the anemia most recently on her labs and I ordered a follow up CBC and iron panel. Has this been drawn? I would like the results.

## 2019-01-06 ENCOUNTER — Other Ambulatory Visit: Payer: Self-pay

## 2019-01-06 ENCOUNTER — Ambulatory Visit (INDEPENDENT_AMBULATORY_CARE_PROVIDER_SITE_OTHER): Payer: Medicare Other | Admitting: Psychiatry

## 2019-01-06 ENCOUNTER — Encounter: Payer: Self-pay | Admitting: Psychiatry

## 2019-01-06 DIAGNOSIS — F424 Excoriation (skin-picking) disorder: Secondary | ICD-10-CM | POA: Diagnosis not present

## 2019-01-06 DIAGNOSIS — F330 Major depressive disorder, recurrent, mild: Secondary | ICD-10-CM | POA: Diagnosis not present

## 2019-01-06 DIAGNOSIS — F5105 Insomnia due to other mental disorder: Secondary | ICD-10-CM

## 2019-01-06 MED ORDER — VENLAFAXINE HCL ER 75 MG PO CP24: 75.0000 mg | ORAL_CAPSULE | Freq: Every day | ORAL | 0 refills | Status: DC

## 2019-01-06 MED ORDER — BELSOMRA 10 MG PO TABS: 5.0000 mg | ORAL_TABLET | Freq: Every day | ORAL | 1 refills | Status: DC

## 2019-01-06 MED ORDER — BUPROPION HCL 100 MG PO TABS: 100.0000 mg | ORAL_TABLET | Freq: Every day | ORAL | 0 refills | Status: DC

## 2019-01-06 NOTE — Progress Notes (Signed)
Virtual Visit via Video Note  I connected with Sandra Brown on 01/06/19 at  4:00 PM EST by a video enabled telemedicine application and verified that I am speaking with the correct person using two identifiers.   I discussed the limitations of evaluation and management by telemedicine and the availability of in person appointments. The patient expressed understanding and agreed to proceed.     I discussed the assessment and treatment plan with the patient. The patient was provided an opportunity to ask questions and all were answered. The patient agreed with the plan and demonstrated an understanding of the instructions.   The patient was advised to call back or seek an in-person evaluation if the symptoms worsen or if the condition fails to improve as anticipated.   [place] MD OP Progress Note  01/06/2019 5:37 PM Sandra Brown  MRN:  [id number]  Chief Complaint:  Chief Complaint    Follow-up     HPI: Sandra Brown is a 85 year old Caucasian female, widowed, lives [facility] was evaluated by telemedicine today.  Patient has a history of MDD, insomnia, skin picking disorder, COPD, hypothyroidism and hypertension.  Collateral information was obtained from Sandra Brown-nurse at [id number].  Patient today appeared to be alert, oriented to person place time and situation.  Patient however reports that she has been feeling more and more depressed the past few weeks.  She reports due to the COVID-19 restrictions she is currently on lockdown.  She hence has to stay in her room and does not have any activities going on.  This makes her more and more sad.  She struggles with lack of motivation.  She also reports she struggles with low energy.  Patient reports she also has been struggling with sleep.  She reports she goes to bed at around 2 AM and wakes up around 6 AM.  She reports she has to take her medication at that time and then she goes back to bed and wakes up at around 10  AM.  Her sleep is hence interrupted.  She does not have a good sleep hygiene.  Patient reports recently she had her blood work-up done and she was found to have abnormal renal function.  Per her nurse Sandra Brown they repeated the renal function and it improved a little bit.  Her Celebrex dosage was changed due to the same.  Patient currently denies any suicidality, homicidality or perceptual disturbances.  Patient denies any other concerns today.   Visit Diagnosis:    ICD-10-CM   1. MDD (major depressive disorder), recurrent episode, mild (HCC)  F33.0 buPROPion (WELLBUTRIN) 100 MG tablet    venlafaxine XR (EFFEXOR-XR) 75 MG 24 hr capsule    Suvorexant (BELSOMRA) 10 MG TABS  2. Insomnia due to mental condition  F51.05 Suvorexant (BELSOMRA) 10 MG TABS  3. Skin-picking disorder  F42.4     Past Psychiatric History: I have reviewed past psychiatric history from my progress note on 03/21/2017.  Past Medical History:  Past Medical History:  Diagnosis Date  . Cataract   . COPD (chronic obstructive pulmonary disease) ([place])   . Depression   . Difficulty swallowing   . Frequent headaches   . Hearing loss   . Hypertension   . Hypothyroidism   . Reflux     Past Surgical History:  Procedure Laterality Date  . ABDOMINAL HYSTERECTOMY    . APPENDECTOMY    . LUMBAR LAMINECTOMY    . PARATHYROIDECTOMY    . TOTAL HIP ARTHROPLASTY  x 4    Family Psychiatric History: Reviewed family psychiatric history from my progress note on 03/21/2017.  Family History:  Family History  Problem Relation Age of Onset  . Stroke Mother   . Hypertension Mother   . Heart disease Father   . Hypertension Father     Social History: Reviewed social history from my progress note on 03/21/2017. Social History   Socioeconomic History  . Marital status: Widowed    Spouse name: Not on file  . Number of children: Not on file  . Years of education: Not on file  . Highest education level: Not on file  Occupational  History  . Not on file  Tobacco Use  . Smoking status: Never Smoker  . Smokeless tobacco: Never Used  . Tobacco comment: quit 1954  Substance and Sexual Activity  . Alcohol use: No  . Drug use: No  . Sexual activity: Never  Other Topics Concern  . Not on file  Social History Narrative  . Not on file   Social Determinants of Health   Financial Resource Strain:   . Difficulty of Paying Living Expenses: Not on file  Food Insecurity:   . Worried About Charity fundraiser in the Last Year: Not on file  . Ran Out of Food in the Last Year: Not on file  Transportation Needs:   . Lack of Transportation (Medical): Not on file  . Lack of Transportation (Non-Medical): Not on file  Physical Activity:   . Days of Exercise per Week: Not on file  . Minutes of Exercise per Session: Not on file  Stress:   . Feeling of Stress : Not on file  Social Connections:   . Frequency of Communication with Friends and Family: Not on file  . Frequency of Social Gatherings with Friends and Family: Not on file  . Attends Religious Services: Not on file  . Active Member of Clubs or Organizations: Not on file  . Attends Archivist Meetings: Not on file  . Marital Status: Not on file    Allergies:  Allergies  Allergen Reactions  . Sulfa Antibiotics Rash and Itching    Other reaction(s): Diarrhea and vomiting (finding)  . Erythromycin Nausea And Vomiting    Other reaction(s): Diarrhea and vomiting (finding)  . Aspirin Other (See Comments), Tinitus and Nausea And Vomiting    Ringing of the ears, caused hearing loss both ears Ringing in ears  . Contrast Media [Iodinated Diagnostic Agents] Rash and Hives  . Latex Rash and Itching  . Penicillin G Rash  . Penicillins Rash    Other reaction(s): UNKNOWN  . Tape Rash    Other reaction(s): UNKNOWN Adhesive Other reaction(s): UNKNOWN Adhesive    Metabolic Disorder Labs: No results found for: HGBA1C, MPG No results found for: PROLACTIN No  results found for: CHOL, TRIG, HDL, CHOLHDL, VLDL, LDLCALC Lab Results  Component Value Date   TSH 2.47 12/08/2018   TSH 0.96 10/15/2018    Therapeutic Level Labs: No results found for: LITHIUM No results found for: VALPROATE No components found for:  CBMZ  Current Medications: Current Outpatient Medications  Medication Sig Dispense Refill  . acetaminophen (TYLENOL) 500 MG tablet Take 1 tablet (500 mg total) by mouth every 6 (six) hours as needed for moderate pain. Also give 500 mg three times a day for 7  Days. 60 tablet 0  . albuterol (VENTOLIN HFA) 108 (90 Base) MCG/ACT inhaler Inhale 2 puffs into the lungs every 6 (six)  hours as needed for wheezing or shortness of breath. 8 g 2  . AMBULATORY NON FORMULARY MEDICATION Medication Name: incentive spirometry Use as directed 1 each 0  . CALCIUM CITRATE PO Take by mouth.    . celecoxib (CELEBREX) 100 MG capsule Take 1 capsule (100 mg total) by mouth daily. 90 capsule 1  . Cyanocobalamin (VITAMIN B12) 500 MCG TABS Take by mouth.    . fluticasone (FLONASE) 50 MCG/ACT nasal spray USE 2 SPRAYS NASALLY DAILY 48 g 2  . furosemide (LASIX) 40 MG tablet TAKE 1 TABLET DAILY 90 tablet 4  . hydrOXYzine (ATARAX/VISTARIL) 25 MG tablet Take 0.5 tablets (12.5 mg total) by mouth as directed. 2-3 times a week - Only for severe panic attacks 45 tablet 1  . levocetirizine (XYZAL) 5 MG tablet TAKE 1 TABLET AT BEDTIME EVERY NIGHT 90 tablet 3  . levothyroxine (SYNTHROID, LEVOTHROID) 50 MCG tablet TAKE 1 TABLET DAILY 90 tablet 4  . lidocaine (LIDODERM) 5 % Place 1 patch onto the skin daily. Remove & Discard patch within 12 hours or as directed by MD 30 patch 5  . loperamide (IMODIUM) 2 MG capsule Take 2 mg by mouth as needed for diarrhea or loose stools.    . magic mouthwash SOLN Swish and spit 5 ml twice daily until healed 240 mL 0  . magnesium gluconate (MAGONATE) 500 MG tablet Take 400 mg by mouth daily.     . montelukast (SINGULAIR) 10 MG tablet TAKE 1  TABLET DAILY 90 tablet 3  . Multiple Vitamin (MULTIVITAMIN) tablet Take 1 tablet by mouth daily.    Marland Kitchen nystatin cream (MYCOSTATIN) Apply 1 application topically 2 (two) times daily. 30 g 0  . pantoprazole (PROTONIX) 20 MG tablet Take 1 tablet (20 mg total) by mouth daily. 90 tablet 1  . Probiotic Product (PROBIOTIC COLON SUPPORT PO) Take 1 capsule by mouth daily.    . simvastatin (ZOCOR) 20 MG tablet TAKE 1 TABLET DAILY 90 tablet 4  . spironolactone (ALDACTONE) 25 MG tablet TAKE 1 TABLET DAILY 90 tablet 4  . tamsulosin (FLOMAX) 0.4 MG CAPS capsule TAKE 1 CAPSULE DAILY 90 capsule 4  . tiotropium (SPIRIVA HANDIHALER) 18 MCG inhalation capsule Place 1 capsule (18 mcg total) into inhaler and inhale daily. 90 capsule 3  . triamcinolone cream (KENALOG) 0.1 % APP BID UNTIL CLEAR/FLAT  0  . Wheat Dextrin (BENEFIBER DRINK MIX PO) Take by mouth.    . ALPRAZolam (XANAX) 0.25 MG tablet Take 0.25 mg by mouth at bedtime as needed for anxiety.    Marland Kitchen buPROPion (WELLBUTRIN) 100 MG tablet Take 1 tablet (100 mg total) by mouth daily. In the morning 90 tablet 0  . cholestyramine (QUESTRAN) 4 g packet ADD 1 PACKET TO 8OZ OF FLUID AND DRINK 1-2 TIMES A DAY AS NEEDED LOOSE STOOLS (Patient not taking: Reported on 01/06/2019) 180 each 1  . diphenoxylate-atropine (LOMOTIL) 2.5-0.025 MG tablet Take by mouth. Take 1 tablet by mouth 2 (two) times daily as needed for Diarrhea As need abdominal pain/diarrhea.    . doxycycline (VIBRAMYCIN) 100 MG capsule Take 1 capsule (100 mg total) by mouth 2 (two) times daily. (Patient not taking: Reported on 01/06/2019) 20 capsule 0  . erythromycin ophthalmic ointment Apply 0.5 inch ribbon to the eye four times daily for 5-7 days. (Patient not taking: Reported on 01/06/2019) 3.5 g 0  . meclizine (ANTIVERT) 12.5 MG tablet Take 12.5 mg by mouth every 4 (four) hours as needed for dizziness.    Marland Kitchen  OSCIMIN 0.125 MG tablet TAKE 1 TABLET TWICE A DAY (Patient not taking: Reported on 01/06/2019) 180  tablet 3  . ranitidine (ZANTAC) 75 MG tablet Take 75 mg by mouth 1 day or 1 dose.    . sucralfate (CARAFATE) 1 g tablet May take before meals (Patient not taking: Reported on 01/06/2019) 21 tablet 0  . Suvorexant (BELSOMRA) 10 MG TABS Take 5-10 mg by mouth at bedtime. 30 tablet 1  . venlafaxine XR (EFFEXOR-XR) 75 MG 24 hr capsule Take 1 capsule (75 mg total) by mouth daily with breakfast. 90 capsule 0   No current facility-administered medications for this visit.     Musculoskeletal: Strength & Muscle Tone: UTA Gait & Station: Observed as seated Patient leans: N/A  Psychiatric Specialty Exam: Review of Systems  Constitutional: Positive for fatigue.  Psychiatric/Behavioral: Positive for dysphoric mood and sleep disturbance.  All other systems reviewed and are negative.   There were no vitals taken for this visit.There is no height or weight on file to calculate BMI.  General Appearance: Casual  Eye Contact:  Fair  Speech:  Clear and Coherent  Volume:  Normal  Mood:  Depressed  Affect:  Congruent  Thought Process:  Goal Directed and Descriptions of Associations: Intact  Orientation:  Full (Time, Place, and Person)  Thought Content: Logical   Suicidal Thoughts:  No  Homicidal Thoughts:  No  Memory:  Immediate;   Fair Recent;   Fair Remote;   Fair  Judgement:  Fair  Insight:  Fair  Psychomotor Activity:  Normal  Concentration:  Concentration: Fair and Attention Span: Fair  Recall:  [employer] of Knowledge: Fair  Language: Fair  Akathisia:  No  Handed:  Right  AIMS (if indicated): denies tremors, rigidity  Assets:  Communication Skills Desire for Improvement Housing Social Support  ADL's:  Intact  Cognition: WNL  Sleep:  Poor   Screenings: PHQ2-9     Procedure visit from 09/04/2015 in [place] Procedure visit from 10/03/2014 in [place] Office Visit from 09/21/2014 in [place]  PHQ-2 Total Score  0  0  1       Assessment and Plan: [place] is an 85 year old Caucasian female who has a history of depression, COPD, hypothyroidism, hypertension was evaluated by telemedicine today.  Patient is currently struggling with depressive symptoms, lack of energy and sleep problems.  Patient's current depressive symptoms may be multifactorial including the current COVID-19 restrictions, her own health problems.  Patient will benefit from the following medication changes.  Plan MDD-unstable Reduce Venlafaxine  XRto 75 mg p.o. daily The dosage was reduced due to her recent renal function changes. We will increase Wellbutrin slightly higher to 100 mg since venlafaxine is being reduced. However if she continues to have renal function abnormalities, the Wellbutrin dosage also may have to be reduced.  She will continue to follow-up with her primary care provider.  Skin picking disorder-improving We will continue to monitor closely  Insomnia-unstable Start Belsomra 5 to 10 mg p.o. nightly. Discussed sleep hygiene techniques.  Collateral information was obtained from [facility] as summarized above.  I have reviewed her most recent labs-GFR-31, improved from 40 from her previous lab. AST ALT-within normal limits BUN-36-improving, creatinine-1.5-improving. She will continue to follow up with PMD.  Patient will also benefit from CBT.   Follow-up in clinic in 3 to 4 weeks or sooner if needed.  January 26  at 3 PM  I have spent atleast 25 minutes non face to face with patient today. More than 50 % of the time was spent for psychoeducation and supportive psychotherapy and care coordination. This note was generated in part or whole with voice recognition software. Voice recognition is usually quite accurate but there are transcription errors that can and very often do occur. I apologize for any typographical errors that were not  detected and corrected.       Ursula Alert, MD 01/06/2019, 5:37 PM

## 2019-01-07 NOTE — Telephone Encounter (Signed)
Patient was advised and states that she will contact the nurse at [facility] to see if they drawn labs for her CBC and iron panel. She will have the nurse give the office a call back.FYI

## 2019-01-11 ENCOUNTER — Telehealth: Payer: Self-pay

## 2019-01-11 NOTE — Telephone Encounter (Signed)
prior Sandra Brown was form was completed and faxed- pending

## 2019-01-11 NOTE — Telephone Encounter (Signed)
Thank you :)

## 2019-01-11 NOTE — Telephone Encounter (Signed)
received a prior auth request form for suvorexant (belsomra)

## 2019-01-12 LAB — CBC AND DIFFERENTIAL
HCT: 28 — AB (ref 36–46)
Hemoglobin: 8.5 — AB (ref 12.0–16.0)
Neutrophils Absolute: 5047
Platelets: 341 (ref 150–399)
WBC: 7

## 2019-01-12 LAB — IRON,TIBC AND FERRITIN PANEL
Ferritin: 18
Iron: 22

## 2019-01-12 LAB — CBC: RBC: 3.17 — AB (ref 3.87–5.11)

## 2019-01-13 ENCOUNTER — Telehealth: Payer: Self-pay | Admitting: Physician Assistant

## 2019-01-13 DIAGNOSIS — D508 Other iron deficiency anemias: Secondary | ICD-10-CM

## 2019-01-13 NOTE — Telephone Encounter (Signed)
Reviewing patient's labwork that has come in. Hemoglobin in 2016 was normal but as of October 2020 it was 9.8 and 12/08/2018 it was 8.7. Recheck shows hemoglobin at 8.5 on 01/12/2019. Her serum iron and ferritin are low. When this happens, we can be concerned for bleeding. She has been on celebrex which can place people at risks for ulcers and bleeding.  Has she been dizzy, passing out or seeing blood in her stool?   I recommend we hold the celebrex and start her on ferrous sulfate 325 mg three times daily. I think she would benefit from seeing hematology and oncology about this, especially if she is having any of the above symptoms.   If she does not want to see them just yet, I think we should replace her iron, hold the celebrex and check her CBC in one month to see that her blood counts are improving.   Let me know what she would like and where to send the iron pills to.

## 2019-01-14 NOTE — Telephone Encounter (Signed)
[name] nurse at [facility] called to advise Fabio Bering that patient was seen on 01/06/2019 by her psychiatry and her Wellbutrin increased to 100MG , Effexor decreased to 75 MG.FYI

## 2019-01-14 NOTE — Telephone Encounter (Signed)
Can you please write order for ferrous sulfate 325 mg TID, #90 for iron deficiency anemia. Order to hold celebrex. Also order for CBC with with diff, Fe + TIBC + Fer in one month. Can we write on hard script and have another provider to sign and fax to [facility] as I am in another clinic.

## 2019-01-14 NOTE — Addendum Note (Signed)
Addended by: Mar Daring on: 01/14/2019 04:39 PM   Modules accepted: Orders

## 2019-01-14 NOTE — Telephone Encounter (Signed)
Lab slip printed to be faxed. Other orders written and faxed.

## 2019-01-14 NOTE — Telephone Encounter (Signed)
Patient and daughter was advised. Patient states that she is not having any dizzy, passing out or blood in stool. Patient daughter states that a order have to be send to [facility] to hold the Celebrex and started Ferrous Sulfate 325 MG. Also to have the CBC rechecked.Please advise.

## 2019-01-15 ENCOUNTER — Telehealth: Payer: Self-pay

## 2019-01-15 NOTE — Telephone Encounter (Signed)
Copied from [place] [id number]. Topic: General - Other >> Jan 15, 2019  9:55 AM Rainey Pines A wrote: Adora Fridge and stated that the med changes are to [facility] and and that only 1 st page of documents are being faxed over from both machines. [name] is requesting a callback from [place] today. Best contact number [phone]

## 2019-01-15 NOTE — Telephone Encounter (Signed)
Sandra Brown w/ [facility] call was returned.

## 2019-01-18 ENCOUNTER — Telehealth: Payer: Self-pay | Admitting: Physician Assistant

## 2019-01-18 NOTE — Telephone Encounter (Signed)
Called [name] from [facility] to advise on Madhavi, left message.

## 2019-01-18 NOTE — Telephone Encounter (Signed)
She can do some miralax or metamucil. If they need orders to be faxed, then I will fax them.

## 2019-01-18 NOTE — Telephone Encounter (Addendum)
Sandra Brown with [facility] calling to discuss pt's order for iron, and iron is constipating, pt has belly issues.  Would like a call back to discuss the best way to manage this.  [phone]  Sandra Brown [facility]

## 2019-01-22 ENCOUNTER — Telehealth: Payer: Self-pay | Admitting: Physician Assistant

## 2019-02-02 ENCOUNTER — Other Ambulatory Visit: Payer: Self-pay

## 2019-02-02 ENCOUNTER — Encounter: Payer: Self-pay | Admitting: Psychiatry

## 2019-02-02 ENCOUNTER — Ambulatory Visit (INDEPENDENT_AMBULATORY_CARE_PROVIDER_SITE_OTHER): Payer: Medicare Other | Admitting: Psychiatry

## 2019-02-02 DIAGNOSIS — F3341 Major depressive disorder, recurrent, in partial remission: Secondary | ICD-10-CM

## 2019-02-02 DIAGNOSIS — F5105 Insomnia due to other mental disorder: Secondary | ICD-10-CM | POA: Diagnosis not present

## 2019-02-02 DIAGNOSIS — F424 Excoriation (skin-picking) disorder: Secondary | ICD-10-CM | POA: Diagnosis not present

## 2019-02-02 NOTE — Progress Notes (Signed)
Virtual Visit via Telephone Note  I connected with [name] on 02/02/19 at  3:00 PM EST by telephone and verified that I am speaking with the correct person using two identifiers.   I discussed the limitations, risks, security and privacy concerns of performing an evaluation and management service by telephone and the availability of in person appointments. I also discussed with the patient that there may be a patient responsible charge related to this service. The patient expressed understanding and agreed to proceed.      I discussed the assessment and treatment plan with the patient. The patient was provided an opportunity to ask questions and all were answered. The patient agreed with the plan and demonstrated an understanding of the instructions.   The patient was advised to call back or seek an in-person evaluation if the symptoms worsen or if the condition fails to improve as anticipated.   [place] MD OP Progress Note  02/02/2019 5:32 PM VALYNN BEHNEN  MRN:  [id number]  Chief Complaint:  Chief Complaint    Follow-up     HPI: Sandra Brown is a 86 year old Caucasian female, widowed, lives [facility] [place] [facility] was evaluated by telemedicine today.  Patient with history of MDD, insomnia, skin picking disorder, COPD, hypothyroidism, hypertension and recent renal function abnormalities.  Patient today reports she is currently making progress with regards to her mood symptoms.  She reports the pandemic restrictions does affect her however she has been coping okay.  She reports the Belsomra does help her to sleep.  Once she goes to bed she can sleep for 6 to 7 hours which is an improvement.  She however reports she does not have a set bedtime however she is working on the same.  She has been teaching herself to go to bed earlier today by day.  Patient denies any suicidality, homicidality or perceptual disturbances.  She appeared to be alert and oriented to person  place and situation.  She was able to answer questions appropriately.  Collateral information was obtained from Audrey-nurse at [id number] who reported patient is doing well.  Her Celebrex was recently completely discontinued due to renal function changes.  She has blood work ordered to monitor her renal function due the beginning of February.  She agrees to fax the results to [place] once its reported.   Visit Diagnosis:    ICD-10-CM   1. MDD (major depressive disorder), recurrent, in partial remission ([place])  F33.41   2. Insomnia due to mental condition  F51.05   3. Skin-picking disorder  F42.4     Past Psychiatric History: I have reviewed past psychiatric history from my progress note on 03/21/2017.  Past Medical History:  Past Medical History:  Diagnosis Date  . Cataract   . COPD (chronic obstructive pulmonary disease) ([place])   . Depression   . Difficulty swallowing   . Frequent headaches   . Hearing loss   . Hypertension   . Hypothyroidism   . Reflux     Past Surgical History:  Procedure Laterality Date  . ABDOMINAL HYSTERECTOMY    . APPENDECTOMY    . LUMBAR LAMINECTOMY    . PARATHYROIDECTOMY    . TOTAL HIP ARTHROPLASTY     x 4    Family Psychiatric History: Reviewed family psychiatric history from my progress note on 03/21/2017.  Family History:  Family History  Problem Relation Age of Onset  . Stroke Mother   . Hypertension Mother   . Heart disease Father   .  Hypertension Father     Social History: Reviewed social history from my progress note on 03/21/2017. Social History   Socioeconomic History  . Marital status: Widowed    Spouse name: Not on file  . Number of children: Not on file  . Years of education: Not on file  . Highest education level: Not on file  Occupational History  . Not on file  Tobacco Use  . Smoking status: Never Smoker  . Smokeless tobacco: Never Used  . Tobacco comment: quit 1954  Substance and Sexual Activity  . Alcohol use: No  .  Drug use: No  . Sexual activity: Never  Other Topics Concern  . Not on file  Social History Narrative  . Not on file   Social Determinants of Health   Financial Resource Strain:   . Difficulty of Paying Living Expenses: Not on file  Food Insecurity:   . Worried About Charity fundraiser in the Last Year: Not on file  . Ran Out of Food in the Last Year: Not on file  Transportation Needs:   . Lack of Transportation (Medical): Not on file  . Lack of Transportation (Non-Medical): Not on file  Physical Activity:   . Days of Exercise per Week: Not on file  . Minutes of Exercise per Session: Not on file  Stress:   . Feeling of Stress : Not on file  Social Connections:   . Frequency of Communication with Friends and Family: Not on file  . Frequency of Social Gatherings with Friends and Family: Not on file  . Attends Religious Services: Not on file  . Active Member of Clubs or Organizations: Not on file  . Attends Archivist Meetings: Not on file  . Marital Status: Not on file    Allergies:  Allergies  Allergen Reactions  . Sulfa Antibiotics Rash and Itching    Other reaction(s): Diarrhea and vomiting (finding)  . Erythromycin Nausea And Vomiting    Other reaction(s): Diarrhea and vomiting (finding)  . Aspirin Other (See Comments), Tinitus and Nausea And Vomiting    Ringing of the ears, caused hearing loss both ears Ringing in ears  . Contrast Media [Iodinated Diagnostic Agents] Rash and Hives  . Latex Rash and Itching  . Penicillin G Rash  . Penicillins Rash    Other reaction(s): UNKNOWN  . Tape Rash    Other reaction(s): UNKNOWN Adhesive Other reaction(s): UNKNOWN Adhesive    Metabolic Disorder Labs: No results found for: HGBA1C, MPG No results found for: PROLACTIN No results found for: CHOL, TRIG, HDL, CHOLHDL, VLDL, LDLCALC Lab Results  Component Value Date   TSH 2.47 12/08/2018   TSH 0.96 10/15/2018    Therapeutic Level Labs: No results found for:  LITHIUM No results found for: VALPROATE No components found for:  CBMZ  Current Medications: Current Outpatient Medications  Medication Sig Dispense Refill  . acetaminophen (TYLENOL) 500 MG tablet Take 1 tablet (500 mg total) by mouth every 6 (six) hours as needed for moderate pain. Also give 500 mg three times a day for 7  Days. 60 tablet 0  . albuterol (VENTOLIN HFA) 108 (90 Base) MCG/ACT inhaler Inhale 2 puffs into the lungs every 6 (six) hours as needed for wheezing or shortness of breath. 8 g 2  . ALPRAZolam (XANAX) 0.25 MG tablet Take 0.25 mg by mouth at bedtime as needed for anxiety.    . AMBULATORY NON FORMULARY MEDICATION Medication Name: incentive spirometry Use as directed 1 each 0  .  buPROPion (WELLBUTRIN) 100 MG tablet Take 1 tablet (100 mg total) by mouth daily. In the morning 90 tablet 0  . CALCIUM CITRATE PO Take by mouth.    . cholestyramine (QUESTRAN) 4 g packet ADD 1 PACKET TO 8OZ OF FLUID AND DRINK 1-2 TIMES A DAY AS NEEDED LOOSE STOOLS 180 each 1  . Cyanocobalamin (VITAMIN B12) 500 MCG TABS Take by mouth.    . diphenoxylate-atropine (LOMOTIL) 2.5-0.025 MG tablet Take by mouth. Take 1 tablet by mouth 2 (two) times daily as needed for Diarrhea As need abdominal pain/diarrhea.    . doxycycline (VIBRAMYCIN) 100 MG capsule Take 1 capsule (100 mg total) by mouth 2 (two) times daily. 20 capsule 0  . fluticasone (FLONASE) 50 MCG/ACT nasal spray USE 2 SPRAYS NASALLY DAILY 48 g 2  . furosemide (LASIX) 40 MG tablet TAKE 1 TABLET DAILY 90 tablet 4  . hydrOXYzine (ATARAX/VISTARIL) 25 MG tablet Take 0.5 tablets (12.5 mg total) by mouth as directed. 2-3 times a week - Only for severe panic attacks 45 tablet 1  . levocetirizine (XYZAL) 5 MG tablet TAKE 1 TABLET AT BEDTIME EVERY NIGHT 90 tablet 3  . levothyroxine (SYNTHROID, LEVOTHROID) 50 MCG tablet TAKE 1 TABLET DAILY 90 tablet 4  . lidocaine (LIDODERM) 5 % Place 1 patch onto the skin daily. Remove & Discard patch within 12 hours or as  directed by MD 30 patch 5  . loperamide (IMODIUM) 2 MG capsule Take 2 mg by mouth as needed for diarrhea or loose stools.    . magic mouthwash SOLN Swish and spit 5 ml twice daily until healed 240 mL 0  . magnesium gluconate (MAGONATE) 500 MG tablet Take 400 mg by mouth daily.     . meclizine (ANTIVERT) 12.5 MG tablet Take 12.5 mg by mouth every 4 (four) hours as needed for dizziness.    . Multiple Vitamin (MULTIVITAMIN) tablet Take 1 tablet by mouth daily.    Marland Kitchen nystatin cream (MYCOSTATIN) Apply 1 application topically 2 (two) times daily. 30 g 0  . OSCIMIN 0.125 MG tablet TAKE 1 TABLET TWICE A DAY 180 tablet 3  . pantoprazole (PROTONIX) 20 MG tablet Take 1 tablet (20 mg total) by mouth daily. 90 tablet 1  . Probiotic Product (PROBIOTIC COLON SUPPORT PO) Take 1 capsule by mouth daily.    . ranitidine (ZANTAC) 75 MG tablet Take 75 mg by mouth 1 day or 1 dose.    . simvastatin (ZOCOR) 20 MG tablet TAKE 1 TABLET DAILY 90 tablet 4  . spironolactone (ALDACTONE) 25 MG tablet TAKE 1 TABLET DAILY 90 tablet 4  . Suvorexant (BELSOMRA) 10 MG TABS Take 5-10 mg by mouth at bedtime. 30 tablet 1  . tamsulosin (FLOMAX) 0.4 MG CAPS capsule TAKE 1 CAPSULE DAILY 90 capsule 4  . tiotropium (SPIRIVA HANDIHALER) 18 MCG inhalation capsule Place 1 capsule (18 mcg total) into inhaler and inhale daily. 90 capsule 3  . triamcinolone cream (KENALOG) 0.1 % APP BID UNTIL CLEAR/FLAT  0  . venlafaxine XR (EFFEXOR-XR) 75 MG 24 hr capsule Take 1 capsule (75 mg total) by mouth daily with breakfast. 90 capsule 0  . Wheat Dextrin (BENEFIBER DRINK MIX PO) Take by mouth.    . erythromycin ophthalmic ointment Apply 0.5 inch ribbon to the eye four times daily for 5-7 days. (Patient not taking: Reported on 01/06/2019) 3.5 g 0  . sucralfate (CARAFATE) 1 g tablet May take before meals (Patient not taking: Reported on 01/06/2019) 21 tablet 0  No current facility-administered medications for this visit.     Musculoskeletal: Strength  & Muscle Tone: UTA Gait & Station: Walks with walker per report Patient leans: N/A  Psychiatric Specialty Exam: Review of Systems  Psychiatric/Behavioral: Positive for sleep disturbance (improving).  All other systems reviewed and are negative.   There were no vitals taken for this visit.There is no height or weight on file to calculate BMI.  General Appearance: UTA  Eye Contact:  UTA  Speech:  Clear and Coherent  Volume:  Normal  Mood:  Euthymic  Affect:  UTA  Thought Process:  Goal Directed and Descriptions of Associations: Intact  Orientation:  Other:  person, situation,date  Thought Content: Logical   Suicidal Thoughts:  No  Homicidal Thoughts:  No  Memory:  Immediate;   Fair Recent;   Fair Remote;   Fair  Judgement:  Fair  Insight:  Fair  Psychomotor Activity:  UTA  Concentration:  Concentration: Fair and Attention Span: Fair  Recall:  [employer] of Knowledge: Fair  Language: Fair  Akathisia:  No  Handed:  Right  AIMS (if indicated): denies tremors, rigidity  Assets:  Communication Skills Desire for Improvement Housing Social Support  ADL's:  Intact  Cognition: WNL  Sleep:  Improving   Screenings: PHQ2-9     Procedure visit from 09/04/2015 in [facility] Procedure visit from 10/03/2014 in [facility] Office Visit from 09/21/2014 in [facility]  PHQ-2 Total Score  0  0  1       Assessment and Plan: Sandra Brown is a 86 year old Caucasian female who has a history of depression, COPD, hypothyroidism, hypertension, renal function abnormalities was evaluated by phone today.  Patient is currently making progress on the current medication regimen.  Plan as noted below.  Plan MDD-improving Venlafaxine extended release at reduced dosage of 75 mg p.o. daily Wellbutrin 100 mg p.o. daily.  Skin picking disorder-improving We will continue to  monitor closely  Insomnia-improving Belsomra 5 to 10 mg p.o. nightly Patient to continue to work on sleep hygiene techniques  Collateral information was obtained from nurse-Audery was able to verify all medications as summarized above.  Also agrees to fax her most recent medication list was.  Follow-up in clinic in 6 weeks or sooner if needed.  March 9 at 11 AM  I have spent atleast 20 minutes non face to face with patient today. More than 50 % of the time was spent for obtaining and to review and separately obtained history , ordering medications and test ,psychoeducation and supportive psychotherapy and care coordination,as well as documenting clinical information in electronic health record. This note was generated in part or whole with voice recognition software. Voice recognition is usually quite accurate but there are transcription errors that can and very often do occur. I apologize for any typographical errors that were not detected and corrected.       Ursula Alert, MD 02/02/2019, 5:32 PM

## 2019-02-04 ENCOUNTER — Telehealth: Payer: Self-pay | Admitting: Physician Assistant

## 2019-02-04 NOTE — Telephone Encounter (Signed)
How is she doing with the iron pills? Has she given any more though about seeing hematology/oncology about her blood counts? If she does not want to see them I think we should have her do an OC light to check on any blood in her stool. A family member could pick it up for her and they could complete the test at home.

## 2019-02-04 NOTE — Telephone Encounter (Signed)
Patient states that she does not like the iron pills but they seem to be ok. She states that she has not been to a hematology/oncology due to restriction at [facility].FYI

## 2019-02-11 LAB — IRON,TIBC AND FERRITIN PANEL
Ferritin: 33
Iron: 28

## 2019-02-11 LAB — CBC AND DIFFERENTIAL
HCT: 27 — AB (ref 36–46)
Hemoglobin: 8.9 — AB (ref 12.0–16.0)
Neutrophils Absolute: 3023
Platelets: 324 (ref 150–399)
WBC: 4.9

## 2019-02-11 LAB — CBC: RBC: 3.11 — AB (ref 3.87–5.11)

## 2019-02-16 ENCOUNTER — Telehealth: Payer: Self-pay | Admitting: Physician Assistant

## 2019-02-16 ENCOUNTER — Telehealth: Payer: Self-pay

## 2019-02-16 ENCOUNTER — Ambulatory Visit (INDEPENDENT_AMBULATORY_CARE_PROVIDER_SITE_OTHER): Payer: Medicare Other | Admitting: Vascular Surgery

## 2019-02-16 NOTE — Telephone Encounter (Signed)
Copied from [place] [phone]. Topic: General - Other >> Feb 16, 2019 11:59 AM Yvette Rack wrote: Reason for CRM: [name] with [facility] stated that she needs to speak with Carles Collet regarding some labs that were sent to the practice. Cb# [phone]

## 2019-02-16 NOTE — Telephone Encounter (Signed)
Spoke with patient and she was advised of message. Patient states that she will speak with the nurse at [facility] before placing a referral with oncology.FYI

## 2019-02-16 NOTE — Telephone Encounter (Signed)
Received new blood results and fax from [facility]. Hemoglobin is 8.9% which is slightly improved but overall similar to last months level. Also saw that patient didn't want to take iron supplement. I'd recommend seeing oncology about the anemia as there can be multiple causes of this. If patient would like, I can place the referral. If she doesn't wish to do that, at the very least I think we should have her complete a OC light and somebody can pick it up for her to see if she is losing blood through her GI tract.

## 2019-02-17 NOTE — Telephone Encounter (Signed)
Just FYI.   Had discussion with nurse [name] about patient's labs, which show persistent and marked iron deficiency anemia. Discussed differential of anemia including chronic disease, blood loss, etc and the various issues which can cause this including age, malignancy, etc. Patient has had hard time tolerating iron supplements and has been declining them. Discussed how she could go to hem/onc and have this worked up or receive iron treatments. Patient is not inclined to do this as she reports having a workup at some point in the future and finding nothing. Talked about how we could at the very least do a hemoccult which would give [place] some valuable information. [name] reports she has some in her office and will discuss completing one with the patient.

## 2019-02-25 ENCOUNTER — Other Ambulatory Visit: Payer: Self-pay | Admitting: Physician Assistant

## 2019-02-25 DIAGNOSIS — R600 Localized edema: Secondary | ICD-10-CM

## 2019-02-25 NOTE — Telephone Encounter (Signed)
[facility] faxed refill request for the following medications:  spironolactone (ALDACTONE) 25 MG tablet  90 day supply Last Rx: 12/01/2017 [name]) LOV: 09/22/2018 Please advise. Thanks TNP

## 2019-02-26 ENCOUNTER — Telehealth: Payer: Self-pay | Admitting: Physician Assistant

## 2019-02-26 NOTE — Telephone Encounter (Signed)
Jake Michaelis Director of Nursing at [facility] is calling to report that stool for blood was negative. How do you want her to proceed? Please advise [phone]

## 2019-03-01 NOTE — Telephone Encounter (Signed)
We can continue to try iron supplement and check iron. If she would like to see hematology I am always happy to place the referral.

## 2019-03-01 NOTE — Telephone Encounter (Signed)
L.O.V. was on 09/22/2018. Medication was never filled by you, please advise.

## 2019-03-02 ENCOUNTER — Other Ambulatory Visit: Payer: Self-pay | Admitting: Physician Assistant

## 2019-03-02 DIAGNOSIS — R600 Localized edema: Secondary | ICD-10-CM

## 2019-03-02 MED ORDER — SPIRONOLACTONE 25 MG PO TABS: 25.0000 mg | ORAL_TABLET | Freq: Every day | ORAL | 4 refills | Status: DC

## 2019-03-02 MED ORDER — SPIRONOLACTONE 25 MG PO TABS: 25.0000 mg | ORAL_TABLET | Freq: Every day | ORAL | 0 refills | Status: DC

## 2019-03-02 NOTE — Telephone Encounter (Signed)
[facility] faxed refill request for the following medications:  spironolactone (ALDACTONE) 25 MG tablet   Please advise.  Thanks, [employer]

## 2019-03-04 ENCOUNTER — Other Ambulatory Visit: Payer: Self-pay | Admitting: Psychiatry

## 2019-03-04 DIAGNOSIS — F5105 Insomnia due to other mental disorder: Secondary | ICD-10-CM

## 2019-03-04 DIAGNOSIS — F330 Major depressive disorder, recurrent, mild: Secondary | ICD-10-CM

## 2019-03-11 ENCOUNTER — Telehealth: Payer: Self-pay | Admitting: Physician Assistant

## 2019-03-11 DIAGNOSIS — D509 Iron deficiency anemia, unspecified: Secondary | ICD-10-CM

## 2019-03-11 NOTE — Telephone Encounter (Signed)
[facility] w/ [facility] was advised of message and agrees with placing a refferal the patient to oncology for further evaluation.Please advise.

## 2019-03-11 NOTE — Telephone Encounter (Signed)
Adrianna from [facility] called - she is wanting to know what to do next for pt.  She obtained a stool and it was negative.  She would like to know what to tell family.  She is asking for a call back from nurse or provider. Please call [phone] Thanks

## 2019-03-11 NOTE — Telephone Encounter (Signed)
She can tell them it was negative for hidden blood in her GI tract. This is not necessarily a 100% accurate test so it will miss some cases of hidden blood. Her anemia persists and there aren't a whole lot of noninvasive options for me to work this up in an outpatient family medicine clinic. To further workup and especially treat if she is not able to tolerate PO iron, she would need to see oncology.

## 2019-03-11 NOTE — Telephone Encounter (Signed)
Spoke with [name] from [place]. Patient and her daughter Sandra Brown would like to pursue oncology referral about potential iron infusion. Advised I will place that referral with Audrey's number as the contact as daughter Sandra Brown is in [place].

## 2019-03-14 NOTE — Progress Notes (Signed)
[place]  [phone] [phone]  ID: Sandra Brown OB: 03-09-1926  MR#: [id number]  [id number]  Patient Care Team: Paulene Floor as PCP - General (Physician Assistant)  CHIEF COMPLAINT: Iron deficiency anemia.  INTERVAL HISTORY: Patient is a 86 year old female with a longstanding history of iron deficiency anemia.  She reports she is intolerant to oral iron supplementation.  She has chronic weakness and fatigue, but otherwise feels well.  She has no neurologic complaints.  She denies any recent fevers or illnesses.  She has a good appetite and denies weight loss.  She has no chest pain, shortness of breath, cough, or hemoptysis.  He denies any nausea, vomiting, constipation, or diarrhea.  She has no melena or hematochezia.  She has no urinary complaints.  Patient feels at her baseline and offers no further specific complaints today.  REVIEW OF SYSTEMS:   Review of Systems  Constitutional: Positive for malaise/fatigue. Negative for fever and weight loss.  Respiratory: Negative.  Negative for cough, hemoptysis and shortness of breath.   Cardiovascular: Negative.  Negative for chest pain and leg swelling.  Gastrointestinal: Negative.  Negative for abdominal pain, blood in stool and melena.  Genitourinary: Positive for hematuria.  Musculoskeletal: Negative.  Negative for back pain.  Skin: Negative.  Negative for rash.  Neurological: Positive for weakness. Negative for focal weakness and headaches.  Psychiatric/Behavioral: Negative.  The patient is not nervous/anxious.     As per HPI. Otherwise, a complete review of systems is negative.  PAST MEDICAL HISTORY: Past Medical History:  Diagnosis Date  . Cataract   . COPD (chronic obstructive pulmonary disease) ([place])   . Depression   . Difficulty swallowing   . Frequent headaches   . Hearing loss   . Hypertension   . Hypothyroidism   . Reflux     PAST SURGICAL  HISTORY: Past Surgical History:  Procedure Laterality Date  . ABDOMINAL HYSTERECTOMY    . APPENDECTOMY    . LUMBAR LAMINECTOMY    . PARATHYROIDECTOMY    . TOTAL HIP ARTHROPLASTY     x 4    FAMILY HISTORY: Family History  Problem Relation Age of Onset  . Stroke Mother   . Hypertension Mother   . Heart disease Father   . Hypertension Father     ADVANCED DIRECTIVES (Y/N):  N  HEALTH MAINTENANCE: Social History   Tobacco Use  . Smoking status: Never Smoker  . Smokeless tobacco: Never Used  . Tobacco comment: quit 1954  Substance Use Topics  . Alcohol use: No  . Drug use: No     Colonoscopy:  PAP:  Bone density:  Lipid panel:  Allergies  Allergen Reactions  . Sulfa Antibiotics Rash and Itching    Other reaction(s): Diarrhea and vomiting (finding)  . Erythromycin Nausea And Vomiting    Other reaction(s): Diarrhea and vomiting (finding)  . Aspirin Other (See Comments), Tinitus and Nausea And Vomiting    Ringing of the ears, caused hearing loss both ears Ringing in ears  . Contrast Media [Iodinated Diagnostic Agents] Rash and Hives  . Latex Rash and Itching  . Penicillin G Rash  . Penicillins Rash    Other reaction(s): UNKNOWN  . Tape Rash    Other reaction(s): UNKNOWN Adhesive Other reaction(s): UNKNOWN Adhesive    Current Outpatient Medications  Medication Sig Dispense Refill  . acetaminophen (TYLENOL) 500 MG tablet Take 1 tablet (500 mg total) by mouth every 6 (six) hours as needed for moderate  pain. Also give 500 mg three times a day for 7  Days. 60 tablet 0  . albuterol (VENTOLIN HFA) 108 (90 Base) MCG/ACT inhaler Inhale 2 puffs into the lungs every 6 (six) hours as needed for wheezing or shortness of breath. 8 g 2  . ALPRAZolam (XANAX) 0.25 MG tablet Take 0.25 mg by mouth at bedtime as needed for anxiety.    . AMBULATORY NON FORMULARY MEDICATION Medication Name: incentive spirometry Use as directed 1 each 0  . buPROPion (WELLBUTRIN) 100 MG tablet Take  1 tablet (100 mg total) by mouth daily. In the morning 90 tablet 1  . CALCIUM CITRATE PO Take by mouth.    . cholestyramine (QUESTRAN) 4 g packet ADD 1 PACKET TO 8OZ OF FLUID AND DRINK 1-2 TIMES A DAY AS NEEDED LOOSE STOOLS 180 each 1  . Cyanocobalamin (VITAMIN B12) 500 MCG TABS Take by mouth.    . diphenoxylate-atropine (LOMOTIL) 2.5-0.025 MG tablet Take by mouth. Take 1 tablet by mouth 2 (two) times daily as needed for Diarrhea As need abdominal pain/diarrhea.    . fluticasone (FLONASE) 50 MCG/ACT nasal spray USE 2 SPRAYS NASALLY DAILY 48 g 2  . furosemide (LASIX) 40 MG tablet TAKE 1 TABLET DAILY 90 tablet 4  . hydrOXYzine (ATARAX/VISTARIL) 25 MG tablet Take 0.5 tablets (12.5 mg total) by mouth as directed. 2-3 times a week - Only for severe panic attacks 45 tablet 1  . levocetirizine (XYZAL) 5 MG tablet TAKE 1 TABLET AT BEDTIME EVERY NIGHT 90 tablet 3  . levothyroxine (SYNTHROID, LEVOTHROID) 50 MCG tablet TAKE 1 TABLET DAILY 90 tablet 4  . loperamide (IMODIUM) 2 MG capsule Take 2 mg by mouth as needed for diarrhea or loose stools.    . magnesium gluconate (MAGONATE) 500 MG tablet Take 400 mg by mouth daily.     . meclizine (ANTIVERT) 12.5 MG tablet Take 12.5 mg by mouth every 4 (four) hours as needed for dizziness.    . Multiple Vitamin (MULTIVITAMIN) tablet Take 1 tablet by mouth daily.    . pantoprazole (PROTONIX) 20 MG tablet Take 1 tablet (20 mg total) by mouth daily. 90 tablet 1  . Probiotic Product (PROBIOTIC COLON SUPPORT PO) Take 1 capsule by mouth daily.    . simvastatin (ZOCOR) 20 MG tablet TAKE 1 TABLET DAILY 90 tablet 4  . spironolactone (ALDACTONE) 25 MG tablet Take 1 tablet (25 mg total) by mouth daily. 90 tablet 0  . tamsulosin (FLOMAX) 0.4 MG CAPS capsule TAKE 1 CAPSULE DAILY 90 capsule 4  . tiotropium (SPIRIVA HANDIHALER) 18 MCG inhalation capsule Place 1 capsule (18 mcg total) into inhaler and inhale daily. 90 capsule 3  . venlafaxine XR (EFFEXOR-XR) 75 MG 24 hr capsule Take  1 capsule (75 mg total) by mouth daily with breakfast. 90 capsule 1  . Suvorexant (BELSOMRA) 10 MG TABS Take 10 mg by mouth at bedtime. (Patient not taking: Reported on 03/19/2019) 90 tablet 1   No current facility-administered medications for this visit.    OBJECTIVE: Vitals:   03/19/19 1120  BP: (!) 128/48  Pulse: (!) 101  Resp: 18  Temp: (!) 97.2 F (36.2 C)  SpO2: 100%     Body mass index is 22.89 kg/m.    ECOG FS:1 - Symptomatic but completely ambulatory  General: Well-developed, well-nourished, no acute distress. Eyes: Pink conjunctiva, anicteric sclera. HEENT: Normocephalic, moist mucous membranes. Lungs: No audible wheezing or coughing. Heart: Regular rate and rhythm. Abdomen: Soft, nontender, no obvious distention. Musculoskeletal: No  edema, cyanosis, or clubbing. Neuro: Alert, answering all questions appropriately. Cranial nerves grossly intact. Skin: No rashes or petechiae noted. Psych: Normal affect. Lymphatics: No cervical, calvicular, axillary or inguinal LAD.   LAB RESULTS:  Lab Results  Component Value Date   NA 142 12/08/2018   K 4.3 12/08/2018   CL 107 12/08/2018   CO2 26 11/24/2014   GLUCOSE 90 11/24/2014   BUN 36 (A) 12/08/2018   CREATININE 1.5 (A) 12/08/2018   CALCIUM 8.6 (A) 12/08/2018   PROT 6.7 11/24/2014   ALBUMIN 3.7 12/08/2018   AST 15 12/08/2018   ALT 10 12/08/2018   ALKPHOS 68 12/08/2018   BILITOT 0.5 11/24/2014   GFRNONAA 31 12/08/2018   GFRAA 36 12/08/2018    Lab Results  Component Value Date   WBC 6.6 03/19/2019   NEUTROABS 3,023 02/11/2019   HGB 10.0 (L) 03/19/2019   HCT 32.9 (L) 03/19/2019   MCV 89.6 03/19/2019   PLT 324 03/19/2019     STUDIES: No results found.  ASSESSMENT: Iron deficiency anemia.  PLAN:    1. Iron deficiency anemia: Patient's hemoglobin has slightly improved to 10.0.  Previously, her ferritin was within normal limits but she was noted to have a decreased iron saturation.  Her reticulocyte count  is inappropriately normal for this level of anemia.  All of her other laboratory work including repeat iron panel and [employer] myeloid panel are pending at time of dictation.  Patient reports she cannot tolerate oral iron supplementation.  She will return to clinic in 3 weeks for further evaluation, discussion of her laboratory work, and initiation of IV Feraheme if needed.   2.  Hematuria: Patient reports this is chronic and unchanged for decades and no etiology has ever been determined.  I spent a total of 45 minutes reviewing chart data, face-to-face evaluation with the patient, counseling and coordination of care as detailed above.  Patient expressed understanding and was in agreement with this plan. She also understands that She can call clinic at any time with any questions, concerns, or complaints.    Lloyd Huger, MD   03/19/2019 12:54 PM

## 2019-03-16 ENCOUNTER — Encounter: Payer: Self-pay | Admitting: Psychiatry

## 2019-03-16 ENCOUNTER — Other Ambulatory Visit: Payer: Self-pay

## 2019-03-16 ENCOUNTER — Ambulatory Visit (INDEPENDENT_AMBULATORY_CARE_PROVIDER_SITE_OTHER): Payer: Medicare Other | Admitting: Psychiatry

## 2019-03-16 DIAGNOSIS — F410 Panic disorder [episodic paroxysmal anxiety] without agoraphobia: Secondary | ICD-10-CM | POA: Insufficient documentation

## 2019-03-16 DIAGNOSIS — F424 Excoriation (skin-picking) disorder: Secondary | ICD-10-CM

## 2019-03-16 DIAGNOSIS — F3342 Major depressive disorder, recurrent, in full remission: Secondary | ICD-10-CM | POA: Diagnosis not present

## 2019-03-16 DIAGNOSIS — F5105 Insomnia due to other mental disorder: Secondary | ICD-10-CM

## 2019-03-16 MED ORDER — VENLAFAXINE HCL ER 75 MG PO CP24: 75.0000 mg | ORAL_CAPSULE | Freq: Every day | ORAL | 1 refills | Status: DC

## 2019-03-16 MED ORDER — BELSOMRA 10 MG PO TABS: 10.0000 mg | ORAL_TABLET | Freq: Every day | ORAL | 1 refills | Status: DC

## 2019-03-16 MED ORDER — BUPROPION HCL 100 MG PO TABS: 100.0000 mg | ORAL_TABLET | Freq: Every day | ORAL | 1 refills | Status: DC

## 2019-03-16 MED ORDER — HYDROXYZINE HCL 25 MG PO TABS: 12.5000 mg | ORAL_TABLET | ORAL | 1 refills | Status: DC

## 2019-03-16 NOTE — Progress Notes (Signed)
Provider Location : [facility] Patient Location : Home  Virtual Visit via Video Note- [id number] Sandra Brown- Nurse)  I connected with Sandra Brown on 03/16/19 at 11:00 AM EST by a video enabled telemedicine application and verified that I am speaking with the correct person using two identifiers.   I discussed the limitations of evaluation and management by telemedicine and the availability of in person appointments. The patient expressed understanding and agreed to proceed.     I discussed the assessment and treatment plan with the patient. The patient was provided an opportunity to ask questions and all were answered. The patient agreed with the plan and demonstrated an understanding of the instructions.   The patient was advised to call back or seek an in-person evaluation if the symptoms worsen or if the condition fails to improve as anticipated.   [place] MD OP Progress Note  03/16/2019 11:59 AM Sandra Brown  MRN:  [id number]  Chief Complaint:  Chief Complaint    Follow-up     HPI: Sandra Brown is a  86 year Caucasian female told, [facility] was evaluated by telemedicine today.  Patient with history of MDD, insomnia, skin picking disorder, COPD, hypothyroidism, hypertension and recent renal function abnormalities.  Staff at [facility] -provided collateral information during the evaluation today.Per Sandra Brown patient is currently doing well.  She had recent low iron level and is scheduled to get iron infusions.  She otherwise is doing okay.  Patient reports her mood symptoms are stable.  She denies any significant depression or anxiety.  She reports her daughter is here to visit and that has been very helpful.  She reports that once she is able to go to bed she is able to fall asleep however she has difficulty following a good sleep routine and sometimes goes to bed very late.  She reports she is trying to work on her sleep hygiene.  She is tolerating the  Belsomra well and denies side effects.  Patient denies any suicidality, homicidality or perceptual disturbances.  Patient denies any other concerns today.   Visit Diagnosis:    ICD-10-CM   1. MDD (major depressive disorder), recurrent, in full remission ([place])  F33.42 buPROPion (WELLBUTRIN) 100 MG tablet    venlafaxine XR (EFFEXOR-XR) 75 MG 24 hr capsule    Suvorexant (BELSOMRA) 10 MG TABS  2. Insomnia due to mental condition  F51.05 hydrOXYzine (ATARAX/VISTARIL) 25 MG tablet  3. Skin-picking disorder  F42.4     Past Psychiatric History: I have reviewed past psychiatric history from my progress note on 03/21/2017.  Past Medical History:  Past Medical History:  Diagnosis Date  . Cataract   . COPD (chronic obstructive pulmonary disease) ([place])   . Depression   . Difficulty swallowing   . Frequent headaches   . Hearing loss   . Hypertension   . Hypothyroidism   . Reflux     Past Surgical History:  Procedure Laterality Date  . ABDOMINAL HYSTERECTOMY    . APPENDECTOMY    . LUMBAR LAMINECTOMY    . PARATHYROIDECTOMY    . TOTAL HIP ARTHROPLASTY     x 4    Family Psychiatric History: I have reviewed family psychiatric history from my progress note on 03/21/2017.  Family History:  Family History  Problem Relation Age of Onset  . Stroke Mother   . Hypertension Mother   . Heart disease Father   . Hypertension Father     Social History: Reviewed social history  from my progress note on 03/21/2017. Social History   Socioeconomic History  . Marital status: Widowed    Spouse name: Not on file  . Number of children: Not on file  . Years of education: Not on file  . Highest education level: Not on file  Occupational History  . Not on file  Tobacco Use  . Smoking status: Never Smoker  . Smokeless tobacco: Never Used  . Tobacco comment: quit 1954  Substance and Sexual Activity  . Alcohol use: No  . Drug use: No  . Sexual activity: Never  Other Topics Concern  . Not on  file  Social History Narrative  . Not on file   Social Determinants of Health   Financial Resource Strain:   . Difficulty of Paying Living Expenses: Not on file  Food Insecurity:   . Worried About Charity fundraiser in the Last Year: Not on file  . Ran Out of Food in the Last Year: Not on file  Transportation Needs:   . Lack of Transportation (Medical): Not on file  . Lack of Transportation (Non-Medical): Not on file  Physical Activity:   . Days of Exercise per Week: Not on file  . Minutes of Exercise per Session: Not on file  Stress:   . Feeling of Stress : Not on file  Social Connections:   . Frequency of Communication with Friends and Family: Not on file  . Frequency of Social Gatherings with Friends and Family: Not on file  . Attends Religious Services: Not on file  . Active Member of Clubs or Organizations: Not on file  . Attends Archivist Meetings: Not on file  . Marital Status: Not on file    Allergies:  Allergies  Allergen Reactions  . Sulfa Antibiotics Rash and Itching    Other reaction(s): Diarrhea and vomiting (finding)  . Erythromycin Nausea And Vomiting    Other reaction(s): Diarrhea and vomiting (finding)  . Aspirin Other (See Comments), Tinitus and Nausea And Vomiting    Ringing of the ears, caused hearing loss both ears Ringing in ears  . Contrast Media [Iodinated Diagnostic Agents] Rash and Hives  . Latex Rash and Itching  . Penicillin G Rash  . Penicillins Rash    Other reaction(s): UNKNOWN  . Tape Rash    Other reaction(s): UNKNOWN Adhesive Other reaction(s): UNKNOWN Adhesive    Metabolic Disorder Labs: No results found for: HGBA1C, MPG No results found for: PROLACTIN No results found for: CHOL, TRIG, HDL, CHOLHDL, VLDL, LDLCALC Lab Results  Component Value Date   TSH 2.47 12/08/2018   TSH 0.96 10/15/2018    Therapeutic Level Labs: No results found for: LITHIUM No results found for: VALPROATE No components found for:   CBMZ  Current Medications: Current Outpatient Medications  Medication Sig Dispense Refill  . acetaminophen (TYLENOL) 500 MG tablet Take 1 tablet (500 mg total) by mouth every 6 (six) hours as needed for moderate pain. Also give 500 mg three times a day for 7  Days. 60 tablet 0  . albuterol (VENTOLIN HFA) 108 (90 Base) MCG/ACT inhaler Inhale 2 puffs into the lungs every 6 (six) hours as needed for wheezing or shortness of breath. 8 g 2  . ALPRAZolam (XANAX) 0.25 MG tablet Take 0.25 mg by mouth at bedtime as needed for anxiety.    . AMBULATORY NON FORMULARY MEDICATION Medication Name: incentive spirometry Use as directed 1 each 0  . buPROPion (WELLBUTRIN) 100 MG tablet Take 1 tablet (100  mg total) by mouth daily. In the morning 90 tablet 1  . CALCIUM CITRATE PO Take by mouth.    . cholestyramine (QUESTRAN) 4 g packet ADD 1 PACKET TO 8OZ OF FLUID AND DRINK 1-2 TIMES A DAY AS NEEDED LOOSE STOOLS 180 each 1  . Cyanocobalamin (VITAMIN B12) 500 MCG TABS Take by mouth.    . diphenoxylate-atropine (LOMOTIL) 2.5-0.025 MG tablet Take by mouth. Take 1 tablet by mouth 2 (two) times daily as needed for Diarrhea As need abdominal pain/diarrhea.    . doxycycline (VIBRAMYCIN) 100 MG capsule Take 1 capsule (100 mg total) by mouth 2 (two) times daily. 20 capsule 0  . erythromycin ophthalmic ointment Apply 0.5 inch ribbon to the eye four times daily for 5-7 days. (Patient not taking: Reported on 01/06/2019) 3.5 g 0  . fluticasone (FLONASE) 50 MCG/ACT nasal spray USE 2 SPRAYS NASALLY DAILY 48 g 2  . furosemide (LASIX) 40 MG tablet TAKE 1 TABLET DAILY 90 tablet 4  . hydrOXYzine (ATARAX/VISTARIL) 25 MG tablet Take 0.5 tablets (12.5 mg total) by mouth as directed. 2-3 times a week - Only for severe panic attacks 45 tablet 1  . levocetirizine (XYZAL) 5 MG tablet TAKE 1 TABLET AT BEDTIME EVERY NIGHT 90 tablet 3  . levothyroxine (SYNTHROID, LEVOTHROID) 50 MCG tablet TAKE 1 TABLET DAILY 90 tablet 4  . lidocaine (LIDODERM)  5 % Place 1 patch onto the skin daily. Remove & Discard patch within 12 hours or as directed by MD 30 patch 5  . loperamide (IMODIUM) 2 MG capsule Take 2 mg by mouth as needed for diarrhea or loose stools.    . magic mouthwash SOLN Swish and spit 5 ml twice daily until healed 240 mL 0  . magnesium gluconate (MAGONATE) 500 MG tablet Take 400 mg by mouth daily.     . meclizine (ANTIVERT) 12.5 MG tablet Take 12.5 mg by mouth every 4 (four) hours as needed for dizziness.    . Multiple Vitamin (MULTIVITAMIN) tablet Take 1 tablet by mouth daily.    Marland Kitchen nystatin cream (MYCOSTATIN) Apply 1 application topically 2 (two) times daily. 30 g 0  . OSCIMIN 0.125 MG tablet TAKE 1 TABLET TWICE A DAY 180 tablet 3  . pantoprazole (PROTONIX) 20 MG tablet Take 1 tablet (20 mg total) by mouth daily. 90 tablet 1  . Probiotic Product (PROBIOTIC COLON SUPPORT PO) Take 1 capsule by mouth daily.    . ranitidine (ZANTAC) 75 MG tablet Take 75 mg by mouth 1 day or 1 dose.    . simvastatin (ZOCOR) 20 MG tablet TAKE 1 TABLET DAILY 90 tablet 4  . spironolactone (ALDACTONE) 25 MG tablet Take 1 tablet (25 mg total) by mouth daily. 90 tablet 0  . sucralfate (CARAFATE) 1 g tablet May take before meals (Patient not taking: Reported on 01/06/2019) 21 tablet 0  . Suvorexant (BELSOMRA) 10 MG TABS Take 10 mg by mouth at bedtime. 90 tablet 1  . tamsulosin (FLOMAX) 0.4 MG CAPS capsule TAKE 1 CAPSULE DAILY 90 capsule 4  . tiotropium (SPIRIVA HANDIHALER) 18 MCG inhalation capsule Place 1 capsule (18 mcg total) into inhaler and inhale daily. 90 capsule 3  . triamcinolone cream (KENALOG) 0.1 % APP BID UNTIL CLEAR/FLAT  0  . venlafaxine XR (EFFEXOR-XR) 75 MG 24 hr capsule Take 1 capsule (75 mg total) by mouth daily with breakfast. 90 capsule 1  . Wheat Dextrin (BENEFIBER DRINK MIX PO) Take by mouth.     No current facility-administered medications  for this visit.     Musculoskeletal: Strength & Muscle Tone: UTA Gait & Station: Observed as  seated Patient leans: N/A  Psychiatric Specialty Exam: Review of Systems  Psychiatric/Behavioral: Positive for sleep disturbance (Improving).  All other systems reviewed and are negative.   There were no vitals taken for this visit.There is no height or weight on file to calculate BMI.  General Appearance: Casual  Eye Contact:  Fair  Speech:  Clear and Coherent  Volume:  Normal  Mood:  Euthymic  Affect:  Congruent  Thought Process:  Goal Directed and Descriptions of Associations: Intact  Orientation:  Full (Time, Place, and Person)  Thought Content: Logical   Suicidal Thoughts:  No  Homicidal Thoughts:  No  Memory:  Immediate;   Fair Recent;   Fair Remote;   Fair  Judgement:  Fair  Insight:  Fair  Psychomotor Activity:  Normal  Concentration:  Concentration: Fair and Attention Span: Fair  Recall:  [employer] of Knowledge: Fair  Language: Fair  Akathisia:  No  Handed:  Right  AIMS (if indicated): UTA  Assets:  Communication Skills Desire for Improvement Housing Social Support  ADL's:  Intact  Cognition: WNL  Sleep:  improving   Screenings: PHQ2-9     Procedure visit from 09/04/2015 in [facility] Procedure visit from 10/03/2014 in [facility] Office Visit from 09/21/2014 in [facility]  PHQ-2 Total Score  0  0  1       Assessment and Plan: Pamler is a 86 year old Caucasian female who has a history of depression, COPD, hypothyroidism, hypertension, renal function abnormalities was evaluated by by telemedicine today.  Patient is currently doing well on current medication regimen.  Plan as noted below.  Plan MDD-in remission Venlafaxine extended release 75 mg p.o. daily at reduced dosage Wellbutrin 100 mg p.o. daily  Skin picking disorder-improving We will continue to monitor.  Insomnia-improving Belsomra 10 mg p.o. nightly.   Patient to continue to work on sleep hygiene techniques  I have reviewed labs dated 02/11/2019-CBC-red blood cell counts-low at 3.11, hemoglobin low at 8.9, hematocrit low at 27.4-RDW-high at 15.2, iron total-low at 28, saturation low at 8, iron binding capacity-333-within normal limits, ferritin 33-stable. Patient will follow-up with her primary care provider for her abnormal labs.  Collateral information was obtained from nurse at assisted living facility[facility]  Follow-up in clinic in 3 months or sooner if needed.  I have spent atleast 20 minutes non face to face with patient today. More than 50 % of the time was spent for preparing to see the patient ( e.g., review of test, records ), obtaining and to review and separately obtained history , ordering medications and test ,psychoeducation and supportive psychotherapy and care coordination,as well as documenting clinical information in electronic health record,interpreting results of test and communication of results This note was generated in part or whole with voice recognition software. Voice recognition is usually quite accurate but there are transcription errors that can and very often do occur. I apologize for any typographical errors that were not detected and corrected.        Ursula Alert, MD 03/16/2019, 11:59 AM

## 2019-03-19 ENCOUNTER — Encounter: Payer: Self-pay | Admitting: Oncology

## 2019-03-19 ENCOUNTER — Other Ambulatory Visit: Payer: Self-pay

## 2019-03-19 ENCOUNTER — Inpatient Hospital Stay: Payer: Medicare Other

## 2019-03-19 ENCOUNTER — Inpatient Hospital Stay: Payer: Medicare Other | Attending: Oncology | Admitting: Oncology

## 2019-03-19 VITALS — BP 128/48 | HR 101 | Temp 97.2°F | Resp 18 | Wt 117.2 lb

## 2019-03-19 DIAGNOSIS — R5383 Other fatigue: Secondary | ICD-10-CM | POA: Diagnosis not present

## 2019-03-19 DIAGNOSIS — D509 Iron deficiency anemia, unspecified: Secondary | ICD-10-CM | POA: Insufficient documentation

## 2019-03-19 DIAGNOSIS — C44310 Basal cell carcinoma of skin of unspecified parts of face: Secondary | ICD-10-CM

## 2019-03-19 DIAGNOSIS — Z880 Allergy status to penicillin: Secondary | ICD-10-CM

## 2019-03-19 DIAGNOSIS — R531 Weakness: Secondary | ICD-10-CM | POA: Insufficient documentation

## 2019-03-19 DIAGNOSIS — R112 Nausea with vomiting, unspecified: Secondary | ICD-10-CM | POA: Diagnosis not present

## 2019-03-19 DIAGNOSIS — Z79899 Other long term (current) drug therapy: Secondary | ICD-10-CM | POA: Diagnosis not present

## 2019-03-19 DIAGNOSIS — E039 Hypothyroidism, unspecified: Secondary | ICD-10-CM | POA: Diagnosis not present

## 2019-03-19 DIAGNOSIS — Z886 Allergy status to analgesic agent status: Secondary | ICD-10-CM | POA: Diagnosis not present

## 2019-03-19 DIAGNOSIS — Z881 Allergy status to other antibiotic agents status: Secondary | ICD-10-CM | POA: Insufficient documentation

## 2019-03-19 DIAGNOSIS — Z882 Allergy status to sulfonamides status: Secondary | ICD-10-CM | POA: Insufficient documentation

## 2019-03-19 DIAGNOSIS — F329 Major depressive disorder, single episode, unspecified: Secondary | ICD-10-CM

## 2019-03-19 DIAGNOSIS — Z888 Allergy status to other drugs, medicaments and biological substances status: Secondary | ICD-10-CM | POA: Insufficient documentation

## 2019-03-19 DIAGNOSIS — J449 Chronic obstructive pulmonary disease, unspecified: Secondary | ICD-10-CM

## 2019-03-19 DIAGNOSIS — R319 Hematuria, unspecified: Secondary | ICD-10-CM | POA: Insufficient documentation

## 2019-03-19 DIAGNOSIS — Z8249 Family history of ischemic heart disease and other diseases of the circulatory system: Secondary | ICD-10-CM | POA: Insufficient documentation

## 2019-03-19 DIAGNOSIS — Z823 Family history of stroke: Secondary | ICD-10-CM | POA: Insufficient documentation

## 2019-03-19 LAB — DAT, POLYSPECIFIC AHG (ARMC ONLY)
DAT, IgG: POSITIVE
DAT, complement: NEGATIVE
Polyspecific AHG test: POSITIVE

## 2019-03-19 LAB — CBC
HCT: 32.9 % — ABNORMAL LOW (ref 36.0–46.0)
Hemoglobin: 10 g/dL — ABNORMAL LOW (ref 12.0–15.0)
MCH: 27.2 pg (ref 26.0–34.0)
MCHC: 30.4 g/dL (ref 30.0–36.0)
MCV: 89.6 fL (ref 80.0–100.0)
Platelets: 324 10*3/uL (ref 150–400)
RBC: 3.67 MIL/uL — ABNORMAL LOW (ref 3.87–5.11)
RDW: 15.9 % — ABNORMAL HIGH (ref 11.5–15.5)
WBC: 6.6 10*3/uL (ref 4.0–10.5)
nRBC: 0 % (ref 0.0–0.2)

## 2019-03-19 LAB — RETICULOCYTES
Immature Retic Fract: 12.2 % (ref 2.3–15.9)
RBC.: 3.62 MIL/uL — ABNORMAL LOW (ref 3.87–5.11)
Retic Count, Absolute: 29.7 10*3/uL (ref 19.0–186.0)
Retic Ct Pct: 0.8 % (ref 0.4–3.1)

## 2019-03-19 LAB — IRON AND TIBC
Iron: 47 ug/dL (ref 28–170)
Saturation Ratios: 11 % (ref 10.4–31.8)
TIBC: 438 ug/dL (ref 250–450)
UIBC: 391 ug/dL

## 2019-03-19 LAB — FOLATE: Folate: 11.3 ng/mL (ref >5.9)

## 2019-03-19 LAB — VITAMIN B12: Vitamin B-12: 581 pg/mL (ref 180–914)

## 2019-03-19 LAB — LACTATE DEHYDROGENASE: LDH: 175 U/L (ref 98–192)

## 2019-03-19 LAB — FERRITIN: Ferritin: 20 ng/mL (ref 11–307)

## 2019-03-19 NOTE — Progress Notes (Signed)
New patient in for anemia issues.

## 2019-03-20 LAB — HAPTOGLOBIN: Haptoglobin: 237 mg/dL (ref 41–333)

## 2019-03-20 LAB — ERYTHROPOIETIN: Erythropoietin: 38.5 m[IU]/mL — ABNORMAL HIGH (ref 2.6–18.5)

## 2019-03-25 ENCOUNTER — Telehealth: Payer: Self-pay | Admitting: Physician Assistant

## 2019-03-25 NOTE — Telephone Encounter (Signed)
[place] w/ [facility] was advised. Oley Balm states that she agrees with you but wanted you to know the wound is dry and red only. She states its not hot, an drainage or open.FYI

## 2019-03-25 NOTE — Telephone Encounter (Signed)
No evidence to support prophylactic antibiotic for superficial wound infections and I think an unnecessary antibiotic would do her more harm than good at this point. I'm happy to send in some antibiotic cream to place on the wound but the most important aspect would be to keep the wound clean. What pharmacy do they want it sent to?

## 2019-04-08 NOTE — Progress Notes (Signed)
[place]  [phone] [phone]) [phone]  ID: [name] OB: 1926-08-19  MR#: [id number]  [id number]  Patient Care Team: Paulene Floor as PCP - General (Physician Assistant)  CHIEF COMPLAINT: Iron deficiency anemia.  INTERVAL HISTORY: Patient returns to clinic today for further evaluation, discussion of her laboratory work, and initiation of IV Feraheme.  She continues to have chronic weakness and fatigue.  She has no neurologic complaints.  She denies any recent fevers or illnesses.  She has a good appetite and denies weight loss.  She has no chest pain, shortness of breath, cough, or hemoptysis.  He denies any nausea, vomiting, constipation, or diarrhea.  She has no melena or hematochezia.  She has no urinary complaints.  Patient offers no specific complaints today.  REVIEW OF SYSTEMS:   Review of Systems  Constitutional: Positive for malaise/fatigue. Negative for fever and weight loss.  Respiratory: Negative.  Negative for cough, hemoptysis and shortness of breath.   Cardiovascular: Negative.  Negative for chest pain and leg swelling.  Gastrointestinal: Negative.  Negative for abdominal pain, blood in stool and melena.  Genitourinary: Positive for hematuria.  Musculoskeletal: Negative.  Negative for back pain.  Skin: Negative.  Negative for rash.  Neurological: Positive for weakness. Negative for focal weakness and headaches.  Psychiatric/Behavioral: Negative.  The patient is not nervous/anxious.     As per HPI. Otherwise, a complete review of systems is negative.  PAST MEDICAL HISTORY: Past Medical History:  Diagnosis Date  . Cataract   . COPD (chronic obstructive pulmonary disease) ([place])   . Depression   . Difficulty swallowing   . Frequent headaches   . Hearing loss   . Hypertension   . Hypothyroidism   . Reflux     PAST SURGICAL HISTORY: Past Surgical History:  Procedure Laterality Date  . ABDOMINAL  HYSTERECTOMY    . APPENDECTOMY    . LUMBAR LAMINECTOMY    . PARATHYROIDECTOMY    . TOTAL HIP ARTHROPLASTY     x 4    FAMILY HISTORY: Family History  Problem Relation Age of Onset  . Stroke Mother   . Hypertension Mother   . Heart disease Father   . Hypertension Father     ADVANCED DIRECTIVES (Y/N):  N  HEALTH MAINTENANCE: Social History   Tobacco Use  . Smoking status: Never Smoker  . Smokeless tobacco: Never Used  . Tobacco comment: quit 1954  Substance Use Topics  . Alcohol use: No  . Drug use: No     Colonoscopy:  PAP:  Bone density:  Lipid panel:  Allergies  Allergen Reactions  . Sulfa Antibiotics Rash and Itching    Other reaction(s): Diarrhea and vomiting (finding)  . Erythromycin Nausea And Vomiting    Other reaction(s): Diarrhea and vomiting (finding)  . Aspirin Other (See Comments), Tinitus and Nausea And Vomiting    Ringing of the ears, caused hearing loss both ears Ringing in ears  . Contrast Media [Iodinated Diagnostic Agents] Rash and Hives  . Latex Rash and Itching  . Penicillin G Rash  . Penicillins Rash    Other reaction(s): UNKNOWN  . Tape Rash    Other reaction(s): UNKNOWN Adhesive Other reaction(s): UNKNOWN Adhesive    Current Outpatient Medications  Medication Sig Dispense Refill  . acetaminophen (TYLENOL) 500 MG tablet Take 1 tablet (500 mg total) by mouth every 6 (six) hours as needed for moderate pain. Also give 500 mg three times a day for 7  Days.  60 tablet 0  . albuterol (VENTOLIN HFA) 108 (90 Base) MCG/ACT inhaler Inhale 2 puffs into the lungs every 6 (six) hours as needed for wheezing or shortness of breath. 8 g 2  . ALPRAZolam (XANAX) 0.25 MG tablet Take 0.25 mg by mouth at bedtime as needed for anxiety.    . AMBULATORY NON FORMULARY MEDICATION Medication Name: incentive spirometry Use as directed 1 each 0  . bismuth subsalicylate (PEPTO BISMOL) 262 MG/15ML suspension Take 30 mLs by mouth as needed.    Marland Kitchen buPROPion  (WELLBUTRIN) 75 MG tablet Take 75 mg by mouth daily.    Marland Kitchen CALCIUM CITRATE PO Take 1 tablet by mouth in the morning and at bedtime.     . carbamide peroxide (DEBROX) 6.5 % OTIC solution Place 5 drops into both ears as needed.    . celecoxib (CELEBREX) 200 MG capsule Take 200 mg by mouth daily.    . cetirizine (ZYRTEC ALLERGY) 10 MG tablet Take 10 mg by mouth as needed for allergies.    . cholestyramine (QUESTRAN) 4 g packet ADD 1 PACKET TO 8OZ OF FLUID AND DRINK 1-2 TIMES A DAY AS NEEDED LOOSE STOOLS 180 each 1  . Cyanocobalamin (VITAMIN B12) 500 MCG TABS Take by mouth.    . diphenoxylate-atropine (LOMOTIL) 2.5-0.025 MG tablet Take by mouth. Take 1 tablet by mouth 2 (two) times daily as needed for Diarrhea As need abdominal pain/diarrhea.    . eszopiclone (LUNESTA) 1 MG TABS tablet Take 1 mg by mouth at bedtime as needed for sleep. Take immediately before bedtime    . fluticasone (FLONASE) 50 MCG/ACT nasal spray USE 2 SPRAYS NASALLY DAILY 48 g 2  . furosemide (LASIX) 40 MG tablet TAKE 1 TABLET DAILY 90 tablet 4  . guaifenesin (HUMIBID E) 400 MG TABS tablet Take 400 mg by mouth in the morning and at bedtime.    . hydrocortisone 1 % lotion Apply 1 application topically as needed for itching.    . hydrOXYzine (ATARAX/VISTARIL) 25 MG tablet Take 0.5 tablets (12.5 mg total) by mouth as directed. 2-3 times a week - Only for severe panic attacks 45 tablet 1  . levocetirizine (XYZAL) 5 MG tablet TAKE 1 TABLET AT BEDTIME EVERY NIGHT 90 tablet 3  . levothyroxine (SYNTHROID, LEVOTHROID) 50 MCG tablet TAKE 1 TABLET DAILY 90 tablet 4  . lidocaine (LIDODERM) 5 % Place 1 patch onto the skin as needed. Remove & Discard patch within 12 hours or as directed by MD    . loperamide (IMODIUM) 2 MG capsule Take 2 mg by mouth as needed for diarrhea or loose stools.    . magnesium hydroxide (MILK OF MAGNESIA) 400 MG/5ML suspension Take by mouth daily as needed for mild constipation. 2 tbsp    . magnesium oxide (MAG-OX) 400  MG tablet Take 400 mg by mouth daily.    . meclizine (ANTIVERT) 12.5 MG tablet Take 12.5 mg by mouth every 4 (four) hours as needed for dizziness.    . melatonin 5 MG TABS Take 5 mg by mouth at bedtime.    . Mouthwashes (MOUTHWASH/GARGLE MT) Use as directed in the mouth or throat. 1 tsp by mouth as needed for sore mouth and tongue    . Multiple Vitamin (MULTIVITAMIN) tablet Take 1 tablet by mouth daily.    Marland Kitchen nystatin (MYCOSTATIN/NYSTOP) powder Apply 1 application topically 2 (two) times daily as needed.    . ondansetron (ZOFRAN) 4 MG tablet Take 4 mg by mouth as needed for nausea or  vomiting.    . OXYGEN Inhale 2 L into the lungs continuous as needed.    . pantoprazole (PROTONIX) 20 MG tablet Take 1 tablet (20 mg total) by mouth daily. 90 tablet 1  . Phenylephrine-DM-GG-APAP (DELSYM COUGH/COLD DAYTIME) 5-10-200-325 MG/10ML LIQD Take 5 mLs by mouth as needed.    . Probiotic Product (PROBIOTIC COLON SUPPORT PO) Take 1 capsule by mouth daily.    . simvastatin (ZOCOR) 20 MG tablet TAKE 1 TABLET DAILY 90 tablet 4  . spironolactone (ALDACTONE) 25 MG tablet Take 1 tablet (25 mg total) by mouth daily. 90 tablet 0  . tamsulosin (FLOMAX) 0.4 MG CAPS capsule TAKE 1 CAPSULE DAILY 90 capsule 4  . tiotropium (SPIRIVA HANDIHALER) 18 MCG inhalation capsule Place 1 capsule (18 mcg total) into inhaler and inhale daily. 90 capsule 3  . venlafaxine XR (EFFEXOR-XR) 75 MG 24 hr capsule Take 1 capsule (75 mg total) by mouth daily with breakfast. 90 capsule 1  . Suvorexant (BELSOMRA) 10 MG TABS Take 10 mg by mouth at bedtime. (Patient not taking: Reported on 03/19/2019) 90 tablet 1   No current facility-administered medications for this visit.    OBJECTIVE: Vitals:   04/13/19 1325  BP: (!) 120/54  Pulse: (!) 105  Temp: (!) 97.5 F (36.4 C)  SpO2: 100%     Body mass index is 22.65 kg/m.    ECOG FS:1 - Symptomatic but completely ambulatory  General: Well-developed, well-nourished, no acute distress. Eyes: Pink  conjunctiva, anicteric sclera. HEENT: Normocephalic, moist mucous membranes. Lungs: No audible wheezing or coughing. Heart: Regular rate and rhythm. Abdomen: Soft, nontender, no obvious distention. Musculoskeletal: No edema, cyanosis, or clubbing. Neuro: Alert, answering all questions appropriately. Cranial nerves grossly intact. Skin: No rashes or petechiae noted. Psych: Normal affect.  LAB RESULTS:  Lab Results  Component Value Date   NA 142 12/08/2018   K 4.3 12/08/2018   CL 107 12/08/2018   CO2 26 11/24/2014   GLUCOSE 90 11/24/2014   BUN 36 (A) 12/08/2018   CREATININE 1.5 (A) 12/08/2018   CALCIUM 8.6 (A) 12/08/2018   PROT 6.7 11/24/2014   ALBUMIN 3.7 12/08/2018   AST 15 12/08/2018   ALT 10 12/08/2018   ALKPHOS 68 12/08/2018   BILITOT 0.5 11/24/2014   GFRNONAA 31 12/08/2018   GFRAA 36 12/08/2018    Lab Results  Component Value Date   WBC 6.6 03/19/2019   NEUTROABS 3,023 02/11/2019   HGB 10.0 (L) 03/19/2019   HCT 32.9 (L) 03/19/2019   MCV 89.6 03/19/2019   PLT 324 03/19/2019    Lab Results  Component Value Date   IRON 47 03/19/2019   TIBC 438 03/19/2019   IRONPCTSAT 11 03/19/2019   Lab Results  Component Value Date   FERRITIN 20 03/19/2019     STUDIES: DG Chest 2 View  Result Date: 04/11/2019 CLINICAL DATA:  Left-sided chest pain following fall, initial encounter EXAM: CHEST - 2 VIEW COMPARISON:  07/27/2014 FINDINGS: Cardiac shadow is within normal limits. Large hiatal hernia is noted. Aortic calcifications are seen. Lungs are well aerated bilaterally. No focal infiltrate or sizable effusion is seen. No acute rib abnormality is seen on this limited exam. Degenerative changes of the thoracic spine are noted with increased kyphosis. Changes of prior vertebral augmentation in the lumbar spine are noted. IMPRESSION: Chronic changes without acute abnormality. No definitive rib fractures are seen. Electronically Signed   By: Inez Catalina M.D.   On: 04/11/2019 00:51     ASSESSMENT: Iron deficiency  anemia.  PLAN:    1. Iron deficiency anemia: Although patient's hemoglobin is slightly improved to 10.0, her iron panel is in the low/normal range and she will benefit from 510 mg IV Feraheme.  Her reticulocyte count is inappropriately normal for this level of anemia.  All of her other laboratory work is either negative or within normal limits.  [employer] myeloid panel revealed a variant of unknown clinical significance.  Proceed with treatment today.  Return to clinic in 3 months with repeat laboratory work and further evaluation.   2.  Hematuria: Patient reports this is chronic and unchanged for decades and no etiology has ever been determined.  I spent a total of 30 minutes reviewing chart data, face-to-face evaluation with the patient, counseling and coordination of care as detailed above.   Patient expressed understanding and was in agreement with this plan. She also understands that She can call clinic at any time with any questions, concerns, or complaints.    Lloyd Huger, MD   04/14/2019 12:20 PM

## 2019-04-09 LAB — INTELLIGEN MYELOID: IntelliGEN Myeloid PDF: 0

## 2019-04-10 DIAGNOSIS — Z79899 Other long term (current) drug therapy: Secondary | ICD-10-CM | POA: Diagnosis not present

## 2019-04-10 DIAGNOSIS — N183 Chronic kidney disease, stage 3 unspecified: Secondary | ICD-10-CM | POA: Insufficient documentation

## 2019-04-10 DIAGNOSIS — Y999 Unspecified external cause status: Secondary | ICD-10-CM | POA: Insufficient documentation

## 2019-04-10 DIAGNOSIS — Y92009 Unspecified place in unspecified non-institutional (private) residence as the place of occurrence of the external cause: Secondary | ICD-10-CM | POA: Diagnosis not present

## 2019-04-10 DIAGNOSIS — Z91040 Latex allergy status: Secondary | ICD-10-CM | POA: Diagnosis not present

## 2019-04-10 DIAGNOSIS — W01198A Fall on same level from slipping, tripping and stumbling with subsequent striking against other object, initial encounter: Secondary | ICD-10-CM | POA: Insufficient documentation

## 2019-04-10 DIAGNOSIS — I129 Hypertensive chronic kidney disease with stage 1 through stage 4 chronic kidney disease, or unspecified chronic kidney disease: Secondary | ICD-10-CM | POA: Diagnosis not present

## 2019-04-10 DIAGNOSIS — Z96649 Presence of unspecified artificial hip joint: Secondary | ICD-10-CM | POA: Insufficient documentation

## 2019-04-10 DIAGNOSIS — E039 Hypothyroidism, unspecified: Secondary | ICD-10-CM | POA: Insufficient documentation

## 2019-04-10 DIAGNOSIS — S20212A Contusion of left front wall of thorax, initial encounter: Secondary | ICD-10-CM | POA: Diagnosis not present

## 2019-04-10 DIAGNOSIS — Z85828 Personal history of other malignant neoplasm of skin: Secondary | ICD-10-CM | POA: Insufficient documentation

## 2019-04-10 DIAGNOSIS — S2232XA Fracture of one rib, left side, initial encounter for closed fracture: Secondary | ICD-10-CM | POA: Insufficient documentation

## 2019-04-10 DIAGNOSIS — J449 Chronic obstructive pulmonary disease, unspecified: Secondary | ICD-10-CM | POA: Insufficient documentation

## 2019-04-10 DIAGNOSIS — Y9389 Activity, other specified: Secondary | ICD-10-CM | POA: Diagnosis not present

## 2019-04-10 DIAGNOSIS — S299XXA Unspecified injury of thorax, initial encounter: Secondary | ICD-10-CM | POA: Diagnosis present

## 2019-04-10 NOTE — ED Notes (Signed)
First rn note: pt assisted out of car into wheelchair. Pt states fell tonight injuring left ribs. resps unlabored.

## 2019-04-11 ENCOUNTER — Emergency Department
Admission: EM | Admit: 2019-04-11 | Discharge: 2019-04-11 | Disposition: A | Discharge status: Home / Self Care | Payer: Medicare Other | Attending: Emergency Medicine | Admitting: Emergency Medicine

## 2019-04-11 ENCOUNTER — Other Ambulatory Visit: Payer: Self-pay

## 2019-04-11 ENCOUNTER — Emergency Department: Payer: Medicare Other

## 2019-04-11 ENCOUNTER — Encounter: Payer: Self-pay | Admitting: Emergency Medicine

## 2019-04-11 DIAGNOSIS — S2232XA Fracture of one rib, left side, initial encounter for closed fracture: Secondary | ICD-10-CM

## 2019-04-11 DIAGNOSIS — S20212A Contusion of left front wall of thorax, initial encounter: Secondary | ICD-10-CM

## 2019-04-11 NOTE — ED Notes (Signed)
Pt provided with incentive spirometer and verbalizes understanding of instructions.

## 2019-04-11 NOTE — ED Provider Notes (Signed)
[facility] Emergency Department Provider Note  ____________________________________________  Time seen: Approximately 4:11 AM  I have reviewed the triage vital signs and the nursing notes.   HISTORY  Chief Complaint Fall and Chest Pain    HPI Sandra Brown is a 86 y.o. female with a history of COPD, hypertension, CKD who was in her usual state of health this evening when she got out of her chair at home, who was using her Rollator, and when she made a turn she lost her balance and fell onto the Rollator.  This caused sudden onset of left chest wall pain which has been constant, hurts with deep breathing, nonradiating, moderate intensity.  Denies shortness of breath or head injury.  No loss of consciousness.      Past Medical History:  Diagnosis Date  . Cataract   . COPD (chronic obstructive pulmonary disease) ([place])   . Depression   . Difficulty swallowing   . Frequent headaches   . Hearing loss   . Hypertension   . Hypothyroidism   . Reflux      Patient Active Problem List   Diagnosis Date Noted  . Panic attacks 03/16/2019  . MDD (major depressive disorder), recurrent episode, mild ([place]) 09/30/2018  . Insomnia due to mental condition 09/30/2018  . Skin-picking disorder 09/30/2018  . Lymphedema 01/27/2018  . Chronic venous insufficiency 01/27/2018  . Cellulitis of leg, left 12/23/2017  . Swelling of limb 12/23/2017  . CKD (chronic kidney disease) stage 3, GFR 30-59 ml/min 10/02/2015  . Hypothyroidism 10/02/2015  . IBS (irritable bowel syndrome) 12/15/2014  . Allergic rhinitis 11/14/2014  . Facet syndrome, lumbar 09/21/2014  . Sacroiliac joint dysfunction 09/21/2014  . Hyperlipemia 09/01/2014  . Depression 08/29/2014  . Iron deficiency anemia 07/14/2014  . GERD (gastroesophageal reflux disease) 07/14/2014  . COPD (chronic obstructive pulmonary disease) ([place]) 06/24/2014  . Incomplete bladder emptying 12/09/2012  . Urge incontinence  12/09/2012  . Obstruction of urinary tract 12/09/2012  . Basal cell carcinoma of face 08/20/2010     Past Surgical History:  Procedure Laterality Date  . ABDOMINAL HYSTERECTOMY    . APPENDECTOMY    . LUMBAR LAMINECTOMY    . PARATHYROIDECTOMY    . TOTAL HIP ARTHROPLASTY     x 4     Prior to Admission medications   Medication Sig Start Date End Date Taking? Authorizing Provider  acetaminophen (TYLENOL) 500 MG tablet Take 1 tablet (500 mg total) by mouth every 6 (six) hours as needed for moderate pain. Also give 500 mg three times a day for 7  Days. 11/23/14   Margarita Rana, MD  albuterol (VENTOLIN HFA) 108 (90 Base) MCG/ACT inhaler Inhale 2 puffs into the lungs every 6 (six) hours as needed for wheezing or shortness of breath. 07/30/18   Trinna Post, PA-C  ALPRAZolam [name]) 0.25 MG tablet Take 0.25 mg by mouth at bedtime as needed for anxiety.    [provider]  AMBULATORY NON FORMULARY MEDICATION Medication Name: incentive spirometry Use as directed 03/03/15   Vilinda Boehringer, MD  buPROPion (WELLBUTRIN) 100 MG tablet Take 1 tablet (100 mg total) by mouth daily. In the morning 03/16/19   Ursula Alert, MD  CALCIUM CITRATE PO Take by mouth.    [provider]  cholestyramine (QUESTRAN) 4 g packet ADD 1 PACKET TO 8OZ OF FLUID AND DRINK 1-2 TIMES A DAY AS NEEDED LOOSE STOOLS 01/13/18   Carmon Ginsberg, PA  Cyanocobalamin (VITAMIN B12) 500 MCG TABS Take  by mouth.    [provider]  diphenoxylate-atropine (LOMOTIL) 2.5-0.025 MG tablet Take by mouth. Take 1 tablet by mouth 2 (two) times daily as needed for Diarrhea As need abdominal pain/diarrhea. 09/26/17   [provider]  fluticasone (FLONASE) 50 MCG/ACT nasal spray USE 2 SPRAYS NASALLY DAILY 12/29/18   Trinna Post, PA-C  furosemide (LASIX) 40 MG tablet TAKE 1 TABLET DAILY 10/29/17   Carmon Ginsberg, PA  hydrOXYzine (ATARAX/VISTARIL) 25 MG tablet Take 0.5 tablets (12.5 mg total) by mouth as  directed. 2-3 times a week - Only for severe panic attacks 03/16/19   Ursula Alert, MD  levocetirizine (XYZAL) 5 MG tablet TAKE 1 TABLET AT BEDTIME EVERY NIGHT 12/29/18   Trinna Post, PA-C  levothyroxine (SYNTHROID, LEVOTHROID) 50 MCG tablet TAKE 1 TABLET DAILY 10/29/17   Carmon Ginsberg, PA  loperamide (IMODIUM) 2 MG capsule Take 2 mg by mouth as needed for diarrhea or loose stools.    [provider]  magnesium gluconate (MAGONATE) 500 MG tablet Take 400 mg by mouth daily.     [provider]  meclizine (ANTIVERT) 12.5 MG tablet Take 12.5 mg by mouth every 4 (four) hours as needed for dizziness.    [provider]  Multiple Vitamin (MULTIVITAMIN) tablet Take 1 tablet by mouth daily.    [provider]  pantoprazole (PROTONIX) 20 MG tablet Take 1 tablet (20 mg total) by mouth daily. 04/23/18   Virginia Crews, MD  Probiotic Product (PROBIOTIC COLON SUPPORT PO) Take 1 capsule by mouth daily.    [provider]  simvastatin (ZOCOR) 20 MG tablet TAKE 1 TABLET DAILY 10/29/17   Carmon Ginsberg, PA  spironolactone (ALDACTONE) 25 MG tablet Take 1 tablet (25 mg total) by mouth daily. 03/02/19   Trinna Post, PA-C  Suvorexant (BELSOMRA) 10 MG TABS Take 10 mg by mouth at bedtime. Patient not taking: Reported on 03/19/2019 03/16/19   Ursula Alert, MD  tamsulosin (FLOMAX) 0.4 MG CAPS capsule TAKE 1 CAPSULE DAILY 10/27/17   Carmon Ginsberg, PA  tiotropium (SPIRIVA HANDIHALER) 18 MCG inhalation capsule Place 1 capsule (18 mcg total) into inhaler and inhale daily. 12/29/18   Trinna Post, PA-C  venlafaxine XR (EFFEXOR-XR) 75 MG 24 hr capsule Take 1 capsule (75 mg total) by mouth daily with breakfast. 03/16/19   Ursula Alert, MD     Allergies Sulfa antibiotics, Erythromycin, Aspirin, Contrast media [iodinated diagnostic agents], Latex, Penicillin g, Penicillins, and Tape   Family History  Problem Relation Age of Onset  . Stroke Mother   .  Hypertension Mother   . Heart disease Father   . Hypertension Father     Social History Social History   Tobacco Use  . Smoking status: Never Smoker  . Smokeless tobacco: Never Used  . Tobacco comment: quit 1954  Substance Use Topics  . Alcohol use: No  . Drug use: No    Review of Systems  Constitutional:   No fever or chills.  ENT:   No sore throat. No rhinorrhea. Cardiovascular:   No chest pain or syncope. Respiratory:   No dyspnea or cough. Gastrointestinal:   Negative for abdominal pain, vomiting and diarrhea.  Musculoskeletal: Left chest wall pain as above All other systems reviewed and are negative except as documented above in ROS and HPI.  ____________________________________________   PHYSICAL EXAM:  VITAL SIGNS: ED Triage Vitals  Enc Vitals Group     BP 04/11/19 0019 (!) 116/52     Pulse  Rate 04/11/19 0019 94     Resp 04/11/19 0019 18     Temp 04/11/19 0019 98.6 F (37 C)     Temp Source 04/11/19 0019 Oral     SpO2 04/11/19 0019 99 %     Weight 04/11/19 0020 117 lb (53.1 kg)     Height 04/11/19 0020 5' (1.524 m)     Head Circumference --      Peak Flow --      Pain Score 04/11/19 0020 7     Pain Loc --      Pain Edu? --      Excl. in [place]? --     Vital signs reviewed, nursing assessments reviewed.   Constitutional:   Alert and oriented. Non-toxic appearance. Eyes:   Conjunctivae are normal. EOMI.  ENT      Head:   Normocephalic and atraumatic.      Nose:   Wearing a mask.      Mouth/Throat:   Wearing a mask.      Neck:   No meningismus. Full ROM.  Cardiovascular:   RRR. Symmetric bilateral radial and DP pulses.  No murmurs. Cap refill less than 2 seconds. Respiratory:   Normal respiratory effort without tachypnea/retractions. Breath sounds are clear and equal bilaterally. No wheezes/rales/rhonchi. Gastrointestinal:   Soft and nontender. Non distended. There is no CVA tenderness.  No rebound, rigidity, or guarding. Musculoskeletal:   Normal  range of motion in all extremities. No joint effusions.  No lower extremity tenderness.  No edema. Left chest wall pain is reproduced with palpation over the left inferolateral chest at the anterior axillary line where there is a 4 cm linear bruise.  There is no rib displacement or crepitus or bony point tenderness, but bucket handle stress does recreate the pain as well.  Neurologic:   Normal speech and language.  Motor grossly intact. No acute focal neurologic deficits are appreciated.  Skin:    Skin is warm, dry and intact.  Bruising over left inferolateral chest as noted above.  No petechiae, purpura, or bullae.  ____________________________________________    LABS (pertinent positives/negatives) (all labs ordered are listed, but only abnormal results are displayed) Labs Reviewed - No data to display ____________________________________________   EKG    ____________________________________________    RADIOLOGY  DG Chest 2 View  Result Date: 04/11/2019 CLINICAL DATA:  Left-sided chest pain following fall, initial encounter EXAM: CHEST - 2 VIEW COMPARISON:  07/27/2014 FINDINGS: Cardiac shadow is within normal limits. Large hiatal hernia is noted. Aortic calcifications are seen. Lungs are well aerated bilaterally. No focal infiltrate or sizable effusion is seen. No acute rib abnormality is seen on this limited exam. Degenerative changes of the thoracic spine are noted with increased kyphosis. Changes of prior vertebral augmentation in the lumbar spine are noted. IMPRESSION: Chronic changes without acute abnormality. No definitive rib fractures are seen. Electronically Signed   By: Inez Catalina M.D.   On: 04/11/2019 00:51    ____________________________________________   PROCEDURES Procedures  ____________________________________________    CLINICAL IMPRESSION / ASSESSMENT AND PLAN / ED COURSE  Medications ordered in the ED: Medications - No data to display  Pertinent  labs & imaging results that were available during my care of the patient were reviewed by me and considered in my medical decision making (see chart for details).  Sandra Brown was evaluated in Emergency Department on 04/11/2019 for the symptoms described in the history of present illness. She was evaluated in the context  of the global COVID-19 pandemic, which necessitated consideration that the patient might be at risk for infection with the SARS-CoV-2 virus that causes COVID-19. Institutional protocols and algorithms that pertain to the evaluation of patients at risk for COVID-19 are in a state of rapid change based on information released by regulatory bodies including the CDC and federal and state organizations. These policies and algorithms were followed during the patient's care in the ED.   Patient presents after mechanical fall with blunt chest trauma.  Physical exam reveals a chest wall contusion with some bruising.  Also there is clinically apparent rib fracture although it is nondisplaced, not angulated on exam or chest x-ray and there is no evidence of pleural effusion/hemothorax or pneumothorax.  Stable for discharge back to [facility].  Recommend incentive spirometer which she has used in the past, more careful walking and following up with primary care.      ____________________________________________   FINAL CLINICAL IMPRESSION(S) / ED DIAGNOSES    Final diagnoses:  Closed fracture of one rib of left side, initial encounter  Chest wall contusion, left, initial encounter     ED Discharge Orders    None      Portions of this note were generated with dragon dictation software. Dictation errors may occur despite best attempts at proofreading.   Carrie Mew, MD 04/11/19 [phone]

## 2019-04-11 NOTE — Discharge Instructions (Addendum)
Your chest xray is unremarkable, but your physical exam shows you likely have a small, non-displaced fracture in one rib of the left lower chest.  Use the incentive spirometer two times a day for the next 2 weeks.  Each time, take 10 deep breaths of at least 591mL of air if possible.  You should follow up with your doctor or return to the ER if you have new or worsened symptoms including shortness of breath, fever, or worsening pain.

## 2019-04-11 NOTE — ED Triage Notes (Addendum)
Patient states that she tripped and fell onto her walker about 21:00 tonight. Patient with complaint of left rib pain. Patient states that the pain is worse with movement and when breathing deep.

## 2019-04-13 ENCOUNTER — Inpatient Hospital Stay: Payer: Medicare Other

## 2019-04-13 ENCOUNTER — Encounter: Payer: Self-pay | Admitting: Oncology

## 2019-04-13 ENCOUNTER — Other Ambulatory Visit: Payer: Self-pay

## 2019-04-13 ENCOUNTER — Inpatient Hospital Stay: Payer: Medicare Other | Attending: Oncology | Admitting: Oncology

## 2019-04-13 VITALS — BP 120/54 | HR 105 | Temp 97.5°F | Wt 116.0 lb

## 2019-04-13 VITALS — BP 114/57 | HR 90

## 2019-04-13 DIAGNOSIS — Z880 Allergy status to penicillin: Secondary | ICD-10-CM | POA: Diagnosis not present

## 2019-04-13 DIAGNOSIS — R319 Hematuria, unspecified: Secondary | ICD-10-CM | POA: Insufficient documentation

## 2019-04-13 DIAGNOSIS — Z8249 Family history of ischemic heart disease and other diseases of the circulatory system: Secondary | ICD-10-CM | POA: Insufficient documentation

## 2019-04-13 DIAGNOSIS — Z886 Allergy status to analgesic agent status: Secondary | ICD-10-CM | POA: Diagnosis not present

## 2019-04-13 DIAGNOSIS — Z823 Family history of stroke: Secondary | ICD-10-CM | POA: Diagnosis not present

## 2019-04-13 DIAGNOSIS — D509 Iron deficiency anemia, unspecified: Secondary | ICD-10-CM | POA: Diagnosis present

## 2019-04-13 DIAGNOSIS — Z888 Allergy status to other drugs, medicaments and biological substances status: Secondary | ICD-10-CM | POA: Diagnosis not present

## 2019-04-13 DIAGNOSIS — R531 Weakness: Secondary | ICD-10-CM | POA: Diagnosis not present

## 2019-04-13 DIAGNOSIS — Z79899 Other long term (current) drug therapy: Secondary | ICD-10-CM | POA: Insufficient documentation

## 2019-04-13 DIAGNOSIS — R5382 Chronic fatigue, unspecified: Secondary | ICD-10-CM | POA: Insufficient documentation

## 2019-04-13 DIAGNOSIS — Z882 Allergy status to sulfonamides status: Secondary | ICD-10-CM | POA: Diagnosis not present

## 2019-04-13 DIAGNOSIS — Z881 Allergy status to other antibiotic agents status: Secondary | ICD-10-CM | POA: Insufficient documentation

## 2019-04-13 MED ORDER — SODIUM CHLORIDE 0.9 % IV SOLN
Freq: Once | INTRAVENOUS | Status: AC
  Filled 2019-04-13: qty 250

## 2019-04-13 MED ORDER — SODIUM CHLORIDE 0.9 % IV SOLN
510.0000 mg | Freq: Once | INTRAVENOUS | Status: AC
  Administered 2019-04-13: 510 mg via INTRAVENOUS
  Filled 2019-04-13: qty 17

## 2019-04-29 ENCOUNTER — Telehealth: Payer: Self-pay | Admitting: Physician Assistant

## 2019-04-29 NOTE — Telephone Encounter (Signed)
Two DNR forms signed at request of [facility]. Will mail to address provided.

## 2019-06-09 ENCOUNTER — Other Ambulatory Visit: Payer: Self-pay

## 2019-06-09 ENCOUNTER — Telehealth (INDEPENDENT_AMBULATORY_CARE_PROVIDER_SITE_OTHER): Payer: Medicare Other | Admitting: Psychiatry

## 2019-06-09 ENCOUNTER — Encounter: Payer: Self-pay | Admitting: Psychiatry

## 2019-06-09 DIAGNOSIS — F3342 Major depressive disorder, recurrent, in full remission: Secondary | ICD-10-CM

## 2019-06-09 DIAGNOSIS — F424 Excoriation (skin-picking) disorder: Secondary | ICD-10-CM | POA: Diagnosis not present

## 2019-06-09 DIAGNOSIS — F5105 Insomnia due to other mental disorder: Secondary | ICD-10-CM | POA: Diagnosis not present

## 2019-06-09 MED ORDER — BUPROPION HCL 100 MG PO TABS: 100.0000 mg | ORAL_TABLET | Freq: Every morning | ORAL | 2 refills | Status: DC

## 2019-06-09 MED ORDER — BELSOMRA 10 MG PO TABS: 10.0000 mg | ORAL_TABLET | Freq: Every day | ORAL | 1 refills | Status: DC

## 2019-06-09 NOTE — Progress Notes (Signed)
Provider Location : [facility] Patient Location : [facility]F  Virtual Visit via Video Note  I connected with Sandra Brown on 06/09/19 at 11:00 AM EDT by a video enabled telemedicine application and verified that I am speaking with the correct person using two identifiers.   I discussed the limitations of evaluation and management by telemedicine and the availability of in person appointments. The patient expressed understanding and agreed to proceed.   I discussed the assessment and treatment plan with the patient. The patient was provided an opportunity to ask questions and all were answered. The patient agreed with the plan and demonstrated an understanding of the instructions.   The patient was advised to call back or seek an in-person evaluation if the symptoms worsen or if the condition fails to improve as anticipated.  [facility]Mullen MD OP Progress Note  06/09/2019 11:31 AM VINCENTIA DAVERSA  MRN:  [id number]  Chief Complaint:  Chief Complaint    Follow-up     HPI: Sandra Brown is a 86 year old Caucasian female, lives at [facility] assisted living facility, has a history of MDD, insomnia, skin picking disorder, COPD, hypothyroidism, hypertension, cognitive disorder was evaluated by telemedicine today.  Staff at the ALF-nurse Audrey-provided collateral information.  Per Lorie Phenix, patient is currently doing well.  She is compliant on medications.  She has been having falls however she is currently under the care of her providers and it is getting better.  Patient today reports mood symptoms are stable.  She denies any depression or anxiety.  She denies any suicidality.  She reports sleep is good.  She is compliant on medications.  She denies side effects.  She does report skin picking on and off.  She reports she is interested in talking to the therapist.  She reports due to the pandemic she could not do that.  She reports she is happy that her daughter Sandra Brown is visiting from [place].  Patient  denies any other concerns today.  Visit Diagnosis:    ICD-10-CM   1. MDD (major depressive disorder), recurrent, in full remission ([place])  F33.42 Suvorexant (BELSOMRA) 10 MG TABS    buPROPion (WELLBUTRIN) 100 MG tablet  2. Insomnia due to mental condition  F51.05   3. Skin-picking disorder  F42.4     Past Psychiatric History: I have reviewed past psychiatric history from my progress note on 03/21/2017  Past Medical History:  Past Medical History:  Diagnosis Date  . Cataract   . COPD (chronic obstructive pulmonary disease) ([place])   . Depression   . Difficulty swallowing   . Frequent headaches   . Hearing loss   . Hypertension   . Hypothyroidism   . Reflux     Past Surgical History:  Procedure Laterality Date  . ABDOMINAL HYSTERECTOMY    . APPENDECTOMY    . LUMBAR LAMINECTOMY    . PARATHYROIDECTOMY    . TOTAL HIP ARTHROPLASTY     x 4    Family Psychiatric History: I have reviewed family psychiatric history from my progress note on 03/21/2017  Family History:  Family History  Problem Relation Age of Onset  . Stroke Mother   . Hypertension Mother   . Heart disease Father   . Hypertension Father     Social History: I have reviewed social history from my progress note on 03/21/2017 Social History   Socioeconomic History  . Marital status: Widowed    Spouse name: Not on file  . Number of children: Not on file  .  Years of education: Not on file  . Highest education level: Not on file  Occupational History  . Not on file  Tobacco Use  . Smoking status: Never Smoker  . Smokeless tobacco: Never Used  . Tobacco comment: quit 1954  Substance and Sexual Activity  . Alcohol use: No  . Drug use: No  . Sexual activity: Not Currently  Other Topics Concern  . Not on file  Social History Narrative  . Not on file   Social Determinants of Health   Financial Resource Strain:   . Difficulty of Paying Living Expenses:   Food Insecurity:   . Worried About Sales executive in the Last Year:   . Arboriculturist in the Last Year:   Transportation Needs:   . Film/video editor (Medical):   Marland Kitchen Lack of Transportation (Non-Medical):   Physical Activity:   . Days of Exercise per Week:   . Minutes of Exercise per Session:   Stress:   . Feeling of Stress :   Social Connections:   . Frequency of Communication with Friends and Family:   . Frequency of Social Gatherings with Friends and Family:   . Attends Religious Services:   . Active Member of Clubs or Organizations:   . Attends Archivist Meetings:   Marland Kitchen Marital Status:     Allergies:  Allergies  Allergen Reactions  . Sulfa Antibiotics Rash and Itching    Other reaction(s): Diarrhea and vomiting (finding)  . Erythromycin Nausea And Vomiting    Other reaction(s): Diarrhea and vomiting (finding)  . Aspirin Other (See Comments), Tinitus and Nausea And Vomiting    Ringing of the ears, caused hearing loss both ears Ringing in ears  . Contrast Media [Iodinated Diagnostic Agents] Rash and Hives  . Latex Rash and Itching  . Penicillin G Rash  . Penicillins Rash    Other reaction(s): UNKNOWN  . Tape Rash    Other reaction(s): UNKNOWN Adhesive Other reaction(s): UNKNOWN Adhesive    Metabolic Disorder Labs: No results found for: HGBA1C, MPG No results found for: PROLACTIN No results found for: CHOL, TRIG, HDL, CHOLHDL, VLDL, LDLCALC Lab Results  Component Value Date   TSH 2.47 12/08/2018   TSH 0.96 10/15/2018    Therapeutic Level Labs: No results found for: LITHIUM No results found for: VALPROATE No components found for:  CBMZ  Current Medications: Current Outpatient Medications  Medication Sig Dispense Refill  . acetaminophen (TYLENOL) 500 MG tablet Take 1 tablet (500 mg total) by mouth every 6 (six) hours as needed for moderate pain. Also give 500 mg three times a day for 7  Days. 60 tablet 0  . albuterol (VENTOLIN HFA) 108 (90 Base) MCG/ACT inhaler Inhale 2 puffs into the  lungs every 6 (six) hours as needed for wheezing or shortness of breath. 8 g 2  . ALPRAZolam (XANAX) 0.25 MG tablet Take 0.25 mg by mouth at bedtime as needed for anxiety.    . AMBULATORY NON FORMULARY MEDICATION Medication Name: incentive spirometry Use as directed 1 each 0  . bismuth subsalicylate (PEPTO BISMOL) 262 MG/15ML suspension Take 30 mLs by mouth as needed.    Marland Kitchen buPROPion (WELLBUTRIN) 100 MG tablet Take 1 tablet (100 mg total) by mouth every morning. 90 tablet 2  . CALCIUM CITRATE PO Take 1 tablet by mouth in the morning and at bedtime.     . carbamide peroxide (DEBROX) 6.5 % OTIC solution Place 5 drops into both ears as  needed.    . celecoxib (CELEBREX) 200 MG capsule Take 200 mg by mouth daily.    . cetirizine (ZYRTEC ALLERGY) 10 MG tablet Take 10 mg by mouth as needed for allergies.    . cholestyramine (QUESTRAN) 4 g packet ADD 1 PACKET TO 8OZ OF FLUID AND DRINK 1-2 TIMES A DAY AS NEEDED LOOSE STOOLS 180 each 1  . Cyanocobalamin (VITAMIN B12) 500 MCG TABS Take by mouth.    . diphenoxylate-atropine (LOMOTIL) 2.5-0.025 MG tablet Take by mouth. Take 1 tablet by mouth 2 (two) times daily as needed for Diarrhea As need abdominal pain/diarrhea.    . fluticasone (FLONASE) 50 MCG/ACT nasal spray USE 2 SPRAYS NASALLY DAILY 48 g 2  . furosemide (LASIX) 40 MG tablet TAKE 1 TABLET DAILY 90 tablet 4  . guaifenesin (HUMIBID E) 400 MG TABS tablet Take 400 mg by mouth in the morning and at bedtime.    . hydrocortisone 1 % lotion Apply 1 application topically as needed for itching.    . hydrOXYzine (ATARAX/VISTARIL) 25 MG tablet Take 0.5 tablets (12.5 mg total) by mouth as directed. 2-3 times a week - Only for severe panic attacks 45 tablet 1  . levocetirizine (XYZAL) 5 MG tablet TAKE 1 TABLET AT BEDTIME EVERY NIGHT 90 tablet 3  . levothyroxine (SYNTHROID, LEVOTHROID) 50 MCG tablet TAKE 1 TABLET DAILY 90 tablet 4  . lidocaine (LIDODERM) 5 % Place 1 patch onto the skin as needed. Remove & Discard  patch within 12 hours or as directed by MD    . loperamide (IMODIUM) 2 MG capsule Take 2 mg by mouth as needed for diarrhea or loose stools.    . magnesium hydroxide (MILK OF MAGNESIA) 400 MG/5ML suspension Take by mouth daily as needed for mild constipation. 2 tbsp    . magnesium oxide (MAG-OX) 400 MG tablet Take 400 mg by mouth daily.    . meclizine (ANTIVERT) 12.5 MG tablet Take 12.5 mg by mouth every 4 (four) hours as needed for dizziness.    . melatonin 5 MG TABS Take 5 mg by mouth at bedtime.    . Mouthwashes (MOUTHWASH/GARGLE MT) Use as directed in the mouth or throat. 1 tsp by mouth as needed for sore mouth and tongue    . Multiple Vitamin (MULTIVITAMIN) tablet Take 1 tablet by mouth daily.    Marland Kitchen nystatin (MYCOSTATIN/NYSTOP) powder Apply 1 application topically 2 (two) times daily as needed.    . ondansetron (ZOFRAN) 4 MG tablet Take 4 mg by mouth as needed for nausea or vomiting.    . OXYGEN Inhale 2 L into the lungs continuous as needed.    . pantoprazole (PROTONIX) 20 MG tablet Take 1 tablet (20 mg total) by mouth daily. 90 tablet 1  . Phenylephrine-DM-GG-APAP (DELSYM COUGH/COLD DAYTIME) 5-10-200-325 MG/10ML LIQD Take 5 mLs by mouth as needed.    . Probiotic Product (PROBIOTIC COLON SUPPORT PO) Take 1 capsule by mouth daily.    . simvastatin (ZOCOR) 20 MG tablet TAKE 1 TABLET DAILY 90 tablet 4  . spironolactone (ALDACTONE) 25 MG tablet Take 1 tablet (25 mg total) by mouth daily. 90 tablet 0  . Suvorexant (BELSOMRA) 10 MG TABS Take 10 mg by mouth at bedtime. 90 tablet 1  . tamsulosin (FLOMAX) 0.4 MG CAPS capsule TAKE 1 CAPSULE DAILY 90 capsule 4  . tiotropium (SPIRIVA HANDIHALER) 18 MCG inhalation capsule Place 1 capsule (18 mcg total) into inhaler and inhale daily. 90 capsule 3  . venlafaxine XR (EFFEXOR-XR) 75  MG 24 hr capsule Take 1 capsule (75 mg total) by mouth daily with breakfast. 90 capsule 1   No current facility-administered medications for this visit.      Musculoskeletal: Strength & Muscle Tone: UTA Gait & Station: Uses a walker Patient leans: N/A  Psychiatric Specialty Exam: Review of Systems  Musculoskeletal:       Left shoulder joint pain  Skin:       Does report skin picking on and off  Psychiatric/Behavioral: Negative for agitation, behavioral problems, confusion, decreased concentration, dysphoric mood, hallucinations, self-injury, sleep disturbance and suicidal ideas. The patient is not nervous/anxious and is not hyperactive.   All other systems reviewed and are negative.   There were no vitals taken for this visit.There is no height or weight on file to calculate BMI.  General Appearance: Casual  Eye Contact:  Fair  Speech:  Clear and Coherent  Volume:  Normal  Mood:  Euthymic  Affect:  Congruent  Thought Process:  Goal Directed and Descriptions of Associations: Intact  Orientation:  Full (Time, Place, and Person)  Thought Content: Logical   Suicidal Thoughts:  No  Homicidal Thoughts:  No  Memory:  Immediate;   limited Recent;   limited Remote;   limited  Judgement:  Fair  Insight:  Fair  Psychomotor Activity:  Normal  Concentration:  Concentration: Fair and Attention Span: Fair  Recall:  [employer] of Knowledge: Fair  Language: Fair  Akathisia:  No  Handed:  Right  AIMS (if indicated): UTA  Assets:  Communication Skills Desire for Improvement Housing Social Support  ADL's:  Intact  Cognition: WNL  Sleep:  Fair   Screenings: PHQ2-9     Procedure visit from 09/04/2015 in [facility] Procedure visit from 10/03/2014 in [facility] Visit from 09/21/2014 in [facility]  PHQ-2 Total Score  0  0  1       Assessment and Plan: Sandra Brown is a 86 year old Caucasian female who has a history of depression, COPD, hypothyroidism, hypertension, renal function  abnormalities was evaluated by telemedicine today.  Patient is currently stable on current medication regimen, however does have skin picking and will benefit from CBT.  Plan as noted below.  Plan MDD in remission Venlafaxine extended release 75 mg p.o. daily. Wellbutrin 100 mg p.o. daily  Skin picking disorder-improving Will refer patient again for CBT.  Insomnia-improving Belsomra 10 mg p.o. nightly. Reviewed [place] controlled substance database.  Collateral information was obtained from nurse-Audrey as summarized above.  I have spent atleast 20 minutes non face to face with patient today. More than 50 % of the time was spent for preparing to see the patient ( e.g., review of test, records ),  ordering medications and test ,psychoeducation and supportive psychotherapy and care coordination,as well as documenting clinical information in electronic health record. This note was generated in part or whole with voice recognition software. Voice recognition is usually quite accurate but there are transcription errors that can and very often do occur. I apologize for any typographical errors that were not detected and corrected.        Ursula Alert, MD 06/09/2019, 11:31 AM

## 2019-07-07 ENCOUNTER — Telehealth: Payer: Self-pay

## 2019-07-07 DIAGNOSIS — F5105 Insomnia due to other mental disorder: Secondary | ICD-10-CM

## 2019-07-07 MED ORDER — HYDROXYZINE HCL 25 MG PO TABS: 12.5000 mg | ORAL_TABLET | Freq: Every day | ORAL | 1 refills | Status: DC | PRN

## 2019-07-07 NOTE — Telephone Encounter (Signed)
Returned call to [place] at [facility] Per [name] she has not heard back from the therapist yet.  Patient wants therapy for skin picking.  Will change hydroxyzine to 12.5 to 25 mg daily as needed for severe skin picking/itching and anxiety.  Advised to limit use We will pass the message to the front desk to schedule the patient with our new therapist.

## 2019-07-07 NOTE — Telephone Encounter (Signed)
adury from [facility] called [phone] states that you was suppose to do something about her itching and picking. they need to speak with you about what to do.

## 2019-07-20 ENCOUNTER — Other Ambulatory Visit: Payer: Self-pay

## 2019-07-20 ENCOUNTER — Ambulatory Visit (INDEPENDENT_AMBULATORY_CARE_PROVIDER_SITE_OTHER): Payer: Medicare Other | Admitting: Licensed Clinical Social Worker

## 2019-07-20 ENCOUNTER — Encounter: Payer: Self-pay | Admitting: Licensed Clinical Social Worker

## 2019-07-20 DIAGNOSIS — F424 Excoriation (skin-picking) disorder: Secondary | ICD-10-CM | POA: Diagnosis not present

## 2019-07-20 DIAGNOSIS — F330 Major depressive disorder, recurrent, mild: Secondary | ICD-10-CM
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
## 2019-07-24 NOTE — Progress Notes (Deleted)
[place]  [phone] [phone]  ID: Sandra Brown OB: [birth year]  MR#: [id number]  ZHG#:[id number]  Patient Care Team: Paulene Floor as PCP - General (Physician Assistant)  CHIEF COMPLAINT: Iron deficiency anemia.  INTERVAL HISTORY: Patient returns to clinic today for further evaluation, discussion of her laboratory work, and initiation of IV Feraheme.  She continues to have chronic weakness and fatigue.  She has no neurologic complaints.  She denies any recent fevers or illnesses.  She has a good appetite and denies weight loss.  She has no chest pain, shortness of breath, cough, or hemoptysis.  He denies any nausea, vomiting, constipation, or diarrhea.  She has no melena or hematochezia.  She has no urinary complaints.  Patient offers no specific complaints today.  REVIEW OF SYSTEMS:   Review of Systems  Constitutional: Positive for malaise/fatigue. Negative for fever and weight loss.  Respiratory: Negative.  Negative for cough, hemoptysis and shortness of breath.   Cardiovascular: Negative.  Negative for chest pain and leg swelling.  Gastrointestinal: Negative.  Negative for abdominal pain, blood in stool and melena.  Genitourinary: Positive for hematuria.  Musculoskeletal: Negative.  Negative for back pain.  Skin: Negative.  Negative for rash.  Neurological: Positive for weakness. Negative for focal weakness and headaches.  Psychiatric/Behavioral: Negative.  The patient is not nervous/anxious.     As per HPI. Otherwise, a complete review of systems is negative.  PAST MEDICAL HISTORY: Past Medical History:  Diagnosis Date  . Cataract   . COPD (chronic obstructive pulmonary disease) ([place])   . Depression   . Difficulty swallowing   . Frequent headaches   . Hearing loss   . Hypertension   . Hypothyroidism   . Reflux     PAST SURGICAL HISTORY: Past Surgical History:  Procedure Laterality Date  . ABDOMINAL  HYSTERECTOMY    . APPENDECTOMY    . LUMBAR LAMINECTOMY    . PARATHYROIDECTOMY    . TOTAL HIP ARTHROPLASTY     x 4    FAMILY HISTORY: Family History  Problem Relation Age of Onset  . Stroke Mother   . Hypertension Mother   . Heart disease Father   . Hypertension Father     ADVANCED DIRECTIVES (Y/N):  N  HEALTH MAINTENANCE: Social History   Tobacco Use  . Smoking status: Never Smoker  . Smokeless tobacco: Never Used  . Tobacco comment: quit 1954  Vaping Use  . Vaping Use: Never used  Substance Use Topics  . Alcohol use: No  . Drug use: No     Colonoscopy:  PAP:  Bone density:  Lipid panel:  Allergies  Allergen Reactions  . Sulfa Antibiotics Rash and Itching    Other reaction(s): Diarrhea and vomiting (finding)  . Erythromycin Nausea And Vomiting    Other reaction(s): Diarrhea and vomiting (finding)  . Aspirin Other (See Comments), Tinitus and Nausea And Vomiting    Ringing of the ears, caused hearing loss both ears Ringing in ears  . Contrast Media [Iodinated Diagnostic Agents] Rash and Hives  . Latex Rash and Itching  . Penicillin G Rash  . Penicillins Rash    Other reaction(s): UNKNOWN  . Tape Rash    Other reaction(s): UNKNOWN Adhesive Other reaction(s): UNKNOWN Adhesive    Current Outpatient Medications  Medication Sig Dispense Refill  . acetaminophen (TYLENOL) 500 MG tablet Take 1 tablet (500 mg total) by mouth every 6 (six) hours as needed for moderate pain. Also give 500  mg three times a day for 7  Days. 60 tablet 0  . albuterol (VENTOLIN HFA) 108 (90 Base) MCG/ACT inhaler Inhale 2 puffs into the lungs every 6 (six) hours as needed for wheezing or shortness of breath. 8 g 2  . ALPRAZolam (XANAX) 0.25 MG tablet Take 0.25 mg by mouth at bedtime as needed for anxiety.    . AMBULATORY NON FORMULARY MEDICATION Medication Name: incentive spirometry Use as directed 1 each 0  . bismuth subsalicylate (PEPTO BISMOL) 262 MG/15ML suspension Take 30 mLs by  mouth as needed.    Marland Kitchen buPROPion (WELLBUTRIN) 100 MG tablet Take 1 tablet (100 mg total) by mouth every morning. 90 tablet 2  . CALCIUM CITRATE PO Take 1 tablet by mouth in the morning and at bedtime.     . carbamide peroxide (DEBROX) 6.5 % OTIC solution Place 5 drops into both ears as needed.    . celecoxib (CELEBREX) 200 MG capsule Take 200 mg by mouth daily.    . cetirizine (ZYRTEC ALLERGY) 10 MG tablet Take 10 mg by mouth as needed for allergies.    . cholestyramine (QUESTRAN) 4 g packet ADD 1 PACKET TO 8OZ OF FLUID AND DRINK 1-2 TIMES A DAY AS NEEDED LOOSE STOOLS 180 each 1  . Cyanocobalamin (VITAMIN B12) 500 MCG TABS Take by mouth.    . diphenoxylate-atropine (LOMOTIL) 2.5-0.025 MG tablet Take by mouth. Take 1 tablet by mouth 2 (two) times daily as needed for Diarrhea As need abdominal pain/diarrhea.    . fluticasone (FLONASE) 50 MCG/ACT nasal spray USE 2 SPRAYS NASALLY DAILY 48 g 2  . furosemide (LASIX) 40 MG tablet TAKE 1 TABLET DAILY 90 tablet 4  . guaifenesin (HUMIBID E) 400 MG TABS tablet Take 400 mg by mouth in the morning and at bedtime.    . hydrocortisone 1 % lotion Apply 1 application topically as needed for itching.    . hydrOXYzine (ATARAX/VISTARIL) 25 MG tablet Take 0.5-1 tablets (12.5-25 mg total) by mouth daily as needed. For skin picking, itching and anxiety attacks 90 tablet 1  . levocetirizine (XYZAL) 5 MG tablet TAKE 1 TABLET AT BEDTIME EVERY NIGHT 90 tablet 3  . levothyroxine (SYNTHROID, LEVOTHROID) 50 MCG tablet TAKE 1 TABLET DAILY 90 tablet 4  . lidocaine (LIDODERM) 5 % Place 1 patch onto the skin as needed. Remove & Discard patch within 12 hours or as directed by MD    . loperamide (IMODIUM) 2 MG capsule Take 2 mg by mouth as needed for diarrhea or loose stools.    . magnesium hydroxide (MILK OF MAGNESIA) 400 MG/5ML suspension Take by mouth daily as needed for mild constipation. 2 tbsp    . magnesium oxide (MAG-OX) 400 MG tablet Take 400 mg by mouth daily.    .  meclizine (ANTIVERT) 12.5 MG tablet Take 12.5 mg by mouth every 4 (four) hours as needed for dizziness.    . melatonin 5 MG TABS Take 5 mg by mouth at bedtime.    . Mouthwashes (MOUTHWASH/GARGLE MT) Use as directed in the mouth or throat. 1 tsp by mouth as needed for sore mouth and tongue    . Multiple Vitamin (MULTIVITAMIN) tablet Take 1 tablet by mouth daily.    Marland Kitchen nystatin (MYCOSTATIN/NYSTOP) powder Apply 1 application topically 2 (two) times daily as needed.    . ondansetron (ZOFRAN) 4 MG tablet Take 4 mg by mouth as needed for nausea or vomiting.    . OXYGEN Inhale 2 L into the lungs continuous  as needed.    . pantoprazole (PROTONIX) 20 MG tablet Take 1 tablet (20 mg total) by mouth daily. 90 tablet 1  . Phenylephrine-DM-GG-APAP (DELSYM COUGH/COLD DAYTIME) 5-10-200-325 MG/10ML LIQD Take 5 mLs by mouth as needed.    . Probiotic Product (PROBIOTIC COLON SUPPORT PO) Take 1 capsule by mouth daily.    . simvastatin (ZOCOR) 20 MG tablet TAKE 1 TABLET DAILY 90 tablet 4  . spironolactone (ALDACTONE) 25 MG tablet Take 1 tablet (25 mg total) by mouth daily. 90 tablet 0  . Suvorexant (BELSOMRA) 10 MG TABS Take 10 mg by mouth at bedtime. 90 tablet 1  . tamsulosin (FLOMAX) 0.4 MG CAPS capsule TAKE 1 CAPSULE DAILY 90 capsule 4  . tiotropium (SPIRIVA HANDIHALER) 18 MCG inhalation capsule Place 1 capsule (18 mcg total) into inhaler and inhale daily. 90 capsule 3  . venlafaxine XR (EFFEXOR-XR) 75 MG 24 hr capsule Take 1 capsule (75 mg total) by mouth daily with breakfast. 90 capsule 1   No current facility-administered medications for this visit.    OBJECTIVE: There were no vitals filed for this visit.   There is no height or weight on file to calculate BMI.    ECOG FS:1 - Symptomatic but completely ambulatory  General: Well-developed, well-nourished, no acute distress. Eyes: Pink conjunctiva, anicteric sclera. HEENT: Normocephalic, moist mucous membranes. Lungs: No audible wheezing or coughing. Heart:  Regular rate and rhythm. Abdomen: Soft, nontender, no obvious distention. Musculoskeletal: No edema, cyanosis, or clubbing. Neuro: Alert, answering all questions appropriately. Cranial nerves grossly intact. Skin: No rashes or petechiae noted. Psych: Normal affect.  LAB RESULTS:  Lab Results  Component Value Date   NA 142 12/08/2018   K 4.3 12/08/2018   CL 107 12/08/2018   CO2 26 11/24/2014   GLUCOSE 90 11/24/2014   BUN 36 (A) 12/08/2018   CREATININE 1.5 (A) 12/08/2018   CALCIUM 8.6 (A) 12/08/2018   PROT 6.7 11/24/2014   ALBUMIN 3.7 12/08/2018   AST 15 12/08/2018   ALT 10 12/08/2018   ALKPHOS 68 12/08/2018   BILITOT 0.5 11/24/2014   GFRNONAA 31 12/08/2018   GFRAA 36 12/08/2018    Lab Results  Component Value Date   WBC 6.6 03/19/2019   NEUTROABS 3,023 02/11/2019   HGB 10.0 (L) 03/19/2019   HCT 32.9 (L) 03/19/2019   MCV 89.6 03/19/2019   PLT 324 03/19/2019    Lab Results  Component Value Date   IRON 47 03/19/2019   TIBC 438 03/19/2019   IRONPCTSAT 11 03/19/2019   Lab Results  Component Value Date   FERRITIN 20 03/19/2019     STUDIES: No results found.  ASSESSMENT: Iron deficiency anemia.  PLAN:    1. Iron deficiency anemia: Although patient's hemoglobin is slightly improved to 10.0, her iron panel is in the low/normal range and she will benefit from 510 mg IV Feraheme.  Her reticulocyte count is inappropriately normal for this level of anemia.  All of her other laboratory work is either negative or within normal limits.  [facility] myeloid panel revealed a variant of unknown clinical significance.  Proceed with treatment today.  Return to clinic in 3 months with repeat laboratory work and further evaluation.   2.  Hematuria: Patient reports this is chronic and unchanged for decades and no etiology has ever been determined.  I spent a total of 30 minutes reviewing chart data, face-to-face evaluation with the patient, counseling and coordination of care as  detailed above.   Patient expressed understanding and was  in agreement with this plan. She also understands that She can call clinic at any time with any questions, concerns, or complaints.    Lloyd Huger, MD   07/24/2019 7:41 AM

## 2019-07-26 ENCOUNTER — Other Ambulatory Visit: Payer: Self-pay | Admitting: *Deleted

## 2019-07-26 DIAGNOSIS — D509 Iron deficiency anemia, unspecified: Secondary | ICD-10-CM

## 2019-07-26 NOTE — Progress Notes (Signed)
c 

## 2019-07-27 ENCOUNTER — Telehealth: Payer: Self-pay | Admitting: Oncology

## 2019-07-27 ENCOUNTER — Inpatient Hospital Stay: Payer: Medicare Other

## 2019-07-27 ENCOUNTER — Inpatient Hospital Stay: Payer: Medicare Other | Admitting: Oncology

## 2019-07-27 NOTE — Telephone Encounter (Signed)
Received secure chat from colette that Sandra Brown had called on behalf of pt. She didn't relalize the pt had a appt scheduled today. I attempted to contact her with no answer and VM was full so unable to leave message.

## 2019-07-29 ENCOUNTER — Ambulatory Visit (INDEPENDENT_AMBULATORY_CARE_PROVIDER_SITE_OTHER): Payer: Medicare Other | Admitting: Licensed Clinical Social Worker

## 2019-07-29 ENCOUNTER — Encounter: Payer: Self-pay | Admitting: Licensed Clinical Social Worker

## 2019-07-29 ENCOUNTER — Other Ambulatory Visit: Payer: Self-pay

## 2019-07-29 DIAGNOSIS — F424 Excoriation (skin-picking) disorder: Secondary | ICD-10-CM

## 2019-07-29 NOTE — Progress Notes (Signed)
Patient Location: Home  Provider Location: Home Office   Virtual Visit via Video Note  I connected with Sandra Brown on 07/29/19 at  1:00 PM EDT by a video enabled telemedicine application and verified that I am speaking with the correct person using two identifiers.   I discussed the limitations of evaluation and management by telemedicine and the availability of in person appointments. The patient expressed understanding and agreed to proceed.  THERAPY PROGRESS NOTE  Session Time: 15 Minutes  Participation Level: Active  Behavioral Response: Well GroomedAlertEuthymic  Type of Therapy: Individual Therapy  Treatment Goals addressed: Anxiety  Interventions: Motivational Interviewing  Summary: Sandra Brown is a 86 y.o. female who presents with anxiety sxs. Pt reported that she does not know what triggers her compulsion to skin pick and has been engaging in this behavior for about 5 years. Pt reported wearing long sleeves helps to reduce the skin picking some. Pt reported she was not sure about scheduling another session because she is worried about the cost and wants to consult with her daughter before scheduling next appointment.  Suicidal/Homicidal: No  Therapist Response: Therapist met with patient for first session since [facility]. Therapist and patient completed treatment plan using the impulse control template. Pt in agreement with plan.   Plan: Pt to callback if interested in engaging in therapy.  Diagnosis: Axis I: Skin-Picking Disorder    Axis II: N/A  Follow Up Instructions:  I discussed the treatment plan with the patient. The patient was provided an opportunity to ask questions and all were answered. The patient agreed with the plan and demonstrated an understanding of the instructions.   The patient was advised to call back or seek an in-person evaluation if the symptoms worsen or if the condition fails to improve as anticipated.  I provided 15 minutes of  non-face-to-face time during this encounter.  Glyn Gerads Wynelle Link, LCSW, LCAS

## 2019-09-08 ENCOUNTER — Telehealth: Payer: Self-pay | Admitting: Physician Assistant

## 2019-09-08 ENCOUNTER — Other Ambulatory Visit: Payer: Self-pay | Admitting: Physician Assistant

## 2019-09-08 NOTE — Telephone Encounter (Signed)
[facility] faxed refill request for the following medications:  Mouthwashes (MOUTHWASH/GARGLE MT) - Miracle Mouthwash  Please advise.  Thanks, [employer]

## 2019-09-08 NOTE — Telephone Encounter (Signed)
Sandra Brown with [facility] is calling and need order for pt to have a flu shot no cover sheet is needed with order. Please fax to [phone]

## 2019-09-09 NOTE — Progress Notes (Signed)
Virtual telephone visit    Virtual Visit via Telephone Note   This visit type was conducted due to national recommendations for restrictions regarding the COVID-19 Pandemic (e.g. social distancing) in an effort to limit this patient's exposure and mitigate transmission in our community. Due to her co-morbid illnesses, this patient is at least at moderate risk for complications without adequate follow up. This format is felt to be most appropriate for this patient at this time. The patient did not have access to video technology or had technical difficulties with video requiring transitioning to audio format only (telephone). Physical exam was limited to content and character of the telephone converstion.     Patient location: home Provider location: [place] involved in the visit: patient, provider  I discussed the limitations of evaluation and management by telemedicine and the availability of in person appointments. The patient expressed understanding and agreed to proceed.   Visit Date: 09/10/2019  Today's healthcare provider: Lavon Paganini, MD   Chief Complaint  Patient presents with  . Thrush   Subjective    HPI  Patient C/O thrush x three days. Patient reports she uses inhalers daily and that causes the thrush. Patient reports this is recurrent and needs oral mouth rinse. Patient denies any other symptoms.     Allergies  Allergen Reactions  . Sulfa Antibiotics Rash and Itching    Other reaction(s): Diarrhea and vomiting (finding)  . Erythromycin Nausea And Vomiting    Other reaction(s): Diarrhea and vomiting (finding)  . Aspirin Other (See Comments), Tinitus and Nausea And Vomiting    Ringing of the ears, caused hearing loss both ears Ringing in ears  . Contrast Media [Iodinated Diagnostic Agents] Rash and Hives  . Latex Rash and Itching  . Penicillin G Rash  . Penicillins Rash    Other reaction(s): UNKNOWN  . Tape Rash    Other  reaction(s): UNKNOWN Adhesive Other reaction(s): UNKNOWN Adhesive      Medications: Outpatient Medications Prior to Visit  Medication Sig  . acetaminophen (TYLENOL) 500 MG tablet Take 1 tablet (500 mg total) by mouth every 6 (six) hours as needed for moderate pain. Also give 500 mg three times a day for 7  Days.  Marland Kitchen albuterol (VENTOLIN HFA) 108 (90 Base) MCG/ACT inhaler Inhale 2 puffs into the lungs every 6 (six) hours as needed for wheezing or shortness of breath.  . ALPRAZolam (XANAX) 0.25 MG tablet Take 0.25 mg by mouth at bedtime as needed for anxiety.  . AMBULATORY NON FORMULARY MEDICATION Medication Name: incentive spirometry Use as directed  . bismuth subsalicylate (PEPTO BISMOL) 262 MG/15ML suspension Take 30 mLs by mouth as needed.  Marland Kitchen buPROPion (WELLBUTRIN) 100 MG tablet Take 1 tablet (100 mg total) by mouth every morning.  Marland Kitchen CALCIUM CITRATE PO Take 1 tablet by mouth in the morning and at bedtime.   . carbamide peroxide (DEBROX) 6.5 % OTIC solution Place 5 drops into both ears as needed.  . celecoxib (CELEBREX) 200 MG capsule Take 200 mg by mouth daily.  . cetirizine (ZYRTEC ALLERGY) 10 MG tablet Take 10 mg by mouth as needed for allergies.  . cholestyramine (QUESTRAN) 4 g packet ADD 1 PACKET TO 8OZ OF FLUID AND DRINK 1-2 TIMES A DAY AS NEEDED LOOSE STOOLS  . Cyanocobalamin (VITAMIN B12) 500 MCG TABS Take by mouth.  . diphenoxylate-atropine (LOMOTIL) 2.5-0.025 MG tablet Take by mouth. Take 1 tablet by mouth 2 (two) times daily as needed for Diarrhea As need  abdominal pain/diarrhea.  . fluticasone (FLONASE) 50 MCG/ACT nasal spray USE 2 SPRAYS NASALLY DAILY  . furosemide (LASIX) 40 MG tablet TAKE 1 TABLET DAILY  . guaifenesin (HUMIBID E) 400 MG TABS tablet Take 400 mg by mouth in the morning and at bedtime.  . hydrocortisone 1 % lotion Apply 1 application topically as needed for itching.  . hydrOXYzine (ATARAX/VISTARIL) 25 MG tablet Take 0.5-1 tablets (12.5-25 mg total) by mouth  daily as needed. For skin picking, itching and anxiety attacks  . levocetirizine (XYZAL) 5 MG tablet TAKE 1 TABLET AT BEDTIME EVERY NIGHT  . levothyroxine (SYNTHROID, LEVOTHROID) 50 MCG tablet TAKE 1 TABLET DAILY  . lidocaine (LIDODERM) 5 % Place 1 patch onto the skin as needed. Remove & Discard patch within 12 hours or as directed by MD  . loperamide (IMODIUM) 2 MG capsule Take 2 mg by mouth as needed for diarrhea or loose stools.  . magnesium hydroxide (MILK OF MAGNESIA) 400 MG/5ML suspension Take by mouth daily as needed for mild constipation. 2 tbsp  . magnesium oxide (MAG-OX) 400 MG tablet Take 400 mg by mouth daily.  . meclizine (ANTIVERT) 12.5 MG tablet Take 12.5 mg by mouth every 4 (four) hours as needed for dizziness.  . melatonin 5 MG TABS Take 5 mg by mouth at bedtime.  . Mouthwashes (MOUTHWASH/GARGLE MT) Use as directed in the mouth or throat. 1 tsp by mouth as needed for sore mouth and tongue  . Multiple Vitamin (MULTIVITAMIN) tablet Take 1 tablet by mouth daily.  Marland Kitchen nystatin (MYCOSTATIN/NYSTOP) powder Apply 1 application topically 2 (two) times daily as needed.  . ondansetron (ZOFRAN) 4 MG tablet Take 4 mg by mouth as needed for nausea or vomiting.  . OXYGEN Inhale 2 L into the lungs continuous as needed.  . pantoprazole (PROTONIX) 20 MG tablet Take 1 tablet (20 mg total) by mouth daily.  Marland Kitchen Phenylephrine-DM-GG-APAP (DELSYM COUGH/COLD DAYTIME) 5-10-200-325 MG/10ML LIQD Take 5 mLs by mouth as needed.  . Probiotic Product (PROBIOTIC COLON SUPPORT PO) Take 1 capsule by mouth daily.  . simvastatin (ZOCOR) 20 MG tablet TAKE 1 TABLET DAILY  . spironolactone (ALDACTONE) 25 MG tablet Take 1 tablet (25 mg total) by mouth daily.  . Suvorexant (BELSOMRA) 10 MG TABS Take 10 mg by mouth at bedtime.  . tamsulosin (FLOMAX) 0.4 MG CAPS capsule TAKE 1 CAPSULE DAILY  . tiotropium (SPIRIVA HANDIHALER) 18 MCG inhalation capsule Place 1 capsule (18 mcg total) into inhaler and inhale daily.  Marland Kitchen  venlafaxine XR (EFFEXOR-XR) 75 MG 24 hr capsule Take 1 capsule (75 mg total) by mouth daily with breakfast.   No facility-administered medications prior to visit.    Review of Systems  Constitutional: Negative.   Respiratory: Negative.   Cardiovascular: Negative.   Gastrointestinal: Negative.   Neurological: Negative.   Psychiatric/Behavioral: Negative.       Objective    There were no vitals taken for this visit.   Speaking in full sentences in NAD   Assessment & Plan     1. Thrush -Reportedly a recurrent problem for the patient -Discussed importance of rinsing mouth after using inhalers to prevent this in the future -We will prescribe nystatin swish and swallow until resolution -Discussed limitations of evaluation with phone visit, but patient unable to be seen in office and unable to use video visit today -Discussed strict return precautions and if this is not improving, recommend someone evaluate in person.  Meds ordered this encounter  Medications  . nystatin (MYCOSTATIN) 100000  UNIT/ML suspension    Sig: Take 5 mLs (500,000 Units total) by mouth 4 (four) times daily. Swish and swallow    Dispense:  60 mL    Refill:  0     Return if symptoms worsen or fail to improve.    I discussed the assessment and treatment plan with the patient. The patient was provided an opportunity to ask questions and all were answered. The patient agreed with the plan and demonstrated an understanding of the instructions.   The patient was advised to call back or seek an in-person evaluation if the symptoms worsen or if the condition fails to improve as anticipated.  I provided 11 minutes of non-face-to-face time during this encounter.  I, Lavon Paganini, MD, have reviewed all documentation for this visit. The documentation on 09/10/19 for the exam, diagnosis, procedures, and orders are all accurate and complete.   Rogerio Boutelle, Dionne Bucy, MD, MPH [employer]

## 2019-09-10 ENCOUNTER — Telehealth: Payer: Self-pay

## 2019-09-10 ENCOUNTER — Encounter: Payer: Self-pay | Admitting: Family Medicine

## 2019-09-10 ENCOUNTER — Ambulatory Visit (INDEPENDENT_AMBULATORY_CARE_PROVIDER_SITE_OTHER): Payer: Medicare Other | Admitting: Family Medicine

## 2019-09-10 ENCOUNTER — Encounter: Payer: Self-pay | Admitting: Physician Assistant

## 2019-09-10 DIAGNOSIS — B370 Candidal stomatitis: Secondary | ICD-10-CM

## 2019-09-10 MED ORDER — NYSTATIN 100000 UNIT/ML MT SUSP: 5.0000 mL | Freq: Four times a day (QID) | OROMUCOSAL | 0 refills | Status: DC

## 2019-09-10 NOTE — Telephone Encounter (Signed)
Order written for Sandra Brown to sign.

## 2019-09-10 NOTE — Patient Instructions (Signed)
Oral Thrush, Adult  Oral thrush is an infection in your mouth and throat. It causes white patches on your tongue and in your mouth. Follow these instructions at home: Helping with soreness   To lessen your pain: ? Drink cold liquids, like water and iced tea. ? Eat frozen ice pops or frozen juices. ? Eat foods that are easy to swallow, like gelatin and ice cream. ? Drink from a straw if the patches in your mouth are painful. General instructions  Take or use over-the-counter and prescription medicines only as told by your doctor. Medicine for oral thrush may be something to swallow, or it may be something to put on the infected area.  Eat plain yogurt that has live cultures in it. Read the label to make sure.  If you wear dentures: ? Take out your dentures before you go to bed. ? Brush them well. ? Soak them in a denture cleaner.  Rinse your mouth with warm salt-water many times a day. To make the salt-water mixture, completely dissolve 1/2-1 teaspoon of salt in 1 cup of warm water. Contact a doctor if:  Your problems are getting worse.  Your problems do not get better in less than 7 days with treatment.  Your infection is spreading. This may show as white patches on the skin outside of your mouth.  You are nursing your baby and you have redness and pain in the nipples. This information is not intended to replace advice given to you by your health care provider. Make sure you discuss any questions you have with your health care provider. Document Revised: 03/28/2017 Document Reviewed: 09/18/2015 Elsevier Patient Education  2020 [employer] Inc[employer]  

## 2019-09-10 NOTE — Telephone Encounter (Signed)
Copied from [place] [id number]. Topic: General - Other >> Sep 10, 2019  9:30 AM Yvette Rack wrote: Reason for CRM: Pt daughter [name] stated pt just spoke with the provider and requested the wrong pharmacy for the Rx for Mouthwashes (MOUTHWASH/GARGLE MT). [name] asked that the Rx request for Mouthwashes (MOUTHWASH/GARGLE MT) be sent to [facility].

## 2019-09-15 ENCOUNTER — Other Ambulatory Visit: Payer: Self-pay | Admitting: Psychiatry

## 2019-09-15 ENCOUNTER — Other Ambulatory Visit: Payer: Self-pay | Admitting: Physician Assistant

## 2019-09-15 DIAGNOSIS — M533 Sacrococcygeal disorders, not elsewhere classified: Secondary | ICD-10-CM

## 2019-09-15 DIAGNOSIS — F330 Major depressive disorder, recurrent, mild: Secondary | ICD-10-CM

## 2019-09-15 DIAGNOSIS — F3342 Major depressive disorder, recurrent, in full remission: Secondary | ICD-10-CM

## 2019-09-15 NOTE — Telephone Encounter (Signed)
L.O.V. was on 09/10/2019.

## 2019-09-15 NOTE — Telephone Encounter (Signed)
Requested medication (s) are due for refill today:no  Requested medication (s) are on the active medication list: yes  Last refill: 12/27/2018  Future visit scheduled: no  Notes to clinic:  looks like medications was d/c per patient    Requested Prescriptions  Pending Prescriptions Disp Refills   celecoxib (CELEBREX) 100 MG capsule [Pharmacy Med Name: CELECOXIB CAPS 100MG ] 90 capsule 3    Sig: TAKE 1 CAPSULE DAILY      Analgesics:  COX2 Inhibitors Failed - 09/15/2019  9:13 AM      Failed - HGB in normal range and within 360 days    Hemoglobin  Date Value Ref Range Status  03/19/2019 10.0 (L) 12.0 - 15.0 g/dL Final  11/24/2014 12.6 11.1 - 15.9 g/dL Final          Failed - Cr in normal range and within 360 days    Creatinine  Date Value Ref Range Status  12/08/2018 1.5 (A) 0.5 - 1.1 Final   Creatinine, Ser  Date Value Ref Range Status  11/24/2014 1.23 (H) 0.57 - 1.00 mg/dL Final          Passed - Patient is not pregnant      Passed - Valid encounter within last 12 months    Recent Outpatient Visits           5 days ago [facility], Dionne Bucy, MD   11 months ago [place], [place], [place]   1 year ago Venous stasis dermatitis of left lower extremity   [place], [place]   1 year ago COPD exacerbation [facility])   [place], [place]   1 year ago COPD exacerbation [facility])   [place], [place], [place]

## 2019-09-20 ENCOUNTER — Encounter: Payer: Self-pay | Admitting: Psychiatry

## 2019-09-20 ENCOUNTER — Telehealth (INDEPENDENT_AMBULATORY_CARE_PROVIDER_SITE_OTHER): Payer: Medicare Other | Admitting: Psychiatry

## 2019-09-20 ENCOUNTER — Other Ambulatory Visit: Payer: Self-pay

## 2019-09-20 DIAGNOSIS — F424 Excoriation (skin-picking) disorder: Secondary | ICD-10-CM

## 2019-09-20 DIAGNOSIS — F5105 Insomnia due to other mental disorder: Secondary | ICD-10-CM

## 2019-09-20 DIAGNOSIS — F330 Major depressive disorder, recurrent, mild: Secondary | ICD-10-CM | POA: Diagnosis not present

## 2019-09-20 MED ORDER — BUPROPION HCL ER (SR) 150 MG PO TB12: 150.0000 mg | ORAL_TABLET | Freq: Every day | ORAL | 0 refills | Status: DC

## 2019-09-20 NOTE — Patient Instructions (Signed)
Please increase wellbutrin to wellbutrin SR 150 MG po daily.

## 2019-09-20 NOTE — Progress Notes (Signed)
Provider Location : [facility] Patient Location : [facility]  Participants: Patient ,Staff [name], Provider  Virtual Visit via Telephone Note  I connected with Sandra Brown on 09/20/19 at 10:00 AM EDT by telephone and verified that I am speaking with the correct person using two identifiers.   I discussed the limitations, risks, security and privacy concerns of performing an evaluation and management service by telephone and the availability of in person appointments. I also discussed with the patient that there may be a patient responsible charge related to this service. The patient expressed understanding and agreed to proceed.    I discussed the assessment and treatment plan with the patient. The patient was provided an opportunity to ask questions and all were answered. The patient agreed with the plan and demonstrated an understanding of the instructions.   The patient was advised to call back or seek an in-person evaluation if the symptoms worsen or if the condition fails to improve as anticipated.   [place] MD OP Progress Note  09/20/2019 10:45 AM Sandra Brown  MRN:  [id number]  Chief Complaint:  Chief Complaint    Follow-up     HPI: Sandra Brown is a 86 year old Caucasian female, lives [facility], has a history of MDD, insomnia, skin picking disorder, COPD, hypothyroidism, hypertension, cognitive disorder was evaluated by phone today.  Patient preferred to do a phone call.  Staff at ALF was also present in evaluation to assist patient today.  Per Sandra Brown-patient is currently doing okay.  She is compliant on medications.  She does continue to not have a good sleep hygiene.  There is not a lot of activities to do at this time due to the COVID-19 pandemic which does make her more depressed.  Her skin picking has improved some.  She was unable to continue her psychotherapy sessions since she was having trouble with the virtual appointments with her counselor.   She is compliant on medications.  Patient today reports she is having depressive symptoms due to the current isolation.  She has not been able to see her son in over a year and that does make her sad.  She also reports she is depressed about all the restrictions that are still going on.  She reports sleep overall is okay however she goes to bed very late at around 2 AM.  She however stays in bed till 9:30 AM-10 AM and gets around 7 to 8 hours of solid sleep which is good.  She does continue to struggle with lack of motivation and energy during the day.  She reports appetite is fair.  She denies any suicidality, homicidality or perceptual disturbances.    Visit Diagnosis:    ICD-10-CM   1. MDD (major depressive disorder), recurrent episode, mild (HCC)  F33.0 buPROPion (WELLBUTRIN SR) 150 MG 12 hr tablet  2. Insomnia due to mental condition  F51.05   3. Skin-picking disorder  F42.4     Past Psychiatric History: I have reviewed past psychiatric history from my progress note on 03/21/2017  Past Medical History:  Past Medical History:  Diagnosis Date  . Cataract   . COPD (chronic obstructive pulmonary disease) ([place])   . Depression   . Difficulty swallowing   . Frequent headaches   . Hearing loss   . Hypertension   . Hypothyroidism   . Reflux     Past Surgical History:  Procedure Laterality Date  . ABDOMINAL HYSTERECTOMY    . APPENDECTOMY    .  LUMBAR LAMINECTOMY    . PARATHYROIDECTOMY    . TOTAL HIP ARTHROPLASTY     x 4    Family Psychiatric History: I have reviewed family psychiatric history from my progress note on 03/21/2017.  Family History:  Family History  Problem Relation Age of Onset  . Stroke Mother   . Hypertension Mother   . Heart disease Father   . Hypertension Father     Social History: I have reviewed social history from my progress note on 03/21/2017. Social History   Socioeconomic History  . Marital status: Widowed    Spouse name: Not on file  .  Number of children: Not on file  . Years of education: Not on file  . Highest education level: Not on file  Occupational History  . Not on file  Tobacco Use  . Smoking status: Never Smoker  . Smokeless tobacco: Never Used  . Tobacco comment: quit 1954  Vaping Use  . Vaping Use: Never used  Substance and Sexual Activity  . Alcohol use: No  . Drug use: No  . Sexual activity: Not Currently  Other Topics Concern  . Not on file  Social History Narrative  . Not on file   Social Determinants of Health   Financial Resource Strain:   . Difficulty of Paying Living Expenses: Not on file  Food Insecurity:   . Worried About Charity fundraiser in the Last Year: Not on file  . Ran Out of Food in the Last Year: Not on file  Transportation Needs:   . Lack of Transportation (Medical): Not on file  . Lack of Transportation (Non-Medical): Not on file  Physical Activity:   . Days of Exercise per Week: Not on file  . Minutes of Exercise per Session: Not on file  Stress:   . Feeling of Stress : Not on file  Social Connections:   . Frequency of Communication with Friends and Family: Not on file  . Frequency of Social Gatherings with Friends and Family: Not on file  . Attends Religious Services: Not on file  . Active Member of Clubs or Organizations: Not on file  . Attends Archivist Meetings: Not on file  . Marital Status: Not on file    Allergies:  Allergies  Allergen Reactions  . Sulfa Antibiotics Rash and Itching    Other reaction(s): Diarrhea and vomiting (finding)  . Erythromycin Nausea And Vomiting    Other reaction(s): Diarrhea and vomiting (finding)  . Aspirin Other (See Comments), Tinitus and Nausea And Vomiting    Ringing of the ears, caused hearing loss both ears Ringing in ears  . Contrast Media [Iodinated Diagnostic Agents] Rash and Hives  . Latex Rash and Itching  . Penicillin G Rash  . Penicillins Rash    Other reaction(s): UNKNOWN  . Tape Rash    Other  reaction(s): UNKNOWN Adhesive Other reaction(s): UNKNOWN Adhesive    Metabolic Disorder Labs: No results found for: HGBA1C, MPG No results found for: PROLACTIN No results found for: CHOL, TRIG, HDL, CHOLHDL, VLDL, LDLCALC Lab Results  Component Value Date   TSH 2.47 12/08/2018   TSH 0.96 10/15/2018    Therapeutic Level Labs: No results found for: LITHIUM No results found for: VALPROATE No components found for:  CBMZ  Current Medications: Current Outpatient Medications  Medication Sig Dispense Refill  . acetaminophen (TYLENOL) 500 MG tablet Take 1 tablet (500 mg total) by mouth every 6 (six) hours as needed for moderate pain. Also give  500 mg three times a day for 7  Days. 60 tablet 0  . albuterol (VENTOLIN HFA) 108 (90 Base) MCG/ACT inhaler Inhale 2 puffs into the lungs every 6 (six) hours as needed for wheezing or shortness of breath. 8 g 2  . ALPRAZolam (XANAX) 0.25 MG tablet Take 0.25 mg by mouth at bedtime as needed for anxiety.    . AMBULATORY NON FORMULARY MEDICATION Medication Name: incentive spirometry Use as directed 1 each 0  . bismuth subsalicylate (PEPTO BISMOL) 262 MG/15ML suspension Take 30 mLs by mouth as needed.    Marland Kitchen buPROPion (WELLBUTRIN SR) 150 MG 12 hr tablet Take 1 tablet (150 mg total) by mouth daily. 90 tablet 0  . CALCIUM CITRATE PO Take 1 tablet by mouth in the morning and at bedtime.     . carbamide peroxide (DEBROX) 6.5 % OTIC solution Place 5 drops into both ears as needed.    . celecoxib (CELEBREX) 200 MG capsule Take 200 mg by mouth daily.    . cetirizine (ZYRTEC ALLERGY) 10 MG tablet Take 10 mg by mouth as needed for allergies.    . cholestyramine (QUESTRAN) 4 g packet ADD 1 PACKET TO 8OZ OF FLUID AND DRINK 1-2 TIMES A DAY AS NEEDED LOOSE STOOLS 180 each 1  . Cyanocobalamin (VITAMIN B12) 500 MCG TABS Take by mouth.    . diphenoxylate-atropine (LOMOTIL) 2.5-0.025 MG tablet Take by mouth. Take 1 tablet by mouth 2 (two) times daily as needed for  Diarrhea As need abdominal pain/diarrhea.    . fluticasone (FLONASE) 50 MCG/ACT nasal spray USE 2 SPRAYS NASALLY DAILY 48 g 2  . furosemide (LASIX) 40 MG tablet TAKE 1 TABLET DAILY 90 tablet 4  . guaifenesin (HUMIBID E) 400 MG TABS tablet Take 400 mg by mouth in the morning and at bedtime.    . hydrocortisone 1 % lotion Apply 1 application topically as needed for itching.    . hydrOXYzine (ATARAX/VISTARIL) 25 MG tablet Take 0.5-1 tablets (12.5-25 mg total) by mouth daily as needed. For skin picking, itching and anxiety attacks 90 tablet 1  . levocetirizine (XYZAL) 5 MG tablet TAKE 1 TABLET AT BEDTIME EVERY NIGHT 90 tablet 3  . levothyroxine (SYNTHROID, LEVOTHROID) 50 MCG tablet TAKE 1 TABLET DAILY 90 tablet 4  . lidocaine (LIDODERM) 5 % Place 1 patch onto the skin as needed. Remove & Discard patch within 12 hours or as directed by MD    . loperamide (IMODIUM) 2 MG capsule Take 2 mg by mouth as needed for diarrhea or loose stools.    . magnesium hydroxide (MILK OF MAGNESIA) 400 MG/5ML suspension Take by mouth daily as needed for mild constipation. 2 tbsp    . magnesium oxide (MAG-OX) 400 MG tablet Take 400 mg by mouth daily.    . meclizine (ANTIVERT) 12.5 MG tablet Take 12.5 mg by mouth every 4 (four) hours as needed for dizziness.    . melatonin 5 MG TABS Take 5 mg by mouth at bedtime.    . Mouthwashes (MOUTHWASH/GARGLE MT) Use as directed in the mouth or throat. 1 tsp by mouth as needed for sore mouth and tongue    . Multiple Vitamin (MULTIVITAMIN) tablet Take 1 tablet by mouth daily.    Marland Kitchen nystatin (MYCOSTATIN) 100000 UNIT/ML suspension Take 5 mLs (500,000 Units total) by mouth 4 (four) times daily. Swish and swallow 60 mL 0  . nystatin (MYCOSTATIN/NYSTOP) powder Apply 1 application topically 2 (two) times daily as needed.    . ondansetron (  ZOFRAN) 4 MG tablet Take 4 mg by mouth as needed for nausea or vomiting.    . OXYGEN Inhale 2 L into the lungs continuous as needed.    . pantoprazole  (PROTONIX) 20 MG tablet Take 1 tablet (20 mg total) by mouth daily. 90 tablet 1  . Phenylephrine-DM-GG-APAP (DELSYM COUGH/COLD DAYTIME) 5-10-200-325 MG/10ML LIQD Take 5 mLs by mouth as needed.    . Probiotic Product (PROBIOTIC COLON SUPPORT PO) Take 1 capsule by mouth daily.    . simvastatin (ZOCOR) 20 MG tablet TAKE 1 TABLET DAILY 90 tablet 4  . spironolactone (ALDACTONE) 25 MG tablet Take 1 tablet (25 mg total) by mouth daily. 90 tablet 0  . Suvorexant (BELSOMRA) 10 MG TABS Take 10 mg by mouth at bedtime. 90 tablet 1  . tamsulosin (FLOMAX) 0.4 MG CAPS capsule TAKE 1 CAPSULE DAILY 90 capsule 4  . tiotropium (SPIRIVA HANDIHALER) 18 MCG inhalation capsule Place 1 capsule (18 mcg total) into inhaler and inhale daily. 90 capsule 3  . venlafaxine XR (EFFEXOR-XR) 75 MG 24 hr capsule TAKE 1 CAPSULE DAILY WITH BREAKFAST 90 capsule 3   No current facility-administered medications for this visit.     Musculoskeletal: Strength & Muscle Tone: UTA Gait & Station: UTA Patient leans: N/A  Psychiatric Specialty Exam: Review of Systems  Psychiatric/Behavioral: Positive for dysphoric mood.  All other systems reviewed and are negative.   There were no vitals taken for this visit.There is no height or weight on file to calculate BMI.  General Appearance: UTA  Eye Contact:  UTA  Speech:  Clear and Coherent  Volume:  Normal  Mood:  Depressed  Affect:  UTA  Thought Process:  Goal Directed and Descriptions of Associations: Intact  Orientation:  Other:  Person, place and situation  Thought Content: Logical   Suicidal Thoughts:  No  Homicidal Thoughts:  No  Memory:  Immediate;   Fair Recent;   Fair Remote;   Limited  Judgement:  Fair  Insight:  Fair  Psychomotor Activity:  UTA  Concentration:  Concentration: Fair and Attention Span: Fair  Recall:  [employer] of Knowledge: Fair  Language: Fair  Akathisia:  No  Handed:  Right  AIMS (if indicated): UTA  Assets:  Communication Skills Desire for  Improvement Housing  ADL's:  Intact  Cognition: WNL  Sleep:  Fair   Screenings: PHQ2-9     Procedure visit from 09/04/2015 in [facility] Procedure visit from 10/03/2014 in [facility] Visit from 09/21/2014 in [facility]  PHQ-2 Total Score 0 0 1       Assessment and Plan: Sandra Brown is a 86 year old Caucasian female who has a history of depression, COPD, hypothyroidism, hypertension, renal function abnormalities was evaluated by phone today.  Patient is currently struggling with depressive symptoms and will benefit from medication readjustment.  Plan as noted below.  Plan MDD-mild-unstable Increase Wellbutrin to Wellbutrin SR 150 mg p.o. daily. Venlafaxine extended release 75 mg p.o. daily  Skin picking disorder-improving Patient was referred for CBT however she has been noncompliant due to her trouble with correction through telemedicine services. Advised to find a therapist in the community if she continues to need help.  Insomnia-improving Patient reports she goes to bed late at around 2 AM however sleeps 7 to 8 hours. Discussed to continue to work on sleep hygiene to have an earlier bedtime. Continue Belsomra 10 mg  p.o. nightly I have reviewed [place] controlled substance database.  I have obtained collateral information from her nurse-Sandra Brown who was also present in session today as summarized above.  Follow-up in clinic in 6 weeks or sooner if needed.  I have spent atleast 20 minutes non face to face with patient today. More than 50 % of the time was spent for preparing to see the patient ( e.g., review of test, records ), obtaining and to review and separately obtained history , ordering medications and test ,psychoeducation and supportive psychotherapy and care coordination,as well as documenting clinical information in electronic health  record. This note was generated in part or whole with voice recognition software. Voice recognition is usually quite accurate but there are transcription errors that can and very often do occur. I apologize for any typographical errors that were not detected and corrected.        Ursula Alert, MD 09/20/2019, 10:45 AM

## 2019-10-06 ENCOUNTER — Other Ambulatory Visit: Payer: Self-pay

## 2019-10-06 ENCOUNTER — Encounter: Payer: Self-pay | Admitting: Physician Assistant

## 2019-10-06 ENCOUNTER — Telehealth: Payer: Self-pay

## 2019-10-06 ENCOUNTER — Ambulatory Visit (INDEPENDENT_AMBULATORY_CARE_PROVIDER_SITE_OTHER): Payer: Medicare Other | Admitting: Physician Assistant

## 2019-10-06 ENCOUNTER — Telehealth: Payer: Self-pay | Admitting: Physician Assistant

## 2019-10-06 VITALS — BP 108/58 | HR 95 | Temp 98.4°F | Ht 60.0 in | Wt 117.2 lb

## 2019-10-06 DIAGNOSIS — D509 Iron deficiency anemia, unspecified: Secondary | ICD-10-CM

## 2019-10-06 DIAGNOSIS — K219 Gastro-esophageal reflux disease without esophagitis: Secondary | ICD-10-CM | POA: Diagnosis not present

## 2019-10-06 DIAGNOSIS — B370 Candidal stomatitis: Secondary | ICD-10-CM | POA: Diagnosis not present

## 2019-10-06 DIAGNOSIS — K5900 Constipation, unspecified: Secondary | ICD-10-CM

## 2019-10-06 DIAGNOSIS — N189 Chronic kidney disease, unspecified: Secondary | ICD-10-CM

## 2019-10-06 DIAGNOSIS — E039 Hypothyroidism, unspecified: Secondary | ICD-10-CM

## 2019-10-06 DIAGNOSIS — M25512 Pain in left shoulder: Secondary | ICD-10-CM

## 2019-10-06 DIAGNOSIS — J449 Chronic obstructive pulmonary disease, unspecified: Secondary | ICD-10-CM

## 2019-10-06 DIAGNOSIS — G8929 Other chronic pain: Secondary | ICD-10-CM

## 2019-10-06 MED ORDER — LIDOCAINE 5 % EX PTCH: 1.0000 | MEDICATED_PATCH | CUTANEOUS | 1 refills | Status: DC

## 2019-10-06 MED ORDER — PANTOPRAZOLE SODIUM 20 MG PO TBEC: 20.0000 mg | DELAYED_RELEASE_TABLET | Freq: Every day | ORAL | 1 refills | Status: DC

## 2019-10-06 NOTE — Telephone Encounter (Signed)
Please review. Thanks!  

## 2019-10-06 NOTE — Telephone Encounter (Signed)
5 mL TID swish and spit, thanks.

## 2019-10-06 NOTE — Telephone Encounter (Signed)
Don't forget to call [name] pts caregiver. Thanks!

## 2019-10-06 NOTE — Telephone Encounter (Signed)
Pharmacy needs clarification for magic mouth was directions / please advise   Swish and spit 50ML /stated seems a bit excessive

## 2019-10-06 NOTE — Progress Notes (Signed)
Established patient visit   [name]   DOB: Jul 02, 1926   86 y.o. Female  MRN: [id number] Visit Date: 10/06/2019  Today's healthcare provider: Trinna Post, PA-C   No chief complaint on file.  Subjective    Shoulder Pain  The pain is present in the left shoulder. This is a recurrent problem. The current episode started more than 1 year ago. There has been no history of extremity trauma. The problem occurs 2 to 4 times per day. The problem has been unchanged. The quality of the pain is described as aching. The pain is at a severity of 7/10. The pain is moderate. Associated symptoms include a limited range of motion. Pertinent negatives include no inability to bear weight, joint locking, joint swelling, numbness, stiffness or tingling. The symptoms are aggravated by activity and lying down. She has tried acetaminophen and heat for the symptoms. The treatment provided mild relief.    Acid Reflux: She reports this is a chronic issue, has seen a GI doctor for it remotely. She was told to avoid trigger foods. She is not taking Celebrex, to the best of her knowledge. She is not taking pantoprazole to the best of her knowledge, though this was given to her at one point.  Constipation: She has not had a bowel movement in 5 days. She would like to have one every 2 days. She was prescribed a medication for constipation but is unsure of what it was. Was prescribed miralax remotely and unable to remember if this was effective.  COPD: Currently taking spiriva for this which she feels manages her symptoms well. However, does report significant mucous production in the morning.   Hypothyroid, follow-up  Lab Results  Component Value Date   TSH 2.47 12/08/2018   TSH 0.96 10/15/2018   Wt Readings from Last 3 Encounters:  10/06/19 117 lb 3.2 oz (53.2 kg)  04/13/19 116 lb (52.6 kg)  04/11/19 117 lb (53.1 kg)    She was last seen for hypothyroid 1 years ago.  Management since  that visit includes continue synthroid. She reports excellent compliance with treatment. She is not having side effects.   Symptoms: No change in energy level Yes constipation  No diarrhea No heat / cold intolerance  No nervousness No palpitations  No weight changes    She would like to get her flu shot at [facility].  She also reports she has thrush. This was treated with Nystatin mouth wash that was sent in a month ago. She reports this did not work as well as [facility] magic mouthwash did previously. -----------------------------------------------------------------------------------------        Medications: Outpatient Medications Prior to Visit  Medication Sig  . acetaminophen (TYLENOL) 500 MG tablet Take 1 tablet (500 mg total) by mouth every 6 (six) hours as needed for moderate pain. Also give 500 mg three times a day for 7  Days.  Marland Kitchen albuterol (VENTOLIN HFA) 108 (90 Base) MCG/ACT inhaler Inhale 2 puffs into the lungs every 6 (six) hours as needed for wheezing or shortness of breath.  . ALPRAZolam (XANAX) 0.25 MG tablet Take 0.25 mg by mouth at bedtime as needed for anxiety.  . AMBULATORY NON FORMULARY MEDICATION Medication Name: incentive spirometry Use as directed  . bismuth subsalicylate (PEPTO BISMOL) 262 MG/15ML suspension Take 30 mLs by mouth as needed.  Marland Kitchen buPROPion (WELLBUTRIN SR) 150 MG 12 hr tablet Take 1 tablet (150 mg total) by mouth daily.  Marland Kitchen CALCIUM CITRATE PO  Take 1 tablet by mouth in the morning and at bedtime.   . carbamide peroxide (DEBROX) 6.5 % OTIC solution Place 5 drops into both ears as needed.  . celecoxib (CELEBREX) 200 MG capsule Take 200 mg by mouth daily.  . cetirizine (ZYRTEC ALLERGY) 10 MG tablet Take 10 mg by mouth as needed for allergies.  . cholestyramine (QUESTRAN) 4 g packet ADD 1 PACKET TO 8OZ OF FLUID AND DRINK 1-2 TIMES A DAY AS NEEDED LOOSE STOOLS  . Cyanocobalamin (VITAMIN B12) 500 MCG TABS Take by mouth.  . diphenoxylate-atropine (LOMOTIL)  2.5-0.025 MG tablet Take by mouth. Take 1 tablet by mouth 2 (two) times daily as needed for Diarrhea As need abdominal pain/diarrhea.  . fluticasone (FLONASE) 50 MCG/ACT nasal spray USE 2 SPRAYS NASALLY DAILY  . furosemide (LASIX) 40 MG tablet TAKE 1 TABLET DAILY  . guaifenesin (HUMIBID E) 400 MG TABS tablet Take 400 mg by mouth in the morning and at bedtime.  . hydrocortisone 1 % lotion Apply 1 application topically as needed for itching.  . hydrOXYzine (ATARAX/VISTARIL) 25 MG tablet Take 0.5-1 tablets (12.5-25 mg total) by mouth daily as needed. For skin picking, itching and anxiety attacks  . levocetirizine (XYZAL) 5 MG tablet TAKE 1 TABLET AT BEDTIME EVERY NIGHT  . levothyroxine (SYNTHROID, LEVOTHROID) 50 MCG tablet TAKE 1 TABLET DAILY  . lidocaine (LIDODERM) 5 % Place 1 patch onto the skin as needed. Remove & Discard patch within 12 hours or as directed by MD  . loperamide (IMODIUM) 2 MG capsule Take 2 mg by mouth as needed for diarrhea or loose stools.  . magnesium hydroxide (MILK OF MAGNESIA) 400 MG/5ML suspension Take by mouth daily as needed for mild constipation. 2 tbsp  . magnesium oxide (MAG-OX) 400 MG tablet Take 400 mg by mouth daily.  . meclizine (ANTIVERT) 12.5 MG tablet Take 12.5 mg by mouth every 4 (four) hours as needed for dizziness.  . melatonin 5 MG TABS Take 5 mg by mouth at bedtime.  . Mouthwashes (MOUTHWASH/GARGLE MT) Use as directed in the mouth or throat. 1 tsp by mouth as needed for sore mouth and tongue  . Multiple Vitamin (MULTIVITAMIN) tablet Take 1 tablet by mouth daily.  Marland Kitchen nystatin (MYCOSTATIN) 100000 UNIT/ML suspension Take 5 mLs (500,000 Units total) by mouth 4 (four) times daily. Swish and swallow  . nystatin (MYCOSTATIN/NYSTOP) powder Apply 1 application topically 2 (two) times daily as needed.  . ondansetron (ZOFRAN) 4 MG tablet Take 4 mg by mouth as needed for nausea or vomiting.  . OXYGEN Inhale 2 L into the lungs continuous as needed.  . pantoprazole  (PROTONIX) 20 MG tablet Take 1 tablet (20 mg total) by mouth daily.  Marland Kitchen Phenylephrine-DM-GG-APAP (DELSYM COUGH/COLD DAYTIME) 5-10-200-325 MG/10ML LIQD Take 5 mLs by mouth as needed.  . Probiotic Product (PROBIOTIC COLON SUPPORT PO) Take 1 capsule by mouth daily.  . simvastatin (ZOCOR) 20 MG tablet TAKE 1 TABLET DAILY  . spironolactone (ALDACTONE) 25 MG tablet Take 1 tablet (25 mg total) by mouth daily.  . Suvorexant (BELSOMRA) 10 MG TABS Take 10 mg by mouth at bedtime.  . tamsulosin (FLOMAX) 0.4 MG CAPS capsule TAKE 1 CAPSULE DAILY  . tiotropium (SPIRIVA HANDIHALER) 18 MCG inhalation capsule Place 1 capsule (18 mcg total) into inhaler and inhale daily.  Marland Kitchen venlafaxine XR (EFFEXOR-XR) 75 MG 24 hr capsule TAKE 1 CAPSULE DAILY WITH BREAKFAST   No facility-administered medications prior to visit.    Review of Systems  Musculoskeletal: Negative  for stiffness.  Neurological: Negative for tingling and numbness.      Objective    BP (!) 108/58 (BP Location: Right Arm, Patient Position: Sitting, Cuff Size: Large)   Pulse 95   Temp 98.4 F (36.9 C) (Oral)   Ht 5' (1.524 m)   Wt 117 lb 3.2 oz (53.2 kg)   SpO2 96%   BMI 22.89 kg/m    Physical Exam Constitutional:      Appearance: Normal appearance. She is normal weight.  HENT:     Mouth/Throat:     Comments: White coating on tongue Cardiovascular:     Rate and Rhythm: Normal rate and regular rhythm.     Heart sounds: Normal heart sounds.  Pulmonary:     Effort: Pulmonary effort is normal.     Breath sounds: Normal breath sounds.  Abdominal:     General: Bowel sounds are normal.     Palpations: Abdomen is soft.  Skin:    General: Skin is warm and dry.  Neurological:     General: No focal deficit present.     Mental Status: She is alert and oriented to person, place, and time.  Psychiatric:        Mood and Affect: Mood normal.        Behavior: Behavior normal.       No results found for any visits on 10/06/19.  Assessment  & Plan    1. Gastroesophageal reflux disease, unspecified whether esophagitis present  - pantoprazole (PROTONIX) 20 MG tablet; Take 1 tablet (20 mg total) by mouth daily.  Dispense: 90 tablet; Refill: 1  2. Constipation, unspecified constipation type  Will call her nurse audrey to see what she has tried and suggest another medication. Will also check TSH.  3. Thrush  Called in [facility] magic mouthwash to [facility].   4. Chronic left shoulder pain  - lidocaine (LIDODERM) 5 %; Place 1 patch onto the skin daily. Remove & Discard patch within 12 hours or as directed by MD  Dispense: 90 patch; Refill: 1  5. Chronic kidney disease, unspecified CKD stage   6. Iron deficiency anemia, unspecified iron deficiency anemia type  - Comprehensive Metabolic Panel (CMET) - CBC with Differential - TSH  7. Hypothyroidism, unspecified type  Adjust synthroid pending labs.   - TSH  8. COPD  Continue spiriva  No follow-ups on file.      ITrinna Post, PA-C, have reviewed all documentation for this visit. The documentation on 10/06/19 for the exam, diagnosis, procedures, and orders are all accurate and complete.  The entirety of the information documented in the History of Present Illness, Review of Systems and Physical Exam were personally obtained by me. Portions of this information were initially documented by [facility] and reviewed by me for thoroughness and accuracy.     Paulene Floor  [facility] [phone] (phone) [phone] (fax)  [place]

## 2019-10-07 LAB — CBC WITH DIFFERENTIAL/PLATELET
Basophils Absolute: 0 10*3/uL (ref 0.0–0.2)
Basos: 1 %
EOS (ABSOLUTE): 0.2 10*3/uL (ref 0.0–0.4)
Eos: 4 %
Hematocrit: 33.4 % — ABNORMAL LOW (ref 34.0–46.6)
Hemoglobin: 10.9 g/dL — ABNORMAL LOW (ref 11.1–15.9)
Immature Grans (Abs): 0 10*3/uL (ref 0.0–0.1)
Immature Granulocytes: 0 %
Lymphocytes Absolute: 0.8 10*3/uL (ref 0.7–3.1)
Lymphs: 14 %
MCH: 30.5 pg (ref 26.6–33.0)
MCHC: 32.6 g/dL (ref 31.5–35.7)
MCV: 94 fL (ref 79–97)
Monocytes Absolute: 0.4 10*3/uL (ref 0.1–0.9)
Monocytes: 7 %
Neutrophils Absolute: 4.5 10*3/uL (ref 1.4–7.0)
Neutrophils: 74 %
Platelets: 356 10*3/uL (ref 150–450)
RBC: 3.57 x10E6/uL — ABNORMAL LOW (ref 3.77–5.28)
RDW: 12.5 % (ref 11.7–15.4)
WBC: 6.1 10*3/uL (ref 3.4–10.8)

## 2019-10-07 LAB — COMPREHENSIVE METABOLIC PANEL
ALT: 9 IU/L (ref 0–32)
AST: 21 IU/L (ref 0–40)
Albumin/Globulin Ratio: 2.2 (ref 1.2–2.2)
Albumin: 4.4 g/dL (ref 3.5–4.6)
Alkaline Phosphatase: 88 IU/L (ref 44–121)
BUN/Creatinine Ratio: 18 (ref 12–28)
BUN: 28 mg/dL (ref 10–36)
Bilirubin Total: 0.6 mg/dL (ref 0.0–1.2)
CO2: 22 mmol/L (ref 20–29)
Calcium: 9.4 mg/dL (ref 8.7–10.3)
Chloride: 102 mmol/L (ref 96–106)
Creatinine, Ser: 1.57 mg/dL — ABNORMAL HIGH (ref 0.57–1.00)
GFR calc Af Amer: 33 mL/min/{1.73_m2} — ABNORMAL LOW (ref >59)
GFR calc non Af Amer: 29 mL/min/{1.73_m2} — ABNORMAL LOW (ref >59)
Globulin, Total: 2 g/dL (ref 1.5–4.5)
Glucose: 98 mg/dL (ref 65–99)
Potassium: 4.1 mmol/L (ref 3.5–5.2)
Sodium: 140 mmol/L (ref 134–144)
Total Protein: 6.4 g/dL (ref 6.0–8.5)

## 2019-10-07 LAB — TSH: TSH: 1.52 u[IU]/mL (ref 0.450–4.500)

## 2019-10-07 NOTE — Telephone Encounter (Signed)
Please review. Thanks!  

## 2019-10-07 NOTE — Telephone Encounter (Signed)
Please call Ms. Sandra Brown at [facility]

## 2019-10-07 NOTE — Telephone Encounter (Signed)
Advised [place] pharmacist at [facility] and she states that she apply directions and fill medication for patient.

## 2019-10-08 MED ORDER — PANTOPRAZOLE SODIUM 20 MG PO TBEC: 20.0000 mg | DELAYED_RELEASE_TABLET | Freq: Two times a day (BID) | ORAL | 1 refills | Status: DC

## 2019-10-08 MED ORDER — SENNA 8.6 MG PO TABS: 1.0000 | ORAL_TABLET | Freq: Every day | ORAL | 1 refills | Status: AC

## 2019-10-08 MED ORDER — LIDOCAINE 4 % EX PTCH: MEDICATED_PATCH | CUTANEOUS | 1 refills | Status: DC

## 2019-10-08 NOTE — Telephone Encounter (Signed)
Spoke with Sandra Pucker, RN at [facility] who cares for Ms. Sandra Brown. We agreed on medication changes including protonix 20 mg BID from [facility] and Senna QHS + lidoderm 4% from [facility]. Spoke about celebrex which is now at 100 mg daily leading to further kidney decline and potentially a GI bleed. Patient's hemoglobin as improved 2 points from the beginning of this year. We were able to add an iron onto her blood panel and will await those results.  Can we call magic [facility]wash into [facility] for thrush? We had called it into [facility] but it is too costly. I will take however they compound it with nystatin, lidocaine, benadryl, and/or maalox and patient can take it 24mL TID swish and spit.   Thank you!

## 2019-10-08 NOTE — Telephone Encounter (Signed)
[name] RN from [facility] has spoken to patient and she will try tylenol arthritis in place of celebrex due to kidney function and anemia.

## 2019-10-11 ENCOUNTER — Telehealth: Payer: Self-pay | Admitting: Physician Assistant

## 2019-10-11 DIAGNOSIS — M1990 Unspecified osteoarthritis, unspecified site: Secondary | ICD-10-CM

## 2019-10-11 MED ORDER — ACETAMINOPHEN ER 650 MG PO TBCR: 650.0000 mg | EXTENDED_RELEASE_TABLET | Freq: Three times a day (TID) | ORAL | 1 refills | Status: AC

## 2019-10-11 NOTE — Telephone Encounter (Signed)
Can we call magic mouthwash into [facility]? It looks like they only have Rx for nystatin. Whatever combination they suggest is fine by me, instructions 5 mL TID swish and spit.

## 2019-10-11 NOTE — Telephone Encounter (Signed)
Called in Rx as below.  

## 2019-10-12 LAB — SPECIMEN STATUS REPORT

## 2019-10-12 LAB — IRON AND TIBC
Iron Saturation: 19 % (ref 15–55)
Iron: 56 ug/dL (ref 27–139)
Total Iron Binding Capacity: 293 ug/dL (ref 250–450)
UIBC: 237 ug/dL (ref 118–369)

## 2019-10-12 LAB — FERRITIN: Ferritin: 67 ng/mL (ref 15–150)

## 2019-10-26 DIAGNOSIS — Z23 Encounter for immunization: Secondary | ICD-10-CM | POA: Diagnosis not present

## 2019-11-08 ENCOUNTER — Other Ambulatory Visit: Payer: Self-pay | Admitting: Family Medicine

## 2019-11-08 DIAGNOSIS — J309 Allergic rhinitis, unspecified: Secondary | ICD-10-CM

## 2019-11-11 ENCOUNTER — Telehealth (INDEPENDENT_AMBULATORY_CARE_PROVIDER_SITE_OTHER): Payer: Medicare Other | Admitting: Psychiatry

## 2019-11-11 ENCOUNTER — Other Ambulatory Visit: Payer: Self-pay

## 2019-11-11 ENCOUNTER — Encounter: Payer: Self-pay | Admitting: Psychiatry

## 2019-11-11 ENCOUNTER — Telehealth: Payer: Self-pay

## 2019-11-11 DIAGNOSIS — F5105 Insomnia due to other mental disorder: Secondary | ICD-10-CM | POA: Diagnosis not present

## 2019-11-11 DIAGNOSIS — F3342 Major depressive disorder, recurrent, in full remission: Secondary | ICD-10-CM | POA: Insufficient documentation

## 2019-11-11 DIAGNOSIS — F424 Excoriation (skin-picking) disorder: Secondary | ICD-10-CM

## 2019-11-11 MED ORDER — BUPROPION HCL ER (SR) 150 MG PO TB12: 150.0000 mg | ORAL_TABLET | Freq: Every day | ORAL | 1 refills | Status: DC

## 2019-11-11 MED ORDER — BELSOMRA 15 MG PO TABS: 15.0000 mg | ORAL_TABLET | Freq: Every day | ORAL | 1 refills | Status: DC

## 2019-11-11 NOTE — Telephone Encounter (Signed)
letter was faxed and confirmed about start belsomra or suvorexant 15mg  by mouth at bedtime for sleep.

## 2019-11-11 NOTE — Progress Notes (Signed)
Virtual Visit via Telephone Note  I connected with Sandra Brown on 11/11/19 at 11:30 AM EDT by telephone and verified that I am speaking with the correct person using two identifiers.  Location Provider Location : [facility] Patient Location : [facility]  Participants: Patient ,Sandra Brown (staff), Provider    I discussed the limitations, risks, security and privacy concerns of performing an evaluation and management service by telephone and the availability of in person appointments. I also discussed with the patient that there may be a patient responsible charge related to this service. The patient expressed understanding and agreed to proceed.    I discussed the assessment and treatment plan with the patient. The patient was provided an opportunity to ask questions and all were answered. The patient agreed with the plan and demonstrated an understanding of the instructions.   The patient was advised to call back or seek an in-person evaluation if the symptoms worsen or if the condition fails to improve as anticipated.  [place] MD OP Progress Note  11/11/2019 12:00 PM MONEA PESANTEZ  MRN:  [id number]  Chief Complaint:  Chief Complaint    Follow-up     HPI: Sandra Brown is a 86 year old Caucasian female, lives [facility], has a history of MDD, skin picking disorder, COPD, hypothyroidism, hypertension, cognitive disorder was evaluated by phone today.  Patient being a limited historian staff-Ms. Audrey at [facility] assisted with the evaluation today.  According to Ms. Sandra Brown patient is currently doing well.  She does have skin picking however she does not have a lot of lesions at this time on her skin and it looks like it is getting better.  She currently does not have any significant mood symptoms and is doing okay.  She is compliant on medications and tolerating them well.  Patient today appeared to be alert, oriented.  She reports she is not depressed.  She does raise  some concern about feeling tired and sleepy during the day.  She feels she is not getting enough rest at night and is interested in increasing the Belsomra.  She however does have to work on her sleep hygiene and she is aware of this.  Patient denies any suicidality.  She did not express any homicidality or perceptual disturbances.  Patient denies any other concerns today.  Visit Diagnosis:    ICD-10-CM   1. MDD (major depressive disorder), recurrent, in full remission ([place])  F33.42 buPROPion (WELLBUTRIN SR) 150 MG 12 hr tablet    Suvorexant (BELSOMRA) 15 MG TABS  2. Insomnia due to mental condition  F51.05   3. Skin-picking disorder  F42.4     Past Psychiatric History: I have reviewed past psychiatric history from my progress note on 03/21/2017  Past Medical History:  Past Medical History:  Diagnosis Date  . Cataract   . COPD (chronic obstructive pulmonary disease) ([place])   . Depression   . Difficulty swallowing   . Frequent headaches   . Hearing loss   . Hypertension   . Hypothyroidism   . Reflux     Past Surgical History:  Procedure Laterality Date  . ABDOMINAL HYSTERECTOMY    . APPENDECTOMY    . LUMBAR LAMINECTOMY    . PARATHYROIDECTOMY    . TOTAL HIP ARTHROPLASTY     x 4    Family Psychiatric History: I have reviewed family psychiatric history from my progress note on 03/21/2017  Family History:  Family History  Problem Relation Age of Onset  .  Stroke Mother   . Hypertension Mother   . Heart disease Father   . Hypertension Father     Social History: I have reviewed social history from my progress note from 03/21/2017 Social History   Socioeconomic History  . Marital status: Widowed    Spouse name: Not on file  . Number of children: Not on file  . Years of education: Not on file  . Highest education level: Not on file  Occupational History  . Not on file  Tobacco Use  . Smoking status: Never Smoker  . Smokeless tobacco: Never Used  . Tobacco comment:  quit 1954  Vaping Use  . Vaping Use: Never used  Substance and Sexual Activity  . Alcohol use: No  . Drug use: No  . Sexual activity: Not Currently  Other Topics Concern  . Not on file  Social History Narrative  . Not on file   Social Determinants of Health   Financial Resource Strain:   . Difficulty of Paying Living Expenses: Not on file  Food Insecurity:   . Worried About Charity fundraiser in the Last Year: Not on file  . Ran Out of Food in the Last Year: Not on file  Transportation Needs:   . Lack of Transportation (Medical): Not on file  . Lack of Transportation (Non-Medical): Not on file  Physical Activity:   . Days of Exercise per Week: Not on file  . Minutes of Exercise per Session: Not on file  Stress:   . Feeling of Stress : Not on file  Social Connections:   . Frequency of Communication with Friends and Family: Not on file  . Frequency of Social Gatherings with Friends and Family: Not on file  . Attends Religious Services: Not on file  . Active Member of Clubs or Organizations: Not on file  . Attends Archivist Meetings: Not on file  . Marital Status: Not on file    Allergies:  Allergies  Allergen Reactions  . Sulfa Antibiotics Rash and Itching    Other reaction(s): Diarrhea and vomiting (finding)  . Erythromycin Nausea And Vomiting    Other reaction(s): Diarrhea and vomiting (finding)  . Aspirin Other (See Comments), Tinitus and Nausea And Vomiting    Ringing of the ears, caused hearing loss both ears Ringing in ears  . Contrast Media [Iodinated Diagnostic Agents] Rash and Hives  . Latex Rash and Itching  . Penicillin G Rash  . Penicillins Rash    Other reaction(s): UNKNOWN  . Tape Rash    Other reaction(s): UNKNOWN Adhesive Other reaction(s): UNKNOWN Adhesive    Metabolic Disorder Labs: No results found for: HGBA1C, MPG No results found for: PROLACTIN No results found for: CHOL, TRIG, HDL, CHOLHDL, VLDL, LDLCALC Lab Results   Component Value Date   TSH 1.520 10/06/2019   TSH 2.47 12/08/2018    Therapeutic Level Labs: No results found for: LITHIUM No results found for: VALPROATE No components found for:  CBMZ  Current Medications: Current Outpatient Medications  Medication Sig Dispense Refill  . levocetirizine (XYZAL) 5 MG tablet TAKE 1 TABLET AT BEDTIME EVERY NIGHT 90 tablet 3  . acetaminophen (TYLENOL 8 HOUR ARTHRITIS PAIN) 650 MG CR tablet Take 1 tablet (650 mg total) by mouth every 8 (eight) hours. 270 tablet 1  . albuterol (VENTOLIN HFA) 108 (90 Base) MCG/ACT inhaler Inhale 2 puffs into the lungs every 6 (six) hours as needed for wheezing or shortness of breath. 8 g 2  . ALPRAZolam (  XANAX) 0.25 MG tablet Take 0.25 mg by mouth at bedtime as needed for anxiety.    . AMBULATORY NON FORMULARY MEDICATION Medication Name: incentive spirometry Use as directed 1 each 0  . bismuth subsalicylate (PEPTO BISMOL) 262 MG/15ML suspension Take 30 mLs by mouth as needed.    Marland Kitchen buPROPion (WELLBUTRIN SR) 150 MG 12 hr tablet Take 1 tablet (150 mg total) by mouth daily. 90 tablet 1  . CALCIUM CITRATE PO Take 1 tablet by mouth in the morning and at bedtime.     . carbamide peroxide (DEBROX) 6.5 % OTIC solution Place 5 drops into both ears as needed.    . celecoxib (CELEBREX) 200 MG capsule Take 200 mg by mouth daily.    . cholestyramine (QUESTRAN) 4 g packet ADD 1 PACKET TO 8OZ OF FLUID AND DRINK 1-2 TIMES A DAY AS NEEDED LOOSE STOOLS 180 each 1  . Cyanocobalamin (VITAMIN B12) 500 MCG TABS Take by mouth.    . diphenoxylate-atropine (LOMOTIL) 2.5-0.025 MG tablet Take by mouth. Take 1 tablet by mouth 2 (two) times daily as needed for Diarrhea As need abdominal pain/diarrhea.    . fluticasone (FLONASE) 50 MCG/ACT nasal spray USE 2 SPRAYS NASALLY DAILY 48 g 2  . furosemide (LASIX) 40 MG tablet TAKE 1 TABLET DAILY 90 tablet 4  . guaifenesin (HUMIBID E) 400 MG TABS tablet Take 400 mg by mouth in the morning and at bedtime.    .  hydrocortisone 1 % lotion Apply 1 application topically as needed for itching.    . hydrOXYzine (ATARAX/VISTARIL) 25 MG tablet Take 0.5-1 tablets (12.5-25 mg total) by mouth daily as needed. For skin picking, itching and anxiety attacks 90 tablet 1  . levothyroxine (SYNTHROID, LEVOTHROID) 50 MCG tablet TAKE 1 TABLET DAILY 90 tablet 4  . Lidocaine 4 % PTCH Use one patch daily. 90 patch 1  . loperamide (IMODIUM) 2 MG capsule Take 2 mg by mouth as needed for diarrhea or loose stools.    . magnesium hydroxide (MILK OF MAGNESIA) 400 MG/5ML suspension Take by mouth daily as needed for mild constipation. 2 tbsp    . magnesium oxide (MAG-OX) 400 MG tablet Take 400 mg by mouth daily.    . meclizine (ANTIVERT) 12.5 MG tablet Take 12.5 mg by mouth every 4 (four) hours as needed for dizziness.    . melatonin 5 MG TABS Take 5 mg by mouth at bedtime.    . Mouthwashes (MOUTHWASH/GARGLE MT) Use as directed in the mouth or throat. 1 tsp by mouth as needed for sore mouth and tongue    . Multiple Vitamin (MULTIVITAMIN) tablet Take 1 tablet by mouth daily.    Marland Kitchen nystatin (MYCOSTATIN) 100000 UNIT/ML suspension Take 5 mLs (500,000 Units total) by mouth 4 (four) times daily. Swish and swallow 60 mL 0  . nystatin (MYCOSTATIN/NYSTOP) powder Apply 1 application topically 2 (two) times daily as needed.    . ondansetron (ZOFRAN) 4 MG tablet Take 4 mg by mouth as needed for nausea or vomiting.    . OXYGEN Inhale 2 L into the lungs continuous as needed.    . pantoprazole (PROTONIX) 20 MG tablet Take 1 tablet (20 mg total) by mouth 2 (two) times daily. 180 tablet 1  . Phenylephrine-DM-GG-APAP (DELSYM COUGH/COLD DAYTIME) 5-10-200-325 MG/10ML LIQD Take 5 mLs by mouth as needed.    . Probiotic Product (PROBIOTIC COLON SUPPORT PO) Take 1 capsule by mouth daily.    Marland Kitchen senna (SENOKOT) 8.6 MG TABS tablet Take 1 tablet (  8.6 mg total) by mouth at bedtime. 90 tablet 1  . simvastatin (ZOCOR) 20 MG tablet TAKE 1 TABLET DAILY 90 tablet 4  .  spironolactone (ALDACTONE) 25 MG tablet Take 1 tablet (25 mg total) by mouth daily. 90 tablet 0  . Suvorexant (BELSOMRA) 10 MG TABS Take 10 mg by mouth at bedtime. 90 tablet 1  . Suvorexant (BELSOMRA) 15 MG TABS Take 15 mg by mouth at bedtime. 90 tablet 1  . tamsulosin (FLOMAX) 0.4 MG CAPS capsule TAKE 1 CAPSULE DAILY 90 capsule 4  . tiotropium (SPIRIVA HANDIHALER) 18 MCG inhalation capsule Place 1 capsule (18 mcg total) into inhaler and inhale daily. 90 capsule 3  . venlafaxine XR (EFFEXOR-XR) 75 MG 24 hr capsule TAKE 1 CAPSULE DAILY WITH BREAKFAST 90 capsule 3   No current facility-administered medications for this visit.     Musculoskeletal: Strength & Muscle Tone: UTA Gait & Station: UTA Patient leans: N/A  Psychiatric Specialty Exam: Review of Systems  Psychiatric/Behavioral: Positive for sleep disturbance.       Skin picking - improving  All other systems reviewed and are negative.   There were no vitals taken for this visit.There is no height or weight on file to calculate BMI.  General Appearance: UTA  Eye Contact:  UTA  Speech:  Clear and Coherent  Volume:  Normal  Mood:  Euthymic  Affect:  UTA  Thought Process:  Goal Directed and Descriptions of Associations: Intact  Orientation:  Full (Time, Place, and Person)  Thought Content: Logical   Suicidal Thoughts:  No  Homicidal Thoughts:  No  Memory:  Immediate;   Fair Recent;   Fair Remote;   Fair  Judgement:  Fair  Insight:  Fair  Psychomotor Activity:  UTA  Concentration:  Concentration: Fair and Attention Span: Fair  Recall:  [employer] of Knowledge: Fair  Language: Fair  Akathisia:  No  Handed:  Right  AIMS (if indicated): UTA  Assets:  Communication Skills Desire for Improvement Housing Social Support  ADL's:  Intact  Cognition: WNL  Sleep:  Restless   Screenings: PHQ2-9     Office Visit from 10/06/2019 in [facility] Procedure visit from 09/04/2015 in [facility] Procedure visit from 10/03/2014 in [facility] Office Visit from 09/21/2014 in [facility]  PHQ-2 Total Score 2 0 0 1  PHQ-9 Total Score 4 -- -- --       Assessment and Plan: TAYLYN BRAME is a 86 year old Caucasian female who has a history of depression, COPD, hypothyroidism, hypertension, renal function abnormalities was evaluated by phone today.  Patient is currently making progress with regards to her depression however does struggle with sleep issues.  Discussed plan as noted below.  Plan MDD-improving Wellbutrin SR 150 mg daily Venlafaxine extended release 75 mg p.o. daily  Skin picking disorder-stable Will continue to monitor. Patient was referred for CBT however she has been noncompliant. Advised patient to keep her fingers busy doing other activities like knitting and to try behavioral techniques.  Insomnia-unstable Increase Belsomra to 15 mg p.o. nightly I have reviewed [place] controlled substance database.   Printed out a letter stating that patient had recent medication changes.  [facility].  I have obtained collateral information from her nurse-already as noted above.  Follow-up in clinic in 4 weeks or sooner if needed.  I have spent atleast 20 minutes  non face to face by video with patient today. More than 50 % of the time was spent for preparing to see the patient ( e.g., review of test, records ), obtaining and to review and separately obtained history . ordering medications and test ,psychoeducation and supportive psychotherapy and care coordination,as well as documenting clinical information in electronic health record. This note was generated in part or whole with voice recognition software. Voice recognition is usually quite accurate but there are transcription errors that can and very often do occur. I apologize for any typographical  errors that were not detected and corrected.        Ursula Alert, MD 11/12/2019, 8:03 AM

## 2019-11-19 DIAGNOSIS — Z23 Encounter for immunization: Secondary | ICD-10-CM | POA: Diagnosis not present

## 2019-11-23 LAB — HEPATIC FUNCTION PANEL
ALT: 13 (ref 7–35)
AST: 21 (ref 13–35)
Alkaline Phosphatase: 72 (ref 25–125)
Bilirubin, Total: 0.3

## 2019-11-23 LAB — BASIC METABOLIC PANEL
BUN: 27 — AB (ref 4–21)
CO2: 29 — AB (ref 13–22)
Chloride: 106 (ref 99–108)
Creatinine: 1.3 — AB (ref <=1.1)
Glucose: 81
Potassium: 4.2 (ref 3.4–5.3)
Sodium: 143 (ref 137–147)

## 2019-11-23 LAB — COMPREHENSIVE METABOLIC PANEL
Albumin: 3.9 (ref 3.5–5.0)
Calcium: 8.9 (ref 8.7–10.7)
GFR calc Af Amer: 41
GFR calc non Af Amer: 36
Globulin: 2.2

## 2019-11-23 LAB — TSH: TSH: 0.85 (ref <=5.90)

## 2019-11-23 LAB — CBC AND DIFFERENTIAL: WBC: 5.2

## 2019-11-26 ENCOUNTER — Telehealth: Payer: Self-pay | Admitting: Psychiatry

## 2019-11-26 NOTE — Telephone Encounter (Signed)
I have reviewed the following labs received-dated 11/22/2019- CMP-BUN-high at 27 Creatinine-elevated at 1.31 GFR non-African-American-36-low  TSH-within normal limits at 0.85  CBC with differential-within normal limits

## 2019-11-29 ENCOUNTER — Telehealth: Payer: Self-pay | Admitting: Physician Assistant

## 2019-11-29 NOTE — Telephone Encounter (Signed)
Hemoglobin is stable and kidney function is slightly improved. If she has decreased the celebrex would continue with the lower dose or none at all.

## 2019-12-06 ENCOUNTER — Telehealth: Payer: Self-pay | Admitting: Physician Assistant

## 2019-12-06 DIAGNOSIS — M25552 Pain in left hip: Secondary | ICD-10-CM

## 2019-12-06 NOTE — Telephone Encounter (Signed)
[name] RN - Director of Nursing at [facility] is calling to report that the patient fell on 11/30/19 & 12/01/19. [name] reports that there is a soft knot her left hip.  [name] thinks nothing is broken bc the patient has been walking on it. Request an order for a mobile order for an xray left hip. Please call if this is ok. CB- [phone]

## 2019-12-06 NOTE — Telephone Encounter (Addendum)
How did she fall? What is hurting? Is the xray going to come to her facility? Really should do at least a virtual office visit so we can assess.

## 2019-12-06 NOTE — Telephone Encounter (Signed)
Patient was advised. Patient states she would like a order for x-ray due to having 2 falls last week. Please advise.

## 2019-12-06 NOTE — Telephone Encounter (Signed)
Patient states she felled on her left side and it hurts when she stands up. She states that x-ray does come to the facility. Patient scheduled a virtual visit for 12/11/2019. Just a [place]

## 2019-12-07 NOTE — Telephone Encounter (Signed)
I ordered a left hip xray. If it has to be faxed, can print out and fax. May need to call RN for fax #.

## 2019-12-09 ENCOUNTER — Telehealth (INDEPENDENT_AMBULATORY_CARE_PROVIDER_SITE_OTHER): Payer: Medicare Other | Admitting: Physician Assistant

## 2019-12-09 DIAGNOSIS — M25551 Pain in right hip: Secondary | ICD-10-CM | POA: Diagnosis not present

## 2019-12-09 NOTE — Telephone Encounter (Signed)
[name] is calling and she is only getting the first page. Please refax and [name] just needs the order for xray the second page

## 2019-12-09 NOTE — Telephone Encounter (Signed)
Done. TNP 

## 2019-12-09 NOTE — Progress Notes (Signed)
MyChart Video Visit    Virtual Visit via Video Note   This visit type was conducted due to national recommendations for restrictions regarding the COVID-19 Pandemic (e.g. social distancing) in an effort to limit this patient's exposure and mitigate transmission in our community. This patient is at least at moderate risk for complications without adequate follow up. This format is felt to be most appropriate for this patient at this time. Physical exam was limited by quality of the video and audio technology used for the visit.   Patient location: Home Provider location: Office   I discussed the limitations of evaluation and management by telemedicine and the availability of in person appointments. The patient expressed understanding and agreed to proceed.  [name]   DOB: 1926-01-18   86 y.o. Female  MRN: [id number] Visit Date: 12/09/2019  Today's healthcare provider: Trinna Post, PA-C   Chief Complaint  Patient presents with  . Fall  I,Porsha C McClurkin,acting as a Education administrator for [employer], PA-C.,have documented all relevant documentation on the behalf of Trinna Post, PA-C,as directed by  Trinna Post, PA-C while in the presence of Trinna Post, PA-C.  Subjective    Fall The accident occurred 3 to 5 days ago. The fall occurred while standing. She landed on hard floor. The point of impact was the buttocks. The pain is at a severity of 0/10. The patient is experiencing no pain. The symptoms are aggravated by standing and sitting. Pertinent negatives include no numbness or tingling. She has tried nothing for the symptoms. The treatment provided no relief.    Last week patient fell into closet and tripped over dividing strip between kitchen about one week ago. [name] RN with [facility] reports patient is walking and completing PT. She is having right hip pain. RN reports there is a hematoma overlying the area. Reports history of R hip replacement.  Denies head injury of loss of consciousness.    Medications: Outpatient Medications Prior to Visit  Medication Sig  . acetaminophen (TYLENOL 8 HOUR ARTHRITIS PAIN) 650 MG CR tablet Take 1 tablet (650 mg total) by mouth every 8 (eight) hours.  Marland Kitchen albuterol (VENTOLIN HFA) 108 (90 Base) MCG/ACT inhaler Inhale 2 puffs into the lungs every 6 (six) hours as needed for wheezing or shortness of breath.  . ALPRAZolam (XANAX) 0.25 MG tablet Take 0.25 mg by mouth at bedtime as needed for anxiety.  . AMBULATORY NON FORMULARY MEDICATION Medication Name: incentive spirometry Use as directed  . bismuth subsalicylate (PEPTO BISMOL) 262 MG/15ML suspension Take 30 mLs by mouth as needed.  Marland Kitchen buPROPion (WELLBUTRIN SR) 150 MG 12 hr tablet Take 1 tablet (150 mg total) by mouth daily.  Marland Kitchen CALCIUM CITRATE PO Take 1 tablet by mouth in the morning and at bedtime.   . carbamide peroxide (DEBROX) 6.5 % OTIC solution Place 5 drops into both ears as needed.  . celecoxib (CELEBREX) 200 MG capsule Take 200 mg by mouth daily.  . cholestyramine (QUESTRAN) 4 g packet ADD 1 PACKET TO 8OZ OF FLUID AND DRINK 1-2 TIMES A DAY AS NEEDED LOOSE STOOLS  . Cyanocobalamin (VITAMIN B12) 500 MCG TABS Take by mouth.  . diphenoxylate-atropine (LOMOTIL) 2.5-0.025 MG tablet Take by mouth. Take 1 tablet by mouth 2 (two) times daily as needed for Diarrhea As need abdominal pain/diarrhea.  . fluticasone (FLONASE) 50 MCG/ACT nasal spray USE 2 SPRAYS NASALLY DAILY  . furosemide (LASIX) 40 MG tablet TAKE 1 TABLET  DAILY  . guaifenesin (HUMIBID E) 400 MG TABS tablet Take 400 mg by mouth in the morning and at bedtime.  . hydrocortisone 1 % lotion Apply 1 application topically as needed for itching.  . hydrOXYzine (ATARAX/VISTARIL) 25 MG tablet Take 0.5-1 tablets (12.5-25 mg total) by mouth daily as needed. For skin picking, itching and anxiety attacks  . levocetirizine (XYZAL) 5 MG tablet TAKE 1 TABLET AT BEDTIME EVERY NIGHT  . levothyroxine (SYNTHROID,  LEVOTHROID) 50 MCG tablet TAKE 1 TABLET DAILY  . Lidocaine 4 % PTCH Use one patch daily.  Marland Kitchen loperamide (IMODIUM) 2 MG capsule Take 2 mg by mouth as needed for diarrhea or loose stools.  . magnesium hydroxide (MILK OF MAGNESIA) 400 MG/5ML suspension Take by mouth daily as needed for mild constipation. 2 tbsp  . magnesium oxide (MAG-OX) 400 MG tablet Take 400 mg by mouth daily.  . meclizine (ANTIVERT) 12.5 MG tablet Take 12.5 mg by mouth every 4 (four) hours as needed for dizziness.  . melatonin 5 MG TABS Take 5 mg by mouth at bedtime.  . Mouthwashes (MOUTHWASH/GARGLE MT) Use as directed in the mouth or throat. 1 tsp by mouth as needed for sore mouth and tongue  . Multiple Vitamin (MULTIVITAMIN) tablet Take 1 tablet by mouth daily.  Marland Kitchen nystatin (MYCOSTATIN) 100000 UNIT/ML suspension Take 5 mLs (500,000 Units total) by mouth 4 (four) times daily. Swish and swallow  . nystatin (MYCOSTATIN/NYSTOP) powder Apply 1 application topically 2 (two) times daily as needed.  . ondansetron (ZOFRAN) 4 MG tablet Take 4 mg by mouth as needed for nausea or vomiting.  . OXYGEN Inhale 2 L into the lungs continuous as needed.  . pantoprazole (PROTONIX) 20 MG tablet Take 1 tablet (20 mg total) by mouth 2 (two) times daily.  Marland Kitchen Phenylephrine-DM-GG-APAP (DELSYM COUGH/COLD DAYTIME) 5-10-200-325 MG/10ML LIQD Take 5 mLs by mouth as needed.  . Probiotic Product (PROBIOTIC COLON SUPPORT PO) Take 1 capsule by mouth daily.  Marland Kitchen senna (SENOKOT) 8.6 MG TABS tablet Take 1 tablet (8.6 mg total) by mouth at bedtime.  . simvastatin (ZOCOR) 20 MG tablet TAKE 1 TABLET DAILY  . spironolactone (ALDACTONE) 25 MG tablet Take 1 tablet (25 mg total) by mouth daily.  . Suvorexant (BELSOMRA) 10 MG TABS Take 10 mg by mouth at bedtime.  . Suvorexant (BELSOMRA) 15 MG TABS Take 15 mg by mouth at bedtime.  . tamsulosin (FLOMAX) 0.4 MG CAPS capsule TAKE 1 CAPSULE DAILY  . tiotropium (SPIRIVA HANDIHALER) 18 MCG inhalation capsule Place 1 capsule (18 mcg  total) into inhaler and inhale daily.  Marland Kitchen venlafaxine XR (EFFEXOR-XR) 75 MG 24 hr capsule TAKE 1 CAPSULE DAILY WITH BREAKFAST   No facility-administered medications prior to visit.    Review of Systems  Neurological: Negative for tingling and numbness.      Objective    There were no vitals taken for this visit.   Physical Exam Constitutional:      Appearance: Normal appearance.  Pulmonary:     Effort: Pulmonary effort is normal. No respiratory distress.  Neurological:     Mental Status: She is alert.  Psychiatric:        Mood and Affect: Mood normal.        Behavior: Behavior normal.        Assessment & Plan    1. Right hip pain  Right hip xray order faxed to [facility] where a mobile unit will come to xray patient. Anticipate faxed results. May ice area PRN.  No follow-ups on file.     I discussed the assessment and treatment plan with the patient. The patient was provided an opportunity to ask questions and all were answered. The patient agreed with the plan and demonstrated an understanding of the instructions.   The patient was advised to call back or seek an in-person evaluation if the symptoms worsen or if the condition fails to improve as anticipated.   ITrinna Post, PA-C, have reviewed all documentation for this visit. The documentation on 12/09/19 for the exam, diagnosis, procedures, and orders are all accurate and complete.  The entirety of the information documented in the History of Present Illness, Review of Systems and Physical Exam were personally obtained by me. Portions of this information were initially documented by [facility] and reviewed by me for thoroughness and accuracy.     Paulene Floor [facility] [phone] (phone) [phone] (fax)  [place]

## 2019-12-10 ENCOUNTER — Telehealth: Payer: Self-pay | Admitting: Physician Assistant

## 2019-12-10 NOTE — Telephone Encounter (Signed)
Called patient and no answer or vm setup. If patient calls back okay for [facility] to advise.

## 2019-12-10 NOTE — Telephone Encounter (Signed)
Can we let patient know her left hip xray is negative for fracture.

## 2019-12-13 ENCOUNTER — Telehealth: Payer: Self-pay | Admitting: Psychiatry

## 2019-12-13 ENCOUNTER — Encounter: Payer: Self-pay | Admitting: Psychiatry

## 2019-12-13 NOTE — Telephone Encounter (Signed)
Patient was advised and states she will let [name] know as well.

## 2019-12-13 NOTE — Telephone Encounter (Signed)
Received request from daughter to stop Belsomra since patient is not responding. Will discontinue Belsomra for lack of benefit. Will fax order to [facility].  Will fax [facility] to [facility].

## 2019-12-29 ENCOUNTER — Telehealth: Payer: Self-pay

## 2019-12-29 NOTE — Telephone Encounter (Signed)
Form was completed and faxed back earlier today

## 2019-12-29 NOTE — Telephone Encounter (Signed)
Please review. Thanks!  

## 2019-12-29 NOTE — Telephone Encounter (Signed)
Copied from [place] [id number]. Topic: General - Inquiry >> Dec 29, 2019  3:48 PM Gillis Ends D wrote: Reason for CRM: She needs a order for a urinalysis and a CNS. Nurse has faxed over the request also. If you have any questions please call [phone] and ask for [name]. Please advise

## 2019-12-30 ENCOUNTER — Other Ambulatory Visit: Payer: Self-pay

## 2019-12-30 ENCOUNTER — Encounter: Payer: Self-pay | Admitting: Psychiatry

## 2019-12-30 ENCOUNTER — Telehealth (INDEPENDENT_AMBULATORY_CARE_PROVIDER_SITE_OTHER): Payer: Medicare Other | Admitting: Psychiatry

## 2019-12-30 DIAGNOSIS — R350 Frequency of micturition: Secondary | ICD-10-CM | POA: Diagnosis not present

## 2019-12-30 DIAGNOSIS — F424 Excoriation (skin-picking) disorder: Secondary | ICD-10-CM | POA: Diagnosis not present

## 2019-12-30 DIAGNOSIS — G4709 Other insomnia: Secondary | ICD-10-CM | POA: Diagnosis not present

## 2019-12-30 DIAGNOSIS — F3342 Major depressive disorder, recurrent, in full remission: Secondary | ICD-10-CM | POA: Diagnosis not present

## 2019-12-30 DIAGNOSIS — R300 Dysuria: Secondary | ICD-10-CM | POA: Diagnosis not present

## 2019-12-30 NOTE — Progress Notes (Signed)
Virtual Visit via Video Note  I connected with Sandra Brown on 12/30/19 at 11:30 AM EST by a video enabled telemedicine application and verified that I am speaking with the correct person using two identifiers.  Location Provider Location : [facility] Patient Location : Home  Participants: Patient ,Staff at ALF , Provider   I discussed the limitations of evaluation and management by telemedicine and the availability of in person appointments. The patient expressed understanding and agreed to proceed.   I discussed the assessment and treatment plan with the patient. The patient was provided an opportunity to ask questions and all were answered. The patient agreed with the plan and demonstrated an understanding of the instructions.   The patient was advised to call back or seek an in-person evaluation if the symptoms worsen or if the condition fails to improve as anticipated.  [facility] MD OP Progress Note  12/30/2019 6:20 PM Sandra Brown  MRN:  [id number]  Chief Complaint:  Chief Complaint    Follow-up     HPI: Sandra Brown is a 86 year old Caucasian female, lives [facility], has a history of MDD, skin picking disorder, COPD, hypothyroidism, hypertension, cognitive disorder was evaluated by telemedicine today.  Patient being a limited historian-staff at assisted living facility-provided collateral information.  Patient today appeared to be alert, oriented to person place and situation.  She was able to answer questions however appeared to have hearing trouble and hence most of the questions had to be repeated by the staff.  Patient today reports mood wise she is doing okay.  She looks forward to spending her Christmas day with her daughter.  Patient continues to struggle with a good sleep routine.  She reports she does not feel tired at the end of the day and hence stays up late.  She however reports when she goes to bed she has no difficulty falling  asleep.  She hence does not want to be on any sleep medications since she does not like them.  Patient agrees to work on her sleep hygiene.  She denies any suicidality, homicidality or perceptual disturbances.  She is compliant on medications and denies side effects.  She did have a fall recently and currently has back pain.  She however reports it is manageable with Tylenol.  She has upcoming appointment with her provider.  According to staff-patient does need to work on her sleep hygiene, otherwise is doing fine.  Visit Diagnosis:    ICD-10-CM   1. MDD (major depressive disorder), recurrent, in full remission (Sandra Brown)  F33.42   2. Other insomnia  G47.09    problem with sleep hygiene  3. Skin-picking disorder  F42.4     Past Psychiatric History: I have reviewed past psychiatric history from my progress note on 03/21/2017  Past Medical History:  Past Medical History:  Diagnosis Date  . Cataract   . COPD (chronic obstructive pulmonary disease) ([place])   . Depression   . Difficulty swallowing   . Frequent headaches   . Hearing loss   . Hypertension   . Hypothyroidism   . Reflux     Past Surgical History:  Procedure Laterality Date  . ABDOMINAL HYSTERECTOMY    . APPENDECTOMY    . LUMBAR LAMINECTOMY    . PARATHYROIDECTOMY    . TOTAL HIP ARTHROPLASTY     x 4    Family Psychiatric History: I have reviewed family psychiatric history from my progress note on 03/21/2017  Family History:  Family History  Problem Relation Age of Onset  . Stroke Mother   . Hypertension Mother   . Heart disease Father   . Hypertension Father     Social History: Reviewed social history from my progress note on 03/21/2017 Social History   Socioeconomic History  . Marital status: Widowed    Spouse name: Not on file  . Number of children: Not on file  . Years of education: Not on file  . Highest education level: Not on file  Occupational History  . Not on file  Tobacco Use  . Smoking  status: Never Smoker  . Smokeless tobacco: Never Used  . Tobacco comment: quit 1954  Vaping Use  . Vaping Use: Never used  Substance and Sexual Activity  . Alcohol use: No  . Drug use: No  . Sexual activity: Not Currently  Other Topics Concern  . Not on file  Social History Narrative  . Not on file   Social Determinants of Health   Financial Resource Strain: Not on file  Food Insecurity: Not on file  Transportation Needs: Not on file  Physical Activity: Not on file  Stress: Not on file  Social Connections: Not on file    Allergies:  Allergies  Allergen Reactions  . Sulfa Antibiotics Rash and Itching    Other reaction(s): Diarrhea and vomiting (finding)  . Erythromycin Nausea And Vomiting    Other reaction(s): Diarrhea and vomiting (finding)  . Aspirin Other (See Comments), Tinitus and Nausea And Vomiting    Ringing of the ears, caused hearing loss both ears Ringing in ears  . Contrast Media [Iodinated Diagnostic Agents] Rash and Hives  . Latex Rash and Itching  . Penicillin G Rash  . Penicillins Rash    Other reaction(s): UNKNOWN  . Tape Rash    Other reaction(s): UNKNOWN Adhesive Other reaction(s): UNKNOWN Adhesive    Metabolic Disorder Labs: No results found for: HGBA1C, MPG No results found for: PROLACTIN No results found for: CHOL, TRIG, HDL, CHOLHDL, VLDL, LDLCALC Lab Results  Component Value Date   TSH 0.85 11/23/2019   TSH 1.520 10/06/2019    Therapeutic Level Labs: No results found for: LITHIUM No results found for: VALPROATE No components found for:  CBMZ  Current Medications: Current Outpatient Medications  Medication Sig Dispense Refill  . acetaminophen (TYLENOL 8 HOUR ARTHRITIS PAIN) 650 MG CR tablet Take 1 tablet (650 mg total) by mouth every 8 (eight) hours. 270 tablet 1  . albuterol (VENTOLIN HFA) 108 (90 Base) MCG/ACT inhaler Inhale 2 puffs into the lungs every 6 (six) hours as needed for wheezing or shortness of breath. 8 g 2  .  ALPRAZolam (XANAX) 0.25 MG tablet Take 0.25 mg by mouth at bedtime as needed for anxiety.    . AMBULATORY NON FORMULARY MEDICATION Medication Name: incentive spirometry Use as directed 1 each 0  . bismuth subsalicylate (PEPTO BISMOL) 262 MG/15ML suspension Take 30 mLs by mouth as needed.    Marland Kitchen buPROPion (WELLBUTRIN SR) 150 MG 12 hr tablet Take 1 tablet (150 mg total) by mouth daily. 90 tablet 1  . CALCIUM CITRATE PO Take 1 tablet by mouth in the morning and at bedtime.     . carbamide peroxide (DEBROX) 6.5 % OTIC solution Place 5 drops into both ears as needed.    . celecoxib (CELEBREX) 200 MG capsule Take 200 mg by mouth daily.    . cholestyramine (QUESTRAN) 4 g packet ADD 1 PACKET TO 8OZ OF FLUID AND DRINK 1-2  TIMES A DAY AS NEEDED LOOSE STOOLS 180 each 1  . Cyanocobalamin (VITAMIN B12) 500 MCG TABS Take by mouth.    . diphenoxylate-atropine (LOMOTIL) 2.5-0.025 MG tablet Take by mouth. Take 1 tablet by mouth 2 (two) times daily as needed for Diarrhea As need abdominal pain/diarrhea.    . fluticasone (FLONASE) 50 MCG/ACT nasal spray USE 2 SPRAYS NASALLY DAILY 48 g 2  . furosemide (LASIX) 40 MG tablet TAKE 1 TABLET DAILY 90 tablet 4  . guaifenesin (HUMIBID E) 400 MG TABS tablet Take 400 mg by mouth in the morning and at bedtime.    . hydrocortisone 1 % lotion Apply 1 application topically as needed for itching.    . hydrOXYzine (ATARAX/VISTARIL) 25 MG tablet Take 0.5-1 tablets (12.5-25 mg total) by mouth daily as needed. For skin picking, itching and anxiety attacks 90 tablet 1  . levocetirizine (XYZAL) 5 MG tablet TAKE 1 TABLET AT BEDTIME EVERY NIGHT 90 tablet 3  . levothyroxine (SYNTHROID, LEVOTHROID) 50 MCG tablet TAKE 1 TABLET DAILY 90 tablet 4  . Lidocaine 4 % PTCH Use one patch daily. 90 patch 1  . loperamide (IMODIUM) 2 MG capsule Take 2 mg by mouth as needed for diarrhea or loose stools.    . magnesium hydroxide (MILK OF MAGNESIA) 400 MG/5ML suspension Take by mouth daily as needed for  mild constipation. 2 tbsp    . magnesium oxide (MAG-OX) 400 MG tablet Take 400 mg by mouth daily.    . meclizine (ANTIVERT) 12.5 MG tablet Take 12.5 mg by mouth every 4 (four) hours as needed for dizziness.    . melatonin 5 MG TABS Take 5 mg by mouth at bedtime.    . Mouthwashes (MOUTHWASH/GARGLE MT) Use as directed in the mouth or throat. 1 tsp by mouth as needed for sore mouth and tongue    . Multiple Vitamin (MULTIVITAMIN) tablet Take 1 tablet by mouth daily.    Marland Kitchen nystatin (MYCOSTATIN) 100000 UNIT/ML suspension Take 5 mLs (500,000 Units total) by mouth 4 (four) times daily. Swish and swallow 60 mL 0  . nystatin (MYCOSTATIN/NYSTOP) powder Apply 1 application topically 2 (two) times daily as needed.    . ondansetron (ZOFRAN) 4 MG tablet Take 4 mg by mouth as needed for nausea or vomiting.    . OXYGEN Inhale 2 L into the lungs continuous as needed.    . pantoprazole (PROTONIX) 20 MG tablet Take 1 tablet (20 mg total) by mouth 2 (two) times daily. 180 tablet 1  . Phenylephrine-DM-GG-APAP (DELSYM COUGH/COLD DAYTIME) 5-10-200-325 MG/10ML LIQD Take 5 mLs by mouth as needed.    . Probiotic Product (PROBIOTIC COLON SUPPORT PO) Take 1 capsule by mouth daily.    Marland Kitchen senna (SENOKOT) 8.6 MG TABS tablet Take 1 tablet (8.6 mg total) by mouth at bedtime. 90 tablet 1  . simvastatin (ZOCOR) 20 MG tablet TAKE 1 TABLET DAILY 90 tablet 4  . spironolactone (ALDACTONE) 25 MG tablet Take 1 tablet (25 mg total) by mouth daily. 90 tablet 0  . tamsulosin (FLOMAX) 0.4 MG CAPS capsule TAKE 1 CAPSULE DAILY 90 capsule 4  . tiotropium (SPIRIVA HANDIHALER) 18 MCG inhalation capsule Place 1 capsule (18 mcg total) into inhaler and inhale daily. 90 capsule 3  . venlafaxine XR (EFFEXOR-XR) 75 MG 24 hr capsule TAKE 1 CAPSULE DAILY WITH BREAKFAST 90 capsule 3   No current facility-administered medications for this visit.     Musculoskeletal: Strength & Muscle Tone: UTA Gait & Station: UTA Patient leans: N/A  Psychiatric  Specialty Exam: Review of Systems  Psychiatric/Behavioral: Positive for sleep disturbance.  All other systems reviewed and are negative.   There were no vitals taken for this visit.There is no height or weight on file to calculate BMI.  General Appearance: Casual  Eye Contact:  Fair  Speech:  Clear and Coherent  Volume:  Normal  Mood:  Euthymic  Affect:  Congruent  Thought Process:  Goal Directed and Descriptions of Associations: Intact  Orientation:  Other:  Person, place, situation  Thought Content: Logical   Suicidal Thoughts:  No  Homicidal Thoughts:  No  Memory:  Immediate;   Fair Recent;   limited Remote;   limited  Judgement:  Fair  Insight:  Fair  Psychomotor Activity:  Normal  Concentration:  Concentration: Fair and Attention Span: Fair  Recall:  [employer] of Knowledge: Fair  Language: Fair  Akathisia:  No  Handed:  Right  AIMS (if indicated): UTA  Assets:  Chief Executive Officer Social Support  ADL's:  Intact  Cognition: Impaired,  Age related  Sleep:  improving   Screenings: PHQ2-9   [place] Office Visit from 10/06/2019 in [facility] Procedure visit from 09/04/2015 in [facility] Procedure visit from 10/03/2014 in [facility] Visit from 09[facility]21/2014 in [facility]  PHQ-2 Total Score 2 0 0 1  PHQ-9 Total Score 4 -- -- --       Assessment and Plan: Sandra Brown is a 86 year old Caucasian female who has a history of depression, COPD, hypothyroidism, hypertension, renal function abnormalities was evaluated by telemedicine today.  Patient is currently struggling with sleep hygiene otherwise is doing well.  Plan as noted below.  Plan MDD in remission Wellbutrin SR 150 mg p.o. daily Venlafaxine XR 75 mg p.o. daily  Skin picking disorder-stable Continue to monitor closely, it does not distress  her much at this time.  Insomnia-some improvement Patient advised to work on sleep hygiene techniques. Staff at ALF will try to support.  Collateral information was obtained from staff at assisted living facility as noted above.  Follow-up in clinic in 4 to 5 months or sooner if needed.  I have spent atleast 20 minutes face to face by video with patient today. More than 50 % of the time was spent for preparing to see the patient ( e.g., review of test, records ), ordering medications and test ,psychoeducation and supportive psychotherapy and care coordination,as well as documenting clinical information in electronic health record. This note was generated in part or whole with voice recognition software. Voice recognition is usually quite accurate but there are transcription errors that can and very often do occur. I apologize for any typographical errors that were not detected and corrected.       Ursula Alert, MD 12/30/2019, 6:20 PM

## 2020-01-14 DIAGNOSIS — M6281 Muscle weakness (generalized): Secondary | ICD-10-CM | POA: Diagnosis not present

## 2020-01-14 DIAGNOSIS — R293 Abnormal posture: Secondary | ICD-10-CM | POA: Diagnosis not present

## 2020-01-14 DIAGNOSIS — R2689 Other abnormalities of gait and mobility: Secondary | ICD-10-CM | POA: Diagnosis not present

## 2020-01-14 DIAGNOSIS — M5441 Lumbago with sciatica, right side: Secondary | ICD-10-CM | POA: Diagnosis not present

## 2020-01-19 ENCOUNTER — Encounter: Payer: Self-pay | Admitting: Physician Assistant

## 2020-01-19 ENCOUNTER — Telehealth: Payer: Self-pay

## 2020-01-19 ENCOUNTER — Other Ambulatory Visit: Payer: Self-pay

## 2020-01-19 ENCOUNTER — Ambulatory Visit (INDEPENDENT_AMBULATORY_CARE_PROVIDER_SITE_OTHER): Payer: Medicare Other | Admitting: Physician Assistant

## 2020-01-19 VITALS — BP 115/43 | HR 96 | Temp 98.0°F | Wt 111.6 lb

## 2020-01-19 DIAGNOSIS — R5383 Other fatigue: Secondary | ICD-10-CM

## 2020-01-19 DIAGNOSIS — G4700 Insomnia, unspecified: Secondary | ICD-10-CM

## 2020-01-19 DIAGNOSIS — M5416 Radiculopathy, lumbar region: Secondary | ICD-10-CM

## 2020-01-19 DIAGNOSIS — M533 Sacrococcygeal disorders, not elsewhere classified: Secondary | ICD-10-CM

## 2020-01-19 MED ORDER — DOXEPIN HCL 3 MG PO TABS: 3.0000 mg | ORAL_TABLET | Freq: Every evening | ORAL | 0 refills | Status: DC | PRN

## 2020-01-19 NOTE — Telephone Encounter (Signed)
Copied from [place] [id number]. Topic: General - Other >> Jan 19, 2020  1:29 PM Yvette Rack wrote: Reason for CRM: Teresa Pelton of Nursing at [facility] called to get some clarification of the pt paperwork. [name] also stated that the patient uses [facility] and she thinks the Rx was sent to [facility]. Cb# [phone]

## 2020-01-19 NOTE — Telephone Encounter (Signed)
Sandra Brown was advised and form was faxed to her as well.

## 2020-01-19 NOTE — Telephone Encounter (Signed)
Resent through [facility]. Can we write verbal order on the sheet for CBC, CMET and TSH under fatiue and fax it over to [place]? Thanks.

## 2020-01-19 NOTE — Progress Notes (Signed)
Established patient visit   [name]   DOB: [birth year]   87 y.o. Female  MRN: [id number] Visit Date: 01/19/2020  Today's healthcare provider: Trinna Post, PA-C   Chief Complaint  Patient presents with  . Leg Pain  I,Porsha C McClurkin,acting as a scribe for Trinna Post, PA-C.,have documented all relevant documentation on the behalf of Trinna Post, PA-C,as directed by  Trinna Post, PA-C while in the presence of Trinna Post, PA-C.  Subjective    Leg Pain  The incident occurred more than 1 week ago. There was no injury mechanism. The pain is present in the left leg and right leg. The quality of the pain is described as aching. The pain is at a severity of 4/10. The pain is mild. The pain has been fluctuating since onset. Pertinent negatives include no inability to bear weight, numbness or tingling. She reports no foreign bodies present. Nothing aggravates the symptoms. She has tried acetaminophen for the symptoms. The treatment provided mild relief.    Patient with history of lumbar radiculopathy previously seen by Dr. Primus Bravo in 2015. She had epidural steroid injections and other procedures. She is having occasional pain currently. She is interested internment. She underwent lumbar MRI with results below:    FINDINGS:  Prominent dextroconvex lower lumbar scoliosis and levoconvex upper  lumbar horn and thoracic scoliosis. Vertebral augmentations at L3  and L4. Prominent multilevel loss of intervertebral disc height.   The lowest lumbar type non-rib-bearing vertebra is labeled as L5.  The conus medullaris appears normal. Conus level: L1.   Type 1 degenerative endplate findings at V0-3. Chronic inferior  endplate compressions at T10 and T11.   Left psoas and paraspinal atrophy noted.   Dorsal subcutaneous fluid collection posterior to the lumbosacral  junction, 4.3 x 1.4 by 5.4 cm.   There is grade 1 degenerative anterolisthesis at  L4-5.   There is mild central narrowing of the thecal sac at T10-11 due to a  disc bulge and facet arthropathy. Additional findings at individual  levels are as follows:   T11-12: Mild central narrowing of the thecal sac due to disc bulge  and facet arthropathy.   T12-L1: Mild right foraminal stenosis and mild right eccentric  flattening of the distal cord related to right greater than left  facet arthropathy, disc bulge, shallow right foraminal disc  protrusion, and intervertebral spurring.   L1-2: Mild right and foraminal stenosis secondary to intervertebral  and facet spurring, stable.   L2-3: Stable moderate bilateral foraminal stenosis and mild left  subarticular lateral recess stenosis secondary to intervertebral and  facet spurring along with disc bulge.   L3-4: Moderate left and borderline right foraminal stenosis with  mild left subarticular lateral recess stenosis due to disc bulge,  intervertebral spurring, and facet arthropathy, left foraminal  involvement mildly worse compared to prior.   L4-5: Marked central narrowing of the thecal sac with prominent  bilateral subarticular lateral recess stenosis and prominent left  and moderate right foraminal stenosis due to disc uncovering, left  lateral recess and foraminal disc protrusion, facet arthropathy, and  intervertebral spurring. Overall severity similar to prior.  Cross-sectional area of the thecal sac approximately 0.2 cm^2.   L5-S1: Mild right subarticular lateral recess stenosis and  borderline right foraminal stenosis due to facet arthropathy and  disc bulge.   Wt Readings from Last 3 Encounters:  01/19/20 111 lb 9.6 oz (50.6 kg)  10/06/19 117 lb  3.2 oz (53.2 kg)  04/13/19 116 lb (52.6 kg)       Medications: Outpatient Medications Prior to Visit  Medication Sig  . acetaminophen (TYLENOL 8 HOUR ARTHRITIS PAIN) 650 MG CR tablet Take 1 tablet (650 mg total) by mouth every 8 (eight) hours.  Marland Kitchen  albuterol (VENTOLIN HFA) 108 (90 Base) MCG/ACT inhaler Inhale 2 puffs into the lungs every 6 (six) hours as needed for wheezing or shortness of breath.  . ALPRAZolam (XANAX) 0.25 MG tablet Take 0.25 mg by mouth at bedtime as needed for anxiety.  . AMBULATORY NON FORMULARY MEDICATION Medication Name: incentive spirometry Use as directed  . bismuth subsalicylate (PEPTO BISMOL) 262 MG/15ML suspension Take 30 mLs by mouth as needed.  Marland Kitchen buPROPion (WELLBUTRIN SR) 150 MG 12 hr tablet Take 1 tablet (150 mg total) by mouth daily.  Marland Kitchen CALCIUM CITRATE PO Take 1 tablet by mouth in the morning and at bedtime.   . carbamide peroxide (DEBROX) 6.5 % OTIC solution Place 5 drops into both ears as needed.  . celecoxib (CELEBREX) 200 MG capsule Take 200 mg by mouth daily.  . cholestyramine (QUESTRAN) 4 g packet ADD 1 PACKET TO 8OZ OF FLUID AND DRINK 1-2 TIMES A DAY AS NEEDED LOOSE STOOLS  . Cyanocobalamin (VITAMIN B12) 500 MCG TABS Take by mouth.  . diphenoxylate-atropine (LOMOTIL) 2.5-0.025 MG tablet Take by mouth. Take 1 tablet by mouth 2 (two) times daily as needed for Diarrhea As need abdominal pain/diarrhea.  . fluticasone (FLONASE) 50 MCG/ACT nasal spray USE 2 SPRAYS NASALLY DAILY  . furosemide (LASIX) 40 MG tablet TAKE 1 TABLET DAILY  . guaifenesin (HUMIBID E) 400 MG TABS tablet Take 400 mg by mouth in the morning and at bedtime.  . hydrocortisone 1 % lotion Apply 1 application topically as needed for itching.  . hydrOXYzine (ATARAX/VISTARIL) 25 MG tablet Take 0.5-1 tablets (12.5-25 mg total) by mouth daily as needed. For skin picking, itching and anxiety attacks  . levocetirizine (XYZAL) 5 MG tablet TAKE 1 TABLET AT BEDTIME EVERY NIGHT  . levothyroxine (SYNTHROID, LEVOTHROID) 50 MCG tablet TAKE 1 TABLET DAILY  . Lidocaine 4 % PTCH Use one patch daily.  Marland Kitchen loperamide (IMODIUM) 2 MG capsule Take 2 mg by mouth as needed for diarrhea or loose stools.  . magnesium hydroxide (MILK OF MAGNESIA) 400 MG/5ML suspension  Take by mouth daily as needed for mild constipation. 2 tbsp  . magnesium oxide (MAG-OX) 400 MG tablet Take 400 mg by mouth daily.  . meclizine (ANTIVERT) 12.5 MG tablet Take 12.5 mg by mouth every 4 (four) hours as needed for dizziness.  . melatonin 5 MG TABS Take 5 mg by mouth at bedtime.  . Mouthwashes (MOUTHWASH/GARGLE MT) Use as directed in the mouth or throat. 1 tsp by mouth as needed for sore mouth and tongue  . Multiple Vitamin (MULTIVITAMIN) tablet Take 1 tablet by mouth daily.  Marland Kitchen nystatin (MYCOSTATIN) 100000 UNIT/ML suspension Take 5 mLs (500,000 Units total) by mouth 4 (four) times daily. Swish and swallow  . nystatin (MYCOSTATIN/NYSTOP) powder Apply 1 application topically 2 (two) times daily as needed.  . ondansetron (ZOFRAN) 4 MG tablet Take 4 mg by mouth as needed for nausea or vomiting.  . OXYGEN Inhale 2 L into the lungs continuous as needed.  . pantoprazole (PROTONIX) 20 MG tablet Take 1 tablet (20 mg total) by mouth 2 (two) times daily.  Marland Kitchen Phenylephrine-DM-GG-APAP (DELSYM COUGH/COLD DAYTIME) 5-10-200-325 MG/10ML LIQD Take 5 mLs by mouth as needed.  Marland Kitchen  Probiotic Product (PROBIOTIC COLON SUPPORT PO) Take 1 capsule by mouth daily.  Marland Kitchen senna (SENOKOT) 8.6 MG TABS tablet Take 1 tablet (8.6 mg total) by mouth at bedtime.  . simvastatin (ZOCOR) 20 MG tablet TAKE 1 TABLET DAILY  . spironolactone (ALDACTONE) 25 MG tablet Take 1 tablet (25 mg total) by mouth daily.  . tamsulosin (FLOMAX) 0.4 MG CAPS capsule TAKE 1 CAPSULE DAILY  . tiotropium (SPIRIVA HANDIHALER) 18 MCG inhalation capsule Place 1 capsule (18 mcg total) into inhaler and inhale daily.  Marland Kitchen venlafaxine XR (EFFEXOR-XR) 75 MG 24 hr capsule TAKE 1 CAPSULE DAILY WITH BREAKFAST   No facility-administered medications prior to visit.    Review of Systems  Constitutional: Negative.   Musculoskeletal: Positive for back pain.  Neurological: Negative for tingling and numbness.      Objective    BP (!) 115/43 (BP Location: Right  Arm, Patient Position: Sitting, Cuff Size: Large)   Pulse 96   Temp 98 F (36.7 C) (Oral)   Wt 111 lb 9.6 oz (50.6 kg)   SpO2 98%   BMI 21.80 kg/m    Physical Exam Constitutional:      Appearance: Normal appearance.  Cardiovascular:     Rate and Rhythm: Normal rate and regular rhythm.     Heart sounds: Normal heart sounds.  Pulmonary:     Effort: Pulmonary effort is normal.     Breath sounds: Normal breath sounds.  Skin:    General: Skin is warm and dry.  Neurological:     Mental Status: She is alert and oriented to person, place, and time. Mental status is at baseline.  Psychiatric:        Mood and Affect: Mood normal.        Behavior: Behavior normal.       No results found for any visits on 01/19/20.  Assessment & Plan    1. Lumbar radiculopathy  Longstanding issue, will refer to orthopedics due to advanced arthritic disease.   - Ambulatory referral to Orthopedics  2. Sacroiliac joint dysfunction  - Ambulatory referral to Orthopedics  3. Insomnia, unspecified type  Fatigue is likely multifactorial due to insomnia, depression, anemia and age. Will try on low dose doexpin 6 mgQHS. Have reached out to her psychiatrist. Have also faxed order for CBC, iron panel, CMET and TSH to long term care facility to check on status of this. She has history of one previous iron infusion but did not return for more.   4. Other fatigue    No follow-ups on file.      ITrinna Post, PA-C, have reviewed all documentation for this visit. The documentation on 01/26/20 for the exam, diagnosis, procedures, and orders are all accurate and complete.  The entirety of the information documented in the History of Present Illness, Review of Systems and Physical Exam were personally obtained by me. Portions of this information were initially documented by [facility] and reviewed by me for thoroughness and accuracy.     Paulene Floor  [facility] [phone] (phone) [phone] (fax)  [place]

## 2020-01-21 DIAGNOSIS — R2689 Other abnormalities of gait and mobility: Secondary | ICD-10-CM | POA: Diagnosis not present

## 2020-01-21 DIAGNOSIS — M6281 Muscle weakness (generalized): Secondary | ICD-10-CM | POA: Diagnosis not present

## 2020-01-21 DIAGNOSIS — R293 Abnormal posture: Secondary | ICD-10-CM | POA: Diagnosis not present

## 2020-01-21 DIAGNOSIS — M5441 Lumbago with sciatica, right side: Secondary | ICD-10-CM | POA: Diagnosis not present

## 2020-01-22 ENCOUNTER — Other Ambulatory Visit: Payer: Self-pay | Admitting: Psychiatry

## 2020-01-22 ENCOUNTER — Other Ambulatory Visit: Payer: Self-pay | Admitting: Physician Assistant

## 2020-01-22 DIAGNOSIS — J309 Allergic rhinitis, unspecified: Secondary | ICD-10-CM

## 2020-01-22 DIAGNOSIS — F330 Major depressive disorder, recurrent, mild: Secondary | ICD-10-CM

## 2020-01-22 DIAGNOSIS — R600 Localized edema: Secondary | ICD-10-CM

## 2020-01-22 DIAGNOSIS — E890 Postprocedural hypothyroidism: Secondary | ICD-10-CM

## 2020-01-22 DIAGNOSIS — F3342 Major depressive disorder, recurrent, in full remission: Secondary | ICD-10-CM

## 2020-01-22 DIAGNOSIS — E782 Mixed hyperlipidemia: Secondary | ICD-10-CM

## 2020-01-23 NOTE — Telephone Encounter (Signed)
Notes to clinic:  Medication filled by a different provider Review for refill   Requested Prescriptions  Pending Prescriptions Disp Refills   simvastatin (ZOCOR) 20 MG tablet [Pharmacy Med Name: SIMVASTATIN TABS 20MG ] 90 tablet 3    Sig: TAKE 1 TABLET DAILY      Cardiovascular:  Antilipid - Statins Failed - 01/22/2020  7:28 AM      Failed - Total Cholesterol in normal range and within 360 days    No results found for: CHOL, POCCHOL, CHOLTOT        Failed - LDL in normal range and within 360 days    No results found for: LDLCALC, LDLC, HIRISKLDL, POCLDL, LDLDIRECT, REALLDLC, TOTLDLC        Failed - HDL in normal range and within 360 days    No results found for: HDL, POCHDL        Failed - Triglycerides in normal range and within 360 days    No results found for: TRIG, POCTRIG        Passed - Patient is not pregnant      Passed - Valid encounter within last 12 months    Recent Outpatient Visits           4 days ago Lumbar radiculopathy   [place], Adriana M, PA-C   1 month ago Right hip pain   [facility] Carles Collet M, PA-C   3 months ago Gastroesophageal reflux disease, unspecified whether esophagitis present   [facility] [place], Adriana M, PA-C   4 months ago [facility], Dionne Bucy, MD   1 year ago [place], Adriana M, PA-C                  furosemide (LASIX) 40 MG tablet [Pharmacy Med Name: FUROSEMIDE TABS 40MG ] 90 tablet 3    Sig: TAKE 1 TABLET DAILY      Cardiovascular:  Diuretics - Loop Failed - 01/22/2020  7:28 AM      Failed - Cr in normal range and within 360 days    Creatinine  Date Value Ref Range Status  11/23/2019 1.3 (A) 0.5 - 1.1 Final   Creatinine, Ser  Date Value Ref Range Status  10/06/2019 1.57 (H) 0.57 - 1.00 mg/dL Final          Failed - Last BP in normal range    BP Readings from Last 1  Encounters:  01/19/20 (!) 115/43          Passed - K in normal range and within 360 days    Potassium  Date Value Ref Range Status  11/23/2019 4.2 3.4 - 5.3 Final          Passed - Ca in normal range and within 360 days    Calcium  Date Value Ref Range Status  11/23/2019 8.9 8.7 - 10.7 Final          Passed - Na in normal range and within 360 days    Sodium  Date Value Ref Range Status  11/23/2019 143 137 - 147 Final          Passed - Valid encounter within last 6 months    Recent Outpatient Visits           4 days ago Lumbar radiculopathy   [place], [place], PA-C   1 month ago Right hip pain   [facility] [place], PA-C   3 months ago Gastroesophageal reflux disease, unspecified whether esophagitis present   [facility] Carles Collet M, PA-C   4 months ago [facility], Dionne Bucy, MD   1 year ago [place], [place], [place]                  tamsulosin (FLOMAX) 0.4 MG CAPS capsule [Pharmacy Med Name: TAMSULOSIN HCL CAPS 0.4MG ] 90 capsule 3    Sig: TAKE 1 CAPSULE DAILY      Urology: Alpha-Adrenergic Blocker Failed - 01/22/2020  7:28 AM      Failed - Last BP in normal range    BP Readings from Last 1 Encounters:  01/19/20 (!) 115/43          Passed - Valid encounter within last 12 months    Recent Outpatient Visits           4 days ago Lumbar radiculopathy   [employer], [place], [place]   1 month ago Right hip pain   [place], [place]   3 months ago Gastroesophageal reflux disease, unspecified whether esophagitis present   [place], Wendee Beavers, PA-C   4 months ago [facility], Dionne Bucy, MD   1 year ago [place], Adriana M, PA-C                  SYNTHROID  50 MCG tablet [Pharmacy Med Name: SYNTHROID TABS 50MCG] 90 tablet 3    Sig: TAKE 1 TABLET DAILY      Endocrinology:  Hypothyroid Agents Failed - 01/22/2020  7:28 AM      Failed - TSH needs to be rechecked within 3 months after an abnormal result. Refill until TSH is due.      Passed - TSH in normal range and within 360 days    TSH  Date Value Ref Range Status  11/23/2019 0.85 0.41 - 5.90 Final  10/06/2019 1.520 0.450 - 4.500 uIU/mL Final          Passed - Valid encounter within last 12 months    Recent Outpatient Visits           4 days ago Lumbar radiculopathy   [place], [place], PA-C   1 month ago Right hip pain   [place], [place]   3 months ago Gastroesophageal reflux disease, unspecified whether esophagitis present   [place], Wendee Beavers, PA-C   4 months ago [facility], Dionne Bucy, MD   1 year ago [place], [place], [place]                 Signed Prescriptions Disp Refills   SPIRIVA HANDIHALER 18 MCG inhalation capsule 90 capsule 0    Sig: PLACE 1 CAPSULE INTO INHALER AND INHALE DAILY      Pulmonology:  Anticholinergic Agents Passed - 01/22/2020  7:28 AM      Passed - Valid encounter within last 12 months    Recent Outpatient Visits           4 days ago Lumbar radiculopathy   [place], [place], PA-C   1 month ago Right hip pain   [facility] [place], [place]  M, PA-C   3 months ago Gastroesophageal reflux disease, unspecified whether esophagitis present   [facility] [place], Wendee Beavers, PA-C   4 months ago [facility], Dionne Bucy, MD   1 year ago [place], Adriana M, PA-C                  fluticasone [facility]) 50 MCG/ACT nasal spray 48 g 1    Sig: USE 2 SPRAYS NASALLY  DAILY      Ear, Nose, and Throat: Nasal Preparations - Corticosteroids Passed - 01/22/2020  7:28 AM      Passed - Valid encounter within last 12 months    Recent Outpatient Visits           4 days ago Lumbar radiculopathy   [place], [place], PA-C   1 month ago Right hip pain   [place], [place]   3 months ago Gastroesophageal reflux disease, unspecified whether esophagitis present   [place], Wendee Beavers, PA-C   4 months ago [facility], Dionne Bucy, MD   1 year ago [place] Carles Collet [place], [place]

## 2020-01-24 DIAGNOSIS — R2689 Other abnormalities of gait and mobility: Secondary | ICD-10-CM | POA: Diagnosis not present

## 2020-01-24 DIAGNOSIS — M6281 Muscle weakness (generalized): Secondary | ICD-10-CM | POA: Diagnosis not present

## 2020-01-24 DIAGNOSIS — M5441 Lumbago with sciatica, right side: Secondary | ICD-10-CM | POA: Diagnosis not present

## 2020-01-24 DIAGNOSIS — R293 Abnormal posture: Secondary | ICD-10-CM | POA: Diagnosis not present

## 2020-01-25 ENCOUNTER — Encounter: Payer: Self-pay | Admitting: Physician Assistant

## 2020-01-25 DIAGNOSIS — B370 Candidal stomatitis: Secondary | ICD-10-CM

## 2020-01-26 DIAGNOSIS — M6281 Muscle weakness (generalized): Secondary | ICD-10-CM | POA: Diagnosis not present

## 2020-01-26 DIAGNOSIS — R293 Abnormal posture: Secondary | ICD-10-CM | POA: Diagnosis not present

## 2020-01-26 DIAGNOSIS — R2689 Other abnormalities of gait and mobility: Secondary | ICD-10-CM | POA: Diagnosis not present

## 2020-01-26 DIAGNOSIS — M5441 Lumbago with sciatica, right side: Secondary | ICD-10-CM | POA: Diagnosis not present

## 2020-01-26 NOTE — Telephone Encounter (Signed)
Looks like she had it in the past. [facility]? Doesn't look like it can be e-prescribed. Usually nystatin, lidocaine, benadryl and/or maalox. I will take whatever formulation they have. 5 ML swish and spit TID PRN. Can distributed 120 mL or 240 mL based on their size.

## 2020-01-27 NOTE — Telephone Encounter (Signed)
Magic mouth wash was called into pharmacy to [facility]. Ebony Hail states that the old order didn't have lidocaine and I told her it was okay to use the old order. Just a [place]

## 2020-01-28 ENCOUNTER — Telehealth: Payer: Self-pay | Admitting: Physician Assistant

## 2020-01-28 DIAGNOSIS — R2689 Other abnormalities of gait and mobility: Secondary | ICD-10-CM | POA: Diagnosis not present

## 2020-01-28 DIAGNOSIS — M5441 Lumbago with sciatica, right side: Secondary | ICD-10-CM | POA: Diagnosis not present

## 2020-01-28 DIAGNOSIS — G4700 Insomnia, unspecified: Secondary | ICD-10-CM

## 2020-01-28 DIAGNOSIS — R293 Abnormal posture: Secondary | ICD-10-CM | POA: Diagnosis not present

## 2020-01-28 DIAGNOSIS — M6281 Muscle weakness (generalized): Secondary | ICD-10-CM | POA: Diagnosis not present

## 2020-01-28 NOTE — Telephone Encounter (Signed)
Adrianna DON at [facility] is calling to report that the patient Doxepinel that was ordered 3mg . [facility]  Or the medication can be changed the - the pharmacy can not find the drug in 3mg .Please advise [phone]

## 2020-01-31 MED ORDER — DOXEPIN HCL 3 MG PO TABS: 3.0000 mg | ORAL_TABLET | Freq: Every evening | ORAL | 0 refills | Status: DC | PRN

## 2020-01-31 NOTE — Telephone Encounter (Signed)
[facility] I did touch base with her psychiatrist who recommends follow up with EKG as it can have some interaction with her other medication. Would recommend seeing her in 2-4 weeks to do this if she continues taking the medication.

## 2020-02-02 DIAGNOSIS — R293 Abnormal posture: Secondary | ICD-10-CM | POA: Diagnosis not present

## 2020-02-02 DIAGNOSIS — M5441 Lumbago with sciatica, right side: Secondary | ICD-10-CM | POA: Diagnosis not present

## 2020-02-02 DIAGNOSIS — R2689 Other abnormalities of gait and mobility: Secondary | ICD-10-CM | POA: Diagnosis not present

## 2020-02-02 DIAGNOSIS — M6281 Muscle weakness (generalized): Secondary | ICD-10-CM | POA: Diagnosis not present

## 2020-02-04 DIAGNOSIS — R293 Abnormal posture: Secondary | ICD-10-CM | POA: Diagnosis not present

## 2020-02-04 DIAGNOSIS — M5441 Lumbago with sciatica, right side: Secondary | ICD-10-CM | POA: Diagnosis not present

## 2020-02-04 DIAGNOSIS — R2689 Other abnormalities of gait and mobility: Secondary | ICD-10-CM | POA: Diagnosis not present

## 2020-02-04 DIAGNOSIS — M6281 Muscle weakness (generalized): Secondary | ICD-10-CM | POA: Diagnosis not present

## 2020-02-10 ENCOUNTER — Telehealth: Payer: Self-pay | Admitting: Physician Assistant

## 2020-02-10 DIAGNOSIS — R634 Abnormal weight loss: Secondary | ICD-10-CM

## 2020-02-10 MED ORDER — MIRTAZAPINE 7.5 MG PO TABS: 7.5000 mg | ORAL_TABLET | Freq: Every day | ORAL | 1 refills | Status: DC

## 2020-02-10 NOTE — Telephone Encounter (Signed)
Note below has been faxed to nurse at [facility].

## 2020-02-10 NOTE — Telephone Encounter (Signed)
Regarding the fax from [facility], I have sent in Remeron 7.5 mg QHS. This can interact with her effexor and she will likely need an EKG several weeks after starting to monitor. Can cause drowsiness. Also if she has 10 lb weight loss since December - must consider depression but also must consider cancer syndrome, particularly in light of her IDA. If she wants me to look further into it, we can but I understand she may not want that. Can talk to her nurse [name] at [place] at [facility].

## 2020-02-15 ENCOUNTER — Telehealth: Payer: Self-pay

## 2020-02-15 NOTE — Telephone Encounter (Signed)
Copied from [place] [phone]. Topic: General - Other >> Feb 15, 2020 12:25 PM Tessa Lerner A wrote: Reason for CRM: From [facility] made contact requesting to speak with a member of clinical staff regarding a previously requested order for remeron Patient started on Doxepin and gained roughly 4lbs in a week

## 2020-02-16 ENCOUNTER — Telehealth: Payer: Self-pay

## 2020-02-16 DIAGNOSIS — R634 Abnormal weight loss: Secondary | ICD-10-CM

## 2020-02-16 NOTE — Telephone Encounter (Signed)
Sandra Brown calling in stating she is very frustrated today re a fax being sent over multiple times and wants a response. I  verified fax#  And she has resent.She did not seem want to give further information as she just wanted me to inform front desk to get the fax and deliver promptly to the dr. or her nurse. I assured her that I would send a note over to be seen as soon as staff returned,

## 2020-02-16 NOTE — Telephone Encounter (Signed)
[facility] fax[facility] refill request for the following medications:  mirtazapine (REMERON) 7.5 MG tablet   Please advise. Thanks, [employer]

## 2020-02-17 MED ORDER — MIRTAZAPINE 7.5 MG PO TABS
7.5000 mg | ORAL_TABLET | Freq: Every day | ORAL | 1 refills | Status: DC
Start: 2020-02-17 — End: 2020-05-30

## 2020-02-17 NOTE — Telephone Encounter (Signed)
I have reviewed fax where patient received doxepin and is gaining weight. We can hold off on the remeron and we can call audrey RN and tell her that.

## 2020-02-17 NOTE — Telephone Encounter (Signed)
Medication and forms were faxed on 02/10/2020. Can view that telephone encounter for more information.

## 2020-02-17 NOTE — Telephone Encounter (Signed)
Spoke to Edwards, RN w/ [facility] and she was advised. Sandra Brown states that she received the Remeron last week and has it but didn't start the medication due to note from you stating at the Remeron can interact with patient Effexor. She states that the patient continues to gain weight with the Doxepin and supplements that is giving. Sandra Brown states that she doesn't want the patient to have any cardiac problems as well. Just a [place]

## 2020-02-17 NOTE — Telephone Encounter (Signed)
Message addressed via another message.

## 2020-02-18 ENCOUNTER — Ambulatory Visit (INDEPENDENT_AMBULATORY_CARE_PROVIDER_SITE_OTHER): Payer: Medicare Other | Admitting: Podiatry

## 2020-02-18 ENCOUNTER — Other Ambulatory Visit: Payer: Self-pay

## 2020-02-18 ENCOUNTER — Encounter: Payer: Self-pay | Admitting: Podiatry

## 2020-02-18 DIAGNOSIS — M79675 Pain in left toe(s): Secondary | ICD-10-CM | POA: Diagnosis not present

## 2020-02-18 DIAGNOSIS — B351 Tinea unguium: Secondary | ICD-10-CM | POA: Diagnosis not present

## 2020-02-18 DIAGNOSIS — M79674 Pain in right toe(s): Secondary | ICD-10-CM | POA: Diagnosis not present

## 2020-02-18 DIAGNOSIS — M2041 Other hammer toe(s) (acquired), right foot: Secondary | ICD-10-CM

## 2020-02-18 DIAGNOSIS — M2042 Other hammer toe(s) (acquired), left foot: Secondary | ICD-10-CM

## 2020-02-18 DIAGNOSIS — L97511 Non-pressure chronic ulcer of other part of right foot limited to breakdown of skin: Secondary | ICD-10-CM

## 2020-02-18 NOTE — Progress Notes (Signed)
   SUBJECTIVE Patient presents to office today complaining of elongated, thickened nails that cause pain while ambulating in shoes. She is unable to trim her own nails. Patient is also concerned for hammertoe deformities that have developed over the past year. She has not done anything for treatment. Patient is here for further evaluation and treatment.  Past Medical History:  Diagnosis Date  . Cataract   . COPD (chronic obstructive pulmonary disease) ([place])   . Depression   . Difficulty swallowing   . Frequent headaches   . Hearing loss   . Hypertension   . Hypothyroidism   . Reflux     OBJECTIVE General Patient is awake, alert, and oriented x 3 and in no acute distress. Derm Skin is dry and supple bilateral. Negative open lesions or macerations. Remaining integument unremarkable. Nails are tender, long, thickened and dystrophic with subungual debris, consistent with onychomycosis, 1-5 bilateral. No signs of infection noted. Superficial wound noted to the dorsal aspect of the right forefoot measuring approximately 1 cm in diameter. This is limited to breakdown of skin. Patient states that this developed because of the different pair of shoes that she wore. She is no longer wearing the shoes. Vasc  DP and PT pedal pulses palpable bilaterally. Temperature gradient within normal limits.  Neuro Epicritic and protective threshold sensation grossly intact bilaterally.  Musculoskeletal Exam No symptomatic pedal deformities noted bilateral. Muscular strength within normal limits. Clinical evidence of a hammertoe deformity noted 2-5 bilateral. There is no associated tenderness to palpation  ASSESSMENT 1. Pain due to onychomycosis of toenail both 2. Ulcer right foot limited to breakdown of skin 3. Hammertoe deformity 2-5 bilateral  PLAN OF CARE 1. Patient evaluated today.  2. Instructed to maintain good pedal hygiene and foot care.  3. Mechanical debridement of nails 1-5 bilaterally performed  using a nail nipper. Filed with dremel without incident.  4. Today we also discussed hammertoe pathology and the deformity to her hammertoes. She is concerned because she states that they have only developed over the past year. Recommend conservative treatment good supportive shoes that do not irritate the hammertoes. She states that she had heard about surgery to correct for the hammertoes however we are going to pursue conservative treatment 5. Light debridement of the superficial skin ulcer was performed using a tissue nipper. Recommend antibiotic ointment and a Band-Aid 6. Return to clinic in 3 months for routine foot care   Edrick Kins, DPM [facility]  Dr. Edrick Kins, [place]                                        [place], [place] [address]                Office [phone]  Fax [phone]

## 2020-03-23 ENCOUNTER — Telehealth: Payer: Self-pay | Admitting: Physician Assistant

## 2020-03-23 NOTE — Telephone Encounter (Signed)
Received a fax from [facility] about swollen, red and tender lower leg and request for abx. Patient will need to be evaluated to r/o other causes of this issue, including blood clot. She can do virtual video if easier for her.

## 2020-03-24 NOTE — Telephone Encounter (Signed)
Order was faxed back to nurse at [facility]

## 2020-03-27 ENCOUNTER — Telehealth: Payer: Self-pay

## 2020-03-27 DIAGNOSIS — I82491 Acute embolism and thrombosis of other specified deep vein of right lower extremity: Secondary | ICD-10-CM | POA: Diagnosis not present

## 2020-03-27 NOTE — Telephone Encounter (Signed)
Received call back from RN, Nira Conn, at [facility].  Nira Conn is the [facility] nurse that was filling in for the regular RN.  After her assessment of the patient she reported that the leg was slightly red-which is less than yesterday.  Warm to the touch in one spot, pain in the front and side of leg and lower extremity edema.  Nira Conn reports that the patient states this isn't a blood clot as she has history of cellulitis and she thinks that is what this is today.  RN states it would be difficult to get patient to the ER.  She would have to call the daughter and let her know and she would need justification for sending her to ER.  She also said that they were concerned with the wait time at the ER.  Also they say this has been going on for a week. Advised that we would keep the appointment that was scheduled for Wednesday in the event they go to the ER we will use it as a follow up.  If they do not go to the hospital they are going to keep the appointment here.

## 2020-03-27 NOTE — Telephone Encounter (Signed)
[facility] just called back and said they talked with the daughter and she didn't really want her mother sitting in the ER.  [facility] is able to and has ordered mobile doppler study and it will be done tonight.  They will be sure to get [place] a copy of the report.

## 2020-03-27 NOTE — Telephone Encounter (Signed)
Ok. Maybe someone has availability tomorrow as we opened up the staff meeting slots?

## 2020-03-27 NOTE — Telephone Encounter (Signed)
Patient was on schedule to see Sandra Brown on 03/29/20 with concern of blood clot in leg. Telephone encounter was made on 03/23/20 and sent to Sandra Brown stating as below "Received a fax from [facility] about swollen, red and tender lower leg and request for abx. Patient will need to be evaluated to r/o other causes of this issue, including blood clot. She can do virtual video if easier for her. " Patient has not ben evaluated for symptoms or triaged further, per Sandra Brown patient needs immediate medical attention and was advised to seek treatment at ED if she was unable to be seen today. I tried reaching back out to [facility] and Sandra Boys, RN is charge nurse over seeing Sandra Brown where patient is housed. Sandra Brown was not in office and I spoke with another nurse Sandra Conrad, RN who is from [facility], I advised her of the situation and advised that patient be taken to ED today. She Brown that she was unfamiliar with patient, Sandra Brown that she will have to go to patients home to assess and evaluate her first. She will contact office back after she evaluates patient further. KW

## 2020-03-29 ENCOUNTER — Other Ambulatory Visit: Payer: Self-pay

## 2020-03-29 ENCOUNTER — Other Ambulatory Visit: Payer: Self-pay | Admitting: Physician Assistant

## 2020-03-29 ENCOUNTER — Ambulatory Visit (INDEPENDENT_AMBULATORY_CARE_PROVIDER_SITE_OTHER): Payer: Medicare Other | Admitting: Adult Health

## 2020-03-29 ENCOUNTER — Encounter: Payer: Self-pay | Admitting: Adult Health

## 2020-03-29 VITALS — BP 117/62 | HR 90 | Temp 98.1°F | Resp 15 | Wt 111.8 lb

## 2020-03-29 DIAGNOSIS — L03115 Cellulitis of right lower limb: Secondary | ICD-10-CM

## 2020-03-29 MED ORDER — DOXYCYCLINE HYCLATE 100 MG PO TABS: 100.0000 mg | ORAL_TABLET | Freq: Two times a day (BID) | ORAL | 0 refills | Status: DC

## 2020-03-29 NOTE — Telephone Encounter (Signed)
Requested medication (s) are due for refill today: yes  Requested medication (s) are on the active medication list: no  Last refill:  new RX   Future visit scheduled: no  Notes to clinic: new RX per Sharyn Lull Flinchum; no assigned protocol; pt requests different pharmacy    Requested Prescriptions  Pending Prescriptions Disp Refills   doxycycline (VIBRA-TABS) 100 MG tablet 20 tablet 0    Sig: Take 1 tablet (100 mg total) by mouth 2 (two) times daily.      Off-Protocol Failed - 03/29/2020  1:28 PM      Failed - Medication not assigned to a protocol, review manually.      Passed - Valid encounter within last 12 months    Recent Outpatient Visits           Today Cellulitis of right lower leg   [facility] [place], Kelby Aline, FNP   2 months ago Lumbar radiculopathy   [place], [place], [place]   3 months ago Right hip pain   [facility] Carles Collet M, [place]   5 months ago Gastroesophageal reflux disease, unspecified whether esophagitis present   [place], PA-C   6 months ago [facility], Dionne Bucy, MD

## 2020-03-29 NOTE — Progress Notes (Signed)
117/62     Established patient visit   [name]   DOB: 1926/06/24   87 y.o. Female  MRN: [id number] Visit Date: 03/29/2020  Today's healthcare provider: Marcille Buffy, FNP   Chief Complaint  Patient presents with  . Leg Swelling    Patient comes in office today with concerns of redness and swelling of her lower right leg for 10 days.Patient reports that skin is sensitive and warm to the touch she reports past history of cellulitis.    Subjective    HPI HPI    Leg Swelling     Additional comments: Patient comes in office today with concerns of redness and swelling of her lower right leg for 10 days.Patient reports that skin is sensitive and warm to the touch she reports past history of cellulitis.        Last edited by Minette Headland, CMA on 03/29/2020 11:21 AM. (History)     reviewed right leg doppler that was performed at [facility] on 03/28/20 that was negative for DVT.  Copy made to have scanned to chart.  She has a superficial wound of right lower leg and has warmth and tenderness as well as redness reported for last one week and not improving. Denies any pain.  She is accompanied by her daughter.    Patient  denies any fever, body aches,chills, rash, chest pain, shortness of breath, nausea, vomiting, or diarrhea.  Denies dizziness, lightheadedness, pre syncopal or syncopal episodes.      Medications: Outpatient Medications Prior to Visit  Medication Sig  . acetaminophen (TYLENOL 8 HOUR ARTHRITIS PAIN) 650 MG CR tablet Take 1 tablet (650 mg total) by mouth every 8 (eight) hours.  Marland Kitchen albuterol (VENTOLIN HFA) 108 (90 Base) MCG/ACT inhaler Inhale 2 puffs into the lungs every 6 (six) hours as needed for wheezing or shortness of breath.  . ALPRAZolam (XANAX) 0.25 MG tablet Take 0.25 mg by mouth at bedtime as needed for anxiety.  . AMBULATORY NON FORMULARY MEDICATION Medication Name: incentive spirometry Use as directed  . bismuth subsalicylate  (PEPTO BISMOL) 262 MG/15ML suspension Take 30 mLs by mouth as needed.  Marland Kitchen buPROPion (WELLBUTRIN SR) 150 MG 12 hr tablet Take 1 tablet (150 mg total) by mouth daily.  Marland Kitchen CALCIUM CITRATE PO Take 1 tablet by mouth in the morning and at bedtime.   . carbamide peroxide (DEBROX) 6.5 % OTIC solution Place 5 drops into both ears as needed.  . celecoxib (CELEBREX) 200 MG capsule Take 200 mg by mouth daily.  . cholestyramine (QUESTRAN) 4 g packet ADD 1 PACKET TO 8OZ OF FLUID AND DRINK 1-2 TIMES A DAY AS NEEDED LOOSE STOOLS  . Cyanocobalamin (VITAMIN B12) 500 MCG TABS Take by mouth.  . diphenoxylate-atropine (LOMOTIL) 2.5-0.025 MG tablet Take by mouth. Take 1 tablet by mouth 2 (two) times daily as needed for Diarrhea As need abdominal pain/diarrhea.  . Doxepin HCl 3 MG TABS   . fluticasone (FLONASE) 50 MCG/ACT nasal spray USE 2 SPRAYS NASALLY DAILY  . furosemide (LASIX) 40 MG tablet TAKE 1 TABLET DAILY  . guaifenesin (HUMIBID E) 400 MG TABS tablet Take 400 mg by mouth in the morning and at bedtime.  . hydrocortisone 1 % lotion Apply 1 application topically as needed for itching.  . hydrOXYzine (ATARAX/VISTARIL) 25 MG tablet Take 0.5-1 tablets (12.5-25 mg total) by mouth daily as needed. For skin picking, itching and anxiety attacks  . levocetirizine (XYZAL) 5 MG tablet TAKE 1 TABLET AT  BEDTIME EVERY NIGHT  . Lidocaine 4 % PTCH Use one patch daily.  Marland Kitchen loperamide (IMODIUM) 2 MG capsule Take 2 mg by mouth as needed for diarrhea or loose stools.  . magnesium hydroxide (MILK OF MAGNESIA) 400 MG/5ML suspension Take by mouth daily as needed for mild constipation. 2 tbsp  . magnesium oxide (MAG-OX) 400 MG tablet Take 400 mg by mouth daily.  . meclizine (ANTIVERT) 12.5 MG tablet Take 12.5 mg by mouth every 4 (four) hours as needed for dizziness.  . melatonin 5 MG TABS Take 5 mg by mouth at bedtime.  . mirtazapine (REMERON) 7.5 MG tablet Take 1 tablet (7.5 mg total) by mouth at bedtime.  . montelukast (SINGULAIR) 10  MG tablet   . Mouthwashes (MOUTHWASH/GARGLE MT) Use as directed in the mouth or throat. 1 tsp by mouth as needed for sore mouth and tongue  . Multiple Vitamin (MULTIVITAMIN) tablet Take 1 tablet by mouth daily.  Marland Kitchen nystatin (MYCOSTATIN) 100000 UNIT/ML suspension Take 5 mLs (500,000 Units total) by mouth 4 (four) times daily. Swish and swallow  . nystatin (MYCOSTATIN/NYSTOP) powder Apply 1 application topically 2 (two) times daily as needed.  . ondansetron (ZOFRAN) 4 MG tablet Take 4 mg by mouth as needed for nausea or vomiting.  . OXYGEN Inhale 2 L into the lungs continuous as needed.  . pantoprazole (PROTONIX) 20 MG tablet Take 1 tablet (20 mg total) by mouth 2 (two) times daily.  . Probiotic Product (PROBIOTIC COLON SUPPORT PO) Take 1 capsule by mouth daily.  Marland Kitchen senna (SENOKOT) 8.6 MG TABS tablet Take 1 tablet (8.6 mg total) by mouth at bedtime.  . simvastatin (ZOCOR) 20 MG tablet TAKE 1 TABLET DAILY  . SPIRIVA HANDIHALER 18 MCG inhalation capsule PLACE 1 CAPSULE INTO INHALER AND INHALE DAILY  . spironolactone (ALDACTONE) 25 MG tablet Take 1 tablet (25 mg total) by mouth daily.  . Suvorexant (BELSOMRA) 10 MG TABS   . SYNTHROID 50 MCG tablet TAKE 1 TABLET DAILY  . tamsulosin (FLOMAX) 0.4 MG CAPS capsule TAKE 1 CAPSULE DAILY  . triamcinolone (KENALOG) 0.1 %   . venlafaxine XR (EFFEXOR-XR) 75 MG 24 hr capsule TAKE 1 CAPSULE DAILY WITH BREAKFAST   No facility-administered medications prior to visit.    Review of Systems  Constitutional: Negative.   Respiratory: Negative.   Cardiovascular: Negative.   Genitourinary: Negative.   Skin: Positive for color change and wound.  Neurological: Negative.   Psychiatric/Behavioral: Negative.        Objective    BP 117/62   Pulse 90   Temp 98.1 F (36.7 C) (Oral)   Resp 15   Wt 111 lb 12.8 oz (50.7 kg)   SpO2 98%   BMI 21.83 kg/m  BP Readings from Last 3 Encounters:  03/29/20 117/62  01/19/20 (!) 115/43  10/06/19 (!) 108/58   Wt  Readings from Last 3 Encounters:  03/29/20 111 lb 12.8 oz (50.7 kg)  01/19/20 111 lb 9.6 oz (50.6 kg)  10/06/19 117 lb 3.2 oz (53.2 kg)       Physical Exam Vitals reviewed.  Constitutional:      General: She is not in acute distress.    Appearance: She is not ill-appearing or toxic-appearing.  Cardiovascular:     Rate and Rhythm: Normal rate and regular rhythm.     Pulses: Normal pulses.     Heart sounds: Normal heart sounds.  Pulmonary:     Effort: Pulmonary effort is normal. No respiratory distress.  Breath sounds: Normal breath sounds. No stridor. No wheezing, rhonchi or rales.  Chest:     Chest wall: No tenderness.  Abdominal:     Palpations: Abdomen is soft.  Skin:    Findings: Erythema (mild erythema and mild warmth. ) and wound present.       Psychiatric:        Mood and Affect: Mood normal.        Behavior: Behavior normal.        Thought Content: Thought content normal.        Judgment: Judgment normal.      No results found for any visits on 03/29/20.  Assessment & Plan     Cellulitis of right lower leg - Plan: doxycycline (VIBRA-TABS) 100 MG tablet, CBC with Differential/Platelet, Comprehensive Metabolic Panel (CMET)  Negative DVT ultyrasound.   Celluitis. Will treat with  Meds ordered this encounter  Medications  . doxycycline (VIBRA-TABS) 100 MG tablet    Sig: Take 1 tablet (100 mg total) by mouth 2 (two) times daily.    Dispense:  20 tablet    Refill:  0  given allergies to :  Allergies  Allergen Reactions  . Sulfa Antibiotics Rash and Itching    Other reaction(s): Diarrhea and vomiting (finding)  . Erythromycin Nausea And Vomiting    Other reaction(s): Diarrhea and vomiting (finding)  . Aspirin Other (See Comments), Tinitus and Nausea And Vomiting    Ringing of the ears, caused hearing loss both ears Ringing in ears  . Contrast Media [Iodinated Diagnostic Agents] Rash and Hives  . Latex Rash and Itching  . Penicillin G Rash  .  Penicillins Rash    Other reaction(s): UNKNOWN  . Tape Rash    Other reaction(s): UNKNOWN Adhesive Other reaction(s): UNKNOWN Adhesive  orders and doppler results copied by CMA and sent to have scanned in chart.   Orders Placed This Encounter  Procedures  . CBC with Differential/Platelet  . Comprehensive Metabolic Panel (CMET)    Red Flags discussed. The patient was given clear instructions to go to ER or return to medical center if any red flags develop, symptoms do not improve, worsen or new problems develop. They verbalized understanding.   Return in about 1 week (around 04/05/2020), or if symptoms worsen or fail to improve, for at any time for any worsening symptoms, Go to Emergency room/ urgent care if worse.      The entirety of the information documented in the History of Present Illness, Review of Systems and Physical Exam were personally obtained by me. Portions of this information were initially documented by the CMA and reviewed by me for thoroughness and accuracy.      Marcille Buffy, [place] [phone] (phone) [phone] (fax)  [place]

## 2020-03-29 NOTE — Telephone Encounter (Signed)
I called pt and pt's daughter [name] answered. [name] inquired about pt's doxycycline refill which has been refilled by [facility] as of earlier today after pt's OV. [name] was also inquiring about where pt was to get blood work and I informed her that she can get this done at [facility].

## 2020-03-29 NOTE — Patient Instructions (Signed)
Cellulitis, Adult  Cellulitis is a skin infection. The infected area is often warm, red, swollen, and sore. It occurs most often in the arms and lower legs. It is very important to get treated for this condition. What are the causes? This condition is caused by bacteria. The bacteria enter through a break in the skin, such as a cut, burn, insect bite, open sore, or crack. What increases the risk? This condition is more likely to occur in people who:  Have a weak body defense system (immune system).  Have open cuts, burns, bites, or scrapes on the skin.  Are older than 85 years of age.  Have a blood sugar problem (diabetes).  Have a long-lasting (chronic) liver disease (cirrhosis) or kidney disease.  Are very overweight (obese).  Have a skin problem, such as: ? Itchy rash (eczema). ? Slow movement of blood in the veins (venous stasis). ? Fluid buildup below the skin (edema).  Have been treated with high-energy rays (radiation).  Use IV drugs. What are the signs or symptoms? Symptoms of this condition include:  Skin that is: ? Red. ? Streaking. ? Spotting. ? Swollen. ? Sore or painful when you touch it. ? Warm.  A fever.  Chills.  Blisters. How is this diagnosed? This condition is diagnosed based on:  Medical history.  Physical exam.  Blood tests.  Imaging tests. How is this treated? Treatment for this condition may include:  Medicines to treat infections or allergies.  Home care, such as: ? Rest. ? Placing cold or warm cloths (compresses) on the skin.  Hospital care, if the condition is very bad. Follow these instructions at home: Medicines  Take over-the-counter and prescription medicines only as told by your doctor.  If you were prescribed an antibiotic medicine, take it as told by your doctor. Do not stop taking it even if you start to feel better. General instructions  Drink enough fluid to keep your pee (urine) pale yellow.  Do not touch  or rub the infected area.  Raise (elevate) the infected area above the level of your heart while you are sitting or lying down.  Place cold or warm cloths on the area as told by your doctor.  Keep all follow-up visits as told by your doctor. This is important.   Contact a doctor if:  You have a fever.  You do not start to get better after 1-2 days of treatment.  Your bone or joint under the infected area starts to hurt after the skin has healed.  Your infection comes back. This can happen in the same area or another area.  You have a swollen bump in the area.  You have new symptoms.  You feel ill and have muscle aches and pains. Get help right away if:  Your symptoms get worse.  You feel very sleepy.  You throw up (vomit) or have watery poop (diarrhea) for a long time.  You see red streaks coming from the area.  Your red area gets larger.  Your red area turns dark in color. These symptoms may represent a serious problem that is an emergency. Do not wait to see if the symptoms will go away. Get medical help right away. Call your local emergency services (911 in the [place] Do not drive yourself to the hospital. Summary  Cellulitis is a skin infection. The area is often warm, red, swollen, and sore.  This condition is treated with medicines, rest, and cold and warm cloths.  Take all medicines  only as told by your doctor.  Tell your doctor if symptoms do not start to get better after 1-2 days of treatment. This information is not intended to replace advice given to you by your health care provider. Make sure you discuss any questions you have with your health care provider. Document Revised: 05/15/2017 Document Reviewed: 05/15/2017 Elsevier Patient Education  2021 [place]. Doxycycline tablets or capsules What is this medicine? DOXYCYCLINE (dox i SYE kleen) is a tetracycline antibiotic. It kills certain bacteria or stops their growth. It is used to treat many kinds  of infections, like dental, skin, respiratory, and urinary tract infections. It also treats acne, Lyme disease, malaria, and certain sexually transmitted infections. This medicine may be used for other purposes; ask your health care provider or pharmacist if you have questions. COMMON BRAND NAME(S): Acticlate, Adoxa, Adoxa CK, Adoxa Pak, Adoxa TT, Alodox, Avidoxy, Doxal, LYMEPAK, Mondoxyne NL, Monodox, Morgidox 1x, Morgidox 1x Kit, Morgidox 2x, Morgidox 2x Kit, NutriDox, Ocudox, [place], [place], [place], Vibra-Tabs, Vibramycin What should I tell my health care provider before I take this medicine? They need to know if you have any of these conditions:  liver disease  long exposure to sunlight like working outdoors  stomach problems like colitis  an unusual or allergic reaction to doxycycline, tetracycline antibiotics, other medicines, foods, dyes, or preservatives  pregnant or trying to get pregnant  breast-feeding How should I use this medicine? Take this medicine by mouth with a full glass of water. Follow the directions on the prescription label. It is best to take this medicine without food, but if it upsets your stomach take it with food. Take your medicine at regular intervals. Do not take your medicine more often than directed. Take all of your medicine as directed even if you think you are better. Do not skip doses or stop your medicine early. Talk to your pediatrician regarding the use of this medicine in children. While this drug may be prescribed for selected conditions, precautions do apply. Overdosage: If you think you have taken too much of this medicine contact a poison control center or emergency room at once. NOTE: This medicine is only for you. Do not share this medicine with others. What if I miss a dose? If you miss a dose, take it as soon as you can. If it is almost time for your next dose, take only that dose. Do not take double or extra doses. What may interact with  this medicine?  antacids  barbiturates  birth control pills  bismuth subsalicylate  carbamazepine  methoxyflurane  other antibiotics  phenytoin  vitamins that contain iron  warfarin This list may not describe all possible interactions. Give your health care provider a list of all the medicines, herbs, non-prescription drugs, or dietary supplements you use. Also tell them if you smoke, drink alcohol, or use illegal drugs. Some items may interact with your medicine. What should I watch for while using this medicine? Tell your doctor or health care professional if your symptoms do not improve. Do not treat diarrhea with over the counter products. Contact your doctor if you have diarrhea that lasts more than 2 days or if it is severe and watery. Do not take this medicine just before going to bed. It may not dissolve properly when you lay down and can cause pain in your throat. Drink plenty of fluids while taking this medicine to also help reduce irritation in your throat. This medicine can make you more sensitive to the  sun. Keep out of the sun. If you cannot avoid being in the sun, wear protective clothing and use sunscreen. Do not use sun lamps or tanning beds/booths. Birth control pills may not work properly while you are taking this medicine. Talk to your doctor about using an extra method of birth control. If you are being treated for a sexually transmitted infection, avoid sexual contact until you have finished your treatment. Your sexual partner may also need treatment. Avoid antacids, aluminum, calcium, magnesium, and iron products for 4 hours before and 2 hours after taking a dose of this medicine. If you are using this medicine to prevent malaria, you should still protect yourself from contact with mosquitos. Stay in screened-in areas, use mosquito nets, keep your body covered, and use an insect repellent. What side effects may I notice from receiving this medicine? Side effects  that you should report to your doctor or health care professional as soon as possible:  allergic reactions like skin rash, itching or hives, swelling of the face, lips, or tongue  difficulty breathing  fever  itching in the rectal or genital area  pain on swallowing  rash, fever, and swollen lymph nodes  redness, blistering, peeling or loosening of the skin, including inside the mouth  severe stomach pain or cramps  unusual bleeding or bruising  unusually weak or tired  yellowing of the eyes or skin Side effects that usually do not require medical attention (report to your doctor or health care professional if they continue or are bothersome):  diarrhea  loss of appetite  nausea, vomiting This list may not describe all possible side effects. Call your doctor for medical advice about side effects. You may report side effects to FDA at 1-800-FDA-1088. Where should I keep my medicine? Keep out of the reach of children. Store at room temperature, below 30 degrees C (86 degrees F). Protect from light. Keep container tightly closed. Throw away any unused medicine after the expiration date. Taking this medicine after the expiration date can make you seriously ill. NOTE: This sheet is a summary. It may not cover all possible information. If you have questions about this medicine, talk to your doctor, pharmacist, or health care provider.  2021 Elsevier/Gold Standard (2018-03-26 13:44:53)

## 2020-03-29 NOTE — Telephone Encounter (Unsigned)
Copied from [place] [id number]. Topic: Quick Communication - Rx Refill/Question >> Mar 29, 2020  1:16 PM Lenon Curt, [place] A wrote: Medication: doxycycline (VIBRA-TABS) 100 MG tablet   Has the patient contacted their pharmacy? Patient has been recently issued this prescription and it has been submitted to the wrong pharmacy  Preferred Pharmacy (with phone number or street name): [place], [place] - [address]  Phone:  [phone]  Agent: Please be advised that RX refills may take up to 3 business days. We ask that you follow-up with your pharmacy.

## 2020-03-30 ENCOUNTER — Telehealth: Payer: Self-pay

## 2020-03-30 NOTE — Telephone Encounter (Signed)
Nurse has been advised. KW 

## 2020-03-30 NOTE — Telephone Encounter (Signed)
As far as I remember you saying yesterday patient can have these labs drawn at [facility] correct? KW

## 2020-03-30 NOTE — Telephone Encounter (Signed)
Copied from [place] [id number]. Topic: General - Other >> Mar 30, 2020 10:05 AM Lennox Solders wrote: Reason for CRM: audrey at [facility] is calling the patient brought back orders for blood work to be drawn and [name] would like to clarify they suppose to drawn

## 2020-03-30 NOTE — Telephone Encounter (Signed)
Yes [facility] can draw the ordered labs on form as our lab was already closed and patient and daughter preferred not to wait for lab to reopen.

## 2020-04-01 ENCOUNTER — Emergency Department: Payer: Medicare Other

## 2020-04-01 ENCOUNTER — Other Ambulatory Visit: Payer: Self-pay

## 2020-04-01 ENCOUNTER — Emergency Department
Admission: EM | Admit: 2020-04-01 | Discharge: 2020-04-01 | Disposition: A | Discharge status: Home / Self Care | Payer: Medicare Other | Attending: Emergency Medicine | Admitting: Emergency Medicine

## 2020-04-01 DIAGNOSIS — S8991XA Unspecified injury of right lower leg, initial encounter: Secondary | ICD-10-CM | POA: Diagnosis not present

## 2020-04-01 DIAGNOSIS — W19XXXA Unspecified fall, initial encounter: Secondary | ICD-10-CM

## 2020-04-01 DIAGNOSIS — J449 Chronic obstructive pulmonary disease, unspecified: Secondary | ICD-10-CM | POA: Insufficient documentation

## 2020-04-01 DIAGNOSIS — L0390 Cellulitis, unspecified: Secondary | ICD-10-CM | POA: Diagnosis not present

## 2020-04-01 DIAGNOSIS — R9431 Abnormal electrocardiogram [ECG] [EKG]: Secondary | ICD-10-CM | POA: Diagnosis not present

## 2020-04-01 DIAGNOSIS — I6782 Cerebral ischemia: Secondary | ICD-10-CM | POA: Diagnosis not present

## 2020-04-01 DIAGNOSIS — Y92002 Bathroom of unspecified non-institutional (private) residence single-family (private) house as the place of occurrence of the external cause: Secondary | ICD-10-CM | POA: Insufficient documentation

## 2020-04-01 DIAGNOSIS — Z96649 Presence of unspecified artificial hip joint: Secondary | ICD-10-CM | POA: Insufficient documentation

## 2020-04-01 DIAGNOSIS — S81811A Laceration without foreign body, right lower leg, initial encounter: Secondary | ICD-10-CM | POA: Diagnosis not present

## 2020-04-01 DIAGNOSIS — R519 Headache, unspecified: Secondary | ICD-10-CM | POA: Diagnosis not present

## 2020-04-01 DIAGNOSIS — N183 Chronic kidney disease, stage 3 unspecified: Secondary | ICD-10-CM | POA: Diagnosis not present

## 2020-04-01 DIAGNOSIS — Z79899 Other long term (current) drug therapy: Secondary | ICD-10-CM | POA: Diagnosis not present

## 2020-04-01 DIAGNOSIS — Z91040 Latex allergy status: Secondary | ICD-10-CM | POA: Diagnosis not present

## 2020-04-01 DIAGNOSIS — I129 Hypertensive chronic kidney disease with stage 1 through stage 4 chronic kidney disease, or unspecified chronic kidney disease: Secondary | ICD-10-CM | POA: Insufficient documentation

## 2020-04-01 DIAGNOSIS — W010XXA Fall on same level from slipping, tripping and stumbling without subsequent striking against object, initial encounter: Secondary | ICD-10-CM | POA: Insufficient documentation

## 2020-04-01 DIAGNOSIS — E039 Hypothyroidism, unspecified: Secondary | ICD-10-CM | POA: Diagnosis not present

## 2020-04-01 NOTE — ED Notes (Signed)
Pt assisted to the restroom, pt unable to urinate.

## 2020-04-01 NOTE — ED Notes (Signed)
Daughter on way to transport pt back home

## 2020-04-01 NOTE — Discharge Instructions (Addendum)
Return to the ER for worsening symptoms, persistent vomiting, difficulty breathing, lethargy or other concerns.

## 2020-04-01 NOTE — ED Triage Notes (Signed)
Pt was going to the bathroom, pt states she was trying to get something out of a cabinet and tripped. Pt has a skin tear to her right lower leg. Pt states no pain present.

## 2020-04-01 NOTE — ED Provider Notes (Signed)
[facility] Emergency Department Provider Note   ____________________________________________   Event Date/Time   First MD Initiated Contact with Patient 04/01/20 [phone]     (approximate)  I have reviewed the triage vital signs and the nursing notes.   HISTORY  Chief Complaint Leg Injury (Fall. Right leg injury, skin tear)    HPI Sandra Brown is a 87 y.o. female brought to the ED via EMS from SNF status post unwitnessed fall.  Patient states she tripped while going to the restroom and getting something out of a cabinet and fell.  Staff thinks she might have struck the back of her head.  Patient denies LOC.  Presents with skin tear to right lower leg.  Denies headache, vision changes, neck pain, chest pain, shortness of breath, abdominal pain, nausea, vomiting or dizziness.     Past Medical History:  Diagnosis Date  . Cataract   . COPD (chronic obstructive pulmonary disease) ([place])   . Depression   . Difficulty swallowing   . Frequent headaches   . Hearing loss   . Hypertension   . Hypothyroidism   . Reflux     Patient Active Problem List   Diagnosis Date Noted  . Cellulitis of right lower leg 03/29/2020  . MDD (major depressive disorder), recurrent, in full remission ([place]) 11/11/2019  . Panic attacks 03/16/2019  . MDD (major depressive disorder), recurrent episode, mild ([place]) 09/30/2018  . Insomnia due to mental condition 09/30/2018  . Skin-picking disorder 09/30/2018  . Lymphedema 01/27/2018  . Chronic venous insufficiency 01/27/2018  . Cellulitis of leg, left 12/23/2017  . Swelling of limb 12/23/2017  . CKD (chronic kidney disease) stage 3, GFR 30-59 ml/min (HCC) 10/02/2015  . Hypothyroidism 10/02/2015  . IBS (irritable bowel syndrome) 12/15/2014  . Allergic rhinitis 11/14/2014  . Facet syndrome, lumbar 09/21/2014  . Sacroiliac joint dysfunction 09/21/2014  . Hyperlipemia 09/01/2014  . Depression 08/29/2014  . Iron deficiency  anemia 07/14/2014  . GERD (gastroesophageal reflux disease) 07/14/2014  . COPD (chronic obstructive pulmonary disease) ([place]) 06/24/2014  . Incomplete bladder emptying 12/09/2012  . Urge incontinence 12/09/2012  . Obstruction of urinary tract 12/09/2012    Past Surgical History:  Procedure Laterality Date  . ABDOMINAL HYSTERECTOMY    . APPENDECTOMY    . LUMBAR LAMINECTOMY    . PARATHYROIDECTOMY    . TOTAL HIP ARTHROPLASTY     x 4    Prior to Admission medications   Medication Sig Start Date End Date Taking? Authorizing Provider  acetaminophen (TYLENOL 8 HOUR ARTHRITIS PAIN) 650 MG CR tablet Take 1 tablet (650 mg total) by mouth every 8 (eight) hours. 10/11/19 04/08/20  Trinna Post, PA-C  albuterol (VENTOLIN HFA) 108 (90 Base) MCG/ACT inhaler Inhale 2 puffs into the lungs every 6 (six) hours as needed for wheezing or shortness of breath. 07/30/18   Trinna Post, PA-C  ALPRAZolam [name]) 0.25 MG tablet Take 0.25 mg by mouth at bedtime as needed for anxiety.    [provider]  AMBULATORY NON FORMULARY MEDICATION Medication Name: incentive spirometry Use as directed 03/03/15   Vilinda Boehringer, MD  bismuth subsalicylate (PEPTO BISMOL) 262 MG/15ML suspension Take 30 mLs by mouth as needed.    [provider]  buPROPion (WELLBUTRIN SR) 150 MG 12 hr tablet Take 1 tablet (150 mg total) by mouth daily. 11/11/19   Ursula Alert, MD  CALCIUM CITRATE PO Take 1 tablet by mouth in the morning and at bedtime.  [provider]  carbamide peroxide (DEBROX) 6.5 % OTIC solution Place 5 drops into both ears as needed.    [provider]  celecoxib (CELEBREX) 200 MG capsule Take 200 mg by mouth daily.    [provider]  cholestyramine (QUESTRAN) 4 g packet ADD 1 PACKET TO 8OZ OF FLUID AND DRINK 1-2 TIMES A DAY AS NEEDED LOOSE STOOLS 01/13/18   Carmon Ginsberg, PA  Cyanocobalamin (VITAMIN B12) 500 MCG TABS Take by mouth.    [provider]   diphenoxylate-atropine (LOMOTIL) 2.5-0.025 MG tablet Take by mouth. Take 1 tablet by mouth 2 (two) times daily as needed for Diarrhea As need abdominal pain/diarrhea. 09/26/17   [provider]  Doxepin HCl 3 MG TABS  01/19/20   [provider]  doxycycline (VIBRA-TABS) 100 MG tablet Take 1 tablet (100 mg total) by mouth 2 (two) times daily. 03/29/20   Flinchum, Kelby Aline, FNP  fluticasone (FLONASE) 50 MCG/ACT nasal spray USE 2 SPRAYS NASALLY DAILY 01/23/20   Trinna Post, PA-C  furosemide (LASIX) 40 MG tablet TAKE 1 TABLET DAILY 01/24/20   Trinna Post, PA-C  guaifenesin (HUMIBID E) 400 MG TABS tablet Take 400 mg by mouth in the morning and at bedtime.    [provider]  hydrocortisone 1 % lotion Apply 1 application topically as needed for itching.    [provider]  hydrOXYzine (ATARAX/VISTARIL) 25 MG tablet Take 0.5-1 tablets (12.5-25 mg total) by mouth daily as needed. For skin picking, itching and anxiety attacks 07/07/19   Ursula Alert, MD  levocetirizine (XYZAL) 5 MG tablet TAKE 1 TABLET AT BEDTIME EVERY NIGHT 11/08/19   Trinna Post, PA-C  Lidocaine 4 % PTCH Use one patch daily. 10/08/19   Trinna Post, PA-C  loperamide (IMODIUM) 2 MG capsule Take 2 mg by mouth as needed for diarrhea or loose stools.    [provider]  magnesium hydroxide (MILK OF MAGNESIA) 400 MG/5ML suspension Take by mouth daily as needed for mild constipation. 2 tbsp    [provider]  magnesium oxide (MAG-OX) 400 MG tablet Take 400 mg by mouth daily.    [provider]  meclizine (ANTIVERT) 12.5 MG tablet Take 12.5 mg by mouth every 4 (four) hours as needed for dizziness.    [provider]  melatonin 5 MG TABS Take 5 mg by mouth at bedtime.    [provider]  mirtazapine (REMERON) 7.5 MG tablet Take 1 tablet (7.5 mg total) by mouth at bedtime. 02/17/20   Trinna Post, PA-C  montelukast (SINGULAIR) 10 MG tablet   12/22/18   [provider]  Mouthwashes (MOUTHWASH/GARGLE MT) Use as directed in the mouth or throat. 1 tsp by mouth as needed for sore mouth and tongue    [provider]  Multiple Vitamin (MULTIVITAMIN) tablet Take 1 tablet by mouth daily.    [provider]  nystatin (MYCOSTATIN) 100000 UNIT/ML suspension Take 5 mLs (500,000 Units total) by mouth 4 (four) times daily. Swish and swallow 09/10/19   Virginia Crews, MD  nystatin (MYCOSTATIN/NYSTOP) powder Apply 1 application topically 2 (two) times daily as needed.    [provider]  ondansetron (ZOFRAN) 4 MG tablet Take 4 mg by mouth as needed for nausea or vomiting.    [provider]  OXYGEN Inhale 2 L into the lungs continuous as needed.    [provider]  pantoprazole (PROTONIX) 20 MG tablet Take 1 tablet (20 mg total)  by mouth 2 (two) times daily. 10/08/19 04/05/20  Trinna Post, PA-C  Probiotic Product (PROBIOTIC COLON SUPPORT PO) Take 1 capsule by mouth daily.    [provider]  senna (SENOKOT) 8.6 MG TABS tablet Take 1 tablet (8.6 mg total) by mouth at bedtime. 10/08/19 04/05/20  Trinna Post, PA-C  simvastatin (ZOCOR) 20 MG tablet TAKE 1 TABLET DAILY 01/24/20   Trinna Post, PA-C  SPIRIVA HANDIHALER 18 MCG inhalation capsule PLACE 1 CAPSULE INTO INHALER AND INHALE DAILY 01/23/20   Trinna Post, PA-C  spironolactone (ALDACTONE) 25 MG tablet Take 1 tablet (25 mg total) by mouth daily. 03/02/19   Trinna Post, PA-C  Suvorexant (BELSOMRA) 10 MG TABS  01/06/19   [provider]  SYNTHROID 50 MCG tablet TAKE 1 TABLET DAILY 01/24/20   Trinna Post, PA-C  tamsulosin (FLOMAX) 0.4 MG CAPS capsule TAKE 1 CAPSULE DAILY 01/24/20   Trinna Post, PA-C  triamcinolone (KENALOG) 0.1 %  02/28/19   [provider]  venlafaxine XR (EFFEXOR-XR) 75 MG 24 hr capsule TAKE 1 CAPSULE DAILY WITH BREAKFAST 09/15/19   Ursula Alert, MD    Allergies Sulfa  antibiotics, Erythromycin, Aspirin, Contrast media [iodinated diagnostic agents], Latex, Penicillin g, Penicillins, and Tape  Family History  Problem Relation Age of Onset  . Stroke Mother   . Hypertension Mother   . Heart disease Father   . Hypertension Father     Social History Social History   Tobacco Use  . Smoking status: Never Smoker  . Smokeless tobacco: Never Used  . Tobacco comment: quit 1954  Vaping Use  . Vaping Use: Never used  Substance Use Topics  . Alcohol use: No  . Drug use: No    Review of Systems  Constitutional: Positive for fall.  No fever/chills Eyes: No visual changes. ENT: No sore throat. Cardiovascular: Denies chest pain. Respiratory: Denies shortness of breath. Gastrointestinal: No abdominal pain.  No nausea, no vomiting.  No diarrhea.  No constipation. Genitourinary: Negative for dysuria. Musculoskeletal: Negative for back pain. Skin: Positive for RLE skin tear.  Negative for rash. Neurological: Negative for headaches, focal weakness or numbness.   ____________________________________________   PHYSICAL EXAM:  VITAL SIGNS: ED Triage Vitals [04/01/20 0225]  Enc Vitals Group     BP (!) 144/56     Pulse Rate 84     Resp 18     Temp 97.6 F (36.4 C)     Temp Source Oral     SpO2 98 %     Weight      Height      Head Circumference      Peak Flow      Pain Score      Pain Loc      Pain Edu?      Excl. in [place]?     Constitutional: Alert and oriented.  Elderly appearing and in no acute distress. Eyes: Conjunctivae are normal. PERRL. EOMI. Head: Atraumatic. Nose: Atraumatic. Mouth/Throat: Mucous membranes are mildly dry.  Edentulous. Neck: No stridor.  No cervical spine tenderness to palpation.  No step-offs or deformities noted. Cardiovascular: Normal rate, regular rhythm. Grossly normal heart sounds.  Good peripheral circulation. Respiratory: Normal respiratory effort.  No retractions. Lungs CTAB. Gastrointestinal: Soft and  nontender. No distention. No abdominal bruits. No CVA tenderness. Musculoskeletal: No lower extremity tenderness nor edema.  No joint effusions. Neurologic:  Normal speech and language. No gross focal neurologic deficits are appreciated.  Skin:  Skin is warm, dry and intact. No rash noted.  Small skin tear to lateral right lower leg without active bleeding. Psychiatric: Mood and affect are normal. Speech and behavior are normal.  ____________________________________________   LABS (all labs ordered are listed, but only abnormal results are displayed)  Labs Reviewed - No data to display ____________________________________________  EKG  ED ECG REPORT I, Karena Kinker J, the attending physician, personally viewed and interpreted this ECG.   Date: 04/01/2020  EKG Time: 0229  Rate: 79  Rhythm: normal EKG, normal sinus rhythm  Axis: RAD  Intervals:none  ST&T Change: Nonspecific  ____________________________________________  RADIOLOGY I, Pepper Kerrick J, personally viewed and evaluated these images (plain radiographs) as part of my medical decision making, as well as reviewing the written report by the radiologist.  ED MD interpretation: No ICH  Official radiology report(s): CT Head Wo Contrast  Result Date: 04/01/2020 CLINICAL DATA:  Recent fall with headaches, initial encounter EXAM: CT HEAD WITHOUT CONTRAST TECHNIQUE: Contiguous axial images were obtained from the base of the skull through the vertex without intravenous contrast. COMPARISON:  02/23/2009 FINDINGS: Brain: Chronic atrophic and ischemic changes are noted similar to that seen on the prior exam. No acute hemorrhage, acute infarction or space-occupying mass lesion is seen. Vascular: No hyperdense vessel or unexpected calcification. Skull: Normal. Negative for fracture or focal lesion. Sinuses/Orbits: No acute finding. Other: None. IMPRESSION: Chronic atrophic and ischemic changes without acute abnormality. Electronically Signed    By: Inez Catalina M.D.   On: 04/01/2020 02:48    ____________________________________________   PROCEDURES  Procedure(s) performed (including Critical Care):  Procedures   ____________________________________________   INITIAL IMPRESSION / ASSESSMENT AND PLAN / ED COURSE  As part of my medical decision making, I reviewed the following data within the [facility] notes reviewed and incorporated, Labs reviewed, EKG interpreted, Old chart reviewed, Radiograph reviewed and Notes from prior ED visits     87 year old female brought to the ED from SNF status post mechanical fall with right leg skin tear.  Will obtain CT head,obtain EKG, urinalysis.  Wound care to skin tear.   ____________________________________________   FINAL CLINICAL IMPRESSION(S) / ED DIAGNOSES  Final diagnoses:  Fall, initial encounter  Skin tear of right lower leg without complication, initial encounter     ED Discharge Orders    None      *Please note:  EMARIE PAUL was evaluated in Emergency Department on 04/01/2020 for the symptoms described in the history of present illness. She was evaluated in the context of the global COVID-19 pandemic, which necessitated consideration that the patient might be at risk for infection with the SARS-CoV-2 virus that causes COVID-19. Institutional protocols and algorithms that pertain to the evaluation of patients at risk for COVID-19 are in a state of rapid change based on information released by regulatory bodies including the CDC and federal and state organizations. These policies and algorithms were followed during the patient's care in the ED.  Some ED evaluations and interventions may be delayed as a result of limited staffing during and the pandemic.*   Note:  This document was prepared using Dragon voice recognition software and may include unintentional dictation errors.   Paulette Blanch, MD 04/01/20 [phone]

## 2020-04-03 DIAGNOSIS — L03115 Cellulitis of right lower limb: Secondary | ICD-10-CM | POA: Diagnosis not present

## 2020-04-04 ENCOUNTER — Other Ambulatory Visit: Payer: Self-pay | Admitting: Physician Assistant

## 2020-04-04 NOTE — Telephone Encounter (Signed)
Requested medication (s) are due for refill today: no  Requested medication (s) are on the active medication list: yes  Future visit scheduled: non  Notes to clinic: this script has expired  Review for continued use and refill    Requested Prescriptions  Pending Prescriptions Disp Refills   diphenoxylate-atropine (LOMOTIL) 2.5-0.025 MG tablet [Pharmacy Med Name: DIPHENOXYLATE/ATROPINE 2.5MG  TABS] 90 tablet     Sig: TAKE 2 TABLETS BY MOUTH FOUR TIMES DAILY AS NEEDED FOR DIARRHEA      Not Delegated - Gastroenterology:  Antidiarrheals Failed - 04/04/2020 10:57 AM      Failed - This refill cannot be delegated      Passed - Valid encounter within last 12 months    Recent Outpatient Visits           6 days ago Cellulitis of right lower leg   [facility] [place], Kelby Aline, FNP   2 months ago Lumbar radiculopathy   [place], [place], [place]   3 months ago Right hip pain   [place], [place]   6 months ago Gastroesophageal reflux disease, unspecified whether esophagitis present   [place], PA-C   6 months ago [facility], Dionne Bucy, MD

## 2020-04-17 DIAGNOSIS — L821 Other seborrheic keratosis: Secondary | ICD-10-CM | POA: Diagnosis not present

## 2020-04-17 DIAGNOSIS — L281 Prurigo nodularis: Secondary | ICD-10-CM | POA: Diagnosis not present

## 2020-04-17 DIAGNOSIS — L738 Other specified follicular disorders: Secondary | ICD-10-CM | POA: Diagnosis not present

## 2020-04-17 DIAGNOSIS — D2261 Melanocytic nevi of right upper limb, including shoulder: Secondary | ICD-10-CM | POA: Diagnosis not present

## 2020-04-17 DIAGNOSIS — D2272 Melanocytic nevi of left lower limb, including hip: Secondary | ICD-10-CM | POA: Diagnosis not present

## 2020-04-17 DIAGNOSIS — L408 Other psoriasis: Secondary | ICD-10-CM | POA: Diagnosis not present

## 2020-04-17 DIAGNOSIS — D2271 Melanocytic nevi of right lower limb, including hip: Secondary | ICD-10-CM | POA: Diagnosis not present

## 2020-05-01 ENCOUNTER — Telehealth: Payer: Self-pay | Admitting: Family Medicine

## 2020-05-01 DIAGNOSIS — K219 Gastro-esophageal reflux disease without esophagitis: Secondary | ICD-10-CM

## 2020-05-01 MED ORDER — PANTOPRAZOLE SODIUM 20 MG PO TBEC
20.0000 mg | DELAYED_RELEASE_TABLET | Freq: Two times a day (BID) | ORAL | 0 refills | Status: DC
Start: 2020-05-01 — End: 2020-11-06

## 2020-05-01 NOTE — Telephone Encounter (Signed)
[facility] faxed refill request for the following medications:  pantoprazole (PROTONIX) 20 MG tablet  Last Rx: 10/08/19 90 day supply with 1 refill LOV: 03/29/20 Fl[place]um Please advise. Thanks TNP

## 2020-05-12 ENCOUNTER — Ambulatory Visit (INDEPENDENT_AMBULATORY_CARE_PROVIDER_SITE_OTHER): Payer: Medicare Other | Admitting: Family Medicine

## 2020-05-12 ENCOUNTER — Ambulatory Visit
Admission: RE | Admit: 2020-05-12 | Discharge: 2020-05-12 | Disposition: A | Discharge status: Home / Self Care | Payer: Medicare Other | Attending: Family Medicine | Admitting: Family Medicine

## 2020-05-12 ENCOUNTER — Ambulatory Visit
Admission: RE | Admit: 2020-05-12 | Discharge: 2020-05-12 | Disposition: A | Discharge status: Home / Self Care | Payer: Medicare Other | Source: Ambulatory Visit | Attending: Family Medicine | Admitting: Family Medicine

## 2020-05-12 ENCOUNTER — Encounter: Payer: Self-pay | Admitting: Family Medicine

## 2020-05-12 ENCOUNTER — Other Ambulatory Visit: Payer: Self-pay

## 2020-05-12 VITALS — BP 71/58 | HR 104 | Temp 99.5°F | Ht 60.0 in | Wt 102.0 lb

## 2020-05-12 DIAGNOSIS — R059 Cough, unspecified: Secondary | ICD-10-CM | POA: Diagnosis not present

## 2020-05-12 DIAGNOSIS — J441 Chronic obstructive pulmonary disease with (acute) exacerbation: Secondary | ICD-10-CM

## 2020-05-12 DIAGNOSIS — K449 Diaphragmatic hernia without obstruction or gangrene: Secondary | ICD-10-CM | POA: Diagnosis not present

## 2020-05-12 MED ORDER — DOXYCYCLINE HYCLATE 100 MG PO TABS: 100.0000 mg | ORAL_TABLET | Freq: Two times a day (BID) | ORAL | 0 refills | Status: DC

## 2020-05-12 NOTE — Progress Notes (Signed)
Established patient visit   [name]   DOB: 11-04-1926   87 y.o. Female  MRN: [id number] Visit Date: 05/12/2020  Today's healthcare provider: Vernie Murders, PA-C   No chief complaint on file.  Subjective    HPI  COPD, Follow up  She was last seen for this 8 months ago. Changes made include; seen by Royetta Crochet. On Spiriva.   She reports fair compliance with treatment. She is not having side effects.  she uses rescue inhaler 3 per days. She IS experiencing cough, wheezing, and fatigue. She is NOT experiencing fever. she reports breathing is Worse.  Pulmonary Functions Testing Results:  No results found for: FEV1, FVC, FEV1FVC, TLC  -----------------------------------------------------------------------------------------    Past Medical History:  Diagnosis Date   Cataract    COPD (chronic obstructive pulmonary disease) (HCC)    Depression    Difficulty swallowing    Frequent headaches    Hearing loss    Hypertension    Hypothyroidism    Reflux    Past Surgical History:  Procedure Laterality Date   ABDOMINAL HYSTERECTOMY     APPENDECTOMY     LUMBAR LAMINECTOMY     PARATHYROIDECTOMY     TOTAL HIP ARTHROPLASTY     x 4   Family Status  Relation Name Status   Mother  Deceased at age 55   Father  Deceased at age 8       MI   Allergies  Allergen Reactions   Sulfa Antibiotics Rash and Itching    Other reaction(s): Diarrhea and vomiting (finding)   Erythromycin Nausea And Vomiting    Other reaction(s): Diarrhea and vomiting (finding)   Aspirin Other (See Comments), Tinitus and Nausea And Vomiting    Ringing of the ears, caused hearing loss both ears Ringing in ears   Contrast Media [Iodinated Diagnostic Agents] Rash and Hives   Latex Rash and Itching   Penicillin G Rash   Penicillins Rash    Other reaction(s): UNKNOWN   Tape Rash    Other reaction(s): UNKNOWN Adhesive Other reaction(s): UNKNOWN Adhesive     Medications: Outpatient Medications Prior to Visit  Medication Sig   diphenoxylate-atropine (LOMOTIL) 2.5-0.025 MG tablet TAKE 2 TABLETS BY MOUTH FOUR TIMES DAILY AS NEEDED FOR DIARRHEA   albuterol (VENTOLIN HFA) 108 (90 Base) MCG/ACT inhaler Inhale 2 puffs into the lungs every 6 (six) hours as needed for wheezing or shortness of breath.   ALPRAZolam (XANAX) 0.25 MG tablet Take 0.25 mg by mouth at bedtime as needed for anxiety.   AMBULATORY NON FORMULARY MEDICATION Medication Name: incentive spirometry Use as directed   bismuth subsalicylate (PEPTO BISMOL) 262 MG/15ML suspension Take 30 mLs by mouth as needed.   buPROPion (WELLBUTRIN SR) 150 MG 12 hr tablet Take 1 tablet (150 mg total) by mouth daily.   CALCIUM CITRATE PO Take 1 tablet by mouth in the morning and at bedtime.    carbamide peroxide (DEBROX) 6.5 % OTIC solution Place 5 drops into both ears as needed.   celecoxib (CELEBREX) 200 MG capsule Take 200 mg by mouth daily.   cholestyramine (QUESTRAN) 4 g packet ADD 1 PACKET TO 8OZ OF FLUID AND DRINK 1-2 TIMES A DAY AS NEEDED LOOSE STOOLS   Cyanocobalamin (VITAMIN B12) 500 MCG TABS Take by mouth.   Doxepin HCl 3 MG TABS    doxycycline (VIBRA-TABS) 100 MG tablet Take 1 tablet (100 mg total) by mouth 2 (two) times daily.   fluticasone (FLONASE)  50 MCG/ACT nasal spray USE 2 SPRAYS NASALLY DAILY   furosemide (LASIX) 40 MG tablet TAKE 1 TABLET DAILY   guaifenesin (HUMIBID E) 400 MG TABS tablet Take 400 mg by mouth in the morning and at bedtime.   hydrocortisone 1 % lotion Apply 1 application topically as needed for itching.   hydrOXYzine (ATARAX/VISTARIL) 25 MG tablet Take 0.5-1 tablets (12.5-25 mg total) by mouth daily as needed. For skin picking, itching and anxiety attacks   levocetirizine (XYZAL) 5 MG tablet TAKE 1 TABLET AT BEDTIME EVERY NIGHT   Lidocaine 4 % PTCH Use one patch daily.   loperamide (IMODIUM) 2 MG capsule Take 2 mg by mouth as needed for diarrhea or loose stools.    magnesium hydroxide (MILK OF MAGNESIA) 400 MG/5ML suspension Take by mouth daily as needed for mild constipation. 2 tbsp   magnesium oxide (MAG-OX) 400 MG tablet Take 400 mg by mouth daily.   meclizine (ANTIVERT) 12.5 MG tablet Take 12.5 mg by mouth every 4 (four) hours as needed for dizziness.   melatonin 5 MG TABS Take 5 mg by mouth at bedtime.   mirtazapine (REMERON) 7.5 MG tablet Take 1 tablet (7.5 mg total) by mouth at bedtime.   montelukast (SINGULAIR) 10 MG tablet    Mouthwashes (MOUTHWASH/GARGLE MT) Use as directed in the mouth or throat. 1 tsp by mouth as needed for sore mouth and tongue   Multiple Vitamin (MULTIVITAMIN) tablet Take 1 tablet by mouth daily.   nystatin (MYCOSTATIN) 100000 UNIT/ML suspension Take 5 mLs (500,000 Units total) by mouth 4 (four) times daily. Swish and swallow   nystatin (MYCOSTATIN/NYSTOP) powder Apply 1 application topically 2 (two) times daily as needed.   ondansetron (ZOFRAN) 4 MG tablet Take 4 mg by mouth as needed for nausea or vomiting.   OXYGEN Inhale 2 L into the lungs continuous as needed.   pantoprazole (PROTONIX) 20 MG tablet Take 1 tablet (20 mg total) by mouth 2 (two) times daily.   Probiotic Product (PROBIOTIC COLON SUPPORT PO) Take 1 capsule by mouth daily.   simvastatin (ZOCOR) 20 MG tablet TAKE 1 TABLET DAILY   SPIRIVA HANDIHALER 18 MCG inhalation capsule PLACE 1 CAPSULE INTO INHALER AND INHALE DAILY   spironolactone (ALDACTONE) 25 MG tablet Take 1 tablet (25 mg total) by mouth daily.   Suvorexant (BELSOMRA) 10 MG TABS    SYNTHROID 50 MCG tablet TAKE 1 TABLET DAILY   tamsulosin (FLOMAX) 0.4 MG CAPS capsule TAKE 1 CAPSULE DAILY   triamcinolone (KENALOG) 0.1 %    venlafaxine XR (EFFEXOR-XR) 75 MG 24 hr capsule TAKE 1 CAPSULE DAILY WITH BREAKFAST   No facility-administered medications prior to visit.    Review of Systems  Constitutional: Negative for appetite change, chills, fatigue and fever.  Respiratory: Negative for chest tightness  and shortness of breath.   Cardiovascular: Negative for chest pain and palpitations.  Gastrointestinal: Negative for abdominal pain, nausea and vomiting.  Neurological: Negative for dizziness and weakness.       Objective    BP (!) 71/58   Pulse (!) 104   Temp 99.5 F (37.5 C) (Oral)   Ht 5' (1.524 m)   Wt 102 lb (46.3 kg)   SpO2 91%   BMI 19.92 kg/m     Physical Exam Constitutional:      General: She is not in acute distress.    Appearance: She is well-developed.  HENT:     Head: Normocephalic and atraumatic.     Right Ear: Hearing and  tympanic membrane normal.     Left Ear: Hearing and tympanic membrane normal.     Nose: Nose normal.  Eyes:     General: Lids are normal. No scleral icterus.       Right eye: No discharge.        Left eye: No discharge.     Conjunctiva/sclera: Conjunctivae normal.  Cardiovascular:     Rate and Rhythm: Normal rate.     Heart sounds: Normal heart sounds.  Pulmonary:     Effort: Pulmonary effort is normal.     Breath sounds: No wheezing.     Comments: Slight rough breath sounds. Questionable rhonchi. Abdominal:     General: Bowel sounds are normal.  Musculoskeletal:        General: Normal range of motion.     Cervical back: Neck supple.  Skin:    Findings: No lesion or rash.  Neurological:     Mental Status: She is alert and oriented to person, place, and time.  Psychiatric:        Speech: Speech normal.        Behavior: Behavior normal.        Thought Content: Thought content normal.       No results found for any visits on 05/12/20.  Assessment & Plan     1. COPD with exacerbation ([place]) Recent worsening of congestion and dyspnea. Rhonchi present on auscultation. Fatigues easily and will check CXR to rule out pneumonia. Given Doxycycline for acute exacerbation of COPD versus CAP. If worsening dyspnea, should consider going to the ER for nebulizer treatment. - doxycycline (VIBRA-TABS) 100 MG tablet; Take 1 tablet (100 mg  total) by mouth 2 (two) times daily.  Dispense: 20 tablet; Refill: 0 - DG Chest 2 View   No follow-ups on file.      I, Lilliane Sposito, PA-C, have reviewed all documentation for this visit. The documentation on 05/12/20 for the exam, diagnosis, procedures, and orders are all accurate and complete.    Vernie Murders, PA-C  [employer] [phone] (phone) [phone] (fax)  [place]

## 2020-05-13 DIAGNOSIS — R293 Abnormal posture: Secondary | ICD-10-CM | POA: Diagnosis not present

## 2020-05-13 DIAGNOSIS — N189 Chronic kidney disease, unspecified: Secondary | ICD-10-CM | POA: Diagnosis not present

## 2020-05-13 DIAGNOSIS — J209 Acute bronchitis, unspecified: Secondary | ICD-10-CM | POA: Diagnosis not present

## 2020-05-13 DIAGNOSIS — J441 Chronic obstructive pulmonary disease with (acute) exacerbation: Secondary | ICD-10-CM | POA: Diagnosis not present

## 2020-05-13 DIAGNOSIS — R278 Other lack of coordination: Secondary | ICD-10-CM | POA: Diagnosis not present

## 2020-05-13 DIAGNOSIS — M6281 Muscle weakness (generalized): Secondary | ICD-10-CM | POA: Diagnosis not present

## 2020-05-13 DIAGNOSIS — Z741 Need for assistance with personal care: Secondary | ICD-10-CM | POA: Diagnosis not present

## 2020-05-13 DIAGNOSIS — R2689 Other abnormalities of gait and mobility: Secondary | ICD-10-CM | POA: Diagnosis not present

## 2020-05-13 DIAGNOSIS — R4189 Other symptoms and signs involving cognitive functions and awareness: Secondary | ICD-10-CM | POA: Diagnosis not present

## 2020-05-13 DIAGNOSIS — M5441 Lumbago with sciatica, right side: Secondary | ICD-10-CM | POA: Diagnosis not present

## 2020-05-15 DIAGNOSIS — F39 Unspecified mood [affective] disorder: Secondary | ICD-10-CM | POA: Diagnosis not present

## 2020-05-15 DIAGNOSIS — K219 Gastro-esophageal reflux disease without esophagitis: Secondary | ICD-10-CM | POA: Diagnosis not present

## 2020-05-15 DIAGNOSIS — D649 Anemia, unspecified: Secondary | ICD-10-CM | POA: Diagnosis not present

## 2020-05-15 DIAGNOSIS — E441 Mild protein-calorie malnutrition: Secondary | ICD-10-CM | POA: Diagnosis not present

## 2020-05-15 DIAGNOSIS — E119 Type 2 diabetes mellitus without complications: Secondary | ICD-10-CM | POA: Diagnosis not present

## 2020-05-15 DIAGNOSIS — I872 Venous insufficiency (chronic) (peripheral): Secondary | ICD-10-CM | POA: Diagnosis not present

## 2020-05-15 DIAGNOSIS — E039 Hypothyroidism, unspecified: Secondary | ICD-10-CM | POA: Diagnosis not present

## 2020-05-15 DIAGNOSIS — J441 Chronic obstructive pulmonary disease with (acute) exacerbation: Secondary | ICD-10-CM | POA: Diagnosis not present

## 2020-05-16 DIAGNOSIS — M6281 Muscle weakness (generalized): Secondary | ICD-10-CM | POA: Diagnosis not present

## 2020-05-16 DIAGNOSIS — J441 Chronic obstructive pulmonary disease with (acute) exacerbation: Secondary | ICD-10-CM | POA: Diagnosis not present

## 2020-05-16 DIAGNOSIS — K829 Disease of gallbladder, unspecified: Secondary | ICD-10-CM | POA: Diagnosis not present

## 2020-05-16 DIAGNOSIS — N189 Chronic kidney disease, unspecified: Secondary | ICD-10-CM | POA: Diagnosis not present

## 2020-05-16 DIAGNOSIS — R7401 Elevation of levels of liver transaminase levels: Secondary | ICD-10-CM | POA: Diagnosis not present

## 2020-05-16 DIAGNOSIS — J209 Acute bronchitis, unspecified: Secondary | ICD-10-CM | POA: Diagnosis not present

## 2020-05-16 DIAGNOSIS — R293 Abnormal posture: Secondary | ICD-10-CM | POA: Diagnosis not present

## 2020-05-16 DIAGNOSIS — M5441 Lumbago with sciatica, right side: Secondary | ICD-10-CM | POA: Diagnosis not present

## 2020-05-17 ENCOUNTER — Telehealth: Payer: Medicare Other | Admitting: Psychiatry

## 2020-05-17 DIAGNOSIS — M6281 Muscle weakness (generalized): Secondary | ICD-10-CM | POA: Diagnosis not present

## 2020-05-17 DIAGNOSIS — M5441 Lumbago with sciatica, right side: Secondary | ICD-10-CM | POA: Diagnosis not present

## 2020-05-17 DIAGNOSIS — R293 Abnormal posture: Secondary | ICD-10-CM | POA: Diagnosis not present

## 2020-05-17 DIAGNOSIS — J441 Chronic obstructive pulmonary disease with (acute) exacerbation: Secondary | ICD-10-CM | POA: Diagnosis not present

## 2020-05-17 DIAGNOSIS — N189 Chronic kidney disease, unspecified: Secondary | ICD-10-CM | POA: Diagnosis not present

## 2020-05-17 DIAGNOSIS — J209 Acute bronchitis, unspecified: Secondary | ICD-10-CM | POA: Diagnosis not present

## 2020-05-18 ENCOUNTER — Ambulatory Visit: Payer: Medicare Other | Admitting: Podiatry

## 2020-05-18 ENCOUNTER — Telehealth: Payer: Self-pay

## 2020-05-18 DIAGNOSIS — J441 Chronic obstructive pulmonary disease with (acute) exacerbation: Secondary | ICD-10-CM | POA: Diagnosis not present

## 2020-05-18 DIAGNOSIS — J209 Acute bronchitis, unspecified: Secondary | ICD-10-CM | POA: Diagnosis not present

## 2020-05-18 DIAGNOSIS — M6281 Muscle weakness (generalized): Secondary | ICD-10-CM | POA: Diagnosis not present

## 2020-05-18 DIAGNOSIS — M5441 Lumbago with sciatica, right side: Secondary | ICD-10-CM | POA: Diagnosis not present

## 2020-05-18 DIAGNOSIS — R7401 Elevation of levels of liver transaminase levels: Secondary | ICD-10-CM | POA: Diagnosis not present

## 2020-05-18 DIAGNOSIS — R293 Abnormal posture: Secondary | ICD-10-CM | POA: Diagnosis not present

## 2020-05-18 DIAGNOSIS — N189 Chronic kidney disease, unspecified: Secondary | ICD-10-CM | POA: Diagnosis not present

## 2020-05-18 NOTE — Telephone Encounter (Signed)
I don't remember seeing anything. I have completed all of my paperwork, so it would be done if I had seen it

## 2020-05-18 NOTE — Telephone Encounter (Signed)
Have you received a fax or documentation on patient? KW

## 2020-05-18 NOTE — Telephone Encounter (Signed)
Copied from [place] [id number]. Topic: General - Other >> May 18, 2020  1:45 PM Oneta Rack wrote: Osvaldo Human name: Kendrick  Relation to pt: from [place] reference # [id number] Call back number: [phone]   Reason for call:  Clinical Review form faxing to [phone] please note when received.

## 2020-05-19 DIAGNOSIS — M6281 Muscle weakness (generalized): Secondary | ICD-10-CM | POA: Diagnosis not present

## 2020-05-19 DIAGNOSIS — J209 Acute bronchitis, unspecified: Secondary | ICD-10-CM | POA: Diagnosis not present

## 2020-05-19 DIAGNOSIS — N189 Chronic kidney disease, unspecified: Secondary | ICD-10-CM | POA: Diagnosis not present

## 2020-05-19 DIAGNOSIS — J441 Chronic obstructive pulmonary disease with (acute) exacerbation: Secondary | ICD-10-CM | POA: Diagnosis not present

## 2020-05-19 DIAGNOSIS — M5441 Lumbago with sciatica, right side: Secondary | ICD-10-CM | POA: Diagnosis not present

## 2020-05-19 DIAGNOSIS — R293 Abnormal posture: Secondary | ICD-10-CM | POA: Diagnosis not present

## 2020-05-20 DIAGNOSIS — R293 Abnormal posture: Secondary | ICD-10-CM | POA: Diagnosis not present

## 2020-05-20 DIAGNOSIS — J209 Acute bronchitis, unspecified: Secondary | ICD-10-CM | POA: Diagnosis not present

## 2020-05-20 DIAGNOSIS — M6281 Muscle weakness (generalized): Secondary | ICD-10-CM | POA: Diagnosis not present

## 2020-05-20 DIAGNOSIS — J441 Chronic obstructive pulmonary disease with (acute) exacerbation: Secondary | ICD-10-CM | POA: Diagnosis not present

## 2020-05-20 DIAGNOSIS — N189 Chronic kidney disease, unspecified: Secondary | ICD-10-CM | POA: Diagnosis not present

## 2020-05-20 DIAGNOSIS — M5441 Lumbago with sciatica, right side: Secondary | ICD-10-CM | POA: Diagnosis not present

## 2020-05-21 DIAGNOSIS — R293 Abnormal posture: Secondary | ICD-10-CM | POA: Diagnosis not present

## 2020-05-21 DIAGNOSIS — M6281 Muscle weakness (generalized): Secondary | ICD-10-CM | POA: Diagnosis not present

## 2020-05-21 DIAGNOSIS — J441 Chronic obstructive pulmonary disease with (acute) exacerbation: Secondary | ICD-10-CM | POA: Diagnosis not present

## 2020-05-21 DIAGNOSIS — N189 Chronic kidney disease, unspecified: Secondary | ICD-10-CM | POA: Diagnosis not present

## 2020-05-21 DIAGNOSIS — M5441 Lumbago with sciatica, right side: Secondary | ICD-10-CM | POA: Diagnosis not present

## 2020-05-21 DIAGNOSIS — J209 Acute bronchitis, unspecified: Secondary | ICD-10-CM | POA: Diagnosis not present

## 2020-05-22 DIAGNOSIS — N189 Chronic kidney disease, unspecified: Secondary | ICD-10-CM | POA: Diagnosis not present

## 2020-05-22 DIAGNOSIS — M6281 Muscle weakness (generalized): Secondary | ICD-10-CM | POA: Diagnosis not present

## 2020-05-22 DIAGNOSIS — M5441 Lumbago with sciatica, right side: Secondary | ICD-10-CM | POA: Diagnosis not present

## 2020-05-22 DIAGNOSIS — R293 Abnormal posture: Secondary | ICD-10-CM | POA: Diagnosis not present

## 2020-05-22 DIAGNOSIS — J441 Chronic obstructive pulmonary disease with (acute) exacerbation: Secondary | ICD-10-CM | POA: Diagnosis not present

## 2020-05-22 DIAGNOSIS — J209 Acute bronchitis, unspecified: Secondary | ICD-10-CM | POA: Diagnosis not present

## 2020-05-22 NOTE — Telephone Encounter (Signed)
Kendrick with [employer] called back will fax form over again need to confirm receipt please

## 2020-05-22 NOTE — Telephone Encounter (Signed)
Called [employer] and lmtcb or fax forms to [place].

## 2020-05-23 ENCOUNTER — Telehealth: Payer: Self-pay

## 2020-05-23 DIAGNOSIS — J441 Chronic obstructive pulmonary disease with (acute) exacerbation: Secondary | ICD-10-CM

## 2020-05-23 DIAGNOSIS — F331 Major depressive disorder, recurrent, moderate: Secondary | ICD-10-CM

## 2020-05-23 NOTE — Telephone Encounter (Signed)
[facility] sent forms to Dr. B requesting they be completed and faxed back. Forms place in provider's box and blank copy is on file with med recs. Please advise. Thanks TNP

## 2020-05-23 NOTE — Telephone Encounter (Signed)
Think that CCM RN would be ideal for functional assessment. If not that, then would need to see a provider in office to complete this as I have not seen the patient for any kind of chronic/preventive visit.

## 2020-05-23 NOTE — Telephone Encounter (Signed)
FYI. KW 

## 2020-05-24 DIAGNOSIS — N189 Chronic kidney disease, unspecified: Secondary | ICD-10-CM | POA: Diagnosis not present

## 2020-05-24 DIAGNOSIS — M5441 Lumbago with sciatica, right side: Secondary | ICD-10-CM | POA: Diagnosis not present

## 2020-05-24 DIAGNOSIS — J441 Chronic obstructive pulmonary disease with (acute) exacerbation: Secondary | ICD-10-CM | POA: Diagnosis not present

## 2020-05-24 DIAGNOSIS — M6281 Muscle weakness (generalized): Secondary | ICD-10-CM | POA: Diagnosis not present

## 2020-05-24 DIAGNOSIS — R293 Abnormal posture: Secondary | ICD-10-CM | POA: Diagnosis not present

## 2020-05-24 DIAGNOSIS — J209 Acute bronchitis, unspecified: Secondary | ICD-10-CM | POA: Diagnosis not present

## 2020-05-25 DIAGNOSIS — M5441 Lumbago with sciatica, right side: Secondary | ICD-10-CM | POA: Diagnosis not present

## 2020-05-25 DIAGNOSIS — N189 Chronic kidney disease, unspecified: Secondary | ICD-10-CM | POA: Diagnosis not present

## 2020-05-25 DIAGNOSIS — J441 Chronic obstructive pulmonary disease with (acute) exacerbation: Secondary | ICD-10-CM | POA: Diagnosis not present

## 2020-05-25 DIAGNOSIS — J209 Acute bronchitis, unspecified: Secondary | ICD-10-CM | POA: Diagnosis not present

## 2020-05-25 DIAGNOSIS — M6281 Muscle weakness (generalized): Secondary | ICD-10-CM | POA: Diagnosis not present

## 2020-05-25 DIAGNOSIS — R293 Abnormal posture: Secondary | ICD-10-CM | POA: Diagnosis not present

## 2020-05-25 NOTE — Telephone Encounter (Signed)
[facility], [place] on [employer].

## 2020-05-25 NOTE — Telephone Encounter (Signed)
Pts daughter Sandra Brown is calling [place] back Cb- [phone]

## 2020-05-26 ENCOUNTER — Telehealth: Payer: Self-pay | Admitting: *Deleted

## 2020-05-26 ENCOUNTER — Encounter: Payer: Self-pay | Admitting: Family Medicine

## 2020-05-26 DIAGNOSIS — Z23 Encounter for immunization: Secondary | ICD-10-CM | POA: Diagnosis not present

## 2020-05-26 NOTE — Telephone Encounter (Signed)
Sandra Brown agreed to [facility] referral.

## 2020-05-26 NOTE — Chronic Care Management (AMB) (Signed)
  Chronic Care Management   Note  05/26/2020 Name: Sandra Brown MRN: [id number] DOB: Feb 24, 1926  Sandra Brown is a 87 y.o. year old female who is a primary care patient of Brita Romp, Dionne Bucy, MD. I reached out to [name] by phone today in response to a referral sent by Ms. Kerrie [place]'s PCP, Brita Romp Dionne Bucy, MD.     Ms. Mcintire was given information about Chronic Care Management services today including:  1. CCM service includes personalized support from designated clinical staff supervised by her physician, including individualized plan of care and coordination with other care providers 2. 24/7 contact phone numbers for assistance for urgent and routine care needs. 3. Service will only be billed when office clinical staff spend 20 minutes or more in a month to coordinate care. 4. Only one practitioner may furnish and bill the service in a calendar month. 5. The patient may stop CCM services at any time (effective at the end of the month) by phone call to the office staff. 6. The patient will be responsible for cost sharing (co-pay) of up to 20% of the service fee (after annual deductible is met).  Patient agreed to services and verbal consent obtained.   Follow up plan: Telephone appointment with care management team member scheduled for:05/29/2020  [place] [facility]

## 2020-05-27 DIAGNOSIS — R293 Abnormal posture: Secondary | ICD-10-CM | POA: Diagnosis not present

## 2020-05-27 DIAGNOSIS — J209 Acute bronchitis, unspecified: Secondary | ICD-10-CM | POA: Diagnosis not present

## 2020-05-27 DIAGNOSIS — M5441 Lumbago with sciatica, right side: Secondary | ICD-10-CM | POA: Diagnosis not present

## 2020-05-27 DIAGNOSIS — M6281 Muscle weakness (generalized): Secondary | ICD-10-CM | POA: Diagnosis not present

## 2020-05-27 DIAGNOSIS — J441 Chronic obstructive pulmonary disease with (acute) exacerbation: Secondary | ICD-10-CM | POA: Diagnosis not present

## 2020-05-27 DIAGNOSIS — N189 Chronic kidney disease, unspecified: Secondary | ICD-10-CM | POA: Diagnosis not present

## 2020-05-28 ENCOUNTER — Telehealth: Payer: Self-pay | Admitting: Internal Medicine

## 2020-05-28 DIAGNOSIS — J209 Acute bronchitis, unspecified: Secondary | ICD-10-CM | POA: Diagnosis not present

## 2020-05-28 DIAGNOSIS — J441 Chronic obstructive pulmonary disease with (acute) exacerbation: Secondary | ICD-10-CM | POA: Diagnosis not present

## 2020-05-28 DIAGNOSIS — M6281 Muscle weakness (generalized): Secondary | ICD-10-CM | POA: Diagnosis not present

## 2020-05-28 DIAGNOSIS — N189 Chronic kidney disease, unspecified: Secondary | ICD-10-CM | POA: Diagnosis not present

## 2020-05-28 DIAGNOSIS — R293 Abnormal posture: Secondary | ICD-10-CM | POA: Diagnosis not present

## 2020-05-28 DIAGNOSIS — M5441 Lumbago with sciatica, right side: Secondary | ICD-10-CM | POA: Diagnosis not present

## 2020-05-28 DIAGNOSIS — R7401 Elevation of levels of liver transaminase levels: Secondary | ICD-10-CM

## 2020-05-28 NOTE — Telephone Encounter (Signed)
-----   Message from Lurlean Nanny, [place] sent at 05/26/2020  3:19 PM EDT ----- Regarding: GI Referral Pt is a resident of [place]... Daughter [name] called wanting to know who she need to talk to in reference to the GI referral that was supposed to be placed. They want the referral to [facility] wants to see Sandra Brown  Please forward to [facility] as I will no longer be in the office.

## 2020-05-29 ENCOUNTER — Telehealth: Payer: Medicare Other

## 2020-05-29 ENCOUNTER — Telehealth: Payer: Self-pay

## 2020-05-29 DIAGNOSIS — J441 Chronic obstructive pulmonary disease with (acute) exacerbation: Secondary | ICD-10-CM | POA: Diagnosis not present

## 2020-05-29 DIAGNOSIS — M6281 Muscle weakness (generalized): Secondary | ICD-10-CM | POA: Diagnosis not present

## 2020-05-29 DIAGNOSIS — R293 Abnormal posture: Secondary | ICD-10-CM | POA: Diagnosis not present

## 2020-05-29 DIAGNOSIS — J209 Acute bronchitis, unspecified: Secondary | ICD-10-CM | POA: Diagnosis not present

## 2020-05-29 DIAGNOSIS — M5441 Lumbago with sciatica, right side: Secondary | ICD-10-CM | POA: Diagnosis not present

## 2020-05-29 DIAGNOSIS — N189 Chronic kidney disease, unspecified: Secondary | ICD-10-CM | POA: Diagnosis not present

## 2020-05-29 NOTE — Telephone Encounter (Signed)
  Care Management   Follow Up Note   05/29/2020 Name: Sandra Brown MRN: [id number] DOB: 1926/03/17   Primary Care Provider: Virginia Crews, MD Reason for referral : Chronic Care Management   An unsuccessful telephone outreach was attempted today. [name] was referred to the case management team for assistance with care management and care coordination.    Follow Up Plan:  A HIPAA compliant voice message was left today requesting a return call. Will notify the Care Guide team regarding need to assist with rescheduling.    [facility] [phone]

## 2020-05-30 ENCOUNTER — Ambulatory Visit: Payer: Medicare Other

## 2020-05-30 ENCOUNTER — Emergency Department: Payer: Medicare Other

## 2020-05-30 ENCOUNTER — Inpatient Hospital Stay
Admit: 2020-05-30 | Discharge: 2020-05-30 | Disposition: A | Discharge status: Home / Self Care | Payer: Medicare Other | Attending: Internal Medicine | Admitting: Internal Medicine

## 2020-05-30 ENCOUNTER — Other Ambulatory Visit: Payer: Self-pay

## 2020-05-30 ENCOUNTER — Inpatient Hospital Stay
Admission: EM | Admit: 2020-05-30 | Discharge: 2020-06-03 | DRG: 193 | Disposition: A | Discharge status: Skilled Nursing Facility | Payer: Medicare Other | Source: Skilled Nursing Facility | Attending: Internal Medicine | Admitting: Internal Medicine

## 2020-05-30 DIAGNOSIS — Z79899 Other long term (current) drug therapy: Secondary | ICD-10-CM

## 2020-05-30 DIAGNOSIS — Z91041 Radiographic dye allergy status: Secondary | ICD-10-CM | POA: Diagnosis not present

## 2020-05-30 DIAGNOSIS — Z91040 Latex allergy status: Secondary | ICD-10-CM

## 2020-05-30 DIAGNOSIS — I10 Essential (primary) hypertension: Secondary | ICD-10-CM | POA: Diagnosis present

## 2020-05-30 DIAGNOSIS — Y95 Nosocomial condition: Secondary | ICD-10-CM | POA: Diagnosis present

## 2020-05-30 DIAGNOSIS — J9811 Atelectasis: Secondary | ICD-10-CM | POA: Diagnosis present

## 2020-05-30 DIAGNOSIS — R918 Other nonspecific abnormal finding of lung field: Secondary | ICD-10-CM | POA: Diagnosis not present

## 2020-05-30 DIAGNOSIS — Z6821 Body mass index (BMI) 21.0-21.9, adult: Secondary | ICD-10-CM | POA: Diagnosis not present

## 2020-05-30 DIAGNOSIS — J9621 Acute and chronic respiratory failure with hypoxia: Secondary | ICD-10-CM | POA: Diagnosis present

## 2020-05-30 DIAGNOSIS — E440 Moderate protein-calorie malnutrition: Secondary | ICD-10-CM | POA: Diagnosis present

## 2020-05-30 DIAGNOSIS — R5381 Other malaise: Secondary | ICD-10-CM | POA: Diagnosis not present

## 2020-05-30 DIAGNOSIS — J9611 Chronic respiratory failure with hypoxia: Secondary | ICD-10-CM

## 2020-05-30 DIAGNOSIS — R0689 Other abnormalities of breathing: Secondary | ICD-10-CM | POA: Diagnosis not present

## 2020-05-30 DIAGNOSIS — R600 Localized edema: Secondary | ICD-10-CM

## 2020-05-30 DIAGNOSIS — Z96649 Presence of unspecified artificial hip joint: Secondary | ICD-10-CM | POA: Diagnosis present

## 2020-05-30 DIAGNOSIS — E039 Hypothyroidism, unspecified: Secondary | ICD-10-CM | POA: Diagnosis not present

## 2020-05-30 DIAGNOSIS — I248 Other forms of acute ischemic heart disease: Secondary | ICD-10-CM | POA: Diagnosis present

## 2020-05-30 DIAGNOSIS — Z741 Need for assistance with personal care: Secondary | ICD-10-CM | POA: Diagnosis not present

## 2020-05-30 DIAGNOSIS — Z881 Allergy status to other antibiotic agents status: Secondary | ICD-10-CM | POA: Diagnosis not present

## 2020-05-30 DIAGNOSIS — J189 Pneumonia, unspecified organism: Principal | ICD-10-CM | POA: Diagnosis present

## 2020-05-30 DIAGNOSIS — K449 Diaphragmatic hernia without obstruction or gangrene: Secondary | ICD-10-CM | POA: Diagnosis not present

## 2020-05-30 DIAGNOSIS — Z882 Allergy status to sulfonamides status: Secondary | ICD-10-CM | POA: Diagnosis not present

## 2020-05-30 DIAGNOSIS — Z66 Do not resuscitate: Secondary | ICD-10-CM | POA: Diagnosis present

## 2020-05-30 DIAGNOSIS — J449 Chronic obstructive pulmonary disease, unspecified: Secondary | ICD-10-CM | POA: Diagnosis present

## 2020-05-30 DIAGNOSIS — Z8249 Family history of ischemic heart disease and other diseases of the circulatory system: Secondary | ICD-10-CM

## 2020-05-30 DIAGNOSIS — E785 Hyperlipidemia, unspecified: Secondary | ICD-10-CM | POA: Diagnosis not present

## 2020-05-30 DIAGNOSIS — R0902 Hypoxemia: Secondary | ICD-10-CM | POA: Diagnosis not present

## 2020-05-30 DIAGNOSIS — R2681 Unsteadiness on feet: Secondary | ICD-10-CM | POA: Diagnosis not present

## 2020-05-30 DIAGNOSIS — R000 Tachycardia, unspecified: Secondary | ICD-10-CM | POA: Diagnosis not present

## 2020-05-30 DIAGNOSIS — Z91048 Other nonmedicinal substance allergy status: Secondary | ICD-10-CM

## 2020-05-30 DIAGNOSIS — J440 Chronic obstructive pulmonary disease with acute lower respiratory infection: Secondary | ICD-10-CM | POA: Diagnosis present

## 2020-05-30 DIAGNOSIS — E441 Mild protein-calorie malnutrition: Secondary | ICD-10-CM | POA: Diagnosis not present

## 2020-05-30 DIAGNOSIS — K219 Gastro-esophageal reflux disease without esophagitis: Secondary | ICD-10-CM | POA: Diagnosis present

## 2020-05-30 DIAGNOSIS — J441 Chronic obstructive pulmonary disease with (acute) exacerbation: Secondary | ICD-10-CM | POA: Diagnosis present

## 2020-05-30 DIAGNOSIS — Z880 Allergy status to penicillin: Secondary | ICD-10-CM

## 2020-05-30 DIAGNOSIS — F32A Depression, unspecified: Secondary | ICD-10-CM | POA: Diagnosis present

## 2020-05-30 DIAGNOSIS — R293 Abnormal posture: Secondary | ICD-10-CM | POA: Diagnosis not present

## 2020-05-30 DIAGNOSIS — Z886 Allergy status to analgesic agent status: Secondary | ICD-10-CM

## 2020-05-30 DIAGNOSIS — H9190 Unspecified hearing loss, unspecified ear: Secondary | ICD-10-CM | POA: Diagnosis present

## 2020-05-30 DIAGNOSIS — N183 Chronic kidney disease, stage 3 unspecified: Secondary | ICD-10-CM | POA: Diagnosis present

## 2020-05-30 DIAGNOSIS — E119 Type 2 diabetes mellitus without complications: Secondary | ICD-10-CM | POA: Diagnosis present

## 2020-05-30 DIAGNOSIS — J209 Acute bronchitis, unspecified: Secondary | ICD-10-CM | POA: Diagnosis not present

## 2020-05-30 DIAGNOSIS — R069 Unspecified abnormalities of breathing: Secondary | ICD-10-CM | POA: Diagnosis not present

## 2020-05-30 DIAGNOSIS — D509 Iron deficiency anemia, unspecified: Secondary | ICD-10-CM

## 2020-05-30 DIAGNOSIS — Z20822 Contact with and (suspected) exposure to covid-19: Secondary | ICD-10-CM | POA: Diagnosis present

## 2020-05-30 DIAGNOSIS — R0602 Shortness of breath: Secondary | ICD-10-CM | POA: Diagnosis not present

## 2020-05-30 DIAGNOSIS — R2689 Other abnormalities of gait and mobility: Secondary | ICD-10-CM | POA: Diagnosis not present

## 2020-05-30 DIAGNOSIS — R4189 Other symptoms and signs involving cognitive functions and awareness: Secondary | ICD-10-CM | POA: Diagnosis not present

## 2020-05-30 DIAGNOSIS — M810 Age-related osteoporosis without current pathological fracture: Secondary | ICD-10-CM | POA: Diagnosis not present

## 2020-05-30 DIAGNOSIS — N1832 Chronic kidney disease, stage 3b: Secondary | ICD-10-CM | POA: Diagnosis not present

## 2020-05-30 DIAGNOSIS — R278 Other lack of coordination: Secondary | ICD-10-CM | POA: Diagnosis not present

## 2020-05-30 DIAGNOSIS — Z79890 Hormone replacement therapy (postmenopausal): Secondary | ICD-10-CM

## 2020-05-30 DIAGNOSIS — J9600 Acute respiratory failure, unspecified whether with hypoxia or hypercapnia: Secondary | ICD-10-CM | POA: Diagnosis present

## 2020-05-30 DIAGNOSIS — Z7401 Bed confinement status: Secondary | ICD-10-CM | POA: Diagnosis not present

## 2020-05-30 DIAGNOSIS — J90 Pleural effusion, not elsewhere classified: Secondary | ICD-10-CM | POA: Diagnosis not present

## 2020-05-30 DIAGNOSIS — M5441 Lumbago with sciatica, right side: Secondary | ICD-10-CM | POA: Diagnosis not present

## 2020-05-30 DIAGNOSIS — M6281 Muscle weakness (generalized): Secondary | ICD-10-CM | POA: Diagnosis not present

## 2020-05-30 DIAGNOSIS — D649 Anemia, unspecified: Secondary | ICD-10-CM | POA: Diagnosis not present

## 2020-05-30 DIAGNOSIS — M2550 Pain in unspecified joint: Secondary | ICD-10-CM | POA: Diagnosis not present

## 2020-05-30 LAB — CBC WITH DIFFERENTIAL/PLATELET
Abs Immature Granulocytes: 0.09 10*3/uL — ABNORMAL HIGH (ref 0.00–0.07)
Basophils Absolute: 0 10*3/uL (ref 0.0–0.1)
Basophils Relative: 0 %
Eosinophils Absolute: 0 10*3/uL (ref 0.0–0.5)
Eosinophils Relative: 0 %
HCT: 26.9 % — ABNORMAL LOW (ref 36.0–46.0)
Hemoglobin: 8.7 g/dL — ABNORMAL LOW (ref 12.0–15.0)
Immature Granulocytes: 1 %
Lymphocytes Relative: 3 %
Lymphs Abs: 0.5 10*3/uL — ABNORMAL LOW (ref 0.7–4.0)
MCH: 31.1 pg (ref 26.0–34.0)
MCHC: 32.3 g/dL (ref 30.0–36.0)
MCV: 96.1 fL (ref 80.0–100.0)
Monocytes Absolute: 0.7 10*3/uL (ref 0.1–1.0)
Monocytes Relative: 5 %
Neutro Abs: 13.6 10*3/uL — ABNORMAL HIGH (ref 1.7–7.7)
Neutrophils Relative %: 91 %
Platelets: 424 10*3/uL — ABNORMAL HIGH (ref 150–400)
RBC: 2.8 MIL/uL — ABNORMAL LOW (ref 3.87–5.11)
RDW: 15.9 % — ABNORMAL HIGH (ref 11.5–15.5)
WBC: 14.9 10*3/uL — ABNORMAL HIGH (ref 4.0–10.5)
nRBC: 0 % (ref 0.0–0.2)

## 2020-05-30 LAB — PROCALCITONIN: Procalcitonin: 0.13 ng/mL

## 2020-05-30 LAB — COMPREHENSIVE METABOLIC PANEL
ALT: 57 U/L — ABNORMAL HIGH (ref 0–44)
AST: 43 U/L — ABNORMAL HIGH (ref 15–41)
Albumin: 2.8 g/dL — ABNORMAL LOW (ref 3.5–5.0)
Alkaline Phosphatase: 81 U/L (ref 38–126)
Anion gap: 13 (ref 5–15)
BUN: 7 mg/dL — ABNORMAL LOW (ref 8–23)
CO2: 24 mmol/L (ref 22–32)
Calcium: 8.1 mg/dL — ABNORMAL LOW (ref 8.9–10.3)
Chloride: 97 mmol/L — ABNORMAL LOW (ref 98–111)
Creatinine, Ser: 0.83 mg/dL (ref 0.44–1.00)
GFR, Estimated: >60 mL/min (ref >60)
Glucose, Bld: 140 mg/dL — ABNORMAL HIGH (ref 70–99)
Potassium: 4.1 mmol/L (ref 3.5–5.1)
Sodium: 134 mmol/L — ABNORMAL LOW (ref 135–145)
Total Bilirubin: 0.8 mg/dL (ref 0.3–1.2)
Total Protein: 6.6 g/dL (ref 6.5–8.1)

## 2020-05-30 LAB — TROPONIN I (HIGH SENSITIVITY)
Troponin I (High Sensitivity): 50 ng/L — ABNORMAL HIGH (ref <18)
Troponin I (High Sensitivity): 50 ng/L — ABNORMAL HIGH (ref <18)

## 2020-05-30 LAB — RESP PANEL BY RT-PCR (FLU A&B, COVID) ARPGX2
Influenza A by PCR: NEGATIVE
Influenza B by PCR: NEGATIVE
SARS Coronavirus 2 by RT PCR: NEGATIVE

## 2020-05-30 LAB — LACTIC ACID, PLASMA: Lactic Acid, Venous: 0.5 mmol/L (ref 0.5–1.9)

## 2020-05-30 LAB — BRAIN NATRIURETIC PEPTIDE: B Natriuretic Peptide: 501.7 pg/mL — ABNORMAL HIGH (ref 0.0–100.0)

## 2020-05-30 MED ORDER — MORPHINE SULFATE (PF) 2 MG/ML IV SOLN: 0.5000 mg | INTRAVENOUS | Status: DC | PRN

## 2020-05-30 MED ORDER — ARFORMOTEROL TARTRATE 15 MCG/2ML IN NEBU
15.0000 ug | INHALATION_SOLUTION | Freq: Two times a day (BID) | RESPIRATORY_TRACT | Status: DC
  Administered 2020-05-30 – 2020-06-03 (×7): 15 ug via RESPIRATORY_TRACT
  Filled 2020-05-30 (×9): qty 2

## 2020-05-30 MED ORDER — SODIUM CHLORIDE 0.9 % IV SOLN
2.0000 g | Freq: Once | INTRAVENOUS | Status: AC
  Administered 2020-05-30: 2 g via INTRAVENOUS
  Filled 2020-05-30: qty 2

## 2020-05-30 MED ORDER — FUROSEMIDE 10 MG/ML IJ SOLN
20.0000 mg | Freq: Once | INTRAMUSCULAR | Status: AC
  Administered 2020-05-30: 20 mg via INTRAVENOUS
  Filled 2020-05-30: qty 4

## 2020-05-30 MED ORDER — BUDESONIDE 0.25 MG/2ML IN SUSP
0.2500 mg | Freq: Two times a day (BID) | RESPIRATORY_TRACT | Status: DC
  Administered 2020-05-30 – 2020-06-03 (×8): 0.25 mg via RESPIRATORY_TRACT
  Filled 2020-05-30 (×8): qty 2

## 2020-05-30 MED ORDER — POTASSIUM CHLORIDE CRYS ER 20 MEQ PO TBCR
20.0000 meq | EXTENDED_RELEASE_TABLET | Freq: Once | ORAL | Status: AC
  Administered 2020-05-30: 10 meq via ORAL
  Filled 2020-05-30: qty 1

## 2020-05-30 MED ORDER — IPRATROPIUM-ALBUTEROL 0.5-2.5 (3) MG/3ML IN SOLN
3.0000 mL | Freq: Four times a day (QID) | RESPIRATORY_TRACT | Status: DC
  Administered 2020-05-30 – 2020-05-31 (×4): 3 mL via RESPIRATORY_TRACT
  Filled 2020-05-30 (×4): qty 3

## 2020-05-30 MED ORDER — DILTIAZEM HCL 25 MG/5ML IV SOLN
5.0000 mg | Freq: Once | INTRAVENOUS | Status: AC
  Administered 2020-05-30: 5 mg via INTRAVENOUS

## 2020-05-30 MED ORDER — LACTATED RINGERS IV SOLN: INTRAVENOUS | Status: DC

## 2020-05-30 MED ORDER — LACTATED RINGERS IV BOLUS (SEPSIS)
1000.0000 mL | Freq: Once | INTRAVENOUS | Status: AC
  Administered 2020-05-30: 1000 mL via INTRAVENOUS

## 2020-05-30 MED ORDER — VANCOMYCIN HCL 500 MG/100ML IV SOLN
500.0000 mg | INTRAVENOUS | Status: DC
  Administered 2020-05-31: 500 mg via INTRAVENOUS
  Filled 2020-05-30 (×2): qty 100

## 2020-05-30 MED ORDER — METHYLPREDNISOLONE SODIUM SUCC 40 MG IJ SOLR
40.0000 mg | Freq: Two times a day (BID) | INTRAMUSCULAR | Status: DC
  Administered 2020-05-30 – 2020-06-03 (×8): 40 mg via INTRAVENOUS
  Filled 2020-05-30 (×8): qty 1

## 2020-05-30 MED ORDER — VANCOMYCIN HCL IN DEXTROSE 1-5 GM/200ML-% IV SOLN
1000.0000 mg | Freq: Once | INTRAVENOUS | Status: AC
  Administered 2020-05-30: 1000 mg via INTRAVENOUS
  Filled 2020-05-30: qty 200

## 2020-05-30 MED ORDER — GUAIFENESIN ER 600 MG PO TB12
600.0000 mg | ORAL_TABLET | Freq: Two times a day (BID) | ORAL | Status: DC
  Administered 2020-05-31 – 2020-06-03 (×7): 600 mg via ORAL
  Filled 2020-05-30 (×8): qty 1

## 2020-05-30 MED ORDER — SODIUM CHLORIDE 0.9 % IV SOLN
2.0000 g | Freq: Two times a day (BID) | INTRAVENOUS | Status: DC
  Administered 2020-05-31 – 2020-06-01 (×3): 2 g via INTRAVENOUS
  Filled 2020-05-30 (×4): qty 2

## 2020-05-30 NOTE — ED Provider Notes (Signed)
[facility] Emergency Department Provider Note   ____________________________________________   Event Date/Time   First MD Initiated Contact with Patient 05/30/20 1508     (approximate)  I have reviewed the triage vital signs and the nursing notes.   HISTORY  Chief Complaint Respiratory Distress    HPI Sandra Brown is a 87 y.o. female with below stated past medical history who presents via EMS from [facility] skilled nursing facility complaining of shortness of breath.  Patient does have a history of severe COPD on 2 L nasal cannula at baseline however EMS states that on arrival she had oxygen saturations in the mid 80s.  EMS gave 3 duo nebs in route and initially had a oxygen saturation of 90% on 2 L here in our emergency department.  Patient states that she has been feeling this shortness of breath "ever since I got to rehab".  Patient is oriented to self, situation, and place however she is disoriented to time stating that it was 2023.  Patient also endorses associated chest tightness.  Patient states that any exertion worsens the shortness of breath and her normal prescribed daily COPD medications improve her symptoms somewhat however they have been persistent.  Patient currently denies any vision changes, tinnitus, difficulty speaking, facial droop, sore throat, abdominal pain, nausea/vomiting/diarrhea, dysuria, or weakness/numbness/paresthesias in any extremity         Past Medical History:  Diagnosis Date  . Cataract   . COPD (chronic obstructive pulmonary disease) ([place])   . Depression   . Difficulty swallowing   . Frequent headaches   . Hearing loss   . Hypertension   . Hypothyroidism   . Reflux     Patient Active Problem List   Diagnosis Date Noted  . Cellulitis of right lower leg 03/29/2020  . MDD (major depressive disorder), recurrent, in full remission ([place]) 11/11/2019  . Panic attacks 03/16/2019  . MDD (major depressive  disorder), recurrent episode, mild ([place]) 09/30/2018  . Insomnia due to mental condition 09/30/2018  . Skin-picking disorder 09/30/2018  . Lymphedema 01/27/2018  . Chronic venous insufficiency 01/27/2018  . Cellulitis of leg, left 12/23/2017  . Swelling of limb 12/23/2017  . CKD (chronic kidney disease) stage 3, GFR 30-59 ml/min (HCC) 10/02/2015  . Hypothyroidism 10/02/2015  . IBS (irritable bowel syndrome) 12/15/2014  . Allergic rhinitis 11/14/2014  . Facet syndrome, lumbar 09/21/2014  . Sacroiliac joint dysfunction 09/21/2014  . Hyperlipemia 09/01/2014  . Depression 08/29/2014  . Iron deficiency anemia 07/14/2014  . GERD (gastroesophageal reflux disease) 07/14/2014  . COPD (chronic obstructive pulmonary disease) ([place]) 06/24/2014  . Incomplete bladder emptying 12/09/2012  . Urge incontinence 12/09/2012  . Obstruction of urinary tract 12/09/2012    Past Surgical History:  Procedure Laterality Date  . ABDOMINAL HYSTERECTOMY    . APPENDECTOMY    . LUMBAR LAMINECTOMY    . PARATHYROIDECTOMY    . TOTAL HIP ARTHROPLASTY     x 4    Prior to Admission medications   Medication Sig Start Date End Date Taking? Authorizing Provider  Dextromethorphan-guaiFENesin (DELSYM COUGH/CHEST CONGEST DM) 5-100 MG/5ML LIQD Take 5 mLs by mouth every 12 (twelve) hours as needed (cough).   Yes [provider]  fluticasone (FLONASE) 50 MCG/ACT nasal spray USE 2 SPRAYS NASALLY DAILY Patient taking differently: Place 2 sprays into both nostrils daily. USE 2 SPRAYS NASALLY DAILY 01/23/20  Yes Carles Collet M, PA-C  guaifenesin (HUMIBID E) 400 MG TABS tablet Take 400 mg by mouth  in the morning and at bedtime.   Yes [provider]  ipratropium-albuterol (DUONEB) 0.5-2.5 (3) MG/3ML SOLN Take 3 mLs by nebulization 3 (three) times daily. 05/15/20  Yes [provider]  levocetirizine (XYZAL) 5 MG tablet Take 5 mg by mouth every evening.   Yes [provider]  Mouthwashes  (MOUTHWASH/GARGLE MT) Use as directed 5 mLs in the mouth or throat in the morning, at noon, in the evening, and at bedtime. 1 tsp by mouth as needed for sore mouth and tongue   Yes [provider]  pantoprazole (PROTONIX) 20 MG tablet Take 1 tablet (20 mg total) by mouth 2 (two) times daily. 05/01/20 10/28/20 Yes Bacigalupo, Dionne Bucy, MD  SPIRIVA HANDIHALER 18 MCG inhalation capsule PLACE 1 CAPSULE INTO INHALER AND INHALE DAILY Patient taking differently: Place 18 mcg into inhaler and inhale at bedtime. 01/23/20  Yes Pollak, Adriana M, PA-C  SYNTHROID 50 MCG tablet TAKE 1 TABLET DAILY Patient taking differently: Take 50 mcg by mouth daily. 01/24/20  Yes Carles Collet M, PA-C  venlafaxine XR (EFFEXOR-XR) 75 MG 24 hr capsule TAKE 1 CAPSULE DAILY WITH BREAKFAST Patient taking differently: Take 75 mg by mouth daily with breakfast. 09/15/19  Yes Eappen, Ria Clock, MD  acetaminophen (TYLENOL) 650 MG CR tablet Take 650 mg by mouth every 8 (eight) hours as needed for pain.    [provider]  AMBULATORY NON FORMULARY MEDICATION Medication Name: incentive spirometry Use as directed 03/03/15   Vilinda Boehringer, MD  doxycycline (VIBRA-TABS) 100 MG tablet Take 1 tablet (100 mg total) by mouth 2 (two) times daily. Patient not taking: No sig reported 05/12/20   Chrismon, Vickki Muff, PA-C  furosemide (LASIX) 40 MG tablet TAKE 1 TABLET DAILY Patient not taking: No sig reported 01/24/20   Trinna Post, PA-C  hydrocortisone 1 % lotion Apply 1 application topically as needed for itching.    [provider]  loperamide (IMODIUM) 2 MG capsule Take 2 mg by mouth as needed for diarrhea or loose stools.    [provider]  magnesium hydroxide (MILK OF MAGNESIA) 400 MG/5ML suspension Take by mouth daily as needed for mild constipation. 2 tbsp    [provider]  nystatin (MYCOSTATIN/NYSTOP) powder Apply 1 application topically 2 (two) times daily as needed.    [provider]   OXYGEN Inhale 2 L into the lungs continuous as needed.    [provider]  simvastatin (ZOCOR) 20 MG tablet TAKE 1 TABLET DAILY Patient not taking: No sig reported 01/24/20   Trinna Post, PA-C  spironolactone (ALDACTONE) 25 MG tablet Take 1 tablet (25 mg total) by mouth daily. Patient not taking: No sig reported 03/02/19   Trinna Post, PA-C  tamsulosin (FLOMAX) 0.4 MG CAPS capsule TAKE 1 CAPSULE DAILY Patient not taking: No sig reported 01/24/20   Trinna Post, PA-C  triamcinolone (KENALOG) 0.1 %  02/28/19   [provider]    Allergies Sulfa antibiotics, Erythromycin, Aspirin, Contrast media [iodinated diagnostic agents], Latex, Penicillin g, Penicillins, and Tape  Family History  Problem Relation Age of Onset  . Stroke Mother   . Hypertension Mother   . Heart disease Father   . Hypertension Father     Social History Social History   Tobacco Use  . Smoking status: Never Smoker  . Smokeless tobacco: Never Used  . Tobacco comment: quit 1954  Vaping Use  . Vaping Use: Never used  Substance Use Topics  . Alcohol use: No  .  Drug use: No    Review of Systems Constitutional: No fever/chills Eyes: No visual changes. ENT: No sore throat. Cardiovascular: Endorses chest pain. Respiratory: Endorses shortness of breath. Gastrointestinal: No abdominal pain.  No nausea, no vomiting.  No diarrhea. Genitourinary: Negative for dysuria. Musculoskeletal: Negative for acute arthralgias Skin: Negative for rash. Neurological: Negative for headaches, weakness/numbness/paresthesias in any extremity Psychiatric: Negative for suicidal ideation/homicidal ideation   ____________________________________________   PHYSICAL EXAM:  VITAL SIGNS: ED Triage Vitals  Enc Vitals Group     BP 05/30/20 1508 (!) 158/68     Pulse Rate 05/30/20 1508 (!) 126     Resp 05/30/20 1508 (!) 28     Temp 05/30/20 1508 98.5 F (36.9 C)     Temp Source 05/30/20 1508 Oral      SpO2 05/30/20 1508 100 %     Weight 05/30/20 1509 101 lb 6.6 oz (46 kg)     Height 05/30/20 1509 5' (1.524 m)     Head Circumference --      Peak Flow --      Pain Score 05/30/20 1509 0     Pain Loc --      Pain Edu? --      Excl. in [place]? --    Constitutional: Alert and oriented. Well appearing and in no acute distress. Eyes: Conjunctivae are normal. PERRL. Head: Atraumatic. Nose: No congestion/rhinnorhea. Mouth/Throat: Mucous membranes are moist. Neck: No stridor Cardiovascular: Grossly normal heart sounds.  Good peripheral circulation. Respiratory: Rhonchi over left lung fields with inspiratory and expiratory wheezes over right lung fields.  Increased normal respiratory effort.  No retractions. Gastrointestinal: Soft and nontender. No distention. Musculoskeletal: No obvious deformities Neurologic:  Normal speech and language. No gross focal neurologic deficits are appreciated. Skin:  Skin is warm and dry. No rash noted. Psychiatric: Mood and affect are normal. Speech and behavior are normal.  ____________________________________________   LABS (all labs ordered are listed, but only abnormal results are displayed)  Labs Reviewed  BRAIN NATRIURETIC PEPTIDE - Abnormal; Notable for the following components:      Result Value   B Natriuretic Peptide 501.7 (*)    All other components within normal limits  COMPREHENSIVE METABOLIC PANEL - Abnormal; Notable for the following components:   Sodium 134 (*)    Chloride 97 (*)    Glucose, Bld 140 (*)    BUN 7 (*)    Calcium 8.1 (*)    Albumin 2.8 (*)    AST 43 (*)    ALT 57 (*)    All other components within normal limits  CBC WITH DIFFERENTIAL/PLATELET - Abnormal; Notable for the following components:   WBC 14.9 (*)    RBC 2.80 (*)    Hemoglobin 8.7 (*)    HCT 26.9 (*)    RDW 15.9 (*)    Platelets 424 (*)    Neutro Abs 13.6 (*)    Lymphs Abs 0.5 (*)    Abs Immature Granulocytes 0.09 (*)    All other components within normal  limits  TROPONIN I (HIGH SENSITIVITY) - Abnormal; Notable for the following components:   Troponin I (High Sensitivity) 50 (*)    All other components within normal limits  CULTURE, BLOOD (SINGLE)  RESP PANEL BY RT-PCR (FLU A&B, COVID) ARPGX2  LACTIC ACID, PLASMA  LACTIC ACID, PLASMA   ____________________________________________  EKG  ED ECG REPORT I, Naaman Plummer, the attending physician, personally viewed and interpreted this ECG.  Date: 05/30/2020 EKG Time: 1512  Rate: 126 Rhythm: Tachycardic sinus rhythm QRS Axis: normal Intervals: normal ST/T Wave abnormalities: normal Narrative Interpretation: no evidence of acute ischemia  ____________________________________________  RADIOLOGY  ED MD interpretation: Single view portable x-ray of the chest shows bibasilar atelectasis as well as a right upper lobe infiltrate  Official radiology report(s): DG Chest Port 1 View  Result Date: 05/30/2020 CLINICAL DATA:  Shortness of breath EXAM: PORTABLE CHEST 1 VIEW COMPARISON:  Portable exam 1514 hours compared to 05/12/2020 FINDINGS: Upper normal heart size. Atherosclerotic calcification aorta. Moderate-sized hiatal hernia. Infiltrates identified in RIGHT upper lobe question pneumonia. Bibasilar atelectasis and probable tiny pleural effusions. No pneumothorax. Bones demineralized with advanced RIGHT glenohumeral degenerative changes, thoracolumbar scoliosis, and prior lumbar spinal augmentation procedures. IMPRESSION: Bibasilar atelectasis and probable tiny pleural effusions. RIGHT upper lobe infiltrate question pneumonia. Moderate-sized hiatal hernia. Electronically Signed   By: Lavonia Dana M.D.   On: 05/30/2020 15:48    ____________________________________________   PROCEDURES  Procedure(s) performed (including Critical Care):  .1-3 Lead EKG Interpretation Performed by: Naaman Plummer, MD Authorized by: Naaman Plummer, MD     Interpretation: abnormal     ECG rate:  123    ECG rate assessment: tachycardic     Rhythm: sinus tachycardia     Ectopy: none     Conduction: normal   .Critical Care Performed by: Naaman Plummer, MD Authorized by: Naaman Plummer, MD   Critical care provider statement:    Critical care time (minutes):  31   Critical care time was exclusive of:  Separately billable procedures and treating other patients   Critical care was necessary to treat or prevent imminent or life-threatening deterioration of the following conditions:  Respiratory failure   Critical care was time spent personally by me on the following activities:  Discussions with consultants, evaluation of patient's response to treatment, examination of patient, ordering and performing treatments and interventions, ordering and review of laboratory studies, ordering and review of radiographic studies, pulse oximetry, re-evaluation of patient's condition, obtaining history from patient or surrogate and review of old charts   I assumed direction of critical care for this patient from another provider in my specialty: no     Care discussed with: admitting provider       ____________________________________________   INITIAL IMPRESSION / ASSESSMENT AND PLAN / ED COURSE  As part of my medical decision making, I reviewed the following data within the [place] notes reviewed and incorporated, Labs reviewed, EKG interpreted, Old chart reviewed, Radiograph reviewed and Notes from prior ED visits reviewed and incorporated        Presents with shortness of breath, cough, and malaise concerning for pneumonia.  DDx: PE, COPD exacerbation, Pneumothorax, TB, Atypical ACS, Esophageal Rupture, Toxic Exposure, Foreign Body Airway Obstruction.  Workup: CXR CBC, CMP, lactate, troponin  Given History, Exam, and Workup presentation most consistent with pneumonia.  Findings: Right upper lobe pneumonia  Tx: Cefdinir and vancomycin  1622 Reassessment: As  patient is continuing to require supplemental oxygenation for acute hypoxic respiratory failure, patient will require admission to the internal medicine service for further evaluation and management  Disposition: Admit      ____________________________________________   FINAL CLINICAL IMPRESSION(S) / ED DIAGNOSES  Final diagnoses:  HCAP (healthcare-associated pneumonia)  Shortness of breath  Acute on chronic respiratory failure with hypoxia [facility])     ED Discharge Orders    None       Note:  This document was prepared using Dragon  voice recognition software and may include unintentional dictation errors.   Naaman Plummer, MD 05/30/20 [phone]

## 2020-05-30 NOTE — ED Notes (Signed)
Pt maintain on nasal cannula at 4L with respirations between 25-30. MD updated and is switching from stepdown to PCU. Bipap will go up with pt in the case the need arises as she goes to sleep.

## 2020-05-30 NOTE — ED Notes (Signed)
RT at bedside to trial patient on BIPAP to aid with increased WOB,.

## 2020-05-30 NOTE — ED Notes (Signed)
Decision with daughter and Dr. Tyrell Antonio at bedside to try patient on BIPAP. RT called.

## 2020-05-30 NOTE — Consult Note (Signed)
CODE SEPSIS - PHARMACY COMMUNICATION  **Broad Spectrum Antibiotics should be administered within 1 hour of Sepsis diagnosis**  Time Code Sepsis Called/Page Received: 1613  Antibiotics Ordered: 4270  Time of 1st antibiotic administration: 1621  Additional action taken by pharmacy: n/a  If necessary, Name of Provider/Nurse Contacted: [place] ,PharmD Clinical Pharmacist  05/30/2020  4:33 PM

## 2020-05-30 NOTE — ED Notes (Signed)
Pt provided with meal tray and PO fluid at this time, per request. BiPap on standby and pt placed onto 4L for trial while eating.   Family at bedside assisting.

## 2020-05-30 NOTE — Telephone Encounter (Signed)
I do not have this form. I have completed all paperwork that has been received.

## 2020-05-30 NOTE — Sepsis Progress Note (Signed)
Sepsis protocol being followed by [facility] 

## 2020-05-30 NOTE — ED Notes (Signed)
Ally RN aware of assigned bed

## 2020-05-30 NOTE — H&P (Signed)
History and Physical    SHERMAINE RIVET KKX:[id number] DOB: 05-30-1926 DOA: 05/30/2020  PCP: Virginia Crews, MD  Patient coming from: Rehab.   I have personally briefly reviewed patient's old medical records in [place]  Chief Complaint: Worsening cough, SOB  HPI: Sandra Brown is a 87 y.o. female with medical history significant  COPD, depression, hypothyroidism, hypertension, recently treated with doxycycline/ prednisone  for COPD exacerbation , family chest x-ray performed at the facility did not show pneumonia, over the last 3 days patient has been getting progressively worse shortness of breath, cough and shortness of breath.  She completed the doxycycline 3 days prior to admission.  Since she completed course of Doxycycline (7 days) she has been getting worse.  She report poor oral intake.  Patient reports worsening cough with purulent sputum.  Patient takes Lasix for lower extremity edema.  No prior cardiac history.  Since this recent flare of COPD/pneumonia she has been on 2 L of oxygen at the facility.  She denies coughing spells after she eats. No history of chest pain.   ED Course: Sodium 134, potassium 4.1, BUN 7, creatinine 0.8, AST 43, ALT 57, BNP 501, troponin 50, white blood cell 14, lactic acid 0.5, hemoglobin 8.7.  COVID-19 pending, chest x-ray showed bibasilar atelectasis and right upper lobe pneumonia  Review of Systems: As per HPI otherwise all other systems reviewed and are negative.   Past Medical History:  Diagnosis Date  . Cataract   . COPD (chronic obstructive pulmonary disease) ([place])   . Depression   . Difficulty swallowing   . Frequent headaches   . Hearing loss   . Hypertension   . Hypothyroidism   . Reflux     Past Surgical History:  Procedure Laterality Date  . ABDOMINAL HYSTERECTOMY    . APPENDECTOMY    . LUMBAR LAMINECTOMY    . PARATHYROIDECTOMY    . TOTAL HIP ARTHROPLASTY     x 4    Social History  reports that she has  never smoked. She has never used smokeless tobacco. She reports that she does not drink alcohol and does not use drugs.  Allergies  Allergen Reactions  . Sulfa Antibiotics Rash and Itching    Other reaction(s): Diarrhea and vomiting (finding)  . Erythromycin Nausea And Vomiting    Other reaction(s): Diarrhea and vomiting (finding)  . Aspirin Other (See Comments), Tinitus and Nausea And Vomiting    Ringing of the ears, caused hearing loss both ears Ringing in ears  . Contrast Media [Iodinated Diagnostic Agents] Rash and Hives  . Latex Rash and Itching  . Penicillin G Rash  . Penicillins Rash    Other reaction(s): UNKNOWN  . Tape Rash    Other reaction(s): UNKNOWN Adhesive Other reaction(s): UNKNOWN Adhesive    Family History  Problem Relation Age of Onset  . Stroke Mother   . Hypertension Mother   . Heart disease Father   . Hypertension Father    Prior to Admission medications   Medication Sig Start Date End Date Taking? Authorizing Provider  Dextromethorphan-guaiFENesin (DELSYM COUGH/CHEST CONGEST DM) 5-100 MG/5ML LIQD Take 5 mLs by mouth every 12 (twelve) hours as needed (cough).   Yes [provider]  fluticasone (FLONASE) 50 MCG/ACT nasal spray USE 2 SPRAYS NASALLY DAILY Patient taking differently: Place 2 sprays into both nostrils daily. USE 2 SPRAYS NASALLY DAILY 01/23/20  Yes Carles Collet M, PA-C  guaifenesin (HUMIBID E) 400 MG TABS tablet Take 400 mg  by mouth in the morning and at bedtime.   Yes [provider]  ipratropium-albuterol (DUONEB) 0.5-2.5 (3) MG/3ML SOLN Take 3 mLs by nebulization 3 (three) times daily. 05/15/20  Yes [provider]  levocetirizine (XYZAL) 5 MG tablet Take 5 mg by mouth every evening.   Yes [provider]  Mouthwashes (MOUTHWASH/GARGLE MT) Use as directed 5 mLs in the mouth or throat in the morning, at noon, in the evening, and at bedtime. 1 tsp by mouth as needed for sore mouth and tongue   Yes [provider]  pantoprazole (PROTONIX) 20 MG tablet Take 1 tablet (20 mg total) by mouth 2 (two) times daily. 05/01/20 10/28/20 Yes Bacigalupo, Dionne Bucy, MD  SPIRIVA HANDIHALER 18 MCG inhalation capsule PLACE 1 CAPSULE INTO INHALER AND INHALE DAILY Patient taking differently: Place 18 mcg into inhaler and inhale at bedtime. 01/23/20  Yes Pollak, Adriana M, PA-C  SYNTHROID 50 MCG tablet TAKE 1 TABLET DAILY Patient taking differently: Take 50 mcg by mouth daily. 01/24/20  Yes Carles Collet M, PA-C  venlafaxine XR (EFFEXOR-XR) 75 MG 24 hr capsule TAKE 1 CAPSULE DAILY WITH BREAKFAST Patient taking differently: Take 75 mg by mouth daily with breakfast. 09/15/19  Yes Eappen, Ria Clock, MD  acetaminophen (TYLENOL) 650 MG CR tablet Take 650 mg by mouth every 8 (eight) hours as needed for pain.    [provider]  AMBULATORY NON FORMULARY MEDICATION Medication Name: incentive spirometry Use as directed 03/03/15   Vilinda Boehringer, MD  doxycycline (VIBRA-TABS) 100 MG tablet Take 1 tablet (100 mg total) by mouth 2 (two) times daily. Patient not taking: No sig reported 05/12/20   Chrismon, Vickki Muff, PA-C  furosemide (LASIX) 40 MG tablet TAKE 1 TABLET DAILY Patient not taking: No sig reported 01/24/20   Trinna Post, PA-C  hydrocortisone 1 % lotion Apply 1 application topically as needed for itching.    [provider]  loperamide (IMODIUM) 2 MG capsule Take 2 mg by mouth as needed for diarrhea or loose stools.    [provider]  magnesium hydroxide (MILK OF MAGNESIA) 400 MG/5ML suspension Take by mouth daily as needed for mild constipation. 2 tbsp    [provider]  nystatin (MYCOSTATIN/NYSTOP) powder Apply 1 application topically 2 (two) times daily as needed.    [provider]  OXYGEN Inhale 2 L into the lungs continuous as needed.    [provider]  simvastatin (ZOCOR) 20 MG tablet TAKE 1 TABLET DAILY Patient not taking: No sig reported 01/24/20    Trinna Post, PA-C  spironolactone (ALDACTONE) 25 MG tablet Take 1 tablet (25 mg total) by mouth daily. Patient not taking: No sig reported 03/02/19   Trinna Post, PA-C  tamsulosin (FLOMAX) 0.4 MG CAPS capsule TAKE 1 CAPSULE DAILY Patient not taking: No sig reported 01/24/20   Trinna Post, PA-C  triamcinolone (KENALOG) 0.1 %  02/28/19   [provider]    Physical Exam: Vitals:   05/30/20 1509 05/30/20 1615 05/30/20 1630 05/30/20 1700  BP:   (!) 145/58 (!) 149/79  Pulse:  78 (!) 122 (!) 102  Resp:   (!) 24   Temp:      TempSrc:      SpO2:  100% 98% 92%  Weight: 46 kg     Height: 5' (1.524 m)       Constitutional:  distress, tachypnea, Audible Wheezing.  Vitals:   05/30/20 1509 05/30/20 1615 05/30/20 1630 05/30/20 1700  BP:   (!) 145/58 (!) 149/79  Pulse:  78 (!) 122 (!) 102  Resp:   (!) 24   Temp:      TempSrc:      SpO2:  100% 98% 92%  Weight: 46 kg     Height: 5' (1.524 m)      Eyes: PERRL, lids and conjunctivae normal ENMT: Mucous membranes are moist. Posterior pharynx clear of any exudate or lesions.Normal dentition.  Neck: normal, supple, no masses, no thyromegaly Respiratory: Tachypnea, BL wheezing.  Cardiovascular: Regular rate and rhythm, no murmurs / rubs / gallops. Mild  extremity edema. 2+ pedal pulses. No carotid bruits. Positive JVD Abdomen: no tenderness, no masses palpated. No hepatosplenomegaly. Bowel sounds positive.  Musculoskeletal: no clubbing / cyanosis.  Skin: no rashes, lesions, ulcers. No induration Neurologic: CN 2-12 grossly intact. Moves all 4 extremities.  Psychiatric: Normal judgment and insight. Alert and oriented x 3. Normal mood.    Labs on Admission: I have personally reviewed following labs and imaging studies  CBC: Recent Labs  Lab 05/30/20 1512  WBC 14.9*  NEUTROABS 13.6*  HGB 8.7*  HCT 26.9*  MCV 96.1  PLT 424*    Basic Metabolic Panel: Recent Labs  Lab 05/30/20 1512  NA 134*  K 4.1  CL 97*   CO2 24  GLUCOSE 140*  BUN 7*  CREATININE 0.83  CALCIUM 8.1*    GFR: Estimated Creatinine Clearance: 31.7 mL/min (by C-G formula based on SCr of 0.83 mg/dL).  Liver Function Tests: Recent Labs  Lab 05/30/20 1512  AST 43*  ALT 57*  ALKPHOS 81  BILITOT 0.8  PROT 6.6  ALBUMIN 2.8*    Urine analysis: No results found for: COLORURINE, APPEARANCEUR, LABSPEC, PHURINE, GLUCOSEU, HGBUR, BILIRUBINUR, KETONESUR, PROTEINUR, UROBILINOGEN, NITRITE, LEUKOCYTESUR  Radiological Exams on Admission: DG Chest Port 1 View  Result Date: 05/30/2020 CLINICAL DATA:  Shortness of breath EXAM: PORTABLE CHEST 1 VIEW COMPARISON:  Portable exam 1514 hours compared to 05/12/2020 FINDINGS: Upper normal heart size. Atherosclerotic calcification aorta. Moderate-sized hiatal hernia. Infiltrates identified in RIGHT upper lobe question pneumonia. Bibasilar atelectasis and probable tiny pleural effusions. No pneumothorax. Bones demineralized with advanced RIGHT glenohumeral degenerative changes, thoracolumbar scoliosis, and prior lumbar spinal augmentation procedures. IMPRESSION: Bibasilar atelectasis and probable tiny pleural effusions. RIGHT upper lobe infiltrate question pneumonia. Moderate-sized hiatal hernia. Electronically Signed   By: Lavonia Dana M.D.   On: 05/30/2020 15:48    EKG: Independently reviewed. Sinus tachycardia.   Assessment/Plan Active Problems:   COPD (chronic obstructive pulmonary disease) (HCC)   GERD (gastroesophageal reflux disease)   Depression   Hyperlipemia   CKD (chronic kidney disease) stage 3, GFR 30-59 ml/min (HCC)   Hypothyroidism   Pneumonia   Acute on chronic respiratory failure with hypoxia (HCC)  1-Acute on chronic Hypoxic Respiratory Failure.  Secondary to PNA and COPD exacerbation.  -Patient started on Oxygen 10 days ago for COPD/PNA. She was treated with prednisone and Doxycycline. -She presents with worsening cough, dyspnea, BL wheezing for last 3 days after she  completed antibiotics.  -Chest x ray showed Right upper lobe infiltrates, Bibasilar atelectasis. Leukocytsosi.  -Agree with Vancomycin and cefepime. IV -Check Sputum Culture, MRSA PCR>  -Follow Covid PCR test, pending on admission.  -Chest PT. Duoneb, Pulmicort, brovana.  -BNP elevated, will give one time dose lasix.  -Speech swallow evaluation.  - Addendum;  Came back to check on patient, he received meds, nebulizer, develops increase work of breathing. Discussed with daughter will try BIPAP to  give time to medications to work. If patient is not able to tolerates BIPAP or get worse, will use Morphine as needed for increase work of breathing or dyspnea.   2-Mild elevation troponin, Elevation BNP/.  -elevation troponin could related to demand ischemia from PNA>  Cycle enzymes.  Check ECHO.  One time dose lasix, monitor volume status.   3-Hypothyroidism; Continue with synthroid.  4-GERD; Protonix.  5-Tachycardia;  Will give one time dose Cardizem IV.        DVT prophylaxis: Lovenox Code Status:  DNR--trial of BIPAP  Family Communication:  Daughter at bedside.  Disposition Plan:   Patient is from:  SNF   Anticipated DC to:  SNF  Anticipated DC date:  2-3 days depending respiratory status.   Anticipated DC barriers: None Consults called:  None Admission status:  Inpatient, admitted with respiratory distress, pneumonia failed oral doxycycline.  Admitted for IV antibiotic       Elmarie Shiley MD [facility]  How to contact the [facility] Attending or Consulting provider [place] or covering provider during after hours [place], for this patient?   1. Check the care team in [facility] and look for a) attending/consulting TR[facility] provider listed and b) the [facility] team listed 2. Log into [email or web address] and use [place]'s universal password to access. If you do not have the password, please contact the hospital operator. 3. Locate the [facility] provider you are looking for under [facility] and page to a number that you can be directly reached. 4. If you still have difficulty reaching the provider, please page the [facility] (Director on Call) for the Hospitalists listed on amion for assistance.  05/30/2020, 5:25 PM

## 2020-05-30 NOTE — Consult Note (Signed)
PHARMACY -  BRIEF ANTIBIOTIC NOTE   Pharmacy has received consult(s) for vancomycin and cefepime from an ED provider.  The patient's profile has been reviewed for ht/wt/allergies/indication/available labs.    One time order(s) placed for cefepime 2 g and vancomycin 1 g   Further antibiotics/pharmacy consults should be ordered by admitting physician if indicated.                       Thank you, Darnelle Bos, PharmD 05/30/2020  4:32 PM

## 2020-05-30 NOTE — Consult Note (Signed)
Pharmacy Antibiotic Note  Sandra Brown is a 87 y.o. female admitted on 05/30/2020 with pneumonia.  Pharmacy has been consulted for vancomycin & cefepime dosing.  Plan: - Cefepime 2g x1 in ED; then Cefepime 2g q12h - Vancomycin 1g x1 in ED; then Vancomycin 500mg  q24h  Goal AUC 400-600. Expected AUC: 491.4 SCr used: 0.83 (Vd: 0.72 ; IBW 45.5kg) Cmin 14.1 F/u MRSA PCR  Height: 5' (152.4 cm) Weight: 46 kg (101 lb 6.6 oz) IBW/kg (Calculated) : 45.5  Temp (24hrs), Avg:98.5 F (36.9 C), Min:98.5 F (36.9 C), Max:98.5 F (36.9 C)  Recent Labs  Lab 05/30/20 1512  WBC 14.9*  CREATININE 0.83  LATICACIDVEN 0.5    Estimated Creatinine Clearance: 31.7 mL/min (by C-G formula based on SCr of 0.83 mg/dL).    Allergies  Allergen Reactions  . Sulfa Antibiotics Rash and Itching    Other reaction(s): Diarrhea and vomiting (finding)  . Erythromycin Nausea And Vomiting    Other reaction(s): Diarrhea and vomiting (finding)  . Aspirin Other (See Comments), Tinitus and Nausea And Vomiting    Ringing of the ears, caused hearing loss both ears Ringing in ears  . Contrast Media [Iodinated Diagnostic Agents] Rash and Hives  . Latex Rash and Itching  . Penicillin G Rash  . Penicillins Rash    Other reaction(s): UNKNOWN  . Tape Rash    Other reaction(s): UNKNOWN Adhesive Other reaction(s): UNKNOWN Adhesive    Antimicrobials this admission: CFP (5/24 >>  VAN (5/24 >>   Dose adjustments this admission: CTM renal; borderline for adjustments.  Microbiology results: 5/24 BCx: (single) sent/pending 5/24 Sputum: sent/pending  5/24 MRSA PCR: sent/pending  Thank you for allowing pharmacy to be a part of this patient's care.  Lorna Dibble 05/30/2020 5:28 PM

## 2020-05-30 NOTE — Telephone Encounter (Signed)
Caller name: Elie Goody  Relation to pt: from [place] reference # [id number] Call back number: [phone]   Reason for call:  Clinical Review form faxed over on 05/23/20 to  [phone] please note when received. Will call back on Friday 06/02/2020

## 2020-05-30 NOTE — ED Triage Notes (Signed)
BIB [facility] from [facility] for respiratory distress. Pt has been at facility for last 10 days and has progressively been getting worse. 2L [place] at baseline, mid 80's on EMS arrival on [place]. Arrives using 3 duonebs with mask. Pt in NAD at this time. RR increased with wheezing noted, ST on EKG. 98% on 2L when switched the [place] with ED staff. Alert to self, situation and place. Disoriented to time.

## 2020-05-30 NOTE — ED Notes (Addendum)
Pt acute decompensation including increased wheezing, RR and effort. Admitting at bedside to reassess. NRB placed on patient. 100% on 2L but pt feels that she is not getting enough air. Pt 100% on NRB. Wheezing improves after approx 6 minutes. Pt states that she has had "these spells" at facility. Repeat EKG obtained.

## 2020-05-30 NOTE — Telephone Encounter (Signed)
Forms have been emailed to [employer], Therapist, sports.

## 2020-05-30 NOTE — ED Notes (Signed)
pericare provided, purewick placed on patient. Daughter at bedside. Pt WOB much easier at this time. Pt states BIPAP making her feel much better.

## 2020-05-31 DIAGNOSIS — J9621 Acute and chronic respiratory failure with hypoxia: Secondary | ICD-10-CM

## 2020-05-31 DIAGNOSIS — E039 Hypothyroidism, unspecified: Secondary | ICD-10-CM

## 2020-05-31 DIAGNOSIS — J449 Chronic obstructive pulmonary disease, unspecified: Secondary | ICD-10-CM

## 2020-05-31 LAB — BASIC METABOLIC PANEL
Anion gap: 9 (ref 5–15)
BUN: 12 mg/dL (ref 8–23)
CO2: 24 mmol/L (ref 22–32)
Calcium: 7.9 mg/dL — ABNORMAL LOW (ref 8.9–10.3)
Chloride: 104 mmol/L (ref 98–111)
Creatinine, Ser: 0.85 mg/dL (ref 0.44–1.00)
GFR, Estimated: >60 mL/min (ref >60)
Glucose, Bld: 190 mg/dL — ABNORMAL HIGH (ref 70–99)
Potassium: 4 mmol/L (ref 3.5–5.1)
Sodium: 137 mmol/L (ref 135–145)

## 2020-05-31 LAB — ECHOCARDIOGRAM COMPLETE
Area-P 1/2: 3.91 cm2
Height: 60 in
S' Lateral: 2.16 cm
Weight: 1622.59 oz

## 2020-05-31 LAB — CBC
HCT: 23.4 % — ABNORMAL LOW (ref 36.0–46.0)
Hemoglobin: 7.2 g/dL — ABNORMAL LOW (ref 12.0–15.0)
MCH: 29.8 pg (ref 26.0–34.0)
MCHC: 30.8 g/dL (ref 30.0–36.0)
MCV: 96.7 fL (ref 80.0–100.0)
Platelets: 360 10*3/uL (ref 150–400)
RBC: 2.42 MIL/uL — ABNORMAL LOW (ref 3.87–5.11)
RDW: 15.7 % — ABNORMAL HIGH (ref 11.5–15.5)
WBC: 7.6 10*3/uL (ref 4.0–10.5)
nRBC: 0 % (ref 0.0–0.2)

## 2020-05-31 LAB — PROCALCITONIN: Procalcitonin: 0.22 ng/mL

## 2020-05-31 LAB — MRSA PCR SCREENING: MRSA by PCR: NEGATIVE

## 2020-05-31 MED ORDER — GLUCERNA SHAKE PO LIQD
237.0000 mL | Freq: Three times a day (TID) | ORAL | Status: DC
  Administered 2020-06-01 – 2020-06-03 (×6): 237 mL via ORAL

## 2020-05-31 MED ORDER — VENLAFAXINE HCL ER 75 MG PO CP24
75.0000 mg | ORAL_CAPSULE | Freq: Every day | ORAL | Status: DC
  Administered 2020-05-31 – 2020-06-03 (×4): 75 mg via ORAL
  Filled 2020-05-31 (×4): qty 1

## 2020-05-31 MED ORDER — LEVOTHYROXINE SODIUM 50 MCG PO TABS
50.0000 ug | ORAL_TABLET | Freq: Every day | ORAL | Status: DC
  Administered 2020-05-31 – 2020-06-03 (×4): 50 ug via ORAL
  Filled 2020-05-31 (×4): qty 1

## 2020-05-31 MED ORDER — CETIRIZINE HCL 10 MG PO TABS
10.0000 mg | ORAL_TABLET | Freq: Every evening | ORAL | Status: DC
  Administered 2020-05-31 – 2020-06-02 (×3): 10 mg via ORAL
  Filled 2020-05-31 (×4): qty 1

## 2020-05-31 MED ORDER — SODIUM CHLORIDE 0.9 % IV SOLN
250.0000 mL | INTRAVENOUS | Status: DC | PRN
  Administered 2020-05-31: 250 mL via INTRAVENOUS

## 2020-05-31 MED ORDER — ENOXAPARIN SODIUM 40 MG/0.4ML IJ SOSY
40.0000 mg | PREFILLED_SYRINGE | INTRAMUSCULAR | Status: DC
  Administered 2020-06-01: 40 mg via SUBCUTANEOUS
  Filled 2020-05-31: qty 0.4

## 2020-05-31 MED ORDER — ENOXAPARIN SODIUM 30 MG/0.3ML IJ SOSY: 30.0000 mg | PREFILLED_SYRINGE | INTRAMUSCULAR | Status: DC

## 2020-05-31 MED ORDER — ACETAMINOPHEN 325 MG PO TABS
650.0000 mg | ORAL_TABLET | Freq: Four times a day (QID) | ORAL | Status: DC | PRN
  Administered 2020-06-03: 650 mg via ORAL
  Filled 2020-05-31: qty 2

## 2020-05-31 MED ORDER — SENNOSIDES-DOCUSATE SODIUM 8.6-50 MG PO TABS: 1.0000 | ORAL_TABLET | Freq: Every evening | ORAL | Status: DC | PRN

## 2020-05-31 MED ORDER — IPRATROPIUM-ALBUTEROL 0.5-2.5 (3) MG/3ML IN SOLN
3.0000 mL | Freq: Two times a day (BID) | RESPIRATORY_TRACT | Status: DC
  Administered 2020-06-01 – 2020-06-03 (×5): 3 mL via RESPIRATORY_TRACT
  Filled 2020-05-31 (×5): qty 3

## 2020-05-31 MED ORDER — PANTOPRAZOLE SODIUM 40 MG PO PACK
20.0000 mg | PACK | Freq: Two times a day (BID) | ORAL | Status: DC
  Administered 2020-05-31 – 2020-06-03 (×7): 20 mg via ORAL
  Filled 2020-05-31 (×9): qty 20

## 2020-05-31 MED ORDER — TRIAMCINOLONE ACETONIDE 0.1 % EX CREA
TOPICAL_CREAM | Freq: Two times a day (BID) | CUTANEOUS | Status: DC
  Filled 2020-05-31: qty 15

## 2020-05-31 MED ORDER — SODIUM CHLORIDE 0.9% FLUSH
3.0000 mL | Freq: Two times a day (BID) | INTRAVENOUS | Status: DC
  Administered 2020-05-31 – 2020-06-03 (×8): 3 mL via INTRAVENOUS

## 2020-05-31 MED ORDER — ONDANSETRON HCL 4 MG PO TABS: 4.0000 mg | ORAL_TABLET | Freq: Four times a day (QID) | ORAL | Status: DC | PRN

## 2020-05-31 MED ORDER — ONDANSETRON HCL 4 MG/2ML IJ SOLN: 4.0000 mg | Freq: Four times a day (QID) | INTRAMUSCULAR | Status: DC | PRN

## 2020-05-31 MED ORDER — FLUTICASONE PROPIONATE 50 MCG/ACT NA SUSP
2.0000 | Freq: Every day | NASAL | Status: DC
  Administered 2020-05-31 – 2020-06-03 (×4): 2 via NASAL
  Filled 2020-05-31: qty 16

## 2020-05-31 MED ORDER — ACETAMINOPHEN 650 MG RE SUPP: 650.0000 mg | Freq: Four times a day (QID) | RECTAL | Status: DC | PRN

## 2020-05-31 MED ORDER — SODIUM CHLORIDE 0.9% FLUSH: 3.0000 mL | INTRAVENOUS | Status: DC | PRN

## 2020-05-31 MED ORDER — ADULT MULTIVITAMIN W/MINERALS CH
1.0000 | ORAL_TABLET | Freq: Every day | ORAL | Status: DC
  Administered 2020-06-01 – 2020-06-03 (×3): 1 via ORAL
  Filled 2020-05-31 (×3): qty 1

## 2020-05-31 NOTE — Evaluation (Addendum)
Clinical/Bedside Swallow Evaluation Patient Details  Name: Sandra Brown MRN: [id number] Date of Birth: [birth year]  Today's Date: 05/31/2020 Time: SLP Start Time (ACUTE ONLY): 1145 SLP Stop Time (ACUTE ONLY): 1255 SLP Time Calculation (min) (ACUTE ONLY): 70 min  Past Medical History:  Past Medical History:  Diagnosis Date  . Cataract   . COPD (chronic obstructive pulmonary disease) ([place])   . Depression   . Difficulty swallowing   . Frequent headaches   . Hearing loss   . Hypertension   . Hypothyroidism   . Reflux    Past Surgical History:  Past Surgical History:  Procedure Laterality Date  . ABDOMINAL HYSTERECTOMY    . APPENDECTOMY    . LUMBAR LAMINECTOMY    . PARATHYROIDECTOMY    . TOTAL HIP ARTHROPLASTY     x 4   HPI:  Pt 87 y.o. female with medical history of Mod. size Hiatal Hernia, COPD, depression, hypothyroidism, hypertension, HOH, recently treated with doxycycline/ prednisone for COPD exacerbation, presented with several-day history of progressively worsening shortness of breath, weakness w/ decreased O2 sats (80%).  Admitted for management of COPD as well as pneumonia.  CXR: "Bibasilar atelectasis and probable tiny pleural effusions.  RIGHT upper lobe infiltrate question pneumonia.  Moderate-sized hiatal hernia.".  Pt has Baseline Esophageal Dysmotility per pt/family report.  Assessment / Plan / Recommendation Clinical Impression  Pt appears to present w/ grossly adequate oropharyngeal phase swallowing function w/ No overt oropharyngeal phase dysphagia appreciated w/ trials accepted; No neuromuscular swallowing deficits appreciated. Pt is at reduced risk for aspiration from an oropharyngeal phase standpoint following general aspiration precautions w/ mech soft foods(beneficial for conservation of energy w/ mastication). HOWEVER, pt has a baseline of REFLUX on PPI and Mod size Hiatal Hernia w/ reports of Backflow episodes. ANY Dysmotility or Regurgitation of Reflux  material can increase risk for aspiration of the Reflux material during Retrograde backflow thus impact Pulmonary status.  Pt was supported upright in bed this evaluation and consumed few trials of puree/soft solids but ~6+ ozs of thin liquids Via Cup w/ no immediate, overt clinical s/s of aspiration noted; clear, gravely vocal quality b/t trials, no multiple swallows noted post initial pharyngeal swallow. Pt's Respiratory effort appeared mildly increased w/ the effort/exertion of the oral intake. Rest Breaks encouraged b/t every 1-2 trials in order to calm her breathing rate. Oral phase appeared [facility] for bolus management, mastication, and timely A-P transfer/clearing of material. Foods moistened well for ease of mastication. OM exam was [facility] for oral clearing; lingual/labial movements w/ bolus management. No unilateral weakness. Speech clear. Of Note, pt demonstrated fairly quick s/s of Esophageal dysmotility c/b "Fullness" and Belching w/ the min intake consumed.   Recommend continue Mech Soft diet for ease of choice of manageable foods as tolerates(w/ less meats/breads in diet, moistened foods) w/ thin liquids via Cup/less straw use d/t air swallowing; less Carbonated drinks especially at meals. Recommend general aspiration precautions. Rest Breaks during meals/oral intake to allow for Esophageal clearing and for conservation of energy. REFLUX precautions strongly recommended to lessen chance for Regurgitation. Recommend pt f/u w/ GI for education on the Esophageal dysmotility if needed. Disucssion and handouts given on Reflux, dysmotility. Dietician f/u for support. MD and NSG updated. MD to reconsult ST services if any new needs while admitted. SLP Visit Diagnosis: Dysphagia, unspecified (R13.10) (Esophageal phase Dysmotility)    Aspiration Risk  Mild aspiration risk;Risk for inadequate nutrition/hydration (reduced following REFLUX and aspiration precautions)    Diet Recommendation  Mech Soft diet for  ease of choice of manageable foods as tolerates(w/ less meats/breads in diet, moistened foods) w/ thin liquids via Cup/less straw use d/t air swallowing; less Carbonated drinks especially at meals. Recommend general aspiration precautions. Rest Breaks during meals/oral intake to allow for Esophageal clearing and for conservation of energy. REFLUX precautions strongly recommended to lessen chance for Regurgitation.   Medication Administration: Whole meds with puree (broken small for ease of swallowing)    Other  Recommendations Recommended Consults: Consider GI evaluation;Consider esophageal assessment (for further Education; Dietician support) Oral Care Recommendations: Oral care BID;Oral care before and after PO;Staff/trained caregiver to provide oral care (Denture care) Other Recommendations:  (n/a)   Follow up Recommendations None      Frequency and Duration  (n/a)   (n/a)       Prognosis Prognosis for Safe Diet Advancement: Fair (-Good) Barriers to Reach Goals: Time post onset;Severity of deficits (Baseline Hiatal Hernia, Reflux)      Swallow Study   General Date of Onset: 05/30/20 HPI: Pt 87 y.o. female with medical history of Mod. size Hiatal Hernia, COPD, depression, hypothyroidism, hypertension, HOH, recently treated with doxycycline/ prednisone for COPD exacerbation, presented with several-day history of progressively worsening shortness of breath, weakness w/ decreased O2 sats (80%).  Admitted for management of COPD as well as pneumonia.  CXR: "Bibasilar atelectasis and probable tiny pleural effusions.  RIGHT upper lobe infiltrate question pneumonia.  Moderate-sized hiatal hernia.". Type of Study: Bedside Swallow Evaluation Previous Swallow Assessment: none Diet Prior to this Study: Regular;Thin liquids (soft diet) Temperature Spikes Noted: No (wbc 7.6) Respiratory Status: Nasal cannula (2-4L) History of Recent Intubation: No Behavior/Cognition: Alert;Cooperative;Pleasant mood  (HOH) Oral Cavity Assessment: Within Functional Limits Oral Care Completed by SLP: Recent completion by staff Oral Cavity - Dentition: Dentures, top;Dentures, bottom Vision: Functional for self-feeding Self-Feeding Abilities: Able to feed self;Needs set up;Needs assist Patient Positioning: Upright in bed (needed support w/ positioning more Upright, pillow support) Baseline Vocal Quality:  (gravely) Volitional Cough: Strong;Congested (phlegmy quality and respirations baseline) Volitional Swallow: Able to elicit    Oral/Motor/Sensory Function Overall Oral Motor/Sensory Function: Within functional limits   Ice Chips Ice chips: Not tested   Thin Liquid Thin Liquid: Within functional limits Presentation: Cup;Self Fed (4-5 trials/swallows; then ~6 ozs of ensure)    Nectar Thick Nectar Thick Liquid: Not tested   Honey Thick Honey Thick Liquid: Not tested   Puree Puree: Within functional limits Presentation: Self Fed;Spoon (2 trials)   Solid     Solid: Within functional limits (grossly) Presentation: Self Fed;Spoon (2 trials)       Orinda Kenner, MS, CCC-SLP Speech Language Pathologist Rehab Services [phone] Sandra Brown 05/31/2020,2:29 PM

## 2020-05-31 NOTE — Progress Notes (Signed)
Initial Nutrition Assessment  DOCUMENTATION CODES:   Non-severe (moderate) malnutrition in context of chronic illness  INTERVENTION:   Glucerna Shake po TID, each supplement provides 220 kcal and 10 grams of protein  MVI po daily   NUTRITION DIAGNOSIS:   Moderate Malnutrition related to chronic illness (COPD, advanced age) as evidenced by mild fat depletion,moderate fat depletion,moderate muscle depletion,severe muscle depletion.  GOAL:   Patient will meet greater than or equal to 90% of their needs  MONITOR:   PO intake,Supplement acceptance,Labs,Weight trends,Skin,I & O's  REASON FOR ASSESSMENT:   Malnutrition Screening Tool    ASSESSMENT:   87 y.o. female with medical history significant for COPD, MDD, hypothyroidism, hypertension, CKD III, IBS and GERD who is admitted with COPD exacerbation and HCAP   Met with pt in room today. Pt reports decreased appetite and oral intake for several days pta. Pt reports that she is eating better in hospital. Pt reports eating 1/2 of a sandwich and a sherbet ice cream for lunch today. Pt reports that she drinks butter pecan and strawberry Glucerna daily at home; pt would like to have this in the hospital as well. RD will add supplements and MVI to help pt meet her estimated needs. Per chart, pt appears weight stable pta.   Medications reviewed and include: lovenox, synthroid, solu-medrol, protonix, cefepime, vancomycin   Labs reviewed: K 4.0 wnl Hgb 7.2(L), Hct 23.4(L)   NUTRITION - FOCUSED PHYSICAL EXAM:  Flowsheet Row Most Recent Value  Orbital Region Mild depletion  Upper Arm Region Moderate depletion  Thoracic and Lumbar Region Mild depletion  Buccal Region Mild depletion  Temple Region Moderate depletion  Clavicle Bone Region Severe depletion  Clavicle and Acromion Bone Region Severe depletion  Scapular Bone Region Severe depletion  Dorsal Hand Severe depletion  Patellar Region Severe depletion  Anterior Thigh Region  Severe depletion  Posterior Calf Region Severe depletion  Edema (RD Assessment) None  Hair Reviewed  Eyes Reviewed  Mouth Reviewed  Skin Reviewed  Nails Reviewed     Diet Order:   Diet Order            DIET SOFT Room service appropriate? Yes with Assist; Fluid consistency: Thin  Diet effective now                EDUCATION NEEDS:   Education needs have been addressed  Skin:  Skin Assessment: Reviewed RN Assessment (ecchymosis)  Last BM:  PTA  Height:   Ht Readings from Last 1 Encounters:  05/30/20 5' (1.524 m)    Weight:   Wt Readings from Last 1 Encounters:  05/31/20 50.6 kg    Ideal Body Weight:  45.45 kg  BMI:  Body mass index is 21.78 kg/m.  Estimated Nutritional Needs:   Kcal:  1200-1400kcal/day  Protein:  65-75g/day  Fluid:  1.2-1.4L/day  Koleen Distance MS, RD, LDN Please refer to [facility] for RD and/or RD on-call/weekend/after hours pager

## 2020-05-31 NOTE — Chronic Care Management (AMB) (Signed)
  Chronic Care Management   Follow Up Note    Name: RANDALL RAMPERSAD MRN: [id number] DOB: 1926-12-06  Referred by: Virginia Crews, MD Reason for referral : Chronic Care Management   STEPH CHEADLE is a 87 y.o. year old female who is a primary care patient of Bacigalupo, Dionne Bucy, MD. The CCM team was consulted for assistance with chronic disease management and care coordination.   A request was received from [facility] to complete a functional assessment. A brief telephonic outreach was conducted today with her daughter/caregiver [name].  Mrs. Kreiser is currently receiving rehabilitation services at [facility]. [name] received a call during our conversation today indicating Mrs. Occhipinti was experiencing shortness of breath and not feeling well. Reports she is currently unable to answer questions related to Mrs. Maes's anticipated functional status and potential long-term needs. Reports a pending meeting with the [facility] staff tomorrow to determine if she will be able to transition back to her previous living arrangements or if she will require long term skilled nursing services.   We were unable to complete the conversation as planned today. [name] agreed to follow-up after she meets with the [facility] staff on tomorrow.     PLAN Will contact [facility] to confirm requirements related to the functional assessment forms.   Cristy [facility] [phone]

## 2020-05-31 NOTE — Evaluation (Signed)
Physical Therapy Evaluation Patient Details Name: Sandra Brown MRN: [id number] DOB: 1926/11/23 Today's Date: 05/31/2020   History of Present Illness  87 y.o. female with medical history of COPD, depression, hypothyroidism, hypertension, recently treated with doxycycline/ prednisone for COPD exacerbation, presented with 3-day history of progressively worsening shortness of breath.  Admitted for management of COPD as well as pneumonia.  Clinical Impression  Pt showed very good effort and determination during PT exam.  She was able to maintain O2 in the high 90s on 2L at rest and with light in bed mobility, stats did drop <90% during in-room ambulation with increased resp rate and volume of breath sounds.  She reported significant fatigue with the effort and reports that typically she is able to do much more than she was able to today.  Pt did not necessarily need a lot of assist t/o the session but showed decreased activity tolerance and is far from her typical mobility baseline.  Recommending a stint in rehab before being able to safely return to her apartment.    Follow Up Recommendations SNF;Supervision/Assistance - 24 hour    Equipment Recommendations  None recommended by PT    Recommendations for Other Services       Precautions / Restrictions Precautions Precautions: Fall Restrictions Weight Bearing Restrictions: No      Mobility  Bed Mobility Overal bed mobility: Needs Assistance Bed Mobility: Supine to Sit     Supine to sit: Min assist     General bed mobility comments: Pt made a good effort in getting to EOB, ultimately needed light HHA to actually attain sitting    Transfers Overall transfer level: Needs assistance Equipment used: Rolling walker (2 wheeled) Transfers: Sit to/from Stand Sit to Stand: Min guard         General transfer comment: Pt able to rise to standing w/o direct physical assist  Ambulation/Gait Ambulation/Gait assistance: Min  assist Gait Distance (Feet): 35 Feet Assistive device: Rolling walker (2 wheeled)       General Gait Details: Pt was able to ambulate with hesitant but safe cadence to door and back. On 2L O2 t/o the effort with sats dropping to high 80s and HR up to ~120 by the end.  She endorsed fatigue/sweating and had signficant DOE with audible rhonchi  Stairs            Wheelchair Mobility    Modified Rankin (Stroke Patients Only)       Balance Overall balance assessment: Needs assistance   Sitting balance-Leahy Scale: Good Sitting balance - Comments: Pt with very pronounced kyphosis, appeared to have difficulty maintaining sitting posture due to this.   Standing balance support: Bilateral upper extremity supported Standing balance-Leahy Scale: Fair Standing balance comment: Pt leaning on walker but with it did not have LOBs or overt safety concerns                             Pertinent Vitals/Pain Pain Assessment:  (reports only soreness from being in bed)    Home Living Family/patient expects to be discharged to:: Assisted living               Home Equipment: Walker - 4 wheels      Prior Function Level of Independence: Needs assistance   Gait / Transfers Assistance Needed: Pt uses 4WW, reports she can do limited in facility ambulation w/o assist  ADL's / Homemaking Assistance Needed: unclear, but seems that she  has aides a few days a week to assist with bathing, etc        Hand Dominance        Extremity/Trunk Assessment   Upper Extremity Assessment Upper Extremity Assessment: Generalized weakness    Lower Extremity Assessment Lower Extremity Assessment: Generalized weakness    Cervical / Trunk Assessment Cervical / Trunk Assessment: Kyphotic  Communication   Communication: HOH  Cognition Arousal/Alertness: Awake/alert Behavior During Therapy: WFL for tasks assessed/performed Overall Cognitive Status: Within Functional Limits for tasks  assessed                                        General Comments General comments (skin integrity, edema, etc.): Pt with audible lung sounds, that became more pronounched with increased work.  HR 90s up to ~120 with activity, on 2L O2 sats dropped from high 90s to high 80s with modest activity.    Exercises     Assessment/Plan    PT Assessment Patient needs continued PT services  PT Problem List Decreased strength;Decreased range of motion;Decreased activity tolerance;Decreased balance;Decreased knowledge of use of DME;Decreased safety awareness;Cardiopulmonary status limiting activity       PT Treatment Interventions DME instruction;Gait training;Functional mobility training;Therapeutic activities;Therapeutic exercise;Balance training;Neuromuscular re-education;Patient/family education    PT Goals (Current goals can be found in the Care Plan section)  Acute Rehab PT Goals Patient Stated Goal: get breathing better and go home PT Goal Formulation: With patient Time For Goal Achievement: 06/14/20 Potential to Achieve Goals: Fair    Frequency Min 2X/week   Barriers to discharge        Co-evaluation               AM-PAC PT "6 Clicks" Mobility  Outcome Measure Help needed turning from your back to your side while in a flat bed without using bedrails?: None Help needed moving from lying on your back to sitting on the side of a flat bed without using bedrails?: A Little Help needed moving to and from a bed to a chair (including a wheelchair)?: A Little Help needed standing up from a chair using your arms (e.g., wheelchair or bedside chair)?: A Little Help needed to walk in hospital room?: A Little Help needed climbing 3-5 steps with a railing? : A Lot 6 Click Score: 18    End of Session Equipment Utilized During Treatment: Gait belt Activity Tolerance: Patient limited by fatigue Patient left: with call bell/phone within reach;with chair alarm set Nurse  Communication: Mobility status (O2 during activity) PT Visit Diagnosis: Muscle weakness (generalized) (M62.81);Difficulty in walking, not elsewhere classified (R26.2)    Time: 2563-8937 PT Time Calculation (min) (ACUTE ONLY): 32 min   Charges:   PT Evaluation $PT Eval Low Complexity: 1 Low PT Treatments $Gait Training: 8-22 mins        Kreg Shropshire, DPT 05/31/2020, 4:10 PM

## 2020-05-31 NOTE — Plan of Care (Signed)
  Problem: Health Behavior/Discharge Planning: Goal: Ability to manage health-related needs will improve Outcome: Progressing   

## 2020-05-31 NOTE — Progress Notes (Addendum)
[facility] PROGRESS NOTE   Sandra Brown IHK:[id number] DOB: 10-10-1926 DOA: 05/30/2020  PCP: Virginia Crews, MD  Brief History/Interval Summary: 87 y.o. female with medical history of COPD, depression, hypothyroidism, hypertension, recently treated with doxycycline/ prednisone for COPD exacerbation, presented with 3-day history of progressively worsening shortness of breath.  Admitted for management of COPD as well as pneumonia.  Consultants: None  Procedures: None  Antibiotics: Anti-infectives (From admission, onward)   Start     Dose/Rate Route Frequency Ordered Stop   05/31/20 1800  vancomycin (VANCOREADY) IVPB 500 mg/100 mL        500 mg 100 mL/hr over 60 Minutes Intravenous Every 24 hours 05/30/20 1741     05/31/20 0500  ceFEPIme (MAXIPIME) 2 g in sodium chloride 0.9 % 100 mL IVPB        2 g 200 mL/hr over 30 Minutes Intravenous Every 12 hours 05/30/20 1741     05/30/20 1615  vancomycin (VANCOCIN) IVPB 1000 mg/200 mL premix        1,000 mg 200 mL/hr over 60 Minutes Intravenous  Once 05/30/20 1613 05/30/20 1729   05/30/20 1615  ceFEPIme (MAXIPIME) 2 g in sodium chloride 0.9 % 100 mL IVPB        2 g 200 mL/hr over 30 Minutes Intravenous  Once 05/30/20 1613 05/30/20 1756      Subjective/Interval History: Patient denies any chest pain.  No nausea or vomiting.  Some shortness of breath is present.  Also complains of cough.    Assessment/Plan:  Acute respiratory failure with hypoxia/COPD with acute exacerbation/community-acquired pneumonia Patient apparently was started on home oxygen about 10 days prior to this admission for COPD and pneumonia.  She was treated with prednisone and doxycycline.  Presented with worsening symptoms.  X-ray shows right upper lobe infiltrates.  Patient started on broad-spectrum antibiotics with vancomycin and cefepime considering that she was on oral antibiotics recently.  Follow-up on culture data.  Respiratory status seems to  be better. Was requiring BiPAP yesterday but was transitioned to nasal cannula overnight.  Currently on 2 L. Lactic acid level was normal.  Procalcitonin 0.22.  WBC was elevated at 14.9 yesterday and improved to 7.6 today. COVID-19 PCR was negative.  Mildly elevated troponin Patient denies any chest pain.  Likely mild demand ischemia.  Follow-up on echocardiogram.  Do not anticipate any further work-up at this time.  Hypothyroidism Continue with levothyroxine.  Normocytic anemia Mild drop in hemoglobin is likely dilutional.  No evidence of overt bleeding.  Continue to monitor.  History of GERD Continue PPI   DVT Prophylaxis: Lovenox Code Status: DNR Family Communication: No family at bedside Disposition Plan: Hopefully return home when improved  Status is: Inpatient  Remains inpatient appropriate because:IV treatments appropriate due to intensity of illness or inability to take PO and Inpatient level of care appropriate due to severity of illness   Dispo: The patient is from: Home              Anticipated d/c is to: Home              Patient currently is not medically stable to d/c.   Difficult to place patient No     Medications:  Scheduled: . arformoterol  15 mcg Nebulization BID  . budesonide (PULMICORT) nebulizer solution  0.25 mg Nebulization BID  . cetirizine  10 mg Oral QPM  . [START ON 06/01/2020] enoxaparin (LOVENOX) injection  40 mg Subcutaneous Q24H  . fluticasone  2  spray Each Nare Daily  . guaiFENesin  600 mg Oral BID  . ipratropium-albuterol  3 mL Nebulization Q6H  . levothyroxine  50 mcg Oral Q0600  . methylPREDNISolone (SOLU-MEDROL) injection  40 mg Intravenous Q12H  . pantoprazole sodium  20 mg Oral BID  . sodium chloride flush  3 mL Intravenous Q12H  . triamcinolone cream   Topical BID  . venlafaxine XR  75 mg Oral Q breakfast   Continuous: . sodium chloride    . ceFEPime (MAXIPIME) IV Stopped (05/31/20 0534)  . vancomycin     DUK:GURKYH  chloride, acetaminophen **OR** acetaminophen, morphine injection, ondansetron **OR** ondansetron (ZOFRAN) IV, senna-docusate, sodium chloride flush   Objective:  Vital Signs  Vitals:   05/31/20 0337 05/31/20 0627 05/31/20 0752 05/31/20 0932  BP: (!) 123/58  126/62   Pulse: 80  90 100  Resp: 16  17 18   Temp: 97.7 F (36.5 C)  98.1 F (36.7 C)   TempSrc: Oral  Oral   SpO2: 100%  100% 98%  Weight:  50.6 kg    Height:        Intake/Output Summary (Last 24 hours) at 05/31/2020 1117 Last data filed at 05/31/2020 1052 Gross per 24 hour  Intake 100.06 ml  Output --  Net 100.06 ml   Filed Weights   05/30/20 1509 05/31/20 0627  Weight: 46 kg 50.6 kg    General appearance: Awake alert.  In no distress Resp: Mildly tachypneic.  Coarse breath sounds bilaterally with crackles in the right base.  Few wheezes.  No rhonchi. Cardio: S1-S2 is normal regular.  No S3-S4.  No rubs murmurs or bruit GI: Abdomen is soft.  Nontender nondistended.  Bowel sounds are present normal.  No masses organomegaly Extremities: No edema.  Full range of motion of lower extremities. Neurologic: No focal neurological deficits.    Lab Results:  Data Reviewed: I have personally reviewed following labs and imaging studies  CBC: Recent Labs  Lab 05/30/20 1512 05/31/20 0409  WBC 14.9* 7.6  NEUTROABS 13.6*  --   HGB 8.7* 7.2*  HCT 26.9* 23.4*  MCV 96.1 96.7  PLT 424* 062    Basic Metabolic Panel: Recent Labs  Lab 05/30/20 1512 05/31/20 0409  NA 134* 137  K 4.1 4.0  CL 97* 104  CO2 24 24  GLUCOSE 140* 190*  BUN 7* 12  CREATININE 0.83 0.85  CALCIUM 8.1* 7.9*    GFR: Estimated Creatinine Clearance: 31 mL/min (by C-G formula based on SCr of 0.85 mg/dL).  Liver Function Tests: Recent Labs  Lab 05/30/20 1512  AST 43*  ALT 57*  ALKPHOS 81  BILITOT 0.8  PROT 6.6  ALBUMIN 2.8*      Recent Results (from the past 240 hour(s))  Culture, blood (single)     Status: None (Preliminary  result)   Collection Time: 05/30/20  3:12 PM   Specimen: BLOOD  Result Value Ref Range Status   Specimen Description BLOOD LEFT ANTECUBITAL  Final   Special Requests   Final    BOTTLES DRAWN AEROBIC AND ANAEROBIC Blood Culture adequate volume   Culture   Final    NO GROWTH < 24 HOURS Performed at [facility], [address] [place], [place] [address]    Report Status PENDING  Incomplete  Resp Panel by RT-PCR (Flu A&B, Covid) Nasopharyngeal Swab     Status: None   Collection Time: 05/30/20  4:31 PM   Specimen: Nasopharyngeal Swab; Nasopharyngeal(NP) swabs in vial transport medium  Result Value Ref Range Status   SARS Coronavirus 2 by RT PCR NEGATIVE NEGATIVE Final    Comment: (NOTE) SARS-CoV-2 target nucleic acids are NOT DETECTED.  The SARS-CoV-2 RNA is generally detectable in upper respiratory specimens during the acute phase of infection. The lowest concentration of SARS-CoV-2 viral copies this assay can detect is 138 copies/mL. A negative result does not preclude SARS-Cov-2 infection and should not be used as the sole basis for treatment or other patient management decisions. A negative result may occur with  improper specimen collection/handling, submission of specimen other than nasopharyngeal swab, presence of viral mutation(s) within the areas targeted by this assay, and inadequate number of viral copies(<138 copies/mL). A negative result must be combined with clinical observations, patient history, and epidemiological information. The expected result is Negative.  Fact Sheet for Patients:  EntrepreneurPulse.com.au  Fact Sheet for Healthcare Providers:  IncredibleEmployment.be  This test is no t yet approved or cleared by the [place] FDA and  has been authorized for detection and/or diagnosis of SARS-CoV-2 by FDA under an Emergency Use Authorization (EUA). This EUA will remain  in effect (meaning this test can be  used) for the duration of the COVID-19 declaration under Section 564(b)(1) of the Act, 21 U.S.C.section 360bbb-3(b)(1), unless the authorization is terminated  or revoked sooner.       Influenza A by PCR NEGATIVE NEGATIVE Final   Influenza B by PCR NEGATIVE NEGATIVE Final    Comment: (NOTE) The Xpert Xpress SARS-CoV-2/FLU/RSV plus assay is intended as an aid in the diagnosis of influenza from Nasopharyngeal swab specimens and should not be used as a sole basis for treatment. Nasal washings and aspirates are unacceptable for Xpert Xpress SARS-CoV-2/FLU/RSV testing.  Fact Sheet for Patients: EntrepreneurPulse.com.au  Fact Sheet for Healthcare Providers: IncredibleEmployment.be  This test is not yet approved or cleared by the [place] FDA and has been authorized for detection and/or diagnosis of SARS-CoV-2 by FDA under an Emergency Use Authorization (EUA). This EUA will remain in effect (meaning this test can be used) for the duration of the COVID-19 declaration under Section 564(b)(1) of the Act, 21 U.S.C. section 360bbb-3(b)(1), unless the authorization is terminated or revoked.  Performed at [facility], [address] [place], [place] [address]   MRSA PCR Screening     Status: None   Collection Time: 05/31/20  9:57 AM   Specimen: Nasal Mucosa; Nasopharyngeal  Result Value Ref Range Status   MRSA by PCR NEGATIVE NEGATIVE Final    Comment:        The GeneXpert MRSA Assay (FDA approved for NASAL specimens only), is one component of a comprehensive MRSA colonization surveillance program. It is not intended to diagnose MRSA infection nor to guide or monitor treatment for MRSA infections. Performed at [facility], [address] [place], [place] [address]       Radiology Studies: DG Chest [place] 1 View  Result Date: 05/30/2020 CLINICAL DATA:  Shortness of breath EXAM: PORTABLE CHEST 1 VIEW COMPARISON:   Portable exam 1514 hours compared to 05/12/2020 FINDINGS: Upper normal heart size. Atherosclerotic calcification aorta. Moderate-sized hiatal hernia. Infiltrates identified in RIGHT upper lobe question pneumonia. Bibasilar atelectasis and probable tiny pleural effusions. No pneumothorax. Bones demineralized with advanced RIGHT glenohumeral degenerative changes, thoracolumbar scoliosis, and prior lumbar spinal augmentation procedures. IMPRESSION: Bibasilar atelectasis and probable tiny pleural effusions. RIGHT upper lobe infiltrate question pneumonia. Moderate-sized hiatal hernia. Electronically Signed   By: Lavonia Dana M.D.   On: 05/30/2020 15:48  LOS: 1 day   Jandi Swiger [employer] on [email or web address]  05/31/2020, 11:17 AM

## 2020-06-01 ENCOUNTER — Ambulatory Visit (INDEPENDENT_AMBULATORY_CARE_PROVIDER_SITE_OTHER): Payer: Medicare Other

## 2020-06-01 DIAGNOSIS — E039 Hypothyroidism, unspecified: Secondary | ICD-10-CM | POA: Diagnosis not present

## 2020-06-01 DIAGNOSIS — E440 Moderate protein-calorie malnutrition: Secondary | ICD-10-CM | POA: Insufficient documentation

## 2020-06-01 DIAGNOSIS — J9621 Acute and chronic respiratory failure with hypoxia: Secondary | ICD-10-CM | POA: Diagnosis not present

## 2020-06-01 DIAGNOSIS — J449 Chronic obstructive pulmonary disease, unspecified: Secondary | ICD-10-CM

## 2020-06-01 LAB — CBC
HCT: 23.7 % — ABNORMAL LOW (ref 36.0–46.0)
Hemoglobin: 7.4 g/dL — ABNORMAL LOW (ref 12.0–15.0)
MCH: 30.6 pg (ref 26.0–34.0)
MCHC: 31.2 g/dL (ref 30.0–36.0)
MCV: 97.9 fL (ref 80.0–100.0)
Platelets: 394 10*3/uL (ref 150–400)
RBC: 2.42 MIL/uL — ABNORMAL LOW (ref 3.87–5.11)
RDW: 15.6 % — ABNORMAL HIGH (ref 11.5–15.5)
WBC: 10.6 10*3/uL — ABNORMAL HIGH (ref 4.0–10.5)
nRBC: 0 % (ref 0.0–0.2)

## 2020-06-01 LAB — PROCALCITONIN: Procalcitonin: 0.16 ng/mL

## 2020-06-01 MED ORDER — CEFDINIR 300 MG PO CAPS
300.0000 mg | ORAL_CAPSULE | Freq: Two times a day (BID) | ORAL | Status: DC
  Administered 2020-06-01 – 2020-06-03 (×4): 300 mg via ORAL
  Filled 2020-06-01 (×5): qty 1

## 2020-06-01 NOTE — TOC Initial Note (Signed)
Transition of Care [facility]) - Initial/Assessment Note    Patient Details  Name: Sandra Brown MRN: [id number] Date of Birth: Feb 29, 1926  Transition of Care [facility]) CM/SW Contact:    Eileen Stanford, LCSW Phone Number: 06/01/2020, 9:30 AM  Clinical Narrative: CSW spoke with pt's daughter via telephone. Pt's daughter states pt is from [facility] and was in the SNF prior to admission. Pt's daughter states the plan is for pt to return to the SNF at d/c. CSW will reach out to [facility].                  Expected Discharge Plan: [place] Barriers to Discharge: Continued Medical Work up   Patient Goals and CMS Choice Patient states their goals for this hospitalization and ongoing recovery are:: for pt to return to [facility]- per daughter   Choice offered to / list presented to : Adult Children  Expected Discharge Plan and Services Expected Discharge Plan: [place] In-house Referral: NA   Post Acute Care Choice: [place] Living arrangements for the past 2 months: [place]                                      Prior Living Arrangements/Services Living arrangements for the past 2 months: [place] Lives with:: Facility Resident Patient language and need for interpreter reviewed:: Yes Do you feel safe going back to the place where you live?: Yes      Need for Family Participation in Patient Care: Yes (Comment) Care giver support system in place?: Yes (comment)   Criminal Activity/Legal Involvement Pertinent to Current Situation/Hospitalization: No - Comment as needed  Activities of Daily Living Home Assistive Devices/Equipment: Walker (specify type) ADL Screening (condition at time of admission) Patient's cognitive ability adequate to safely complete daily activities?: No Is the patient deaf or have difficulty hearing?: Yes Does the patient have difficulty seeing, even when wearing  glasses/contacts?: No Does the patient have difficulty concentrating, remembering, or making decisions?: No Patient able to express need for assistance with ADLs?: Yes Does the patient have difficulty dressing or bathing?: Yes Independently performs ADLs?: No Communication: Independent Dressing (OT): Needs assistance Grooming: Needs assistance,Appropriate for developmental age Feeding: Independent Bathing: Appropriate for developmental age,Needs assistance Toileting: Appropriate for developmental age,Needs assistance In/Out Bed: Appropriate for developmental age,Needs assistance Walks in Home: Needs assistance Does the patient have difficulty walking or climbing stairs?: Yes Weakness of Legs: Both Weakness of Arms/Hands: Both  Permission Sought/Granted Permission sought to share information with : Family Supports Permission granted to share information with : Yes, Release of Information Signed  Share Information with NAME: [name]  Permission granted to share info w AGENCY: [facility]  Permission granted to share info w Relationship: daughter     Emotional Assessment Appearance:: Appears stated age Attitude/Demeanor/Rapport: Unable to Assess Affect (typically observed): Unable to Assess Orientation: : Oriented to Self,Oriented to Place Alcohol / Substance Use: Not Applicable Psych Involvement: No (comment)  Admission diagnosis:  Shortness of breath [R06.02] Respiratory failure, acute ([place]) [J96.00] Pneumonia [J18.9] HCAP (healthcare-associated pneumonia) [J18.9] Acute on chronic respiratory failure with hypoxia ([place]) [J96.21] Patient Active Problem List   Diagnosis Date Noted  . Malnutrition of moderate degree 06/01/2020  . HCAP (healthcare-associated pneumonia) 05/30/2020  . Acute on chronic respiratory failure with hypoxia ([place]) 05/30/2020  . Respiratory failure, acute ([place]) 05/30/2020  .  Pneumonia 05/30/2020  . Cellulitis of right lower leg 03/29/2020  . MDD (major  depressive disorder), recurrent, in full remission ([place]) 11/11/2019  . Panic attacks 03/16/2019  . MDD (major depressive disorder), recurrent episode, mild ([place]) 09/30/2018  . Insomnia due to mental condition 09/30/2018  . Skin-picking disorder 09/30/2018  . Lymphedema 01/27/2018  . Chronic venous insufficiency 01/27/2018  . Cellulitis of leg, left 12/23/2017  . Swelling of limb 12/23/2017  . CKD (chronic kidney disease) stage 3, GFR 30-59 ml/min (HCC) 10/02/2015  . Hypothyroidism 10/02/2015  . IBS (irritable bowel syndrome) 12/15/2014  . Allergic rhinitis 11/14/2014  . Facet syndrome, lumbar 09/21/2014  . Sacroiliac joint dysfunction 09/21/2014  . Hyperlipemia 09/01/2014  . Depression 08/29/2014  . Iron deficiency anemia 07/14/2014  . GERD (gastroesophageal reflux disease) 07/14/2014  . COPD (chronic obstructive pulmonary disease) ([place]) 06/24/2014  . Incomplete bladder emptying 12/09/2012  . Urge incontinence 12/09/2012  . Obstruction of urinary tract 12/09/2012   PCP:  Virginia Crews, MD Pharmacy:   [place], [place] [address] Phone: [phone] Fax: [id number] [place], [place] - [place] AT [place] [address] [place] [address] Phone: [phone] Fax: [phone]  [place], [place] [address] Phone: [phone] Fax: [phone]     Social Determinants of Health (SDOH) Interventions    Readmission Risk Interventions No flowsheet data found.

## 2020-06-01 NOTE — Progress Notes (Signed)
[facility] PROGRESS NOTE   MCKINSEY KEAGLE GEZ:[id number] DOB: 1926-08-19 DOA: 05/30/2020  PCP: Virginia Crews, MD  Brief History/Interval Summary: 87 y.o. female with medical history of COPD, depression, hypothyroidism, hypertension, recently treated with doxycycline/ prednisone for COPD exacerbation, presented with 3-day history of progressively worsening shortness of breath.  Admitted for management of COPD as well as pneumonia.  Consultants: None  Procedures: None  Antibiotics: Anti-infectives (From admission, onward)   Start     Dose/Rate Route Frequency Ordered Stop   05/31/20 1800  vancomycin (VANCOREADY) IVPB 500 mg/100 mL        500 mg 100 mL/hr over 60 Minutes Intravenous Every 24 hours 05/30/20 1741     05/31/20 0500  ceFEPIme (MAXIPIME) 2 g in sodium chloride 0.9 % 100 mL IVPB        2 g 200 mL/hr over 30 Minutes Intravenous Every 12 hours 05/30/20 1741     05/30/20 1615  vancomycin (VANCOCIN) IVPB 1000 mg/200 mL premix        1,000 mg 200 mL/hr over 60 Minutes Intravenous  Once 05/30/20 1613 05/30/20 1729   05/30/20 1615  ceFEPIme (MAXIPIME) 2 g in sodium chloride 0.9 % 100 mL IVPB        2 g 200 mL/hr over 30 Minutes Intravenous  Once 05/30/20 1613 05/30/20 1756      Subjective/Interval History: Patient mentions that she is feeling better.  Less shortness of breath compared to yesterday.  Less cough as well.  No chest pain.      Assessment/Plan:  Acute respiratory failure with hypoxia/COPD with acute exacerbation/community-acquired pneumonia Patient apparently was started on home oxygen about 10 days prior to this admission for COPD and pneumonia.  She was treated with prednisone and doxycycline.  Presented with worsening symptoms.  X-ray shows right upper lobe infiltrates.  Patient started on broad-spectrum antibiotics with vancomycin and cefepime considering that she was on oral antibiotics recently.  Cultures have been negative so far.   Respiratory status appears to be better.  Required BiPAP briefly but not in the last 24 hours.  Remains on 2 to 3 L of oxygen.  Lactic acid level was normal.  Procalcitonin 0.22.   COVID-19 PCR was negative. We will change her to [facility]  Mildly elevated troponin Patient denies any chest pain.  Likely mild demand ischemia.  Echocardiogram shows normal systolic function with no regional wall motion abnormalities.  No further work-up at this time   Hypothyroidism Continue with levothyroxine.  Normocytic anemia Patient on previous labs it looks like patient has a longstanding history of anemia.  Anemia panel checked in September 2021 did not conclusively suggest iron deficiency.  Y50 and folic acid levels have been normal within the last year or so.   Drop in hemoglobin noted which is most likely still dilutional.  No overt bleeding is appreciated. We will recheck hemoglobin later today and tomorrow.      History of GERD Continue PPI   DVT Prophylaxis: Lovenox Code Status: DNR Family Communication: No family at bedside.  Will update family later today. Disposition Plan: Will return to her SNF.  Status is: Inpatient  Remains inpatient appropriate because:IV treatments appropriate due to intensity of illness or inability to take PO and Inpatient level of care appropriate due to severity of illness   Dispo: The patient is from: SNF              Anticipated d/c is to: SNF  Patient currently is not medically stable to d/c.   Difficult to place patient No     Medications:  Scheduled: . arformoterol  15 mcg Nebulization BID  . budesonide (PULMICORT) nebulizer solution  0.25 mg Nebulization BID  . cetirizine  10 mg Oral QPM  . enoxaparin (LOVENOX) injection  40 mg Subcutaneous Q24H  . feeding supplement (GLUCERNA SHAKE)  237 mL Oral TID BM  . fluticasone  2 spray Each Nare Daily  . guaiFENesin  600 mg Oral BID  . ipratropium-albuterol  3 mL Nebulization BID  .  levothyroxine  50 mcg Oral Q0600  . methylPREDNISolone (SOLU-MEDROL) injection  40 mg Intravenous Q12H  . multivitamin with minerals  1 tablet Oral Daily  . pantoprazole sodium  20 mg Oral BID  . sodium chloride flush  3 mL Intravenous Q12H  . triamcinolone cream   Topical BID  . venlafaxine XR  75 mg Oral Q breakfast   Continuous: . sodium chloride 250 mL (05/31/20 2258)  . ceFEPime (MAXIPIME) IV 2 g (06/01/20 1028)  . vancomycin Stopped (05/31/20 1825)   TFT:DDUKGU chloride, acetaminophen **OR** acetaminophen, morphine injection, ondansetron **OR** ondansetron (ZOFRAN) IV, senna-docusate, sodium chloride flush   Objective:  Vital Signs  Vitals:   05/31/20 1528 05/31/20 2033 06/01/20 0632 06/01/20 0849  BP: 129/70 (!) 141/68 138/69 122/61  Pulse: 90 93 88 95  Resp: 18 18 20 18   Temp: 98.1 F (36.7 C) (!) 97.5 F (36.4 C) 97.6 F (36.4 C) 97.9 F (36.6 C)  TempSrc: Oral  Oral   SpO2: 98% 98% 100% 100%  Weight:      Height:        Intake/Output Summary (Last 24 hours) at 06/01/2020 1039 Last data filed at 06/01/2020 0940 Gross per 24 hour  Intake 800.24 ml  Output 1000 ml  Net -199.76 ml   Filed Weights   05/30/20 1509 05/31/20 0627  Weight: 46 kg 50.6 kg    General appearance: Awake alert.  In no distress Resp: Improved effort.  Good air entry bilaterally.  Less crackles compared to yesterday.  No wheezing.  No rhonchi. Cardio: S1-S2 is normal regular.  No S3-S4.  No rubs murmurs or bruit GI: Abdomen is soft.  Nontender nondistended.  Bowel sounds are present normal.  No masses organomegaly Extremities: No edema.  Full range of motion of lower extremities. Neurologic:  No focal neurological deficits.     Lab Results:  Data Reviewed: I have personally reviewed following labs and imaging studies  CBC: Recent Labs  Lab 05/30/20 1512 05/31/20 0409  WBC 14.9* 7.6  NEUTROABS 13.6*  --   HGB 8.7* 7.2*  HCT 26.9* 23.4*  MCV 96.1 96.7  PLT 424* 360     Basic Metabolic Panel: Recent Labs  Lab 05/30/20 1512 05/31/20 0409  NA 134* 137  K 4.1 4.0  CL 97* 104  CO2 24 24  GLUCOSE 140* 190*  BUN 7* 12  CREATININE 0.83 0.85  CALCIUM 8.1* 7.9*    GFR: Estimated Creatinine Clearance: 31 mL/min (by C-G formula based on SCr of 0.85 mg/dL).  Liver Function Tests: Recent Labs  Lab 05/30/20 1512  AST 43*  ALT 57*  ALKPHOS 81  BILITOT 0.8  PROT 6.6  ALBUMIN 2.8*      Recent Results (from the past 240 hour(s))  Culture, blood (single)     Status: None (Preliminary result)   Collection Time: 05/30/20  3:12 PM   Specimen: BLOOD  Result Value Ref Range  Status   Specimen Description BLOOD LEFT ANTECUBITAL  Final   Special Requests   Final    BOTTLES DRAWN AEROBIC AND ANAEROBIC Blood Culture adequate volume   Culture   Final    NO GROWTH < 24 HOURS Performed at [facility], [address] [place], [place] [address]    Report Status PENDING  Incomplete  Resp Panel by RT-PCR (Flu A&B, Covid) Nasopharyngeal Swab     Status: None   Collection Time: 05/30/20  4:31 PM   Specimen: Nasopharyngeal Swab; Nasopharyngeal(NP) swabs in vial transport medium  Result Value Ref Range Status   SARS Coronavirus 2 by RT PCR NEGATIVE NEGATIVE Final    Comment: (NOTE) SARS-CoV-2 target nucleic acids are NOT DETECTED.  The SARS-CoV-2 RNA is generally detectable in upper respiratory specimens during the acute phase of infection. The lowest concentration of SARS-CoV-2 viral copies this assay can detect is 138 copies/mL. A negative result does not preclude SARS-Cov-2 infection and should not be used as the sole basis for treatment or other patient management decisions. A negative result may occur with  improper specimen collection/handling, submission of specimen other than nasopharyngeal swab, presence of viral mutation(s) within the areas targeted by this assay, and inadequate number of viral copies(<138 copies/mL). A negative  result must be combined with clinical observations, patient history, and epidemiological information. The expected result is Negative.  Fact Sheet for Patients:  EntrepreneurPulse.com.au  Fact Sheet for Healthcare Providers:  IncredibleEmployment.be  This test is no t yet approved or cleared by the [place] FDA and  has been authorized for detection and/or diagnosis of SARS-CoV-2 by FDA under an Emergency Use Authorization (EUA). This EUA will remain  in effect (meaning this test can be used) for the duration of the COVID-19 declaration under Section 564(b)(1) of the Act, 21 U.S.C.section 360bbb-3(b)(1), unless the authorization is terminated  or revoked sooner.       Influenza A by PCR NEGATIVE NEGATIVE Final   Influenza B by PCR NEGATIVE NEGATIVE Final    Comment: (NOTE) The Xpert Xpress SARS-CoV-2/FLU/RSV plus assay is intended as an aid in the diagnosis of influenza from Nasopharyngeal swab specimens and should not be used as a sole basis for treatment. Nasal washings and aspirates are unacceptable for Xpert Xpress SARS-CoV-2/FLU/RSV testing.  Fact Sheet for Patients: EntrepreneurPulse.com.au  Fact Sheet for Healthcare Providers: IncredibleEmployment.be  This test is not yet approved or cleared by the [place] FDA and has been authorized for detection and/or diagnosis of SARS-CoV-2 by FDA under an Emergency Use Authorization (EUA). This EUA will remain in effect (meaning this test can be used) for the duration of the COVID-19 declaration under Section 564(b)(1) of the Act, 21 U.S.C. section 360bbb-3(b)(1), unless the authorization is terminated or revoked.  Performed at [facility], [address] [place], [place] [address]   MRSA PCR Screening     Status: None   Collection Time: 05/31/20  9:57 AM   Specimen: Nasal Mucosa; Nasopharyngeal  Result Value Ref Range Status    MRSA by PCR NEGATIVE NEGATIVE Final    Comment:        The GeneXpert MRSA Assay (FDA approved for NASAL specimens only), is one component of a comprehensive MRSA colonization surveillance program. It is not intended to diagnose MRSA infection nor to guide or monitor treatment for MRSA infections. Performed at [facility], [address] [place], [place] [address]       Radiology Studies: [facility] Chest [place] [address]  Result Date: 05/30/2020 CLINICAL DATA:  Shortness of breath EXAM: PORTABLE CHEST 1 VIEW COMPARISON:  Portable exam 1514 hours compared to 05/12/2020 FINDINGS: Upper normal heart size. Atherosclerotic calcification aorta. Moderate-sized hiatal hernia. Infiltrates identified in RIGHT upper lobe question pneumonia. Bibasilar atelectasis and probable tiny pleural effusions. No pneumothorax. Bones demineralized with advanced RIGHT glenohumeral degenerative changes, thoracolumbar scoliosis, and prior lumbar spinal augmentation procedures. IMPRESSION: Bibasilar atelectasis and probable tiny pleural effusions. RIGHT upper lobe infiltrate question pneumonia. Moderate-sized hiatal hernia. Electronically Signed   By: Lavonia Dana M.D.   On: 05/30/2020 15:48   ECHOCARDIOGRAM COMPLETE  Result Date: 05/31/2020    ECHOCARDIOGRAM REPORT   Patient Name:   [name] Date of Exam: 05/30/2020 Medical Rec #:  [id number]         Height:       60.0 in Accession #:    [id number]        Weight:       101.4 lb Date of Birth:  1926/04/10        BSA:          1.399 m Patient Age:    52 years          BP:           136/59 mmHg Patient Gender: F                 HR:           99 bpm. Exam Location:  [facility] Procedure: 2D Echo, Cardiac Doppler and Color Doppler Indications:     R06.00 Dyspnea  History:         Patient has no prior history of Echocardiogram examinations.                  COPD; Risk Factors:Hypertension. Hypothyroidism.  Sonographer:     Wilford Sports Rodgers-Jones Referring Phys:  [id number] Elmarie Shiley Diagnosing Phys: Serafina Royals MD IMPRESSIONS  1. Left ventricular ejection fraction, by estimation, is 60 to 65%. The left ventricle has normal function. The left ventricle has no regional wall motion abnormalities. Left ventricular diastolic parameters were normal.  2. Right ventricular systolic function is normal. The right ventricular size is normal.  3. Left atrial size was mildly dilated.  4. The mitral valve is normal in structure. Mild to moderate mitral valve regurgitation.  5. The aortic valve is normal in structure. Aortic valve regurgitation is not visualized. FINDINGS  Left Ventricle: Left ventricular ejection fraction, by estimation, is 60 to 65%. The left ventricle has normal function. The left ventricle has no regional wall motion abnormalities. The left ventricular internal cavity size was normal in size. There is  no left ventricular hypertrophy. Left ventricular diastolic parameters were normal. Right Ventricle: The right ventricular size is normal. No increase in right ventricular wall thickness. Right ventricular systolic function is normal. Left Atrium: Left atrial size was mildly dilated. Right Atrium: Right atrial size was normal in size. Pericardium: There is no evidence of pericardial effusion. Mitral Valve: The mitral valve is normal in structure. Mild to moderate mitral valve regurgitation. Tricuspid Valve: The tricuspid valve is normal in structure. Tricuspid valve regurgitation is mild. Aortic Valve: The aortic valve is normal in structure. Aortic valve regurgitation is not visualized. Pulmonic Valve: The pulmonic valve was normal in structure. Pulmonic valve regurgitation is trivial. Aorta: The aortic root and ascending aorta are structurally normal, with no evidence of dilitation. IAS/Shunts: No atrial level shunt detected by color flow Doppler.  LEFT VENTRICLE PLAX 2D LVIDd:         3.44 cm  Diastology LVIDs:         2.16 cm  LV e' medial:    6.09 cm/s LV PW:         0.76  cm  LV E/e' medial:  21.2 LV IVS:        0.90 cm  LV e' lateral:   4.46 cm/s LVOT diam:     1.80 cm  LV E/e' lateral: 28.9 LV SV:         43 LV SV Index:   31 LVOT Area:     2.54 cm  RIGHT VENTRICLE RV Basal diam:  2.94 cm RV S prime:     14.55 cm/s TAPSE (M-mode): 1.7 cm LEFT ATRIUM             Index       RIGHT ATRIUM          Index LA diam:        4.70 cm 3.36 cm/m  RA Area:     7.52 cm LA Vol (A2C):   49.2 ml 35.18 ml/m RA Volume:   13.00 ml 9.29 ml/m LA Vol (A4C):   64.7 ml 46.26 ml/m LA Biplane Vol: 60.0 ml 42.90 ml/m  AORTIC VALVE LVOT Vmax:   105.00 cm/s LVOT Vmean:  78.100 cm/s LVOT VTI:    0.170 m  AORTA Ao Root diam: 2.90 cm MITRAL VALVE                TRICUSPID VALVE MV Area (PHT): 3.91 cm     TR Peak grad:   22.8 mmHg MV Decel Time: 194 msec     TR Vmax:        239.00 cm/s MV E velocity: 129.00 cm/s MV A velocity: 119.00 cm/s  SHUNTS MV E/A ratio:  1.08         Systemic VTI:  0.17 m                             Systemic Diam: 1.80 cm Serafina Royals MD Electronically signed by Serafina Royals MD Signature Date/Time: 05/31/2020/12:34:51 PM    Final        LOS: 2 days   [facility] Pager on [email or web address]  06/01/2020, 10:39 AM

## 2020-06-01 NOTE — NC FL2 (Signed)
[place] LEVEL OF CARE SCREENING TOOL     IDENTIFICATION  Patient Name: Sandra Brown Birthdate: 1926/06/17 Sex: female Admission Date (Current Location): 05/30/2020  [place] and [place] Number:  Engineering geologist and Address:  [facility], [address] [place], [place] [address]      Provider Number: [id number]  Attending Physician Name and Address:  Bonnielee Haff, MD  Relative Name and Phone Number:       Current Level of Care: Hospital Recommended Level of Care: [place] Prior Approval Number:    Date Approved/Denied:   PASRR Number:    Discharge Plan: SNF    Current Diagnoses: Patient Active Problem List   Diagnosis Date Noted  . Malnutrition of moderate degree 06/01/2020  . HCAP (healthcare-associated pneumonia) 05/30/2020  . Acute on chronic respiratory failure with hypoxia ([place]) 05/30/2020  . Respiratory failure, acute ([place]) 05/30/2020  . Pneumonia 05/30/2020  . Cellulitis of right lower leg 03/29/2020  . MDD (major depressive disorder), recurrent, in full remission ([place]) 11/11/2019  . Panic attacks 03/16/2019  . MDD (major depressive disorder), recurrent episode, mild ([place]) 09/30/2018  . Insomnia due to mental condition 09/30/2018  . Skin-picking disorder 09/30/2018  . Lymphedema 01/27/2018  . Chronic venous insufficiency 01/27/2018  . Cellulitis of leg, left 12/23/2017  . Swelling of limb 12/23/2017  . CKD (chronic kidney disease) stage 3, GFR 30-59 ml/min (HCC) 10/02/2015  . Hypothyroidism 10/02/2015  . IBS (irritable bowel syndrome) 12/15/2014  . Allergic rhinitis 11/14/2014  . Facet syndrome, lumbar 09/21/2014  . Sacroiliac joint dysfunction 09/21/2014  . Hyperlipemia 09/01/2014  . Depression 08/29/2014  . Iron deficiency anemia 07/14/2014  . GERD (gastroesophageal reflux disease) 07/14/2014  . COPD (chronic obstructive pulmonary disease) ([place]) 06/24/2014  . Incomplete bladder  emptying 12/09/2012  . Urge incontinence 12/09/2012  . Obstruction of urinary tract 12/09/2012    Orientation RESPIRATION BLADDER Height & Weight     Self,Place  O2 (4L) External catheter,Incontinent (placed 5/25) Weight: 111 lb 8 oz (50.6 kg) Height:  5' (152.4 cm)  BEHAVIORAL SYMPTOMS/MOOD NEUROLOGICAL BOWEL NUTRITION STATUS      Continent Diet (DYS 3, thin liquids)  AMBULATORY STATUS COMMUNICATION OF NEEDS Skin   Limited Assist Verbally Normal                       Personal Care Assistance Level of Assistance  Bathing,Feeding,Dressing Bathing Assistance: Limited assistance Feeding assistance: Independent Dressing Assistance: Limited assistance     Functional Limitations Info  Sight,Hearing,Speech Sight Info: Adequate Hearing Info: Adequate Speech Info: Adequate    SPECIAL CARE FACTORS FREQUENCY  PT (By licensed PT),OT (By licensed OT)     PT Frequency: 5x OT Frequency: 5x            Contractures Contractures Info: Not present    Additional Factors Info  Code Status,Allergies Code Status Info: DNR Allergies Info: Sulfa Antibiotics, Erythromycin, Aspirin, Contrast Media (Iodinated Diagnostic Agents), Latex, Penicillin G, Penicillins, Tape           Current Medications (06/01/2020):  This is the current hospital active medication list Current Facility-Administered Medications  Medication Dose Route Frequency Provider Last Rate Last Admin  . 0.9 %  sodium chloride infusion  250 mL Intravenous PRN Regalado, Belkys A, MD 5 mL/hr at 05/31/20 2258 250 mL at 05/31/20 2258  . acetaminophen (TYLENOL) tablet 650 mg  650 mg Oral Q6H PRN Regalado, Cassie Freer, MD  Or  . acetaminophen (TYLENOL) suppository 650 mg  650 mg Rectal Q6H PRN Regalado, Belkys A, MD      . arformoterol (BROVANA) nebulizer solution 15 mcg  15 mcg Nebulization BID Regalado, Belkys A, MD   15 mcg at 06/01/20 0838  . budesonide (PULMICORT) nebulizer solution 0.25 mg  0.25 mg Nebulization BID  Regalado, Belkys A, MD   0.25 mg at 06/01/20 0826  . ceFEPIme (MAXIPIME) 2 g in sodium chloride 0.9 % 100 mL IVPB  2 g Intravenous Q12H Lorna Dibble, RPH   Stopped at 06/01/20 0177  . cetirizine (ZYRTEC) tablet 10 mg  10 mg Oral QPM Regalado, Belkys A, MD   10 mg at 05/31/20 1718  . enoxaparin (LOVENOX) injection 40 mg  40 mg Subcutaneous Q24H Oswald Hillock, RPH   40 mg at 06/01/20 9390  . feeding supplement (GLUCERNA SHAKE) (GLUCERNA SHAKE) liquid 237 mL  237 mL Oral TID BM Bonnielee Haff, MD   237 mL at 06/01/20 0853  . fluticasone (FLONASE) 50 MCG/ACT nasal spray 2 spray  2 spray Each Nare Daily Regalado, Belkys A, MD   2 spray at 06/01/20 0853  . guaiFENesin (MUCINEX) 12 hr tablet 600 mg  600 mg Oral BID Regalado, Belkys A, MD   600 mg at 06/01/20 0853  . ipratropium-albuterol (DUONEB) 0.5-2.5 (3) MG/3ML nebulizer solution 3 mL  3 mL Nebulization BID Bonnielee Haff, MD   3 mL at 06/01/20 0825  . levothyroxine (SYNTHROID) tablet 50 mcg  50 mcg Oral Q0600 Regalado, Belkys A, MD   50 mcg at 06/01/20 0612  . methylPREDNISolone sodium succinate (SOLU-MEDROL) 40 mg/mL injection 40 mg  40 mg Intravenous Q12H Regalado, Belkys A, MD   40 mg at 06/01/20 0612  . morphine 2 MG/ML injection 0.5 mg  0.5 mg Intravenous Q3H PRN Regalado, Belkys A, MD      . multivitamin with minerals tablet 1 tablet  1 tablet Oral Daily Bonnielee Haff, MD   1 tablet at 06/01/20 [phone]  . ondansetron (ZOFRAN) tablet 4 mg  4 mg Oral Q6H PRN Regalado, Belkys A, MD       Or  . ondansetron (ZOFRAN) injection 4 mg  4 mg Intravenous Q6H PRN Regalado, Belkys A, MD      . pantoprazole sodium (PROTONIX) 40 mg/20 mL oral suspension 20 mg  20 mg Oral BID Regalado, Belkys A, MD   20 mg at 05/31/20 2249  . senna-docusate (Senokot-S) tablet 1 tablet  1 tablet Oral QHS PRN Regalado, Belkys A, MD      . sodium chloride flush (NS) 0.9 % injection 3 mL  3 mL Intravenous Q12H Regalado, Belkys A, MD   3 mL at 06/01/20 0853  . sodium  chloride flush (NS) 0.9 % injection 3 mL  3 mL Intravenous PRN Regalado, Belkys A, MD      . triamcinolone cream (KENALOG) 0.1 % cream   Topical BID Regalado, Belkys A, MD   Given at 06/01/20 0853  . vancomycin (VANCOREADY) IVPB 500 mg/100 mL  500 mg Intravenous Q24H Lorna Dibble, [facility]   Stopped at 05/31/20 1825  . venlafaxine XR (EFFEXOR-XR) 24 hr capsule 75 mg  75 mg Oral Q breakfast Regalado, Belkys A, MD   75 mg at 06/01/20 2330     Discharge Medications: Please see discharge summary for a list of discharge medications.  Relevant Imaging Results:  Relevant Lab Results:   Additional Information SSN:[id number]  Eileen Stanford, LCSW

## 2020-06-02 DIAGNOSIS — J9621 Acute and chronic respiratory failure with hypoxia: Secondary | ICD-10-CM | POA: Diagnosis not present

## 2020-06-02 DIAGNOSIS — J449 Chronic obstructive pulmonary disease, unspecified: Secondary | ICD-10-CM | POA: Diagnosis not present

## 2020-06-02 DIAGNOSIS — E039 Hypothyroidism, unspecified: Secondary | ICD-10-CM | POA: Diagnosis not present

## 2020-06-02 LAB — CBC
HCT: 22.1 % — ABNORMAL LOW (ref 36.0–46.0)
HCT: 31 % — ABNORMAL LOW (ref 36.0–46.0)
Hemoglobin: 6.9 g/dL — ABNORMAL LOW (ref 12.0–15.0)
Hemoglobin: 10.2 g/dL — ABNORMAL LOW (ref 12.0–15.0)
MCH: 30.9 pg (ref 26.0–34.0)
MCH: 30.6 pg (ref 26.0–34.0)
MCHC: 31.2 g/dL (ref 30.0–36.0)
MCHC: 32.9 g/dL (ref 30.0–36.0)
MCV: 99.1 fL (ref 80.0–100.0)
MCV: 93.1 fL (ref 80.0–100.0)
Platelets: 363 10*3/uL (ref 150–400)
Platelets: 397 10*3/uL (ref 150–400)
RBC: 2.23 MIL/uL — ABNORMAL LOW (ref 3.87–5.11)
RBC: 3.33 MIL/uL — ABNORMAL LOW (ref 3.87–5.11)
RDW: 15.7 % — ABNORMAL HIGH (ref 11.5–15.5)
RDW: 16.8 % — ABNORMAL HIGH (ref 11.5–15.5)
WBC: 9 10*3/uL (ref 4.0–10.5)
WBC: 11.7 10*3/uL — ABNORMAL HIGH (ref 4.0–10.5)
nRBC: 0 % (ref 0.0–0.2)
nRBC: 0 % (ref 0.0–0.2)

## 2020-06-02 LAB — BASIC METABOLIC PANEL WITH GFR
Anion gap: 8 (ref 5–15)
BUN: 22 mg/dL (ref 8–23)
CO2: 24 mmol/L (ref 22–32)
Calcium: 8.3 mg/dL — ABNORMAL LOW (ref 8.9–10.3)
Chloride: 110 mmol/L (ref 98–111)
Creatinine, Ser: 0.71 mg/dL (ref 0.44–1.00)
GFR, Estimated: >60 mL/min
Glucose, Bld: 139 mg/dL — ABNORMAL HIGH (ref 70–99)
Potassium: 3.8 mmol/L (ref 3.5–5.1)
Sodium: 142 mmol/L (ref 135–145)

## 2020-06-02 LAB — PREPARE RBC (CROSSMATCH)

## 2020-06-02 LAB — ABO/RH: ABO/RH(D): A POS

## 2020-06-02 MED ORDER — GLUCERNA SHAKE PO LIQD: 237.0000 mL | Freq: Three times a day (TID) | ORAL | 0 refills | Status: AC

## 2020-06-02 MED ORDER — SODIUM CHLORIDE 0.9% IV SOLUTION: Freq: Once | INTRAVENOUS | Status: AC

## 2020-06-02 MED ORDER — ADULT MULTIVITAMIN W/MINERALS CH: 1.0000 | ORAL_TABLET | Freq: Every day | ORAL | Status: AC

## 2020-06-02 MED ORDER — FUROSEMIDE 10 MG/ML IJ SOLN
20.0000 mg | Freq: Once | INTRAMUSCULAR | Status: AC
  Administered 2020-06-02: 20 mg via INTRAVENOUS
  Filled 2020-06-02: qty 2

## 2020-06-02 MED ORDER — CEFDINIR 300 MG PO CAPS: 300.0000 mg | ORAL_CAPSULE | Freq: Two times a day (BID) | ORAL | Status: AC

## 2020-06-02 MED ORDER — PREDNISONE 20 MG PO TABS: ORAL_TABLET | ORAL | 0 refills | Status: DC

## 2020-06-02 MED ORDER — FUROSEMIDE 10 MG/ML IJ SOLN: 20.0000 mg | Freq: Once | INTRAMUSCULAR | Status: DC

## 2020-06-02 NOTE — Progress Notes (Signed)
PT Cancellation Note  Patient Details Name: Sandra Brown MRN: [id number] DOB: 02-13-1926   Cancelled Treatment:     PT session held today due to HgB levels too low per APTA guidelines for skilled PT services.  Will reassess next available session time.    [name] 06/02/2020, 1:06 PM

## 2020-06-02 NOTE — Chronic Care Management (AMB) (Signed)
  Chronic Care Management   Note   Name: Sandra Brown MRN: [id number] DOB: 07-15-1926    [facility] is requesting the most recent evaluation/assessment regarding Sandra Brown's functional and cognitive screenings. Per chart review, Sandra Brown is currently hospitalized and being treated for healthcare-associated pneumonia.   Follow up plan: Will follow up with the Inpatient case management team regarding current plan. Will follow up with [facility] to ensure the requested documents are routed appropriately.     Cristy Friedlander [facility] Care Management [facility] [phone]

## 2020-06-02 NOTE — TOC Progression Note (Signed)
Transition of Care [facility]) - Progression Note    Patient Details  Name: Sandra Brown MRN: [id number] Date of Birth: 11-24-1926  Transition of Care [facility]) CM/SW [place], [place] Phone Number: 06/02/2020, 12:07 PM  Clinical Narrative:     CSW spoke with Crystal at [facility] at [phone] she reports no covid test needed and patient can discharge to facility tomorrow (Saturday). Weekend staff should call [phone] when patient ready to go back to facility.   Expected Discharge Plan: [place] Barriers to Discharge: Continued Medical Work up  Expected Discharge Plan and Services Expected Discharge Plan: [place] In-house Referral: NA   Post Acute Care Choice: [place] Living arrangements for the past 2 months: Assisted Living Facility                                       Social Determinants of Health (SDOH) Interventions    Readmission Risk Interventions No flowsheet data found.

## 2020-06-02 NOTE — Care Management Important Message (Addendum)
Important Message  Patient Details  Name: Sandra Brown MRN: [id number] Date of Birth: [birth year]   Medicare Important Message Given:  Yes  Patient was eating lunch when I came by and left a copy with her.  I reviewed the Important Message from Medicare with her and I asked if she would like me to return for a signature and she replied yes.  When I returned to her room she asked me to review the form with her daughter, Lubertha South ([phone]).  Talked with daughter by phone and she is in agreement with the discharge plan for tomorrow.  I thanked her for time.  Juliann Pulse A Delman Goshorn 06/02/2020, 12:43 PM

## 2020-06-02 NOTE — Progress Notes (Signed)
[facility] PROGRESS NOTE   Sandra Brown GBT:[id number] DOB: 1926/06/17 DOA: 05/30/2020  PCP: Virginia Crews, MD  Brief History/Interval Summary: 87 y.o. female with medical history of COPD, depression, hypothyroidism, hypertension, recently treated with doxycycline/ prednisone for COPD exacerbation, presented with 3-day history of progressively worsening shortness of breath.  Admitted for management of COPD as well as pneumonia.  Consultants: None  Procedures: None  Antibiotics: Anti-infectives (From admission, onward)   Start     Dose/Rate Route Frequency Ordered Stop   06/02/20 0000  cefdinir (OMNICEF) 300 MG capsule        300 mg Oral Every 12 hours 06/02/20 0952 06/06/20 2359   06/01/20 2000  cefdinir (OMNICEF) capsule 300 mg        300 mg Oral Every 12 hours 06/01/20 1045     05/31/20 1800  vancomycin (VANCOREADY) IVPB 500 mg/100 mL  Status:  Discontinued        500 mg 100 mL/hr over 60 Minutes Intravenous Every 24 hours 05/30/20 1741 06/01/20 1045   05/31/20 0500  ceFEPIme (MAXIPIME) 2 g in sodium chloride 0.9 % 100 mL IVPB  Status:  Discontinued        2 g 200 mL/hr over 30 Minutes Intravenous Every 12 hours 05/30/20 1741 06/01/20 1045   05/30/20 1615  vancomycin (VANCOCIN) IVPB 1000 mg/200 mL premix        1,000 mg 200 mL/hr over 60 Minutes Intravenous  Once 05/30/20 1613 05/30/20 1729   05/30/20 1615  ceFEPIme (MAXIPIME) 2 g in sodium chloride 0.9 % 100 mL IVPB        2 g 200 mL/hr over 30 Minutes Intravenous  Once 05/30/20 1613 05/30/20 1756      Subjective/Interval History: Patient mentions that she feels well.  No new complaints offered.  Shortness of breath and cough is getting better.       Assessment/Plan:  Acute respiratory failure with hypoxia/COPD with acute exacerbation/community-acquired pneumonia Patient apparently was started on home oxygen a few days prior to this admission for COPD and pneumonia.  She was treated with prednisone  and doxycycline.  Presented with worsening symptoms.  X-ray shows right upper lobe infiltrates.  Patient started on broad-spectrum antibiotics with vancomycin and cefepime considering that she was on oral antibiotics recently.  Required BiPAP briefly but not in the last 24 hours.  Remains on 2 to 3 L of oxygen.  Lactic acid level was normal.  Procalcitonin 0.22.  Cultures have been negative so far.  COVID-19 PCR was negative. Patient respiratory status still has improved.  She was changed over to [facility] yesterday.  Mildly elevated troponin Patient denies any chest pain.  Likely mild demand ischemia.  Echocardiogram shows normal systolic function with no regional wall motion abnormalities.  No further work-up at this time   Hypothyroidism Continue with levothyroxine.  History of iron deficiency anemia Patient on previous labs it looks like patient has a longstanding history of anemia.  She has been followed by hematology previously.  Noted to have iron deficiency.  Patient denies being on iron supplements.   Hemoglobin has been slowly drifting down here in the hospital.  Hold her Lovenox for now.  No active bleeding has been noted.   Anemia is likely due to dilutional effect along with iron deficiency.  Monitor for symptoms of dyspnea could also be due to anemia.  She will be transfused 2 units of PRBC today.  She will be placed back on iron supplements.  History of GERD Continue PPI   DVT Prophylaxis: Lovenox Code Status: DNR Family Communication: Patient's daughter was updated yesterday. Disposition Plan: Plan to return to SNF when stable.  Possibly tomorrow.  Status is: Inpatient  Remains inpatient appropriate because:IV treatments appropriate due to intensity of illness or inability to take PO and Inpatient level of care appropriate due to severity of illness   Dispo: The patient is from: SNF              Anticipated d/c is to: SNF              Patient currently is not medically  stable to d/c.   Difficult to place patient No     Medications:  Scheduled: . arformoterol  15 mcg Nebulization BID  . budesonide (PULMICORT) nebulizer solution  0.25 mg Nebulization BID  . cefdinir  300 mg Oral Q12H  . cetirizine  10 mg Oral QPM  . feeding supplement (GLUCERNA SHAKE)  237 mL Oral TID BM  . fluticasone  2 spray Each Nare Daily  . furosemide  20 mg Intravenous Once  . guaiFENesin  600 mg Oral BID  . ipratropium-albuterol  3 mL Nebulization BID  . levothyroxine  50 mcg Oral Q0600  . methylPREDNISolone (SOLU-MEDROL) injection  40 mg Intravenous Q12H  . multivitamin with minerals  1 tablet Oral Daily  . pantoprazole sodium  20 mg Oral BID  . sodium chloride flush  3 mL Intravenous Q12H  . triamcinolone cream   Topical BID  . venlafaxine XR  75 mg Oral Q breakfast   Continuous: . sodium chloride 250 mL (05/31/20 2258)   OEU:MPNTIR chloride, acetaminophen **OR** acetaminophen, morphine injection, ondansetron **OR** ondansetron (ZOFRAN) IV, senna-docusate, sodium chloride flush   Objective:  Vital Signs  Vitals:   06/02/20 0818 06/02/20 1127 06/02/20 1219 06/02/20 1244  BP:  129/64 114/78 136/90  Pulse:  (!) 102 99 (!) 101  Resp:  (!) 26 18 18   Temp:  98.3 F (36.8 C) 98.3 F (36.8 C) 98.1 F (36.7 C)  TempSrc:  Oral Oral   SpO2: 100% 100% 100% 100%  Weight:      Height:        Intake/Output Summary (Last 24 hours) at 06/02/2020 1310 Last data filed at 06/02/2020 0559 Gross per 24 hour  Intake 240 ml  Output 1525 ml  Net -1285 ml   Filed Weights   05/30/20 1509 05/31/20 0627 06/02/20 0500  Weight: 46 kg 50.6 kg 49.8 kg    General appearance: Awake alert.  In no distress Resp: Improved aeration.  Improved effort.  Fewer crackles appreciated compared to yesterday.  No wheezing. Cardio: S1-S2 is normal regular.  No S3-S4.  No rubs murmurs or bruit GI: Abdomen is soft.  Nontender nondistended.  Bowel sounds are present normal.  No masses  organomegaly Extremities: No edema.  Full range of motion of lower extremities. Neurologic:   No focal neurological deficits.      Lab Results:  Data Reviewed: I have personally reviewed following labs and imaging studies  CBC: Recent Labs  Lab 05/30/20 1512 05/31/20 0409 06/01/20 0559 06/02/20 0355  WBC 14.9* 7.6 10.6* 9.0  NEUTROABS 13.6*  --   --   --   HGB 8.7* 7.2* 7.4* 6.9*  HCT 26.9* 23.4* 23.7* 22.1*  MCV 96.1 96.7 97.9 99.1  PLT 424* 360 394 443    Basic Metabolic Panel: Recent Labs  Lab 05/30/20 1512 05/31/20 0409 06/02/20 0355  NA 134*  137 142  K 4.1 4.0 3.8  CL 97* 104 110  CO2 24 24 24   GLUCOSE 140* 190* 139*  BUN 7* 12 22  CREATININE 0.83 0.85 0.71  CALCIUM 8.1* 7.9* 8.3*    GFR: Estimated Creatinine Clearance: 32.9 mL/min (by C-G formula based on SCr of 0.71 mg/dL).  Liver Function Tests: Recent Labs  Lab 05/30/20 1512  AST 43*  ALT 57*  ALKPHOS 81  BILITOT 0.8  PROT 6.6  ALBUMIN 2.8*      Recent Results (from the past 240 hour(s))  Culture, blood (single)     Status: None (Preliminary result)   Collection Time: 05/30/20  3:12 PM   Specimen: BLOOD  Result Value Ref Range Status   Specimen Description BLOOD LEFT ANTECUBITAL  Final   Special Requests   Final    BOTTLES DRAWN AEROBIC AND ANAEROBIC Blood Culture adequate volume   Culture   Final    NO GROWTH 3 DAYS Performed at [facility], [address], [place], [place] [address]    Report Status PENDING  Incomplete  Resp Panel by RT-PCR (Flu A&B, Covid) Nasopharyngeal Swab     Status: None   Collection Time: 05/30/20  4:31 PM   Specimen: Nasopharyngeal Swab; Nasopharyngeal(NP) swabs in vial transport medium  Result Value Ref Range Status   SARS Coronavirus 2 by RT PCR NEGATIVE NEGATIVE Final    Comment: (NOTE) SARS-CoV-2 target nucleic acids are NOT DETECTED.  The SARS-CoV-2 RNA is generally detectable in upper respiratory specimens during the acute phase of  infection. The lowest concentration of SARS-CoV-2 viral copies this assay can detect is 138 copies/mL. A negative result does not preclude SARS-Cov-2 infection and should not be used as the sole basis for treatment or other patient management decisions. A negative result may occur with  improper specimen collection/handling, submission of specimen other than nasopharyngeal swab, presence of viral mutation(s) within the areas targeted by this assay, and inadequate number of viral copies(<138 copies/mL). A negative result must be combined with clinical observations, patient history, and epidemiological information. The expected result is Negative.  Fact Sheet for Patients:  EntrepreneurPulse.com.au  Fact Sheet for Healthcare Providers:  IncredibleEmployment.be  This test is no t yet approved or cleared by the [place] FDA and  has been authorized for detection and/or diagnosis of SARS-CoV-2 by FDA under an Emergency Use Authorization (EUA). This EUA will remain  in effect (meaning this test can be used) for the duration of the COVID-19 declaration under Section 564(b)(1) of the Act, 21 U.S.C.section 360bbb-3(b)(1), unless the authorization is terminated  or revoked sooner.       Influenza A by PCR NEGATIVE NEGATIVE Final   Influenza B by PCR NEGATIVE NEGATIVE Final    Comment: (NOTE) The Xpert Xpress SARS-CoV-2/FLU/RSV plus assay is intended as an aid in the diagnosis of influenza from Nasopharyngeal swab specimens and should not be used as a sole basis for treatment. Nasal washings and aspirates are unacceptable for Xpert Xpress SARS-CoV-2/FLU/RSV testing.  Fact Sheet for Patients: EntrepreneurPulse.com.au  Fact Sheet for Healthcare Providers: IncredibleEmployment.be  This test is not yet approved or cleared by the [place] FDA and has been authorized for detection and/or diagnosis of SARS-CoV-2  by FDA under an Emergency Use Authorization (EUA). This EUA will remain in effect (meaning this test can be used) for the duration of the COVID-19 declaration under Section 564(b)(1) of the Act, 21 U.S.C. section 360bbb-3(b)(1), unless the authorization is terminated or revoked.  Performed  at [facility], [address] [place], [place] [address]   MRSA PCR Screening     Status: None   Collection Time: 05/31/20  9:57 AM   Specimen: Nasal Mucosa; Nasopharyngeal  Result Value Ref Range Status   MRSA by PCR NEGATIVE NEGATIVE Final    Comment:        The GeneXpert MRSA Assay (FDA approved for NASAL specimens only), is one component of a comprehensive MRSA colonization surveillance program. It is not intended to diagnose MRSA infection nor to guide or monitor treatment for MRSA infections. Performed at [facility], [address] [place], [place] [address]       Radiology Studies: No results found.     LOS: 3 days   Marina Desire [employer] on [email or web address]  06/02/2020, 1:10 PM

## 2020-06-02 NOTE — Discharge Instructions (Signed)
Mech Soft/GI Soft diet (w/ less meats/breads in diet if problematic for her, moistened, cut-small foods) w/ Thin liquids via Cup/less straw use d/t air swallowing; less Carbonated drinks especially at meals. Recommend general aspiration precautions. Rest Breaks during meals/oral intake to allow for Esophageal clearing and for conservation of energy. Strict REFLUX precautions strongly recommended to lessen chance for Regurgitation d/t Hiatal Hernia, Reflux.  Pills in Puree(baseline)

## 2020-06-03 DIAGNOSIS — J9621 Acute and chronic respiratory failure with hypoxia: Secondary | ICD-10-CM | POA: Diagnosis not present

## 2020-06-03 DIAGNOSIS — N182 Chronic kidney disease, stage 2 (mild): Secondary | ICD-10-CM | POA: Diagnosis not present

## 2020-06-03 DIAGNOSIS — I081 Rheumatic disorders of both mitral and tricuspid valves: Secondary | ICD-10-CM | POA: Diagnosis not present

## 2020-06-03 DIAGNOSIS — R2681 Unsteadiness on feet: Secondary | ICD-10-CM | POA: Diagnosis not present

## 2020-06-03 DIAGNOSIS — F39 Unspecified mood [affective] disorder: Secondary | ICD-10-CM | POA: Diagnosis not present

## 2020-06-03 DIAGNOSIS — J441 Chronic obstructive pulmonary disease with (acute) exacerbation: Secondary | ICD-10-CM | POA: Diagnosis not present

## 2020-06-03 DIAGNOSIS — K219 Gastro-esophageal reflux disease without esophagitis: Secondary | ICD-10-CM | POA: Diagnosis not present

## 2020-06-03 DIAGNOSIS — Z9049 Acquired absence of other specified parts of digestive tract: Secondary | ICD-10-CM | POA: Diagnosis not present

## 2020-06-03 DIAGNOSIS — R4189 Other symptoms and signs involving cognitive functions and awareness: Secondary | ICD-10-CM | POA: Diagnosis not present

## 2020-06-03 DIAGNOSIS — M2550 Pain in unspecified joint: Secondary | ICD-10-CM | POA: Diagnosis not present

## 2020-06-03 DIAGNOSIS — N1832 Chronic kidney disease, stage 3b: Secondary | ICD-10-CM | POA: Diagnosis not present

## 2020-06-03 DIAGNOSIS — R2689 Other abnormalities of gait and mobility: Secondary | ICD-10-CM | POA: Diagnosis not present

## 2020-06-03 DIAGNOSIS — J449 Chronic obstructive pulmonary disease, unspecified: Secondary | ICD-10-CM | POA: Diagnosis not present

## 2020-06-03 DIAGNOSIS — R319 Hematuria, unspecified: Secondary | ICD-10-CM | POA: Diagnosis not present

## 2020-06-03 DIAGNOSIS — M5441 Lumbago with sciatica, right side: Secondary | ICD-10-CM | POA: Diagnosis not present

## 2020-06-03 DIAGNOSIS — F424 Excoriation (skin-picking) disorder: Secondary | ICD-10-CM | POA: Diagnosis not present

## 2020-06-03 DIAGNOSIS — M2042 Other hammer toe(s) (acquired), left foot: Secondary | ICD-10-CM | POA: Diagnosis not present

## 2020-06-03 DIAGNOSIS — F3342 Major depressive disorder, recurrent, in full remission: Secondary | ICD-10-CM | POA: Diagnosis not present

## 2020-06-03 DIAGNOSIS — J189 Pneumonia, unspecified organism: Secondary | ICD-10-CM | POA: Diagnosis not present

## 2020-06-03 DIAGNOSIS — M79675 Pain in left toe(s): Secondary | ICD-10-CM | POA: Diagnosis not present

## 2020-06-03 DIAGNOSIS — Z881 Allergy status to other antibiotic agents status: Secondary | ICD-10-CM | POA: Diagnosis not present

## 2020-06-03 DIAGNOSIS — J209 Acute bronchitis, unspecified: Secondary | ICD-10-CM | POA: Diagnosis not present

## 2020-06-03 DIAGNOSIS — Z886 Allergy status to analgesic agent status: Secondary | ICD-10-CM | POA: Diagnosis not present

## 2020-06-03 DIAGNOSIS — N183 Chronic kidney disease, stage 3 unspecified: Secondary | ICD-10-CM | POA: Diagnosis not present

## 2020-06-03 DIAGNOSIS — Z96649 Presence of unspecified artificial hip joint: Secondary | ICD-10-CM | POA: Diagnosis not present

## 2020-06-03 DIAGNOSIS — R5381 Other malaise: Secondary | ICD-10-CM | POA: Diagnosis not present

## 2020-06-03 DIAGNOSIS — Z823 Family history of stroke: Secondary | ICD-10-CM | POA: Diagnosis not present

## 2020-06-03 DIAGNOSIS — K5641 Fecal impaction: Secondary | ICD-10-CM | POA: Diagnosis not present

## 2020-06-03 DIAGNOSIS — R278 Other lack of coordination: Secondary | ICD-10-CM | POA: Diagnosis not present

## 2020-06-03 DIAGNOSIS — R7401 Elevation of levels of liver transaminase levels: Secondary | ICD-10-CM | POA: Diagnosis not present

## 2020-06-03 DIAGNOSIS — E785 Hyperlipidemia, unspecified: Secondary | ICD-10-CM | POA: Diagnosis not present

## 2020-06-03 DIAGNOSIS — I10 Essential (primary) hypertension: Secondary | ICD-10-CM | POA: Diagnosis not present

## 2020-06-03 DIAGNOSIS — G3184 Mild cognitive impairment, so stated: Secondary | ICD-10-CM | POA: Diagnosis not present

## 2020-06-03 DIAGNOSIS — J9611 Chronic respiratory failure with hypoxia: Secondary | ICD-10-CM | POA: Diagnosis not present

## 2020-06-03 DIAGNOSIS — E039 Hypothyroidism, unspecified: Secondary | ICD-10-CM | POA: Diagnosis not present

## 2020-06-03 DIAGNOSIS — G4709 Other insomnia: Secondary | ICD-10-CM | POA: Diagnosis not present

## 2020-06-03 DIAGNOSIS — Z79899 Other long term (current) drug therapy: Secondary | ICD-10-CM | POA: Diagnosis not present

## 2020-06-03 DIAGNOSIS — E782 Mixed hyperlipidemia: Secondary | ICD-10-CM | POA: Diagnosis not present

## 2020-06-03 DIAGNOSIS — D509 Iron deficiency anemia, unspecified: Secondary | ICD-10-CM | POA: Diagnosis not present

## 2020-06-03 DIAGNOSIS — K5900 Constipation, unspecified: Secondary | ICD-10-CM | POA: Diagnosis not present

## 2020-06-03 DIAGNOSIS — L03116 Cellulitis of left lower limb: Secondary | ICD-10-CM | POA: Diagnosis not present

## 2020-06-03 DIAGNOSIS — R0989 Other specified symptoms and signs involving the circulatory and respiratory systems: Secondary | ICD-10-CM | POA: Diagnosis not present

## 2020-06-03 DIAGNOSIS — Z741 Need for assistance with personal care: Secondary | ICD-10-CM | POA: Diagnosis not present

## 2020-06-03 DIAGNOSIS — Z888 Allergy status to other drugs, medicaments and biological substances status: Secondary | ICD-10-CM | POA: Diagnosis not present

## 2020-06-03 DIAGNOSIS — M8580 Other specified disorders of bone density and structure, unspecified site: Secondary | ICD-10-CM | POA: Diagnosis not present

## 2020-06-03 DIAGNOSIS — B351 Tinea unguium: Secondary | ICD-10-CM | POA: Diagnosis not present

## 2020-06-03 DIAGNOSIS — R5383 Other fatigue: Secondary | ICD-10-CM | POA: Diagnosis not present

## 2020-06-03 DIAGNOSIS — R0602 Shortness of breath: Secondary | ICD-10-CM | POA: Diagnosis not present

## 2020-06-03 DIAGNOSIS — I129 Hypertensive chronic kidney disease with stage 1 through stage 4 chronic kidney disease, or unspecified chronic kidney disease: Secondary | ICD-10-CM | POA: Diagnosis not present

## 2020-06-03 DIAGNOSIS — R0981 Nasal congestion: Secondary | ICD-10-CM | POA: Diagnosis not present

## 2020-06-03 DIAGNOSIS — M79674 Pain in right toe(s): Secondary | ICD-10-CM | POA: Diagnosis not present

## 2020-06-03 DIAGNOSIS — Z880 Allergy status to penicillin: Secondary | ICD-10-CM | POA: Diagnosis not present

## 2020-06-03 DIAGNOSIS — K449 Diaphragmatic hernia without obstruction or gangrene: Secondary | ICD-10-CM | POA: Diagnosis not present

## 2020-06-03 DIAGNOSIS — Z7952 Long term (current) use of systemic steroids: Secondary | ICD-10-CM | POA: Diagnosis not present

## 2020-06-03 DIAGNOSIS — Z8249 Family history of ischemic heart disease and other diseases of the circulatory system: Secondary | ICD-10-CM | POA: Diagnosis not present

## 2020-06-03 DIAGNOSIS — D649 Anemia, unspecified: Secondary | ICD-10-CM | POA: Diagnosis not present

## 2020-06-03 DIAGNOSIS — Z882 Allergy status to sulfonamides status: Secondary | ICD-10-CM | POA: Diagnosis not present

## 2020-06-03 DIAGNOSIS — M4185 Other forms of scoliosis, thoracolumbar region: Secondary | ICD-10-CM | POA: Diagnosis not present

## 2020-06-03 DIAGNOSIS — F32A Depression, unspecified: Secondary | ICD-10-CM | POA: Diagnosis not present

## 2020-06-03 DIAGNOSIS — M6281 Muscle weakness (generalized): Secondary | ICD-10-CM | POA: Diagnosis not present

## 2020-06-03 DIAGNOSIS — J181 Lobar pneumonia, unspecified organism: Secondary | ICD-10-CM | POA: Diagnosis not present

## 2020-06-03 DIAGNOSIS — R293 Abnormal posture: Secondary | ICD-10-CM | POA: Diagnosis not present

## 2020-06-03 DIAGNOSIS — R404 Transient alteration of awareness: Secondary | ICD-10-CM | POA: Diagnosis not present

## 2020-06-03 DIAGNOSIS — M2041 Other hammer toe(s) (acquired), right foot: Secondary | ICD-10-CM | POA: Diagnosis not present

## 2020-06-03 DIAGNOSIS — E441 Mild protein-calorie malnutrition: Secondary | ICD-10-CM | POA: Diagnosis not present

## 2020-06-03 DIAGNOSIS — M810 Age-related osteoporosis without current pathological fracture: Secondary | ICD-10-CM | POA: Diagnosis not present

## 2020-06-03 DIAGNOSIS — Z7401 Bed confinement status: Secondary | ICD-10-CM | POA: Diagnosis not present

## 2020-06-03 DIAGNOSIS — Z91040 Latex allergy status: Secondary | ICD-10-CM | POA: Diagnosis not present

## 2020-06-03 DIAGNOSIS — R531 Weakness: Secondary | ICD-10-CM | POA: Diagnosis not present

## 2020-06-03 DIAGNOSIS — I509 Heart failure, unspecified: Secondary | ICD-10-CM | POA: Diagnosis not present

## 2020-06-03 LAB — CBC
HCT: 31.3 % — ABNORMAL LOW (ref 36.0–46.0)
Hemoglobin: 10.1 g/dL — ABNORMAL LOW (ref 12.0–15.0)
MCH: 30 pg (ref 26.0–34.0)
MCHC: 32.3 g/dL (ref 30.0–36.0)
MCV: 92.9 fL (ref 80.0–100.0)
Platelets: 381 10*3/uL (ref 150–400)
RBC: 3.37 MIL/uL — ABNORMAL LOW (ref 3.87–5.11)
RDW: 17.2 % — ABNORMAL HIGH (ref 11.5–15.5)
WBC: 10.9 10*3/uL — ABNORMAL HIGH (ref 4.0–10.5)
nRBC: 0 % (ref 0.0–0.2)

## 2020-06-03 LAB — BPAM RBC
Blood Product Expiration Date: 202206212359
ISSUE DATE / TIME: 202205271205
Unit Type and Rh: 6200

## 2020-06-03 LAB — TYPE AND SCREEN
ABO/RH(D): A POS
Antibody Screen: NEGATIVE
Unit division: 0

## 2020-06-03 MED ORDER — FUROSEMIDE 20 MG PO TABS: 20.0000 mg | ORAL_TABLET | Freq: Every day | ORAL | 0 refills | Status: AC

## 2020-06-03 MED ORDER — SODIUM CHLORIDE 0.9 % IV SOLN
510.0000 mg | Freq: Once | INTRAVENOUS | Status: AC
  Administered 2020-06-03: 510 mg via INTRAVENOUS
  Filled 2020-06-03: qty 17

## 2020-06-03 NOTE — Discharge Summary (Signed)
[facility]  Physician Discharge Summary   Patient ID: Sandra Brown MRN: [id number] DOB/AGE: 02-06-1926 87 y.o.  Admit date: 05/30/2020 Discharge date:   06/03/2020   PCP: Virginia Crews, MD  DISCHARGE DIAGNOSES:  Acute respiratory failure with hypoxia COPD with acute exacerbation Community-acquired pneumonia Hypothyroidism Iron deficiency anemia status post blood transfusion and iron infusion GERD  RECOMMENDATIONS FOR OUTPATIENT FOLLOW UP: 1. Ambulatory referral to Dr. Grayland Ormond with hematology for follow-up for her iron deficiency anemia.  She may need Feraheme infusions every few weeks. 2. Please check CBC and basic metabolic panel in 1 week. 3. Palliative care to see at skilled nursing facility for goals of care.   Home Health: Going to SNF Equipment/Devices: None  CODE STATUS: DNR  DISCHARGE CONDITION: fair  Diet recommendation: Dysphagia 3 diet with thin liquids  INITIAL HISTORY: 87 y.o.femalewith medical history of COPD,depression, hypothyroidism, hypertension,recently treated with doxycycline/ prednisonefor COPD exacerbation, presented with 3-day history of progressively worsening shortness of breath.  Admitted for management of COPD as well as pneumonia.    HOSPITAL COURSE:   Acute respiratory failure with hypoxia/COPD with acute exacerbation/community-acquired pneumonia Patient apparently was started on home oxygen a few days prior to this admission for COPD and pneumonia.  She was treated with prednisone and doxycycline.  Presented with worsening symptoms.  X-ray shows right upper lobe infiltrates.  Patient started on broad-spectrum antibiotics with vancomycin and cefepime considering that she was on oral antibiotics recently.  Required BiPAP briefly but not in the last 2 to 3 days.  Remains on 2 to 3 L of oxygen.  Lactic acid level was normal.  Procalcitonin 0.22.  Cultures have been negative so far.  COVID-19 PCR was  negative. Patient respiratory status still has improved.  She was changed over to [facility] which is to be continued for 4 more days.  Prednisone taper will also be ordered.  Lasix for 5 more days.  Mildly elevated troponin Patient denied any chest pain.  Likely mild demand ischemia.  Echocardiogram shows normal systolic function with no regional wall motion abnormalities.  No further work-up at this time   Hypothyroidism Continue with levothyroxine.  History of iron deficiency anemia Patient on previous labs it looks like patient has a longstanding history of anemia.  She has been followed by hematology previously.  Noted to have iron deficiency.  Patient denies being on iron supplements.   Hemoglobin has been slowly drifting down here in the hospital.    No active bleeding was noted.  Lovenox was held. Anemia is likely due to dilutional effect along with iron deficiency.  Monitor for symptoms of dyspnea could also be due to anemia.   Patient was transfused 2 units of PRBC with appropriate response.  She was also given Feraheme infusion today. She has been seen by Dr. Grayland Ormond previously.  Will send an ambulatory referral to his office.  She last received Feraheme infusion last year.  Was lost to follow-up.  This needs to be resumed on a more consistent basis going forward.    History of GERD Continue PPI  Moderate protein calorie malnutrition Nutrition Problem: Moderate Malnutrition Etiology: chronic illness (COPD, advanced age)  Signs/Symptoms: mild fat depletion,moderate fat depletion,moderate muscle depletion,severe muscle depletion  Patient is stable.  Okay for discharge to SNF today.   PERTINENT LABS:  The results of significant diagnostics from this hospitalization (including imaging, microbiology, ancillary and laboratory) are listed below for reference.    Microbiology: Recent Results (from the past 240 hour(s))  Culture, blood (single)     Status: None (Preliminary  result)   Collection Time: 05/30/20  3:12 PM   Specimen: BLOOD  Result Value Ref Range Status   Specimen Description BLOOD LEFT ANTECUBITAL  Final   Special Requests   Final    BOTTLES DRAWN AEROBIC AND ANAEROBIC Blood Culture adequate volume   Culture   Final    NO GROWTH 4 DAYS Performed at [facility], [address], [place], [place] [address]    Report Status PENDING  Incomplete  Resp Panel by RT-PCR (Flu A&B, Covid) Nasopharyngeal Swab     Status: None   Collection Time: 05/30/20  4:31 PM   Specimen: Nasopharyngeal Swab; Nasopharyngeal(NP) swabs in vial transport medium  Result Value Ref Range Status   SARS Coronavirus 2 by RT PCR NEGATIVE NEGATIVE Final    Comment: (NOTE) SARS-CoV-2 target nucleic acids are NOT DETECTED.  The SARS-CoV-2 RNA is generally detectable in upper respiratory specimens during the acute phase of infection. The lowest concentration of SARS-CoV-2 viral copies this assay can detect is 138 copies/mL. A negative result does not preclude SARS-Cov-2 infection and should not be used as the sole basis for treatment or other patient management decisions. A negative result may occur with  improper specimen collection/handling, submission of specimen other than nasopharyngeal swab, presence of viral mutation(s) within the areas targeted by this assay, and inadequate number of viral copies(<138 copies/mL). A negative result must be combined with clinical observations, patient history, and epidemiological information. The expected result is Negative.  Fact Sheet for Patients:  EntrepreneurPulse.com.au  Fact Sheet for Healthcare Providers:  IncredibleEmployment.be  This test is no t yet approved or cleared by the [place] FDA and  has been authorized for detection and/or diagnosis of SARS-CoV-2 by FDA under an Emergency Use Authorization (EUA). This EUA will remain  in effect (meaning this test can be used)  for the duration of the COVID-19 declaration under Section 564(b)(1) of the Act, 21 U.S.C.section 360bbb-3(b)(1), unless the authorization is terminated  or revoked sooner.       Influenza A by PCR NEGATIVE NEGATIVE Final   Influenza B by PCR NEGATIVE NEGATIVE Final    Comment: (NOTE) The Xpert Xpress SARS-CoV-2/FLU/RSV plus assay is intended as an aid in the diagnosis of influenza from Nasopharyngeal swab specimens and should not be used as a sole basis for treatment. Nasal washings and aspirates are unacceptable for Xpert Xpress SARS-CoV-2/FLU/RSV testing.  Fact Sheet for Patients: EntrepreneurPulse.com.au  Fact Sheet for Healthcare Providers: IncredibleEmployment.be  This test is not yet approved or cleared by the [place] FDA and has been authorized for detection and/or diagnosis of SARS-CoV-2 by FDA under an Emergency Use Authorization (EUA). This EUA will remain in effect (meaning this test can be used) for the duration of the COVID-19 declaration under Section 564(b)(1) of the Act, 21 U.S.C. section 360bbb-3(b)(1), unless the authorization is terminated or revoked.  Performed at [facility], [address] [place], [place] [address]   MRSA PCR Screening     Status: None   Collection Time: 05/31/20  9:57 AM   Specimen: Nasal Mucosa; Nasopharyngeal  Result Value Ref Range Status   MRSA by PCR NEGATIVE NEGATIVE Final    Comment:        The GeneXpert MRSA Assay (FDA approved for NASAL specimens only), is one component of a comprehensive MRSA colonization surveillance program. It is not intended to diagnose MRSA infection nor to guide or monitor treatment for  MRSA infections. Performed at [facility], [address] [place], [place] [address]      Labs:  COVID-19 Labs   Lab Results  Component Value Date   [place] NEGATIVE 05/30/2020      Basic Metabolic Panel: Recent Labs  Lab  05/30/20 1512 05/31/20 0409 06/02/20 0355  NA 134* 137 142  K 4.1 4.0 3.8  CL 97* 104 110  CO2 24 24 24   GLUCOSE 140* 190* 139*  BUN 7* 12 22  CREATININE 0.83 0.85 0.71  CALCIUM 8.1* 7.9* 8.3*   Liver Function Tests: Recent Labs  Lab 05/30/20 1512  AST 43*  ALT 57*  ALKPHOS 81  BILITOT 0.8  PROT 6.6  ALBUMIN 2.8*   CBC: Recent Labs  Lab 05/30/20 1512 05/31/20 0409 06/01/20 0559 06/02/20 0355 06/02/20 1753 06/03/20 0534  WBC 14.9* 7.6 10.6* 9.0 11.7* 10.9*  NEUTROABS 13.6*  --   --   --   --   --   HGB 8.7* 7.2* 7.4* 6.9* 10.2* 10.1*  HCT 26.9* 23.4* 23.7* 22.1* 31.0* 31.3*  MCV 96.1 96.7 97.9 99.1 93.1 92.9  PLT 424* 360 394 363 397 381   BNP: BNP (last 3 results) Recent Labs    05/30/20 1512  BNP 501.7*      IMAGING STUDIES DG Chest 2 View  Result Date: 05/15/2020 CLINICAL DATA:  87 year old female with cough and congestion EXAM: CHEST - 2 VIEW COMPARISON:  04/11/2019 FINDINGS: Cardiomediastinal silhouette unchanged with double density overlying the lower mediastinum. Low lung volumes persist with kyphotic deformity and asymmetric elevation of the right hemidiaphragm. No pneumothorax or pleural effusion. No new confluent airspace disease. Coarsened interstitial markings bilaterally. No evidence of interlobular septal thickening. Osteopenia. No acute displaced fracture. Similar configuration of the visualized vertebral bodies. IMPRESSION: Chronic lung changes, without evidence of acute cardiopulmonary disease. Hiatal hernia. Electronically Signed   By: Corrie Mckusick D.O.   On: 05/15/2020 09:45   DG Chest Port 1 View  Result Date: 05/30/2020 CLINICAL DATA:  Shortness of breath EXAM: PORTABLE CHEST 1 VIEW COMPARISON:  Portable exam 1514 hours compared to 05/12/2020 FINDINGS: Upper normal heart size. Atherosclerotic calcification aorta. Moderate-sized hiatal hernia. Infiltrates identified in RIGHT upper lobe question pneumonia. Bibasilar atelectasis and probable  tiny pleural effusions. No pneumothorax. Bones demineralized with advanced RIGHT glenohumeral degenerative changes, thoracolumbar scoliosis, and prior lumbar spinal augmentation procedures. IMPRESSION: Bibasilar atelectasis and probable tiny pleural effusions. RIGHT upper lobe infiltrate question pneumonia. Moderate-sized hiatal hernia. Electronically Signed   By: Lavonia Dana M.D.   On: 05/30/2020 15:48   ECHOCARDIOGRAM COMPLETE  Result Date: 05/31/2020    ECHOCARDIOGRAM REPORT   Patient Name:   [name] Date of Exam: 05/30/2020 Medical Rec #:  [id number]         Height:       60.0 in Accession #:    [id number]        Weight:       101.4 lb Date of Birth:  1926-03-03        BSA:          1.399 m Patient Age:    87 years          BP:           136/59 mmHg Patient Gender: F                 HR:           99 bpm. Exam Location:  [facility] Procedure:  2D Echo, Cardiac Doppler and Color Doppler Indications:     R06.00 Dyspnea  History:         Patient has no prior history of Echocardiogram examinations.                  COPD; Risk Factors:Hypertension. Hypothyroidism.  Sonographer:     Wilford Sports Rodgers-Jones Referring Phys:  [id number] Elmarie Shiley Diagnosing Phys: Serafina Royals MD IMPRESSIONS  1. Left ventricular ejection fraction, by estimation, is 60 to 65%. The left ventricle has normal function. The left ventricle has no regional wall motion abnormalities. Left ventricular diastolic parameters were normal.  2. Right ventricular systolic function is normal. The right ventricular size is normal.  3. Left atrial size was mildly dilated.  4. The mitral valve is normal in structure. Mild to moderate mitral valve regurgitation.  5. The aortic valve is normal in structure. Aortic valve regurgitation is not visualized. FINDINGS  Left Ventricle: Left ventricular ejection fraction, by estimation, is 60 to 65%. The left ventricle has normal function. The left ventricle has no regional wall motion abnormalities. The left  ventricular internal cavity size was normal in size. There is  no left ventricular hypertrophy. Left ventricular diastolic parameters were normal. Right Ventricle: The right ventricular size is normal. No increase in right ventricular wall thickness. Right ventricular systolic function is normal. Left Atrium: Left atrial size was mildly dilated. Right Atrium: Right atrial size was normal in size. Pericardium: There is no evidence of pericardial effusion. Mitral Valve: The mitral valve is normal in structure. Mild to moderate mitral valve regurgitation. Tricuspid Valve: The tricuspid valve is normal in structure. Tricuspid valve regurgitation is mild. Aortic Valve: The aortic valve is normal in structure. Aortic valve regurgitation is not visualized. Pulmonic Valve: The pulmonic valve was normal in structure. Pulmonic valve regurgitation is trivial. Aorta: The aortic root and ascending aorta are structurally normal, with no evidence of dilitation. IAS/Shunts: No atrial level shunt detected by color flow Doppler.  LEFT VENTRICLE PLAX 2D LVIDd:         3.44 cm  Diastology LVIDs:         2.16 cm  LV e' medial:    6.09 cm/s LV PW:         0.76 cm  LV E/e' medial:  21.2 LV IVS:        0.90 cm  LV e' lateral:   4.46 cm/s LVOT diam:     1.80 cm  LV E/e' lateral: 28.9 LV SV:         43 LV SV Index:   31 LVOT Area:     2.54 cm  RIGHT VENTRICLE RV Basal diam:  2.94 cm RV S prime:     14.55 cm/s TAPSE (M-mode): 1.7 cm LEFT ATRIUM             Index       RIGHT ATRIUM          Index LA diam:        4.70 cm 3.36 cm/m  RA Area:     7.52 cm LA Vol (A2C):   49.2 ml 35.18 ml/m RA Volume:   13.00 ml 9.29 ml/m LA Vol (A4C):   64.7 ml 46.26 ml/m LA Biplane Vol: 60.0 ml 42.90 ml/m  AORTIC VALVE LVOT Vmax:   105.00 cm/s LVOT Vmean:  78.100 cm/s LVOT VTI:    0.170 m  AORTA Ao Root diam: 2.90 cm MITRAL VALVE  TRICUSPID VALVE MV Area (PHT): 3.91 cm     TR Peak grad:   22.8 mmHg MV Decel Time: 194 msec     TR Vmax:         239.00 cm/s MV E velocity: 129.00 cm/s MV A velocity: 119.00 cm/s  SHUNTS MV E/A ratio:  1.08         Systemic VTI:  0.17 m                             Systemic Diam: 1.80 cm Serafina Royals MD Electronically signed by Serafina Royals MD Signature Date/Time: 05/31/2020/12:34:51 PM    Final     DISCHARGE EXAMINATION: Vitals:   06/02/20 1949 06/02/20 2052 06/03/20 0439 06/03/20 0851  BP:  (!) 152/75 (!) 172/78   Pulse: (!) 105 97 (!) 101 89  Resp: 20 17 18 16   Temp:  97.9 F (36.6 C) 98 F (36.7 C)   TempSrc:  Oral Oral   SpO2: 97% 92% 91% (!) 89%  Weight:   54.3 kg   Height:       General appearance: Awake alert.  In no distress Resp: Few scattered wheezes appreciated bilaterally.  Improved air entry compared to before. Cardio: S1-S2 is normal regular.  No S3-S4.  No rubs murmurs or bruit GI: Abdomen is soft.  Nontender nondistended.  Bowel sounds are present normal.  No masses organomegaly    DISPOSITION: SNF  Discharge Instructions    Ambulatory referral to Hematology / Oncology   Complete by: As directed    Patient previously seen by Dr. Grayland Ormond for anemia.  Lost to follow-up.  Will need to be reestablished for Feraheme infusions.   Call MD for:  difficulty breathing, headache or visual disturbances   Complete by: As directed    Call MD for:  extreme fatigue   Complete by: As directed    Call MD for:  persistant dizziness or light-headedness   Complete by: As directed    Call MD for:  persistant nausea and vomiting   Complete by: As directed    Call MD for:  severe uncontrolled pain   Complete by: As directed    Call MD for:  temperature >100.4   Complete by: As directed    Discharge instructions   Complete by: As directed    Please review instructions on the discharge summary  You were cared for by a hospitalist during your hospital stay. If you have any questions about your discharge medications or the care you received while you were in the hospital after you are  discharged, you can call the unit and asked to speak with the hospitalist on call if the hospitalist that took care of you is not available. Once you are discharged, your primary care physician will handle any further medical issues. Please note that NO REFILLS for any discharge medications will be authorized once you are discharged, as it is imperative that you return to your primary care physician (or establish a relationship with a primary care physician if you do not have one) for your aftercare needs so that they can reassess your need for medications and monitor your lab values. If you do not have a primary care physician, you can call [phone] for a physician referral.   Increase activity slowly   Complete by: As directed        Allergies as of 06/03/2020      Reactions   Sulfa  Antibiotics Rash, Itching   Other reaction(s): Diarrhea and vomiting (finding)   Erythromycin Nausea And Vomiting   Other reaction(s): Diarrhea and vomiting (finding)   Aspirin Other (See Comments), Tinitus, Nausea And Vomiting   Ringing of the ears, caused hearing loss both ears Ringing in ears   Contrast Media [iodinated Diagnostic Agents] Rash, Hives   Latex Rash, Itching   Penicillin G Rash   Penicillins Rash   Other reaction(s): UNKNOWN   Tape Rash   Other reaction(s): UNKNOWN Adhesive Other reaction(s): UNKNOWN Adhesive      Medication List    STOP taking these medications   doxycycline 100 MG tablet Commonly known as: VIBRA-TABS   simvastatin 20 MG tablet Commonly known as: ZOCOR   spironolactone 25 MG tablet Commonly known as: ALDACTONE   tamsulosin 0.4 MG Caps capsule Commonly known as: FLOMAX     TAKE these medications   acetaminophen 650 MG CR tablet Commonly known as: TYLENOL Take 650 mg by mouth every 8 (eight) hours as needed for pain.   AMBULATORY NON FORMULARY MEDICATION Medication Name: incentive spirometry Use as directed   cefdinir 300 MG capsule Commonly known as:  OMNICEF Take 1 capsule (300 mg total) by mouth every 12 (twelve) hours for 4 days.   Delsym Cough/Chest Congest DM 5-100 MG/5ML Liqd Generic drug: Dextromethorphan-guaiFENesin Take 5 mLs by mouth every 12 (twelve) hours as needed (cough).   feeding supplement (GLUCERNA SHAKE) Liqd Take 237 mLs by mouth 3 (three) times daily between meals.   fluticasone 50 MCG/ACT nasal spray Commonly known as: FLONASE USE 2 SPRAYS NASALLY DAILY What changed: See the new instructions.   furosemide 20 MG tablet Commonly known as: LASIX Take 1 tablet (20 mg total) by mouth daily for 5 days. What changed:   medication strength  how much to take   guaifenesin 400 MG Tabs tablet Commonly known as: HUMIBID E Take 400 mg by mouth in the morning and at bedtime.   hydrocortisone 1 % lotion Apply 1 application topically as needed for itching.   ipratropium-albuterol 0.5-2.5 (3) MG/3ML Soln Commonly known as: DUONEB Take 3 mLs by nebulization 3 (three) times daily.   levocetirizine 5 MG tablet Commonly known as: XYZAL Take 5 mg by mouth every evening.   loperamide 2 MG capsule Commonly known as: IMODIUM Take 2 mg by mouth as needed for diarrhea or loose stools.   Milk of Magnesia 400 MG/5ML suspension Generic drug: magnesium hydroxide Take by mouth daily as needed for mild constipation. 2 tbsp   MOUTHWASH/GARGLE MT Use as directed 5 mLs in the mouth or throat in the morning, at noon, in the evening, and at bedtime. 1 tsp by mouth as needed for sore mouth and tongue   multivitamin with minerals Tabs tablet Take 1 tablet by mouth daily.   nystatin powder Commonly known as: MYCOSTATIN/NYSTOP Apply 1 application topically 2 (two) times daily as needed.   OXYGEN Inhale 2 L into the lungs continuous as needed.   pantoprazole 20 MG tablet Commonly known as: Protonix Take 1 tablet (20 mg total) by mouth 2 (two) times daily.   predniSONE 20 MG tablet Commonly known as: DELTASONE Take 3  tablets once daily for 3 days followed by 2 tablets once daily for 3 days followed by 1 tablet once daily for 3 days and then stop   Spiriva HandiHaler 18 MCG inhalation capsule Generic drug: tiotropium PLACE 1 CAPSULE INTO INHALER AND INHALE DAILY What changed: See the new instructions.  Synthroid 50 MCG tablet Generic drug: levothyroxine TAKE 1 TABLET DAILY What changed: how much to take   triamcinolone cream 0.1 % Commonly known as: KENALOG   venlafaxine XR 75 MG 24 hr capsule Commonly known as: EFFEXOR-XR TAKE 1 CAPSULE DAILY WITH BREAKFAST What changed: See the new instructions.         Follow-up Information    Bacigalupo, Dionne Bucy, MD. Schedule an appointment as soon as possible for a visit in 1 week(s).   Specialty: Family Medicine Contact information: [address] [place] [address] [phone]        Lloyd Huger, MD Follow up.   Specialty: Oncology Why: Referral has been sent to his office. Contact information: [place] [address] [phone]               TOTAL DISCHARGE TIME: 35 minutes  [facility] Pager on [email or web address]  06/03/2020, 10:18 AM

## 2020-06-03 NOTE — TOC Transition Note (Signed)
Transition of Care [facility]) - CM/SW Discharge Note   Patient Details  Name: Sandra Brown MRN: [id number] Date of Birth: [birth year]  [facility] CM/SW Contact:  Sandra Price, RN Phone Number: 06/03/2020, 10:50 AM   Clinical Narrative:  Patient to be discharged to [facility] today. DC Summary sent via HUB. Contacted Sandra Brown at [phone] and she accepted transfer today. Contacted [facility] and will pick up around 1400 pm. Report contact given to Unit RN. Patient going to room [facility] at [facility]. Report to [phone].  Daughter was notified on 5/27 of transfer today. Updated and communicated with provide and Unit RN. Med. Nec and facesheet printed to floor for transport. Sandra Davies RN CM     Final next level of care: Skilled Nursing Facility Barriers to Discharge: Barriers Resolved   Patient Goals and CMS Choice Patient states their goals for this hospitalization and ongoing recovery are:: for pt to return to [facility]- per daughter   Choice offered to / list presented to : Adult Children  Discharge Placement              Patient chooses bed at: [facility] Patient to be transferred to facility by: [facility] Name of family member notified: Sandra Brown (Daughter)   [phone] [facility] Phone) Patient and family notified of of transfer: 06/02/20 (Per provider, daughter is aware.)  Discharge Plan and Services In-house Referral: NA   Post Acute Care Choice: [place]            DME Agency: NA       HH Arranged: NA [place] Agency: NA        Social Determinants of Health (SDOH) Interventions     Readmission Risk Interventions No flowsheet data found.

## 2020-06-04 ENCOUNTER — Encounter: Payer: Self-pay | Admitting: Family Medicine

## 2020-06-04 LAB — CULTURE, BLOOD (SINGLE)
Culture: NO GROWTH
Special Requests: ADEQUATE

## 2020-06-06 ENCOUNTER — Encounter: Payer: Self-pay | Admitting: Oncology

## 2020-06-06 DIAGNOSIS — D509 Iron deficiency anemia, unspecified: Secondary | ICD-10-CM | POA: Diagnosis not present

## 2020-06-06 DIAGNOSIS — J9611 Chronic respiratory failure with hypoxia: Secondary | ICD-10-CM | POA: Diagnosis not present

## 2020-06-06 DIAGNOSIS — J449 Chronic obstructive pulmonary disease, unspecified: Secondary | ICD-10-CM | POA: Diagnosis not present

## 2020-06-06 DIAGNOSIS — J181 Lobar pneumonia, unspecified organism: Secondary | ICD-10-CM | POA: Diagnosis not present

## 2020-06-07 ENCOUNTER — Other Ambulatory Visit: Payer: Self-pay | Admitting: *Deleted

## 2020-06-07 DIAGNOSIS — D509 Iron deficiency anemia, unspecified: Secondary | ICD-10-CM

## 2020-06-08 ENCOUNTER — Other Ambulatory Visit: Payer: Self-pay

## 2020-06-08 ENCOUNTER — Encounter: Payer: Self-pay | Admitting: Oncology

## 2020-06-08 ENCOUNTER — Inpatient Hospital Stay: Payer: Medicare Other

## 2020-06-08 ENCOUNTER — Inpatient Hospital Stay: Payer: Medicare Other | Attending: Oncology | Admitting: Oncology

## 2020-06-08 VITALS — BP 108/67 | HR 108 | Temp 98.4°F | Ht 60.0 in | Wt 102.0 lb

## 2020-06-08 DIAGNOSIS — Z886 Allergy status to analgesic agent status: Secondary | ICD-10-CM | POA: Diagnosis not present

## 2020-06-08 DIAGNOSIS — Z823 Family history of stroke: Secondary | ICD-10-CM | POA: Insufficient documentation

## 2020-06-08 DIAGNOSIS — R531 Weakness: Secondary | ICD-10-CM | POA: Diagnosis not present

## 2020-06-08 DIAGNOSIS — E039 Hypothyroidism, unspecified: Secondary | ICD-10-CM | POA: Insufficient documentation

## 2020-06-08 DIAGNOSIS — Z880 Allergy status to penicillin: Secondary | ICD-10-CM | POA: Insufficient documentation

## 2020-06-08 DIAGNOSIS — F32A Depression, unspecified: Secondary | ICD-10-CM | POA: Insufficient documentation

## 2020-06-08 DIAGNOSIS — J449 Chronic obstructive pulmonary disease, unspecified: Secondary | ICD-10-CM | POA: Insufficient documentation

## 2020-06-08 DIAGNOSIS — R5383 Other fatigue: Secondary | ICD-10-CM | POA: Diagnosis not present

## 2020-06-08 DIAGNOSIS — M8580 Other specified disorders of bone density and structure, unspecified site: Secondary | ICD-10-CM | POA: Diagnosis not present

## 2020-06-08 DIAGNOSIS — Z9049 Acquired absence of other specified parts of digestive tract: Secondary | ICD-10-CM | POA: Insufficient documentation

## 2020-06-08 DIAGNOSIS — D509 Iron deficiency anemia, unspecified: Secondary | ICD-10-CM

## 2020-06-08 DIAGNOSIS — Z8249 Family history of ischemic heart disease and other diseases of the circulatory system: Secondary | ICD-10-CM | POA: Insufficient documentation

## 2020-06-08 DIAGNOSIS — R0981 Nasal congestion: Secondary | ICD-10-CM | POA: Diagnosis not present

## 2020-06-08 DIAGNOSIS — Z881 Allergy status to other antibiotic agents status: Secondary | ICD-10-CM | POA: Insufficient documentation

## 2020-06-08 DIAGNOSIS — Z882 Allergy status to sulfonamides status: Secondary | ICD-10-CM | POA: Insufficient documentation

## 2020-06-08 DIAGNOSIS — Z79899 Other long term (current) drug therapy: Secondary | ICD-10-CM | POA: Diagnosis not present

## 2020-06-08 DIAGNOSIS — Z888 Allergy status to other drugs, medicaments and biological substances status: Secondary | ICD-10-CM | POA: Diagnosis not present

## 2020-06-08 DIAGNOSIS — K219 Gastro-esophageal reflux disease without esophagitis: Secondary | ICD-10-CM | POA: Insufficient documentation

## 2020-06-08 DIAGNOSIS — K449 Diaphragmatic hernia without obstruction or gangrene: Secondary | ICD-10-CM | POA: Insufficient documentation

## 2020-06-08 DIAGNOSIS — I081 Rheumatic disorders of both mitral and tricuspid valves: Secondary | ICD-10-CM | POA: Diagnosis not present

## 2020-06-08 DIAGNOSIS — R319 Hematuria, unspecified: Secondary | ICD-10-CM | POA: Diagnosis not present

## 2020-06-08 DIAGNOSIS — M4185 Other forms of scoliosis, thoracolumbar region: Secondary | ICD-10-CM | POA: Diagnosis not present

## 2020-06-08 DIAGNOSIS — Z7952 Long term (current) use of systemic steroids: Secondary | ICD-10-CM | POA: Insufficient documentation

## 2020-06-08 LAB — IRON AND TIBC
Iron: 57 ug/dL (ref 28–170)
Saturation Ratios: 22 % (ref 10.4–31.8)
TIBC: 255 ug/dL (ref 250–450)
UIBC: 198 ug/dL

## 2020-06-08 LAB — CBC WITH DIFFERENTIAL/PLATELET
Abs Immature Granulocytes: 0.49 K/uL — ABNORMAL HIGH (ref 0.00–0.07)
Basophils Absolute: 0 K/uL (ref 0.0–0.1)
Basophils Relative: 0 %
Eosinophils Absolute: 0 K/uL (ref 0.0–0.5)
Eosinophils Relative: 0 %
HCT: 33.2 % — ABNORMAL LOW (ref 36.0–46.0)
Hemoglobin: 10.3 g/dL — ABNORMAL LOW (ref 12.0–15.0)
Immature Granulocytes: 7 %
Lymphocytes Relative: 6 %
Lymphs Abs: 0.4 K/uL — ABNORMAL LOW (ref 0.7–4.0)
MCH: 30.1 pg (ref 26.0–34.0)
MCHC: 31 g/dL (ref 30.0–36.0)
MCV: 97.1 fL (ref 80.0–100.0)
Monocytes Absolute: 0.2 K/uL (ref 0.1–1.0)
Monocytes Relative: 3 %
Neutro Abs: 6.4 K/uL (ref 1.7–7.7)
Neutrophils Relative %: 84 %
Platelets: 247 K/uL (ref 150–400)
RBC: 3.42 MIL/uL — ABNORMAL LOW (ref 3.87–5.11)
RDW: 16.5 % — ABNORMAL HIGH (ref 11.5–15.5)
Smear Review: NORMAL
WBC: 7.5 K/uL (ref 4.0–10.5)
nRBC: 0 % (ref 0.0–0.2)

## 2020-06-08 LAB — FERRITIN: Ferritin: 853 ng/mL — ABNORMAL HIGH (ref 11–307)

## 2020-06-09 ENCOUNTER — Encounter: Payer: Self-pay | Admitting: Oncology

## 2020-06-09 ENCOUNTER — Ambulatory Visit: Payer: Medicare Other | Admitting: Podiatry

## 2020-06-09 NOTE — Progress Notes (Signed)
[place]  [phone] [phone]  ID: Sandra Brown OB: Oct 27, 1926  MR#: [id number]  KWI#:[id number]  Patient Care Team: Virginia Crews, MD as PCP - General (Family Medicine) Neldon Labella, RN as Case Manager  CHIEF COMPLAINT: Iron deficiency anemia.  INTERVAL HISTORY: Patient last evaluated in clinic in April 2021.  She is referred back for further monitoring and treatment of her iron deficiency anemia.  She continues to have chronic weakness and fatigue.  She has no neurologic complaints.  She denies any recent fevers or illnesses.  She has a good appetite and denies weight loss.  She has no chest pain, shortness of breath, cough, or hemoptysis.  He denies any nausea, vomiting, constipation, or diarrhea.  She has no melena or hematochezia.  She has no urinary complaints.  Patient offers no further specific complaints today.  REVIEW OF SYSTEMS:   Review of Systems  Constitutional: Positive for malaise/fatigue. Negative for fever and weight loss.  Respiratory: Negative.  Negative for cough, hemoptysis and shortness of breath.   Cardiovascular: Negative.  Negative for chest pain and leg swelling.  Gastrointestinal: Negative.  Negative for abdominal pain, blood in stool and melena.  Genitourinary: Negative.  Negative for hematuria.  Musculoskeletal: Negative.  Negative for back pain.  Skin: Negative.  Negative for rash.  Neurological: Positive for weakness. Negative for focal weakness and headaches.  Psychiatric/Behavioral: Negative.  The patient is not nervous/anxious.     As per HPI. Otherwise, a complete review of systems is negative.  PAST MEDICAL HISTORY: Past Medical History:  Diagnosis Date  . Cataract   . COPD (chronic obstructive pulmonary disease) ([place])   . Depression   . Difficulty swallowing   . Frequent headaches   . Hearing loss   . Hypertension   . Hypothyroidism   . Reflux     PAST SURGICAL HISTORY: Past  Surgical History:  Procedure Laterality Date  . ABDOMINAL HYSTERECTOMY    . APPENDECTOMY    . LUMBAR LAMINECTOMY    . PARATHYROIDECTOMY    . TOTAL HIP ARTHROPLASTY     x 4    FAMILY HISTORY: Family History  Problem Relation Age of Onset  . Stroke Mother   . Hypertension Mother   . Heart disease Father   . Hypertension Father     ADVANCED DIRECTIVES (Y/N):  N  HEALTH MAINTENANCE: Social History   Tobacco Use  . Smoking status: Never Smoker  . Smokeless tobacco: Never Used  . Tobacco comment: quit 1954  Vaping Use  . Vaping Use: Never used  Substance Use Topics  . Alcohol use: No  . Drug use: No     Colonoscopy:  PAP:  Bone density:  Lipid panel:  Allergies  Allergen Reactions  . Sulfa Antibiotics Rash and Itching    Other reaction(s): Diarrhea and vomiting (finding)  . Erythromycin Nausea And Vomiting    Other reaction(s): Diarrhea and vomiting (finding)  . Aspirin Other (See Comments), Tinitus and Nausea And Vomiting    Ringing of the ears, caused hearing loss both ears Ringing in ears  . Contrast Media [Iodinated Diagnostic Agents] Rash and Hives  . Latex Rash and Itching  . Penicillin G Rash  . Penicillins Rash    Other reaction(s): UNKNOWN  . Tape Rash    Other reaction(s): UNKNOWN Adhesive Other reaction(s): UNKNOWN Adhesive    Current Outpatient Medications  Medication Sig Dispense Refill  . acetaminophen (TYLENOL) 650 MG CR tablet Take 650 mg by mouth  every 8 (eight) hours as needed for pain.    Marland Kitchen AMBULATORY NON FORMULARY MEDICATION Medication Name: incentive spirometry Use as directed 1 each 0  . Dextromethorphan-guaiFENesin (DELSYM COUGH/CHEST CONGEST DM) 5-100 MG/5ML LIQD Take 5 mLs by mouth every 12 (twelve) hours as needed (cough).    . feeding supplement, GLUCERNA SHAKE, (GLUCERNA SHAKE) LIQD Take 237 mLs by mouth 3 (three) times daily between meals.  0  . fluticasone (FLONASE) 50 MCG/ACT nasal spray USE 2 SPRAYS NASALLY DAILY (Patient  taking differently: Place 2 sprays into both nostrils daily. USE 2 SPRAYS NASALLY DAILY) 48 g 1  . furosemide (LASIX) 20 MG tablet Take 1 tablet (20 mg total) by mouth daily for 5 days. 5 tablet 0  . guaifenesin (HUMIBID E) 400 MG TABS tablet Take 400 mg by mouth in the morning and at bedtime.    . hydrocortisone 1 % lotion Apply 1 application topically as needed for itching.    Marland Kitchen ipratropium-albuterol (DUONEB) 0.5-2.5 (3) MG/3ML SOLN Take 3 mLs by nebulization 3 (three) times daily.    Marland Kitchen levocetirizine (XYZAL) 5 MG tablet Take 5 mg by mouth every evening.    . loperamide (IMODIUM) 2 MG capsule Take 2 mg by mouth as needed for diarrhea or loose stools.    . magnesium hydroxide (MILK OF MAGNESIA) 400 MG/5ML suspension Take by mouth daily as needed for mild constipation. 2 tbsp    . Mouthwashes (MOUTHWASH/GARGLE MT) Use as directed 5 mLs in the mouth or throat in the morning, at noon, in the evening, and at bedtime. 1 tsp by mouth as needed for sore mouth and tongue    . Multiple Vitamin (MULTIVITAMIN WITH MINERALS) TABS tablet Take 1 tablet by mouth daily.    Marland Kitchen nystatin (MYCOSTATIN/NYSTOP) powder Apply 1 application topically 2 (two) times daily as needed.    . OXYGEN Inhale 2 L into the lungs continuous as needed.    . pantoprazole (PROTONIX) 20 MG tablet Take 1 tablet (20 mg total) by mouth 2 (two) times daily. 180 tablet 0  . predniSONE (DELTASONE) 20 MG tablet Take 3 tablets once daily for 3 days followed by 2 tablets once daily for 3 days followed by 1 tablet once daily for 3 days and then stop 18 tablet 0  . SPIRIVA HANDIHALER 18 MCG inhalation capsule PLACE 1 CAPSULE INTO INHALER AND INHALE DAILY (Patient taking differently: Place 18 mcg into inhaler and inhale at bedtime.) 90 capsule 0  . SYNTHROID 50 MCG tablet TAKE 1 TABLET DAILY (Patient taking differently: Take 50 mcg by mouth daily.) 90 tablet 1  . triamcinolone (KENALOG) 0.1 %     . venlafaxine XR (EFFEXOR-XR) 75 MG 24 hr capsule TAKE 1  CAPSULE DAILY WITH BREAKFAST (Patient taking differently: Take 75 mg by mouth daily with breakfast.) 90 capsule 3   No current facility-administered medications for this visit.    OBJECTIVE: Vitals:   06/08/20 1449  BP: 108/67  Pulse: (!) 108  Temp: 98.4 F (36.9 C)  SpO2: 97%     Body mass index is 19.92 kg/m.    ECOG FS:2 - Symptomatic, <50% confined to bed  General: Well-developed, well-nourished, no acute distress.  Sitting in a wheelchair. Eyes: Pink conjunctiva, anicteric sclera. HEENT: Normocephalic, moist mucous membranes. Lungs: No audible wheezing or coughing. Heart: Regular rate and rhythm. Abdomen: Soft, nontender, no obvious distention. Musculoskeletal: No edema, cyanosis, or clubbing. Neuro: Alert, answering all questions appropriately. Cranial nerves grossly intact. Skin: No rashes or petechiae noted.  Psych: Normal affect.  LAB RESULTS:  Lab Results  Component Value Date   NA 142 06/02/2020   K 3.8 06/02/2020   CL 110 06/02/2020   CO2 24 06/02/2020   GLUCOSE 139 (H) 06/02/2020   BUN 22 06/02/2020   CREATININE 0.71 06/02/2020   CALCIUM 8.3 (L) 06/02/2020   PROT 6.6 05/30/2020   ALBUMIN 2.8 (L) 05/30/2020   AST 43 (H) 05/30/2020   ALT 57 (H) 05/30/2020   ALKPHOS 81 05/30/2020   BILITOT 0.8 05/30/2020   GFRNONAA >60 06/02/2020   GFRAA 41 11/23/2019    Lab Results  Component Value Date   WBC 7.5 06/08/2020   NEUTROABS 6.4 06/08/2020   HGB 10.3 (L) 06/08/2020   HCT 33.2 (L) 06/08/2020   MCV 97.1 06/08/2020   PLT 247 06/08/2020    Lab Results  Component Value Date   IRON 57 06/08/2020   TIBC 255 06/08/2020   IRONPCTSAT 22 06/08/2020   Lab Results  Component Value Date   FERRITIN 853 (H) 06/08/2020     STUDIES: DG Chest 2 View  Result Date: 05/15/2020 CLINICAL DATA:  87 year old female with cough and congestion EXAM: CHEST - 2 VIEW COMPARISON:  04/11/2019 FINDINGS: Cardiomediastinal silhouette unchanged with double density overlying the  lower mediastinum. Low lung volumes persist with kyphotic deformity and asymmetric elevation of the right hemidiaphragm. No pneumothorax or pleural effusion. No new confluent airspace disease. Coarsened interstitial markings bilaterally. No evidence of interlobular septal thickening. Osteopenia. No acute displaced fracture. Similar configuration of the visualized vertebral bodies. IMPRESSION: Chronic lung changes, without evidence of acute cardiopulmonary disease. Hiatal hernia. Electronically Signed   By: Corrie Mckusick D.O.   On: 05/15/2020 09:45   DG Chest Port 1 View  Result Date: 05/30/2020 CLINICAL DATA:  Shortness of breath EXAM: PORTABLE CHEST 1 VIEW COMPARISON:  Portable exam 1514 hours compared to 05/12/2020 FINDINGS: Upper normal heart size. Atherosclerotic calcification aorta. Moderate-sized hiatal hernia. Infiltrates identified in RIGHT upper lobe question pneumonia. Bibasilar atelectasis and probable tiny pleural effusions. No pneumothorax. Bones demineralized with advanced RIGHT glenohumeral degenerative changes, thoracolumbar scoliosis, and prior lumbar spinal augmentation procedures. IMPRESSION: Bibasilar atelectasis and probable tiny pleural effusions. RIGHT upper lobe infiltrate question pneumonia. Moderate-sized hiatal hernia. Electronically Signed   By: Lavonia Dana M.D.   On: 05/30/2020 15:48   ECHOCARDIOGRAM COMPLETE  Result Date: 05/31/2020    ECHOCARDIOGRAM REPORT   Patient Name:   Sandra Brown Date of Exam: 05/30/2020 Medical Rec #:  [id number]         Height:       60.0 in Accession #:    [id number]        Weight:       101.4 lb Date of Birth:  [birth year]        BSA:          1.399 m Patient Age:    61 years          BP:           136/59 mmHg Patient Gender: F                 HR:           99 bpm. Exam Location:  [facility] Procedure: 2D Echo, Cardiac Doppler and Color Doppler Indications:     R06.00 Dyspnea  History:         Patient has no prior history of Echocardiogram examinations.  COPD; Risk Factors:Hypertension. Hypothyroidism.  Sonographer:     Wilford Sports Rodgers-Jones Referring Phys:  1941 Elmarie Shiley Diagnosing Phys: Serafina Royals MD IMPRESSIONS  1. Left ventricular ejection fraction, by estimation, is 60 to 65%. The left ventricle has normal function. The left ventricle has no regional wall motion abnormalities. Left ventricular diastolic parameters were normal.  2. Right ventricular systolic function is normal. The right ventricular size is normal.  3. Left atrial size was mildly dilated.  4. The mitral valve is normal in structure. Mild to moderate mitral valve regurgitation.  5. The aortic valve is normal in structure. Aortic valve regurgitation is not visualized. FINDINGS  Left Ventricle: Left ventricular ejection fraction, by estimation, is 60 to 65%. The left ventricle has normal function. The left ventricle has no regional wall motion abnormalities. The left ventricular internal cavity size was normal in size. There is  no left ventricular hypertrophy. Left ventricular diastolic parameters were normal. Right Ventricle: The right ventricular size is normal. No increase in right ventricular wall thickness. Right ventricular systolic function is normal. Left Atrium: Left atrial size was mildly dilated. Right Atrium: Right atrial size was normal in size. Pericardium: There is no evidence of pericardial effusion. Mitral Valve: The mitral valve is normal in structure. Mild to moderate mitral valve regurgitation. Tricuspid Valve: The tricuspid valve is normal in structure. Tricuspid valve regurgitation is mild. Aortic Valve: The aortic valve is normal in structure. Aortic valve regurgitation is not visualized. Pulmonic Valve: The pulmonic valve was normal in structure. Pulmonic valve regurgitation is trivial. Aorta: The aortic root and ascending aorta are structurally normal, with no evidence of dilitation. IAS/Shunts: No atrial level shunt detected by color flow  Doppler.  LEFT VENTRICLE PLAX 2D LVIDd:         3.44 cm  Diastology LVIDs:         2.16 cm  LV e' medial:    6.09 cm/s LV PW:         0.76 cm  LV E/e' medial:  21.2 LV IVS:        0.90 cm  LV e' lateral:   4.46 cm/s LVOT diam:     1.80 cm  LV E/e' lateral: 28.9 LV SV:         43 LV SV Index:   31 LVOT Area:     2.54 cm  RIGHT VENTRICLE RV Basal diam:  2.94 cm RV S prime:     14.55 cm/s TAPSE (M-mode): 1.7 cm LEFT ATRIUM             Index       RIGHT ATRIUM          Index LA diam:        4.70 cm 3.36 cm/m  RA Area:     7.52 cm LA Vol (A2C):   49.2 ml 35.18 ml/m RA Volume:   13.00 ml 9.29 ml/m LA Vol (A4C):   64.7 ml 46.26 ml/m LA Biplane Vol: 60.0 ml 42.90 ml/m  AORTIC VALVE LVOT Vmax:   105.00 cm/s LVOT Vmean:  78.100 cm/s LVOT VTI:    0.170 m  AORTA Ao Root diam: 2.90 cm MITRAL VALVE                TRICUSPID VALVE MV Area (PHT): 3.91 cm     TR Peak grad:   22.8 mmHg MV Decel Time: 194 msec     TR Vmax:        239.00 cm/s MV E  velocity: 129.00 cm/s MV A velocity: 119.00 cm/s  SHUNTS MV E/A ratio:  1.08         Systemic VTI:  0.17 m                             Systemic Diam: 1.80 cm Serafina Royals MD Electronically signed by Serafina Royals MD Signature Date/Time: 05/31/2020/12:34:51 PM    Final     ASSESSMENT: Iron deficiency anemia.  PLAN:    1. Iron deficiency anemia: Patient's hemoglobin remains decreased, but improved since her hospital admission at 10.3.  Iron stores are now within normal limits.  Ferritin is likely elevated secondary to acute phase reactant.  Previously, all of her other laboratory work is either negative or within normal limits.  [employer] myeloid panel revealed a variant of unknown clinical significance.  She does not require additional IV Feraheme today.  Her most recent treatment occurred on Jun 03, 2020 while she was an inpatient.  Return to clinic in 3 months with repeat laboratory work, further evaluation, and consideration of treatment if needed. 2.  Hematuria: Patient  does not complain of this today.  She reports this is chronic and unchanged for decades and no etiology has ever been determined.  I spent a total of 30 minutes reviewing chart data, face-to-face evaluation with the patient, counseling and coordination of care as detailed above.   Patient expressed understanding and was in agreement with this plan. She also understands that She can call clinic at any time with any questions, concerns, or complaints.    Lloyd Huger, MD   06/09/2020 6:21 AM

## 2020-06-19 DIAGNOSIS — F39 Unspecified mood [affective] disorder: Secondary | ICD-10-CM | POA: Diagnosis not present

## 2020-06-19 DIAGNOSIS — J449 Chronic obstructive pulmonary disease, unspecified: Secondary | ICD-10-CM | POA: Diagnosis not present

## 2020-06-19 DIAGNOSIS — K219 Gastro-esophageal reflux disease without esophagitis: Secondary | ICD-10-CM | POA: Diagnosis not present

## 2020-06-19 DIAGNOSIS — E441 Mild protein-calorie malnutrition: Secondary | ICD-10-CM | POA: Diagnosis not present

## 2020-06-19 DIAGNOSIS — J9611 Chronic respiratory failure with hypoxia: Secondary | ICD-10-CM | POA: Diagnosis not present

## 2020-06-20 ENCOUNTER — Other Ambulatory Visit: Payer: Self-pay

## 2020-06-20 ENCOUNTER — Ambulatory Visit (INDEPENDENT_AMBULATORY_CARE_PROVIDER_SITE_OTHER): Payer: Medicare Other | Admitting: Family Medicine

## 2020-06-20 ENCOUNTER — Encounter: Payer: Self-pay | Admitting: Family Medicine

## 2020-06-20 VITALS — BP 169/82 | HR 91 | Temp 98.1°F | Resp 18 | Ht 60.0 in | Wt 109.0 lb

## 2020-06-20 DIAGNOSIS — J189 Pneumonia, unspecified organism: Secondary | ICD-10-CM | POA: Diagnosis not present

## 2020-06-20 DIAGNOSIS — F3342 Major depressive disorder, recurrent, in full remission: Secondary | ICD-10-CM

## 2020-06-20 DIAGNOSIS — E039 Hypothyroidism, unspecified: Secondary | ICD-10-CM | POA: Diagnosis not present

## 2020-06-20 DIAGNOSIS — J9621 Acute and chronic respiratory failure with hypoxia: Secondary | ICD-10-CM

## 2020-06-20 DIAGNOSIS — E782 Mixed hyperlipidemia: Secondary | ICD-10-CM

## 2020-06-20 DIAGNOSIS — D509 Iron deficiency anemia, unspecified: Secondary | ICD-10-CM

## 2020-06-20 DIAGNOSIS — J449 Chronic obstructive pulmonary disease, unspecified: Secondary | ICD-10-CM | POA: Diagnosis not present

## 2020-06-20 NOTE — Progress Notes (Signed)
I,April Miller,acting as a scribe for Wilhemena Durie, MD.,have documented all relevant documentation on the behalf of Wilhemena Durie, MD,as directed by  Wilhemena Durie, MD while in the presence of Wilhemena Durie, MD.   Established patient visit   [name]   DOB: 1926-04-24   87 y.o. Female  MRN: [id number] Visit Date: 06/20/2020  Today's healthcare provider: Wilhemena Durie, MD   Chief Complaint  Patient presents with   Hospitalization Follow-up   Subjective    HPI  Patient comes in today for follow-up after hospitalization.  She is slowly but steadily improving.  She seems to be pleased with her prolonged progress.  Follow-up with podiatry and psychiatry in the next couple of weeks.  Daughter brings her in. She is breathing well and her energy level is slowly improving. At the time of her hospitalization /discharge they stopped her simvastatin and spironolactone Flomax and doxycycline. Follow up Hospitalization  Patient was admitted to [facility] on 05/30/20 and discharged on 06/03/20. She was treated with doxycycline/ prednisone for COPD exacerbation, presented with 3-day history of progressively worsening shortness of breath.  Admitted for management of COPD as well as pneumonia.   She reports good compliance with treatment. She reports this condition is improved.  Patient states she feels much improved since discharge. Patient completed antibiotic and prednisone.      Medications: Outpatient Medications Prior to Visit  Medication Sig   acetaminophen (TYLENOL) 650 MG CR tablet Take 650 mg by mouth every 8 (eight) hours as needed for pain.   AMBULATORY NON FORMULARY MEDICATION Medication Name: incentive spirometry Use as directed   Dextromethorphan-guaiFENesin (DELSYM COUGH/CHEST CONGEST DM) 5-100 MG/5ML LIQD Take 5 mLs by mouth every 12 (twelve) hours as needed (cough).   feeding supplement, GLUCERNA SHAKE, (GLUCERNA SHAKE) LIQD Take 237  mLs by mouth 3 (three) times daily between meals.   fluticasone (FLONASE) 50 MCG/ACT nasal spray USE 2 SPRAYS NASALLY DAILY (Patient taking differently: Place 2 sprays into both nostrils daily. USE 2 SPRAYS NASALLY DAILY)   furosemide (LASIX) 20 MG tablet Take 1 tablet (20 mg total) by mouth daily for 5 days.   guaifenesin (HUMIBID E) 400 MG TABS tablet Take 400 mg by mouth in the morning and at bedtime.   hydrocortisone 1 % lotion Apply 1 application topically as needed for itching.   ipratropium-albuterol (DUONEB) 0.5-2.5 (3) MG/3ML SOLN Take 3 mLs by nebulization 3 (three) times daily.   levocetirizine (XYZAL) 5 MG tablet Take 5 mg by mouth every evening.   loperamide (IMODIUM) 2 MG capsule Take 2 mg by mouth as needed for diarrhea or loose stools.   magnesium hydroxide (MILK OF MAGNESIA) 400 MG/5ML suspension Take by mouth daily as needed for mild constipation. 2 tbsp   Mouthwashes (MOUTHWASH/GARGLE MT) Use as directed 5 mLs in the mouth or throat in the morning, at noon, in the evening, and at bedtime. 1 tsp by mouth as needed for sore mouth and tongue   Multiple Vitamin (MULTIVITAMIN WITH MINERALS) TABS tablet Take 1 tablet by mouth daily.   nystatin (MYCOSTATIN/NYSTOP) powder Apply 1 application topically 2 (two) times daily as needed.   OXYGEN Inhale 2 L into the lungs continuous as needed.   pantoprazole (PROTONIX) 20 MG tablet Take 1 tablet (20 mg total) by mouth 2 (two) times daily.   predniSONE (DELTASONE) 20 MG tablet Take 3 tablets once daily for 3 days followed by 2 tablets once daily for 3  days followed by 1 tablet once daily for 3 days and then stop   SPIRIVA HANDIHALER 18 MCG inhalation capsule PLACE 1 CAPSULE INTO INHALER AND INHALE DAILY (Patient taking differently: Place 18 mcg into inhaler and inhale at bedtime.)   SYNTHROID 50 MCG tablet TAKE 1 TABLET DAILY (Patient taking differently: Take 50 mcg by mouth daily.)   triamcinolone (KENALOG) 0.1 %    venlafaxine XR (EFFEXOR-XR)  75 MG 24 hr capsule TAKE 1 CAPSULE DAILY WITH BREAKFAST (Patient taking differently: Take 75 mg by mouth daily with breakfast.)   No facility-administered medications prior to visit.    Review of Systems  Constitutional:  Negative for appetite change, chills, fatigue and fever.  Respiratory:  Negative for chest tightness and shortness of breath.   Cardiovascular:  Negative for chest pain and palpitations.  Gastrointestinal:  Negative for abdominal pain, nausea and vomiting.  Neurological:  Negative for dizziness and weakness.       Objective    BP (!) 169/82 (BP Location: Left Arm, Patient Position: Sitting, Cuff Size: Normal)   Pulse 91   Temp 98.1 F (36.7 C) (Oral)   Resp 18   Ht 5' (1.524 m)   Wt 109 lb (49.4 kg)   SpO2 99%   BMI 21.29 kg/m  BP Readings from Last 3 Encounters:  06/20/20 (!) 169/82  06/08/20 108/67  06/03/20 (!) 153/82   Wt Readings from Last 3 Encounters: 06/20/20 109 lb (49.4 kg) 06/08/20 102 lb (46.3 kg) 06/03/20 119 lb 9.6 oz (54.3 kg)       Physical Exam Vitals reviewed.  Constitutional:      General: She is not in acute distress.    Appearance: She is well-developed.  HENT:     Head: Normocephalic and atraumatic.     Right Ear: Hearing and tympanic membrane normal.     Left Ear: Hearing and tympanic membrane normal.     Nose: Nose normal.  Eyes:     General: Lids are normal. No scleral icterus.       Right eye: No discharge.        Left eye: No discharge.     Conjunctiva/sclera: Conjunctivae normal.  Cardiovascular:     Rate and Rhythm: Normal rate.     Heart sounds: Normal heart sounds.  Pulmonary:     Effort: Pulmonary effort is normal.     Breath sounds: No wheezing.     Comments: Lungs clear today. Abdominal:     General: Bowel sounds are normal.  Musculoskeletal:     Cervical back: Neck supple.  Skin:    General: Skin is warm.     Findings: No lesion or rash.  Neurological:     Mental Status: She is alert and  oriented to person, place, and time.  Psychiatric:        Mood and Affect: Mood normal.        Speech: Speech normal.        Behavior: Behavior normal.        Thought Content: Thought content normal.      No results found for any visits on 06/20/20.  Assessment & Plan     1. Community acquired pneumonia of right upper lobe of lung Medically markedly improved.  Too early for follow-up chest x-ray yet.  We will follow-up on next visit.  2. Chronic obstructive pulmonary disease, unspecified COPD type ([place]) Niccoli stable  3. Acute on chronic respiratory failure with hypoxia (HCC) Solved.  4. Hypothyroidism, unspecified  type   5. MDD (major depressive disorder), recurrent, in full remission [facility] Psychiatry next week  6. Iron deficiency anemia, unspecified iron deficiency anemia type Had infusions.  Follow-up blood work.  7. Mixed hyperlipidemia She is now off of Zocor which I think is very reasonable in this 87 year old.   No follow-ups on file.      I, Wilhemena Durie, MD, have reviewed all documentation for this visit. The documentation on 06/23/20 for the exam, diagnosis, procedures, and orders are all accurate and complete.    Kinston Magnan Cranford Mon, MD  [facility] [phone] (phone) [phone] (fax)  [place]

## 2020-06-23 ENCOUNTER — Telehealth: Payer: Self-pay

## 2020-06-23 NOTE — Telephone Encounter (Signed)
Copied from [place] [id number]. Topic: General - Other >> Jun 23, 2020  9:12 AM Leward Quan A wrote: Reason for CRM: Patient daughter Len Blalock called in to inform Dr B that the patient  will need an appointment before 07/19/20 to see her due to her been at [facility] facility and patient really want Dr B to be her provider. So in order for her to have an outside provider she need this appointment to be seen before date specified. Please call [name] at   Ph# [phone]

## 2020-06-26 DIAGNOSIS — L03116 Cellulitis of left lower limb: Secondary | ICD-10-CM | POA: Diagnosis not present

## 2020-06-26 NOTE — Telephone Encounter (Signed)
I'm not sure I understand this message, but it sounds like patient wants to be seen for an OV (need reason?) before mid July.  We can offer what is available for me or other providers.

## 2020-06-27 NOTE — Telephone Encounter (Signed)
Spoke with patients daughter on phone who states that patient is in skilled nursing and needs evaluation every 30 days while in skilled nursing, appointment has already been made for 07/18/20. KW

## 2020-06-28 DIAGNOSIS — J209 Acute bronchitis, unspecified: Secondary | ICD-10-CM | POA: Diagnosis not present

## 2020-06-28 DIAGNOSIS — R293 Abnormal posture: Secondary | ICD-10-CM | POA: Diagnosis not present

## 2020-06-28 DIAGNOSIS — M6281 Muscle weakness (generalized): Secondary | ICD-10-CM | POA: Diagnosis not present

## 2020-06-28 DIAGNOSIS — R2689 Other abnormalities of gait and mobility: Secondary | ICD-10-CM | POA: Diagnosis not present

## 2020-06-28 DIAGNOSIS — R4189 Other symptoms and signs involving cognitive functions and awareness: Secondary | ICD-10-CM | POA: Diagnosis not present

## 2020-06-28 DIAGNOSIS — R278 Other lack of coordination: Secondary | ICD-10-CM | POA: Diagnosis not present

## 2020-06-28 DIAGNOSIS — Z741 Need for assistance with personal care: Secondary | ICD-10-CM | POA: Diagnosis not present

## 2020-06-28 DIAGNOSIS — J441 Chronic obstructive pulmonary disease with (acute) exacerbation: Secondary | ICD-10-CM | POA: Diagnosis not present

## 2020-06-30 ENCOUNTER — Ambulatory Visit: Payer: Medicare Other | Admitting: Podiatry

## 2020-06-30 DIAGNOSIS — J209 Acute bronchitis, unspecified: Secondary | ICD-10-CM | POA: Diagnosis not present

## 2020-06-30 DIAGNOSIS — R2689 Other abnormalities of gait and mobility: Secondary | ICD-10-CM | POA: Diagnosis not present

## 2020-06-30 DIAGNOSIS — M6281 Muscle weakness (generalized): Secondary | ICD-10-CM | POA: Diagnosis not present

## 2020-06-30 DIAGNOSIS — R4189 Other symptoms and signs involving cognitive functions and awareness: Secondary | ICD-10-CM | POA: Diagnosis not present

## 2020-06-30 DIAGNOSIS — R293 Abnormal posture: Secondary | ICD-10-CM | POA: Diagnosis not present

## 2020-06-30 DIAGNOSIS — J441 Chronic obstructive pulmonary disease with (acute) exacerbation: Secondary | ICD-10-CM | POA: Diagnosis not present

## 2020-07-02 DIAGNOSIS — R4189 Other symptoms and signs involving cognitive functions and awareness: Secondary | ICD-10-CM | POA: Diagnosis not present

## 2020-07-02 DIAGNOSIS — R2689 Other abnormalities of gait and mobility: Secondary | ICD-10-CM | POA: Diagnosis not present

## 2020-07-02 DIAGNOSIS — J441 Chronic obstructive pulmonary disease with (acute) exacerbation: Secondary | ICD-10-CM | POA: Diagnosis not present

## 2020-07-02 DIAGNOSIS — J209 Acute bronchitis, unspecified: Secondary | ICD-10-CM | POA: Diagnosis not present

## 2020-07-02 DIAGNOSIS — R293 Abnormal posture: Secondary | ICD-10-CM | POA: Diagnosis not present

## 2020-07-02 DIAGNOSIS — M6281 Muscle weakness (generalized): Secondary | ICD-10-CM | POA: Diagnosis not present

## 2020-07-03 ENCOUNTER — Ambulatory Visit: Payer: Medicare Other | Admitting: Family Medicine

## 2020-07-03 DIAGNOSIS — J441 Chronic obstructive pulmonary disease with (acute) exacerbation: Secondary | ICD-10-CM | POA: Diagnosis not present

## 2020-07-03 DIAGNOSIS — R2689 Other abnormalities of gait and mobility: Secondary | ICD-10-CM | POA: Diagnosis not present

## 2020-07-03 DIAGNOSIS — M6281 Muscle weakness (generalized): Secondary | ICD-10-CM | POA: Diagnosis not present

## 2020-07-03 DIAGNOSIS — J209 Acute bronchitis, unspecified: Secondary | ICD-10-CM | POA: Diagnosis not present

## 2020-07-03 DIAGNOSIS — R4189 Other symptoms and signs involving cognitive functions and awareness: Secondary | ICD-10-CM | POA: Diagnosis not present

## 2020-07-03 DIAGNOSIS — R293 Abnormal posture: Secondary | ICD-10-CM | POA: Diagnosis not present

## 2020-07-04 ENCOUNTER — Other Ambulatory Visit: Payer: Self-pay

## 2020-07-04 ENCOUNTER — Ambulatory Visit (INDEPENDENT_AMBULATORY_CARE_PROVIDER_SITE_OTHER): Payer: Medicare Other | Admitting: Podiatry

## 2020-07-04 DIAGNOSIS — M2041 Other hammer toe(s) (acquired), right foot: Secondary | ICD-10-CM | POA: Diagnosis not present

## 2020-07-04 DIAGNOSIS — B351 Tinea unguium: Secondary | ICD-10-CM

## 2020-07-04 DIAGNOSIS — M79675 Pain in left toe(s): Secondary | ICD-10-CM

## 2020-07-04 DIAGNOSIS — M2042 Other hammer toe(s) (acquired), left foot: Secondary | ICD-10-CM | POA: Diagnosis not present

## 2020-07-04 DIAGNOSIS — M79674 Pain in right toe(s): Secondary | ICD-10-CM | POA: Diagnosis not present

## 2020-07-04 NOTE — Progress Notes (Signed)
   SUBJECTIVE Patient presents to office today complaining of elongated, thickened nails that cause pain while ambulating in shoes. She is unable to trim her own nails. Patient is also concerned for hammertoe deformities that have developed over the past year. She has not done anything for treatment. Patient is here for further evaluation and treatment.  Past Medical History:  Diagnosis Date   Cataract    COPD (chronic obstructive pulmonary disease) ([place])    Depression    Difficulty swallowing    Frequent headaches    Hearing loss    Hypertension    Hypothyroidism    Reflux     OBJECTIVE General Patient is awake, alert, and oriented x 3 and in no acute distress. Derm Skin is dry and supple bilateral. Negative open lesions or macerations. Remaining integument unremarkable. Nails are tender, long, thickened and dystrophic with subungual debris, consistent with onychomycosis, 1-5 bilateral. No signs of infection noted.  Vasc  DP and PT pedal pulses palpable bilaterally. Temperature gradient within normal limits.  Neuro Epicritic and protective threshold sensation grossly intact bilaterally.  Musculoskeletal Exam No symptomatic pedal deformities noted bilateral. Muscular strength within normal limits. Clinical evidence of a hammertoe deformity noted 2-5 bilateral. There is no associated tenderness to palpation  ASSESSMENT 1. Pain due to onychomycosis of toenail both 2.  Hammertoe deformity 2-5 bilateral  PLAN OF CARE 1. Patient evaluated today.  2. Instructed to maintain good pedal hygiene and foot care.  3. Mechanical debridement of nails 1-5 bilaterally performed using a nail nipper. Filed with dremel without incident.  4. Today we also discussed hammertoe pathology and the deformity to her hammertoes. She is concerned because she states that they have only developed over the past year. Recommend conservative treatment good supportive shoes that do not irritate the hammertoes. She states  that she had heard about surgery to correct for the hammertoes however we are going to pursue conservative treatment 5.  Return to clinic in 3 months for routine foot care   Edrick Kins, DPM [facility]  Dr. Edrick Kins, DPM    [address], [place] [address]                Office [phone]  Fax [phone]

## 2020-07-05 ENCOUNTER — Ambulatory Visit (INDEPENDENT_AMBULATORY_CARE_PROVIDER_SITE_OTHER): Payer: Medicare Other

## 2020-07-05 DIAGNOSIS — J441 Chronic obstructive pulmonary disease with (acute) exacerbation: Secondary | ICD-10-CM | POA: Diagnosis not present

## 2020-07-05 DIAGNOSIS — J209 Acute bronchitis, unspecified: Secondary | ICD-10-CM | POA: Diagnosis not present

## 2020-07-05 DIAGNOSIS — J449 Chronic obstructive pulmonary disease, unspecified: Secondary | ICD-10-CM

## 2020-07-05 DIAGNOSIS — R2689 Other abnormalities of gait and mobility: Secondary | ICD-10-CM | POA: Diagnosis not present

## 2020-07-05 DIAGNOSIS — R293 Abnormal posture: Secondary | ICD-10-CM | POA: Diagnosis not present

## 2020-07-05 DIAGNOSIS — R4189 Other symptoms and signs involving cognitive functions and awareness: Secondary | ICD-10-CM | POA: Diagnosis not present

## 2020-07-05 DIAGNOSIS — M6281 Muscle weakness (generalized): Secondary | ICD-10-CM | POA: Diagnosis not present

## 2020-07-06 DIAGNOSIS — J209 Acute bronchitis, unspecified: Secondary | ICD-10-CM | POA: Diagnosis not present

## 2020-07-06 DIAGNOSIS — J441 Chronic obstructive pulmonary disease with (acute) exacerbation: Secondary | ICD-10-CM | POA: Diagnosis not present

## 2020-07-06 DIAGNOSIS — R293 Abnormal posture: Secondary | ICD-10-CM | POA: Diagnosis not present

## 2020-07-06 DIAGNOSIS — R2689 Other abnormalities of gait and mobility: Secondary | ICD-10-CM | POA: Diagnosis not present

## 2020-07-06 DIAGNOSIS — R4189 Other symptoms and signs involving cognitive functions and awareness: Secondary | ICD-10-CM | POA: Diagnosis not present

## 2020-07-06 DIAGNOSIS — M6281 Muscle weakness (generalized): Secondary | ICD-10-CM | POA: Diagnosis not present

## 2020-07-14 NOTE — Chronic Care Management (AMB) (Signed)
  Chronic Care Management   Note   Name: OLAYINKA GATHERS MRN: [id number] DOB: 1926-07-29   Care Coordination Only: [facility] required an update regarding Mrs. Bazzano's functional status. Initial request for a functional assessment was submitted prior to her hospitalization in May. A voice message was left for the [facility] staff requesting a return call. Will submit needed documents or forward to [facility] for updates as required to ensure long term care needs are addressed.   Follow up plan: A member of the care management team will follow up within the next two weeks.    [facility] Care Management [facility] [phone]

## 2020-07-18 ENCOUNTER — Ambulatory Visit (INDEPENDENT_AMBULATORY_CARE_PROVIDER_SITE_OTHER): Payer: Medicare Other | Admitting: Family Medicine

## 2020-07-18 ENCOUNTER — Other Ambulatory Visit: Payer: Self-pay

## 2020-07-18 VITALS — BP 125/87 | HR 103

## 2020-07-18 DIAGNOSIS — J189 Pneumonia, unspecified organism: Secondary | ICD-10-CM | POA: Diagnosis not present

## 2020-07-18 DIAGNOSIS — J9611 Chronic respiratory failure with hypoxia: Secondary | ICD-10-CM | POA: Diagnosis not present

## 2020-07-18 NOTE — Progress Notes (Signed)
Established patient visit   [name]   DOB: 1926/08/07   87 y.o. Female  MRN: [id number] Visit Date: 07/18/2020  Today's healthcare provider: Vernie Murders, PA-C   No chief complaint on file.  Subjective    HPI  Patient is a 87 year old female who presents for follow up of her COPD/Pneumonia.  Patient was hospitalized in May and then went to [facility].  Prior to her admission she had been in assisted living.  She is regaining her health but it has clearly been diminished and she is now in the skilled nursing division.   She now uses continuous oxygen at 2 LPM.  Past Medical History:  Diagnosis Date   Cataract    COPD (chronic obstructive pulmonary disease) (HCC)    Depression    Difficulty swallowing    Frequent headaches    Hearing loss    Hypertension    Hypothyroidism    Reflux    Past Surgical History:  Procedure Laterality Date   ABDOMINAL HYSTERECTOMY     APPENDECTOMY     LUMBAR LAMINECTOMY     PARATHYROIDECTOMY     TOTAL HIP ARTHROPLASTY     x 4   Social History   Tobacco Use   Smoking status: Never   Smokeless tobacco: Never   Tobacco comments:    quit 1954  Vaping Use   Vaping Use: Never used  Substance Use Topics   Alcohol use: No   Drug use: No   Family Status  Relation Name Status   Mother  Deceased at age 35   Father  Deceased at age 39       MI   Allergies  Allergen Reactions   Sulfa Antibiotics Rash and Itching    Other reaction(s): Diarrhea and vomiting (finding)   Erythromycin Nausea And Vomiting    Other reaction(s): Diarrhea and vomiting (finding)   Aspirin Other (See Comments), Tinitus and Nausea And Vomiting    Ringing of the ears, caused hearing loss both ears Ringing in ears   Contrast Media [Iodinated Diagnostic Agents] Rash and Hives   Latex Rash and Itching   Penicillin G Rash   Penicillins Rash    Other reaction(s): UNKNOWN   Tape Rash    Other reaction(s): UNKNOWN Adhesive Other  reaction(s): UNKNOWN Adhesive       Medications: Outpatient Medications Prior to Visit  Medication Sig   acetaminophen (TYLENOL) 650 MG CR tablet Take 650 mg by mouth every 8 (eight) hours as needed for pain.   AMBULATORY NON FORMULARY MEDICATION Medication Name: incentive spirometry Use as directed   Dextromethorphan-guaiFENesin (DELSYM COUGH/CHEST CONGEST DM) 5-100 MG/5ML LIQD Take 5 mLs by mouth every 12 (twelve) hours as needed (cough).   feeding supplement, GLUCERNA SHAKE, (GLUCERNA SHAKE) LIQD Take 237 mLs by mouth 3 (three) times daily between meals.   fluticasone (FLONASE) 50 MCG/ACT nasal spray USE 2 SPRAYS NASALLY DAILY (Patient taking differently: Place 2 sprays into both nostrils daily. USE 2 SPRAYS NASALLY DAILY)   furosemide (LASIX) 20 MG tablet Take 1 tablet (20 mg total) by mouth daily for 5 days.   guaifenesin (HUMIBID E) 400 MG TABS tablet Take 400 mg by mouth in the morning and at bedtime.   hydrocortisone 1 % lotion Apply 1 application topically as needed for itching.   ipratropium-albuterol (DUONEB) 0.5-2.5 (3) MG/3ML SOLN Take 3 mLs by nebulization 3 (three) times daily.   levocetirizine (XYZAL) 5 MG tablet Take  5 mg by mouth every evening.   loperamide (IMODIUM) 2 MG capsule Take 2 mg by mouth as needed for diarrhea or loose stools.   magnesium hydroxide (MILK OF MAGNESIA) 400 MG/5ML suspension Take by mouth daily as needed for mild constipation. 2 tbsp   Mouthwashes (MOUTHWASH/GARGLE MT) Use as directed 5 mLs in the mouth or throat in the morning, at noon, in the evening, and at bedtime. 1 tsp by mouth as needed for sore mouth and tongue   Multiple Vitamin (MULTIVITAMIN WITH MINERALS) TABS tablet Take 1 tablet by mouth daily.   nystatin (MYCOSTATIN/NYSTOP) powder Apply 1 application topically 2 (two) times daily as needed.   OXYGEN Inhale 2 L into the lungs continuous as needed.   pantoprazole (PROTONIX) 20 MG tablet Take 1 tablet (20 mg total) by mouth 2 (two) times  daily.   predniSONE (DELTASONE) 20 MG tablet Take 3 tablets once daily for 3 days followed by 2 tablets once daily for 3 days followed by 1 tablet once daily for 3 days and then stop   SPIRIVA HANDIHALER 18 MCG inhalation capsule PLACE 1 CAPSULE INTO INHALER AND INHALE DAILY (Patient taking differently: Place 18 mcg into inhaler and inhale at bedtime.)   SYNTHROID 50 MCG tablet TAKE 1 TABLET DAILY (Patient taking differently: Take 50 mcg by mouth daily.)   triamcinolone (KENALOG) 0.1 %    venlafaxine XR (EFFEXOR-XR) 75 MG 24 hr capsule TAKE 1 CAPSULE DAILY WITH BREAKFAST (Patient taking differently: Take 75 mg by mouth daily with breakfast.)   No facility-administered medications prior to visit.    Review of Systems  Respiratory:  Positive for shortness of breath (chronic but improving). Negative for cough and wheezing.   Cardiovascular:  Negative for chest pain and leg swelling.      Objective    BP 125/87 (BP Location: Right Arm, Patient Position: Sitting, Cuff Size: Normal)   Pulse (!) 103   SpO2 100%     Physical Exam Constitutional:      General: She is not in acute distress.    Appearance: She is well-developed.  HENT:     Head: Normocephalic and atraumatic.     Right Ear: Hearing normal.     Left Ear: Hearing normal.     Nose: Nose normal.     Mouth/Throat:     Mouth: Mucous membranes are moist.     Pharynx: Oropharynx is clear.     Comments: Edentulous with dentures. Eyes:     General: Lids are normal. No scleral icterus.       Right eye: No discharge.        Left eye: No discharge.     Conjunctiva/sclera: Conjunctivae normal.  Pulmonary:     Effort: Pulmonary effort is normal. No respiratory distress.     Breath sounds: Rales present.     Comments: Improves with a cough. Abdominal:     General: Bowel sounds are normal.  Musculoskeletal:        General: Normal range of motion.  Skin:    Findings: No lesion or rash.  Neurological:     Mental Status: She is  alert and oriented to person, place, and time.  Psychiatric:        Speech: Speech normal.        Behavior: Behavior normal.        Thought Content: Thought content normal.      No results found for any visits on 07/18/20.  Assessment & Plan  1. Community acquired pneumonia of right upper lobe of lung Follow up of CAP. Still having some rales but they clear with a single cough. Continues oxygen by nasal cannula and still on inhalation therapy. No fever or significant coughing now. Feeling improved since hospitalization. Now in skilled nursing facility. Getting PT&OT but can't walk - using wheelchair.  2. Chronic hypoxemic respiratory failure (HCC) Using Oxygen at 2LPM by nasal cannula continuously with inhalers as above. Scheduled for follow up with Dr. Brita Romp in September 2022.   No follow-ups on file.      I, Allenmichael Mcpartlin, PA-C, have reviewed all documentation for this visit. The documentation on 07/18/20 for the exam, diagnosis, procedures, and orders are all accurate and complete.    Vernie Murders, PA-C  [employer] [phone] (phone) [phone] (fax)  [place]

## 2020-07-20 DIAGNOSIS — D649 Anemia, unspecified: Secondary | ICD-10-CM | POA: Diagnosis not present

## 2020-07-20 DIAGNOSIS — I509 Heart failure, unspecified: Secondary | ICD-10-CM | POA: Diagnosis not present

## 2020-07-25 DIAGNOSIS — R0602 Shortness of breath: Secondary | ICD-10-CM | POA: Diagnosis not present

## 2020-07-25 DIAGNOSIS — R0989 Other specified symptoms and signs involving the circulatory and respiratory systems: Secondary | ICD-10-CM | POA: Diagnosis not present

## 2020-07-28 ENCOUNTER — Ambulatory Visit (INDEPENDENT_AMBULATORY_CARE_PROVIDER_SITE_OTHER): Payer: Medicare Other

## 2020-07-28 ENCOUNTER — Encounter: Payer: Self-pay | Admitting: Oncology

## 2020-07-28 DIAGNOSIS — J449 Chronic obstructive pulmonary disease, unspecified: Secondary | ICD-10-CM | POA: Diagnosis not present

## 2020-07-28 NOTE — Chronic Care Management (AMB) (Signed)
Chronic Care Management   CCM RN Visit Note  07/28/2020 Name: Sandra Brown MRN: [id number] DOB: [birth year]  Subjective: Sandra Brown is a 87 y.o. year old female who is a primary care patient of Bacigalupo, Dionne Bucy, MD. The care management team was consulted for assistance with care coordination needs.    Follow-up care coordination was conducted today in response to provider's referral for assistance with completing functional assessment documents.  Consent to Services:  The patient's caregiver/daughter was given information about Chronic Care Management services, agreed to services, and gave verbal consent prior to initiation of services.  Please see initial visit note for detailed documentation.    Assessment: Review of patient past medical history, allergies, medications, health status, including review of consultants reports, laboratory and other test data, was performed as part of comprehensive evaluation and provision of chronic care management services.   SDOH (Social Determinants of Health) assessments and interventions performed:  No  CCM Care Plan  Allergies  Allergen Reactions   Sulfa Antibiotics Rash and Itching    Other reaction(s): Diarrhea and vomiting (finding)   Erythromycin Nausea And Vomiting    Other reaction(s): Diarrhea and vomiting (finding)   Aspirin Other (See Comments), Tinitus and Nausea And Vomiting    Ringing of the ears, caused hearing loss both ears Ringing in ears   Contrast Media [Iodinated Diagnostic Agents] Rash and Hives   Latex Rash and Itching   Penicillin G Rash   Penicillins Rash    Other reaction(s): UNKNOWN   Tape Rash    Other reaction(s): UNKNOWN Adhesive Other reaction(s): UNKNOWN Adhesive    Outpatient Encounter Medications as of 07/28/2020  Medication Sig Note   acetaminophen (TYLENOL) 650 MG CR tablet Take 650 mg by mouth every 8 (eight) hours as needed for pain.    AMBULATORY NON FORMULARY MEDICATION Medication  Name: incentive spirometry Use as directed    Dextromethorphan-guaiFENesin (DELSYM COUGH/CHEST CONGEST DM) 5-100 MG/5ML LIQD Take 5 mLs by mouth every 12 (twelve) hours as needed (cough).    feeding supplement, GLUCERNA SHAKE, (GLUCERNA SHAKE) LIQD Take 237 mLs by mouth 3 (three) times daily between meals.    fluticasone (FLONASE) 50 MCG/ACT nasal spray USE 2 SPRAYS NASALLY DAILY (Patient taking differently: Place 2 sprays into both nostrils daily. USE 2 SPRAYS NASALLY DAILY)    furosemide (LASIX) 20 MG tablet Take 1 tablet (20 mg total) by mouth daily for 5 days.    guaifenesin (HUMIBID E) 400 MG TABS tablet Take 400 mg by mouth in the morning and at bedtime.    hydrocortisone 1 % lotion Apply 1 application topically as needed for itching.    ipratropium-albuterol (DUONEB) 0.5-2.5 (3) MG/3ML SOLN Take 3 mLs by nebulization 3 (three) times daily.    levocetirizine (XYZAL) 5 MG tablet Take 5 mg by mouth every evening.    loperamide (IMODIUM) 2 MG capsule Take 2 mg by mouth as needed for diarrhea or loose stools.    magnesium hydroxide (MILK OF MAGNESIA) 400 MG/5ML suspension Take by mouth daily as needed for mild constipation. 2 tbsp    Mouthwashes (MOUTHWASH/GARGLE MT) Use as directed 5 mLs in the mouth or throat in the morning, at noon, in the evening, and at bedtime. 1 tsp by mouth as needed for sore mouth and tongue    Multiple Vitamin (MULTIVITAMIN WITH MINERALS) TABS tablet Take 1 tablet by mouth daily.    nystatin (MYCOSTATIN/NYSTOP) powder Apply 1 application topically 2 (two) times daily as needed.  OXYGEN Inhale 2 L into the lungs continuous as needed.    pantoprazole (PROTONIX) 20 MG tablet Take 1 tablet (20 mg total) by mouth 2 (two) times daily.    predniSONE (DELTASONE) 20 MG tablet Take 3 tablets once daily for 3 days followed by 2 tablets once daily for 3 days followed by 1 tablet once daily for 3 days and then stop (Patient not taking: Reported on 07/18/2020)    SPIRIVA HANDIHALER  18 MCG inhalation capsule PLACE 1 CAPSULE INTO INHALER AND INHALE DAILY (Patient taking differently: Place 18 mcg into inhaler and inhale at bedtime.)    SYNTHROID 50 MCG tablet TAKE 1 TABLET DAILY (Patient taking differently: Take 50 mcg by mouth daily.) 05/30/2020: Patient's MAR doesn't specify "Brand"   triamcinolone (KENALOG) 0.1 %     venlafaxine XR (EFFEXOR-XR) 75 MG 24 hr capsule TAKE 1 CAPSULE DAILY WITH BREAKFAST (Patient taking differently: Take 75 mg by mouth daily with breakfast.)    No facility-administered encounter medications on file as of 07/28/2020.    Patient Active Problem List   Diagnosis Date Noted   Malnutrition of moderate degree 06/01/2020   HCAP (healthcare-associated pneumonia) 05/30/2020   Acute on chronic respiratory failure with hypoxia ([place]) 05/30/2020   Respiratory failure, acute ([place]) 05/30/2020   Pneumonia 05/30/2020   Cellulitis of right lower leg 03/29/2020   MDD (major depressive disorder), recurrent, in full remission ([place]) 11/11/2019   Panic attacks 03/16/2019   MDD (major depressive disorder), recurrent episode, mild ([place]) 09/30/2018   Insomnia due to mental condition 09/30/2018   Skin-picking disorder 09/30/2018   Lymphedema 01/27/2018   Chronic venous insufficiency 01/27/2018   Cellulitis of leg, left 12/23/2017   Swelling of limb 12/23/2017   CKD (chronic kidney disease) stage 3, GFR 30-59 ml/min (HCC) 10/02/2015   Hypothyroidism 10/02/2015   IBS (irritable bowel syndrome) 12/15/2014   Allergic rhinitis 11/14/2014   Facet syndrome, lumbar 09/21/2014   Sacroiliac joint dysfunction 09/21/2014   Hyperlipemia 09/01/2014   Depression 08/29/2014   Iron deficiency anemia 07/14/2014   GERD (gastroesophageal reflux disease) 07/14/2014   COPD (chronic obstructive pulmonary disease) ([place]) 06/24/2014   Incomplete bladder emptying 12/09/2012   Urge incontinence 12/09/2012   Obstruction of urinary tract 12/09/2012     Patient Care Plan: Care  Coordination/Screening Forms  Completed 07/28/2020   Problem Identified: Care Coordination/ScreeningForms Resolved 07/28/2020     Goal: Care Coordination/Screening Forms Completed 07/28/2020  Start Date: 07/28/2020  Priority: High  Current Barriers:  Care Coordination needs r/t obtaining Functional and Cognitive assessment forms in patient with COPD.  Case Manager Clinical Goal(s): Patient's Functional/Cognitive assessment forms will be routed and updated by [facility] staff.  Interventions:  Collaboration with Brita Romp Dionne Bucy, MD regarding development and update of comprehensive plan of care as evidenced by provider attestation and co-signature Inter-disciplinary care team collaboration (see longitudinal plan of care) [facility] previously requested updated functional/cognitive assessment forms from patient's PCP. Unable to complete the initial request due to patient being hospitalized. Patient was transitioned to skilled nursing following hospital discharge. [facility] requested contact information for [place] to ensure documents were completed to reflect the patient's current status.    No Further Outreach required        No further care management outreach required.    [facility] Care Management [facility] [phone]

## 2020-08-04 ENCOUNTER — Other Ambulatory Visit: Payer: Self-pay

## 2020-08-04 ENCOUNTER — Encounter: Payer: Self-pay | Admitting: Psychiatry

## 2020-08-04 ENCOUNTER — Telehealth (INDEPENDENT_AMBULATORY_CARE_PROVIDER_SITE_OTHER): Payer: Medicare Other | Admitting: Psychiatry

## 2020-08-04 DIAGNOSIS — F3342 Major depressive disorder, recurrent, in full remission: Secondary | ICD-10-CM

## 2020-08-04 DIAGNOSIS — F0670 Mild neurocognitive disorder due to known physiological condition without behavioral disturbance: Secondary | ICD-10-CM | POA: Insufficient documentation

## 2020-08-04 DIAGNOSIS — F424 Excoriation (skin-picking) disorder: Secondary | ICD-10-CM | POA: Diagnosis not present

## 2020-08-04 DIAGNOSIS — G4709 Other insomnia: Secondary | ICD-10-CM

## 2020-08-04 DIAGNOSIS — G3184 Mild cognitive impairment, so stated: Secondary | ICD-10-CM | POA: Diagnosis not present

## 2020-08-04 NOTE — Progress Notes (Signed)
Virtual Visit via Video Note  I connected with Sandra Brown on 08/04/20 at 11:00 AM EDT by a video enabled telemedicine application and verified that I am speaking with the correct person using two identifiers.  Location Provider Location : [facility] Patient Location : SNF,   Participants: Patient ,Daughter - [name], Provider    I discussed the limitations of evaluation and management by telemedicine and the availability of in person appointments. The patient expressed understanding and agreed to proceed.    I discussed the assessment and treatment plan with the patient. The patient was provided an opportunity to ask questions and all were answered. The patient agreed with the plan and demonstrated an understanding of the instructions.   The patient was advised to call back or seek an in-person evaluation if the symptoms worsen or if the condition fails to improve as anticipated.    [facility] MD OP Progress Note  08/04/2020 12:49 PM Sandra Brown  MRN:  [id number]  Chief Complaint:  Chief Complaint   Follow-up; Depression; Anxiety; Dementia    HPI: Sandra Brown is a 87 year old Caucasian female, lives at a skilled nursing facility, has a history of MDD, skin picking disorder, COPD, hypothyroidism, hypertension, cognitive disorder was evaluated by telemedicine today.  Collateral information obtained from daughter-[name].  Patient with recent  respiratory failure with COPD exacerbation, community-acquired pneumonia-was admitted and discharged-06/03/2020-stabilized on prednisone, doxycycline.  Patient today appeared to be alert and oriented to person, situation and place.  She could not give a lot of history about what happened in the past few months.  Patient reported she just misses her old friends since she moved to this new facility.  She reports even though she is trying to feel good she does not feel as good as she wants to with regards to her physical health.  Patient did  not express any suicidality.  Did not appear to be paranoid or delusional.  According to daughter's patient at the end of April was entered into a rehab facility due to her health problems.  Thereafter she developed pneumonia and was admitted and treated for the same.  Patient since then has a lot of fatigue and has needed physical therapy, support, oxygen 24/7.  Patient also lost her dentures when she was admitted to the hospital and hence has trouble eating solid food.  She is currently in the process of getting her dentures back.  Patient however is at a good facility right now and she seems to be improving and getting along with others.  She did have medication readjustments and changes made.  Daughter agrees to send the information to [place] by fax,  updated medication list.    Visit Diagnosis:    ICD-10-CM   1. MDD (major depressive disorder), recurrent, in full remission ([place])  F33.42     2. Other insomnia  G47.09     3. Skin-picking disorder  F42.4     4. Mild neurocognitive disorder due to multiple etiologies  G31.84       Past Psychiatric History: Reviewed past psychiatric history from progress note on 03/21/2017.  Past Medical History:  Past Medical History:  Diagnosis Date   Cataract    COPD (chronic obstructive pulmonary disease) ([place])    Depression    Difficulty swallowing    Frequent headaches    Hearing loss    Hypertension    Hypothyroidism    Reflux     Past Surgical History:  Procedure Laterality Date   ABDOMINAL  HYSTERECTOMY     APPENDECTOMY     LUMBAR LAMINECTOMY     PARATHYROIDECTOMY     TOTAL HIP ARTHROPLASTY     x 4    Family Psychiatric History: Reviewed family psychiatric history from progress note on 03/21/2017.  Family History:  Family History  Problem Relation Age of Onset   Stroke Mother    Hypertension Mother    Heart disease Father    Hypertension Father     Social History: Reviewed social history from progress note on 03/21/2017. Social  History   Socioeconomic History   Marital status: Widowed    Spouse name: Not on file   Number of children: Not on file   Years of education: Not on file   Highest education level: Not on file  Occupational History   Not on file  Tobacco Use   Smoking status: Never   Smokeless tobacco: Never   Tobacco comments:    quit 1954  Vaping Use   Vaping Use: Never used  Substance and Sexual Activity   Alcohol use: No   Drug use: No   Sexual activity: Not Currently  Other Topics Concern   Not on file  Social History Narrative   Not on file   Social Determinants of Health   Financial Resource Strain: Not on file  Food Insecurity: No Food Insecurity   Worried About Running Out of Food in the Last Year: Never true   [place] in the Last Year: Never true  Transportation Needs: No Transportation Needs   Lack of Transportation (Medical): No   Lack of Transportation (Non-Medical): No  Physical Activity: Not on file  Stress: Not on file  Social Connections: Not on file    Allergies:  Allergies  Allergen Reactions   Sulfa Antibiotics Rash and Itching    Other reaction(s): Diarrhea and vomiting (finding)   Erythromycin Nausea And Vomiting    Other reaction(s): Diarrhea and vomiting (finding)   Aspirin Other (See Comments), Tinitus and Nausea And Vomiting    Ringing of the ears, caused hearing loss both ears Ringing in ears   Contrast Media [Iodinated Diagnostic Agents] Rash and Hives   Latex Rash and Itching   Penicillin G Rash   Penicillins Rash    Other reaction(s): UNKNOWN   Tape Rash    Other reaction(s): UNKNOWN Adhesive Other reaction(s): UNKNOWN Adhesive    Metabolic Disorder Labs: No results found for: HGBA1C, MPG No results found for: PROLACTIN No results found for: CHOL, TRIG, HDL, CHOLHDL, VLDL, LDLCALC Lab Results  Component Value Date   TSH 0.85 11/23/2019   TSH 1.520 10/06/2019    Therapeutic Level Labs: No results found for: LITHIUM No  results found for: VALPROATE No components found for:  CBMZ  Current Medications: Current Outpatient Medications  Medication Sig Dispense Refill   acetaminophen (TYLENOL) 650 MG CR tablet Take 650 mg by mouth every 8 (eight) hours as needed for pain.     AMBULATORY NON FORMULARY MEDICATION Medication Name: incentive spirometry Use as directed 1 each 0   Dextromethorphan-guaiFENesin (DELSYM COUGH/CHEST CONGEST DM) 5-100 MG/5ML LIQD Take 5 mLs by mouth every 12 (twelve) hours as needed (cough).     feeding supplement, GLUCERNA SHAKE, (GLUCERNA SHAKE) LIQD Take 237 mLs by mouth 3 (three) times daily between meals.  0   fluticasone (FLONASE) 50 MCG/ACT nasal spray USE 2 SPRAYS NASALLY DAILY (Patient taking differently: Place 2 sprays into both nostrils daily. USE 2 SPRAYS NASALLY DAILY) 48 g  1   furosemide (LASIX) 20 MG tablet Take 1 tablet (20 mg total) by mouth daily for 5 days. 5 tablet 0   guaifenesin (HUMIBID E) 400 MG TABS tablet Take 400 mg by mouth in the morning and at bedtime.     hydrocortisone 1 % lotion Apply 1 application topically as needed for itching.     ipratropium-albuterol (DUONEB) 0.5-2.5 (3) MG/3ML SOLN Take 3 mLs by nebulization 3 (three) times daily.     levocetirizine (XYZAL) 5 MG tablet Take 5 mg by mouth every evening.     loperamide (IMODIUM) 2 MG capsule Take 2 mg by mouth as needed for diarrhea or loose stools.     magnesium hydroxide (MILK OF MAGNESIA) 400 MG/5ML suspension Take by mouth daily as needed for mild constipation. 2 tbsp     Mouthwashes (MOUTHWASH/GARGLE MT) Use as directed 5 mLs in the mouth or throat in the morning, at noon, in the evening, and at bedtime. 1 tsp by mouth as needed for sore mouth and tongue     Multiple Vitamin (MULTIVITAMIN WITH MINERALS) TABS tablet Take 1 tablet by mouth daily.     nystatin (MYCOSTATIN/NYSTOP) powder Apply 1 application topically 2 (two) times daily as needed.     OXYGEN Inhale 2 L into the lungs continuous as needed.      pantoprazole (PROTONIX) 20 MG tablet Take 1 tablet (20 mg total) by mouth 2 (two) times daily. 180 tablet 0   predniSONE (DELTASONE) 20 MG tablet Take 3 tablets once daily for 3 days followed by 2 tablets once daily for 3 days followed by 1 tablet once daily for 3 days and then stop (Patient not taking: Reported on 07/18/2020) 18 tablet 0   SPIRIVA HANDIHALER 18 MCG inhalation capsule PLACE 1 CAPSULE INTO INHALER AND INHALE DAILY (Patient taking differently: Place 18 mcg into inhaler and inhale at bedtime.) 90 capsule 0   SYNTHROID 50 MCG tablet TAKE 1 TABLET DAILY (Patient taking differently: Take 50 mcg by mouth daily.) 90 tablet 1   triamcinolone (KENALOG) 0.1 %      venlafaxine XR (EFFEXOR-XR) 75 MG 24 hr capsule TAKE 1 CAPSULE DAILY WITH BREAKFAST (Patient taking differently: Take 75 mg by mouth daily with breakfast.) 90 capsule 3   No current facility-administered medications for this visit.     Musculoskeletal: Strength & Muscle Tone:  UTA Gait & Station:  UTA Patient leans: N/A  Psychiatric Specialty Exam: Review of Systems  Unable to perform ROS: Dementia   There were no vitals taken for this visit.There is no height or weight on file to calculate BMI.  General Appearance: Casual  Eye Contact:  Minimal  Speech:  Slow  Volume:  Decreased  Mood:  Euthymic  Affect:  Flat  Thought Process:  Goal Directed and Descriptions of Associations: Intact  Orientation:  Other:  self, situation  Thought Content: Logical   Suicidal Thoughts:  No  Homicidal Thoughts:  No  Memory:  Immediate;   Limited Recent;   Limited Remote;   Limited  Judgement:  Other:  limited  Insight:  Shallow  Psychomotor Activity:  Decreased  Concentration:  Concentration: Fair and Attention Span: Fair  Recall:   limited  Fund of Knowledge:  limited  Language: Fair  Akathisia:  No  Handed:  Right  AIMS (if indicated): not done  Assets:  Desire for Improvement Housing Social Support  ADL's:  Intact   Cognition: Impaired,  Moderate  Sleep:  Fair   Screenings: GAD-7  [facility] Visit from 05/12/2020 in [facility]  Total GAD-7 Score 3      PHQ2-9    [facility] Office Visit from 05/12/2020 in [facility] Visit from 10/06/2019 in [facility] Procedure visit from 09/04/2015 in [facility] Procedure visit from 10/03/2014 in [facility] Office Visit from 09/21/2014 in [facility]  PHQ-2 Total Score 0 2 0 0 1  PHQ-9 Total Score 0 4 -- -- --      Flowsheet Row ED to Hosp-Admission (Discharged) from 05/30/2020 in [facility] ED from 04/01/2020 in [facility]  C-SSRS RISK CATEGORY No Risk No Risk        Assessment and Plan: Sandra Brown is a 87 year old Caucasian female who has a history of depression, COPD, hypothyroidism, hypertension, renal function abnormalities was evaluated by telemedicine today.  Patient is currently making progress with regards to her mood.  Plan as noted below.  Plan MDD in remission Continue venlafaxine extended release 75 mg p.o. daily Patient's Wellbutrin likely discontinued by her other provider.  Since she is currently doing well and if she is off of the Wellbutrin we will not restart it. However I will have to review her most recent medication list which will be faxed over.   Skin picking disorder-stable Will monitor closely. Continue venlafaxine as prescribed  Insomnia-improving Continue sleep hygiene techniques.  Cognitive disorder , mild - unstable Will monitor closely She has 24/7 support at this time. Continue medications as prescribed.  Collateral information obtained from daughter as noted above.  Reviewed notes per Dr. Judieth Keens 05/30/2020, Dr. Gilbert-06/20/2020-patient being  treated for community-acquired pneumonia, COPD.  Once I review medication list which will be faxed over will sent new prescriptions to [facility].  Follow-up in clinic in 4 to 5 months or sooner if needed.  This note was generated in part or whole with voice recognition software. Voice recognition is usually quite accurate but there are transcription errors that can and very often do occur. I apologize for any typographical errors that were not detected and corrected.      Ursula Alert, MD 08/04/2020, 6:23 PM

## 2020-08-08 ENCOUNTER — Telehealth: Payer: Self-pay | Admitting: Psychiatry

## 2020-08-08 DIAGNOSIS — F3342 Major depressive disorder, recurrent, in full remission: Secondary | ICD-10-CM

## 2020-08-08 MED ORDER — VENLAFAXINE HCL ER 75 MG PO CP24: 75.0000 mg | ORAL_CAPSULE | Freq: Every day | ORAL | 3 refills | Status: AC

## 2020-08-08 NOTE — Telephone Encounter (Signed)
I have reviewed medication list provided by [facility].  Updated .  I have sent venlafaxine to pharmacy.- [facility].  Patient is no longer on Wellbutrin.

## 2020-08-28 ENCOUNTER — Ambulatory Visit: Payer: Self-pay | Admitting: Family Medicine

## 2020-09-16 NOTE — Progress Notes (Signed)
[facility]  [phone] [phone]  ID: Sandra Brown OB: December 04, 1926  MR#: [id number]  [id number]  Patient Care Team: Virginia Crews, MD as PCP - General (Family Medicine)  CHIEF COMPLAINT: Iron deficiency anemia.  INTERVAL HISTORY: Patient returns to clinic today for repeat laboratory work, further evaluation, and continuation of IV iron if necessary.  She continues to have chronic weakness and fatigue. She has no neurologic complaints.  She denies any recent fevers or illnesses.  She has a good appetite and denies weight loss.  She has no chest pain, shortness of breath, cough, or hemoptysis.  He denies any nausea, vomiting, constipation, or diarrhea.  She has no melena or hematochezia.  She has no urinary complaints.  Patient offers no further specific complaints today.  REVIEW OF SYSTEMS:   Review of Systems  Constitutional:  Positive for malaise/fatigue. Negative for fever and weight loss.  Respiratory: Negative.  Negative for cough, hemoptysis and shortness of breath.   Cardiovascular: Negative.  Negative for chest pain and leg swelling.  Gastrointestinal: Negative.  Negative for abdominal pain, blood in stool and melena.  Genitourinary: Negative.  Negative for hematuria.  Musculoskeletal: Negative.  Negative for back pain.  Skin: Negative.  Negative for rash.  Neurological:  Positive for weakness. Negative for focal weakness and headaches.  Psychiatric/Behavioral: Negative.  The patient is not nervous/anxious.    As per HPI. Otherwise, a complete review of systems is negative.  PAST MEDICAL HISTORY: Past Medical History:  Diagnosis Date   Cataract    COPD (chronic obstructive pulmonary disease) ([facility])    Depression    Difficulty swallowing    Frequent headaches    Hearing loss    Hypertension    Hypothyroidism    Reflux     PAST SURGICAL HISTORY: Past Surgical History:  Procedure Laterality Date   ABDOMINAL  HYSTERECTOMY     APPENDECTOMY     LUMBAR LAMINECTOMY     PARATHYROIDECTOMY     TOTAL HIP ARTHROPLASTY     x 4    FAMILY HISTORY: Family History  Problem Relation Age of Onset   Stroke Mother    Hypertension Mother    Heart disease Father    Hypertension Father     ADVANCED DIRECTIVES (Y/N):  N  HEALTH MAINTENANCE: Social History   Tobacco Use   Smoking status: Never   Smokeless tobacco: Never   Tobacco comments:    quit 1954  Vaping Use   Vaping Use: Never used  Substance Use Topics   Alcohol use: No   Drug use: No     Colonoscopy:  PAP:  Bone density:  Lipid panel:  Allergies  Allergen Reactions   Sulfa Antibiotics Rash and Itching    Other reaction(s): Diarrhea and vomiting (finding)   Erythromycin Nausea And Vomiting    Other reaction(s): Diarrhea and vomiting (finding)   Aspirin Other (See Comments), Tinitus and Nausea And Vomiting    Ringing of the ears, caused hearing loss both ears Ringing in ears   Contrast Media [Iodinated Diagnostic Agents] Rash and Hives   Latex Rash and Itching   Penicillin G Rash   Penicillins Rash    Other reaction(s): UNKNOWN   Tape Rash    Other reaction(s): UNKNOWN Adhesive Other reaction(s): UNKNOWN Adhesive    Current Outpatient Medications  Medication Sig Dispense Refill   acetaminophen (TYLENOL) 650 MG CR tablet Take 650 mg by mouth every 8 (eight) hours as needed for pain.  AMBULATORY NON FORMULARY MEDICATION Medication Name: incentive spirometry Use as directed 1 each 0   Dextromethorphan-guaiFENesin (DELSYM COUGH/CHEST CONGEST DM) 5-100 MG/5ML LIQD Take 5 mLs by mouth every 12 (twelve) hours as needed (cough).     feeding supplement, GLUCERNA SHAKE, (GLUCERNA SHAKE) LIQD Take 237 mLs by mouth 3 (three) times daily between meals.  0   fluticasone (FLONASE) 50 MCG/ACT nasal spray USE 2 SPRAYS NASALLY DAILY (Patient taking differently: Place 2 sprays into both nostrils daily. USE 2 SPRAYS NASALLY DAILY) 48 g 1    Fluticasone-Umeclidin-Vilant (TRELEGY ELLIPTA) 100-62.5-25 MCG/INH AEPB Inhale 1 puff into the lungs daily. 1 each 11   guaifenesin (HUMIBID E) 400 MG TABS tablet Take 400 mg by mouth in the morning and at bedtime.     hydrocortisone 1 % lotion Apply 1 application topically as needed for itching.     ipratropium-albuterol (DUONEB) 0.5-2.5 (3) MG/3ML SOLN Take 3 mLs by nebulization 3 (three) times daily.     levocetirizine (XYZAL) 5 MG tablet Take 5 mg by mouth every evening.     loperamide (IMODIUM) 2 MG capsule Take 2 mg by mouth as needed for diarrhea or loose stools.     magnesium hydroxide (MILK OF MAGNESIA) 400 MG/5ML suspension Take by mouth daily as needed for mild constipation. 2 tbsp     Mouthwashes (MOUTHWASH/GARGLE MT) Use as directed 5 mLs in the mouth or throat in the morning, at noon, in the evening, and at bedtime. 1 tsp by mouth as needed for sore mouth and tongue     Multiple Vitamin (MULTIVITAMIN WITH MINERALS) TABS tablet Take 1 tablet by mouth daily.     nystatin (MYCOSTATIN/NYSTOP) powder Apply 1 application topically 2 (two) times daily as needed.     OXYGEN Inhale 2 L into the lungs continuous as needed.     pantoprazole (PROTONIX) 20 MG tablet Take 1 tablet (20 mg total) by mouth 2 (two) times daily. 180 tablet 0   SYNTHROID 50 MCG tablet TAKE 1 TABLET DAILY (Patient taking differently: Take 50 mcg by mouth daily.) 90 tablet 1   triamcinolone (KENALOG) 0.1 %      venlafaxine XR (EFFEXOR-XR) 75 MG 24 hr capsule Take 1 capsule (75 mg total) by mouth daily with breakfast. TAKE 1 CAPSULE DAILY WITH BREAKFAST 90 capsule 3   furosemide (LASIX) 20 MG tablet Take 1 tablet (20 mg total) by mouth daily for 5 days. 5 tablet 0   No current facility-administered medications for this visit.    OBJECTIVE: Vitals:   09/19/20 1336  BP: (!) 152/73  Pulse: (!) 104  Resp: 18  Temp: (!) 97.4 F (36.3 C)     Body mass index is 19.53 kg/m.    ECOG FS:2 - Symptomatic, <50% confined to  bed  General: Well-developed, well-nourished, no acute distress.  Sitting in a wheelchair. Eyes: Pink conjunctiva, anicteric sclera. HEENT: Normocephalic, moist mucous membranes. Lungs: No audible wheezing or coughing. Heart: Regular rate and rhythm. Abdomen: Soft, nontender, no obvious distention. Musculoskeletal: No edema, cyanosis, or clubbing. Neuro: Alert, answering all questions appropriately. Cranial nerves grossly intact. Skin: No rashes or petechiae noted. Psych: Normal affect.   LAB RESULTS:  Lab Results  Component Value Date   NA 141 09/19/2020   K 4.0 09/19/2020   CL 103 09/19/2020   CO2 32 09/19/2020   GLUCOSE 133 (H) 09/19/2020   BUN 14 09/19/2020   CREATININE 0.66 09/19/2020   CALCIUM 8.8 (L) 09/19/2020   PROT 6.9 09/19/2020  ALBUMIN 3.0 (L) 09/19/2020   AST 24 09/19/2020   ALT 14 09/19/2020   ALKPHOS 70 09/19/2020   BILITOT 0.3 09/19/2020   GFRNONAA >60 09/19/2020   GFRAA 41 11/23/2019    Lab Results  Component Value Date   WBC 7.1 09/19/2020   NEUTROABS 5.1 09/19/2020   HGB 8.5 (L) 09/19/2020   HCT 28.9 (L) 09/19/2020   MCV 91.7 09/19/2020   PLT 454 (H) 09/19/2020    Lab Results  Component Value Date   IRON 17 (L) 09/19/2020   TIBC 251 09/19/2020   IRONPCTSAT 7 (L) 09/19/2020   Lab Results  Component Value Date   FERRITIN 60 09/19/2020     STUDIES: No results found.   ASSESSMENT: Iron deficiency anemia.  PLAN:    1. Iron deficiency anemia: Patient's hemoglobin and iron stores have trended down and she is symptomatic. Previously, all of her other laboratory work is either negative or within normal limits.  [employer] myeloid panel revealed a variant of unknown clinical significance.  Proceed with 510 mg of IV Feraheme today.  Return to clinic in 1 week for second infusion.  Patient will then return to clinic in 3 months with repeat laboratory work, further evaluation, and consideration of treatment if necessary.   2.  Hypertension:  Patient's blood pressure is moderately elevated today.  Continue monitoring and treatment per primary care.  I spent a total of 30 minutes reviewing chart data, face-to-face evaluation with the patient, counseling and coordination of care as detailed above.   Patient expressed understanding and was in agreement with this plan. She also understands that She can call clinic at any time with any questions, concerns, or complaints.    Lloyd Huger, MD   09/19/2020 3:54 PM

## 2020-09-18 ENCOUNTER — Other Ambulatory Visit: Payer: Self-pay

## 2020-09-18 ENCOUNTER — Ambulatory Visit (INDEPENDENT_AMBULATORY_CARE_PROVIDER_SITE_OTHER): Payer: Medicare Other | Admitting: Family Medicine

## 2020-09-18 ENCOUNTER — Encounter: Payer: Self-pay | Admitting: Family Medicine

## 2020-09-18 ENCOUNTER — Encounter: Payer: Self-pay | Admitting: Oncology

## 2020-09-18 VITALS — BP 150/86 | HR 98 | Temp 98.0°F | Resp 16 | Wt 100.9 lb

## 2020-09-18 DIAGNOSIS — N182 Chronic kidney disease, stage 2 (mild): Secondary | ICD-10-CM | POA: Diagnosis not present

## 2020-09-18 DIAGNOSIS — E039 Hypothyroidism, unspecified: Secondary | ICD-10-CM

## 2020-09-18 DIAGNOSIS — J449 Chronic obstructive pulmonary disease, unspecified: Secondary | ICD-10-CM | POA: Diagnosis not present

## 2020-09-18 DIAGNOSIS — F3342 Major depressive disorder, recurrent, in full remission: Secondary | ICD-10-CM | POA: Diagnosis not present

## 2020-09-18 DIAGNOSIS — J9611 Chronic respiratory failure with hypoxia: Secondary | ICD-10-CM

## 2020-09-18 MED ORDER — TRELEGY ELLIPTA 100-62.5-25 MCG/INH IN AEPB: 1.0000 | INHALATION_SPRAY | Freq: Every day | RESPIRATORY_TRACT | 11 refills | Status: AC

## 2020-09-18 NOTE — Assessment & Plan Note (Signed)
Continue continuous O2 therapy

## 2020-09-18 NOTE — Assessment & Plan Note (Signed)
Chronic and uncontrolled  Having to use duonebs 2-3 times daily Will change spiriva to trelegy Continue O2 therapy as above

## 2020-09-18 NOTE — Assessment & Plan Note (Signed)
Continue to monitor Cr

## 2020-09-18 NOTE — Assessment & Plan Note (Signed)
Previously well controlled Continue Synthroid at current dose  Recheck TSH and adjust Synthroid as indicated   

## 2020-09-18 NOTE — Assessment & Plan Note (Signed)
Continue effexor Well controlled

## 2020-09-18 NOTE — Progress Notes (Signed)
Established patient visit   [name]   DOB: 03/23/1926   87 y.o. Female  MRN: [id number] Visit Date: 09/18/2020  Today's healthcare provider: Lavon Paganini, MD   Chief Complaint  Patient presents with   COPD   Subjective    COPD She complains of cough and wheezing. There is no shortness of breath. Associated symptoms include myalgias. Pertinent negatives include no chest pain, ear pain, fever, headaches or sore throat. Her past medical history is significant for COPD.    COPD, Follow up  She was last seen for this 2 months ago. Changes made include no changes.   She reports excellent compliance with treatment. She is not having side effects.  she uses rescue inhaler 3 per days.  She IS experiencing cough. She is still recovering from pneumonia and may be associated with the lingering cough.  Her daughter states a mobile unit was sent to [facility] to monitor her lungs to ensure her lungs were clear with no residual fluid.   She has mild wheezing and no fevers. she reports breathing is Unchanged.  Pulmonary Functions Testing Results:  No results found for: FEV1, FVC, FEV1FVC, TLC  She is taking effexor, delsym, mucinex, spiriva/advair. Tolerating all medications.   She has an appointment with hematology with Dr. Grayland Ormond to monitor her iron deficiency.   She is no longer seeing an audiologist and she used to wear hearing aids. Her daughter is requesting to examen her ears.  She is having pain in bilateral arms and she believes it is associated with maneuvers at [facility] to promote movement. She denies tenderness or bony tenderness.  -----------------------------------------------------------------------------------------     Medications: Outpatient Medications Prior to Visit  Medication Sig   acetaminophen (TYLENOL) 650 MG CR tablet Take 650 mg by mouth every 8 (eight) hours as needed for pain.   AMBULATORY NON FORMULARY MEDICATION  Medication Name: incentive spirometry Use as directed   Dextromethorphan-guaiFENesin (DELSYM COUGH/CHEST CONGEST DM) 5-100 MG/5ML LIQD Take 5 mLs by mouth every 12 (twelve) hours as needed (cough).   feeding supplement, GLUCERNA SHAKE, (GLUCERNA SHAKE) LIQD Take 237 mLs by mouth 3 (three) times daily between meals.   fluticasone (FLONASE) 50 MCG/ACT nasal spray USE 2 SPRAYS NASALLY DAILY (Patient taking differently: Place 2 sprays into both nostrils daily. USE 2 SPRAYS NASALLY DAILY)   guaifenesin (HUMIBID E) 400 MG TABS tablet Take 400 mg by mouth in the morning and at bedtime.   hydrocortisone 1 % lotion Apply 1 application topically as needed for itching.   ipratropium-albuterol (DUONEB) 0.5-2.5 (3) MG/3ML SOLN Take 3 mLs by nebulization 3 (three) times daily.   levocetirizine (XYZAL) 5 MG tablet Take 5 mg by mouth every evening.   loperamide (IMODIUM) 2 MG capsule Take 2 mg by mouth as needed for diarrhea or loose stools.   magnesium hydroxide (MILK OF MAGNESIA) 400 MG/5ML suspension Take by mouth daily as needed for mild constipation. 2 tbsp   Mouthwashes (MOUTHWASH/GARGLE MT) Use as directed 5 mLs in the mouth or throat in the morning, at noon, in the evening, and at bedtime. 1 tsp by mouth as needed for sore mouth and tongue   Multiple Vitamin (MULTIVITAMIN WITH MINERALS) TABS tablet Take 1 tablet by mouth daily.   nystatin (MYCOSTATIN/NYSTOP) powder Apply 1 application topically 2 (two) times daily as needed.   OXYGEN Inhale 2 L into the lungs continuous as needed.   pantoprazole (PROTONIX) 20 MG tablet Take 1 tablet (20  mg total) by mouth 2 (two) times daily.   SYNTHROID 50 MCG tablet TAKE 1 TABLET DAILY (Patient taking differently: Take 50 mcg by mouth daily.)   triamcinolone (KENALOG) 0.1 %    venlafaxine XR (EFFEXOR-XR) 75 MG 24 hr capsule Take 1 capsule (75 mg total) by mouth daily with breakfast. TAKE 1 CAPSULE DAILY WITH BREAKFAST   [DISCONTINUED] SPIRIVA HANDIHALER 18 MCG  inhalation capsule PLACE 1 CAPSULE INTO INHALER AND INHALE DAILY (Patient taking differently: Place 18 mcg into inhaler and inhale at bedtime.)   furosemide (LASIX) 20 MG tablet Take 1 tablet (20 mg total) by mouth daily for 5 days.   [DISCONTINUED] predniSONE (DELTASONE) 20 MG tablet Take 3 tablets once daily for 3 days followed by 2 tablets once daily for 3 days followed by 1 tablet once daily for 3 days and then stop (Patient not taking: No sig reported)   No facility-administered medications prior to visit.    Review of Systems  Constitutional:  Negative for activity change, chills, fatigue and fever.  HENT:  Negative for ear pain, sinus pressure, sinus pain and sore throat.   Eyes:  Negative for pain and visual disturbance.  Respiratory:  Positive for cough and wheezing. Negative for chest tightness and shortness of breath.   Cardiovascular:  Negative for chest pain, palpitations and leg swelling.  Gastrointestinal:  Negative for abdominal pain, blood in stool, diarrhea, nausea and vomiting.  Genitourinary:  Negative for dysuria, flank pain, frequency, pelvic pain and urgency.  Musculoskeletal:  Positive for myalgias. Negative for back pain and neck pain.  Neurological:  Negative for dizziness, weakness, light-headedness, numbness and headaches.       Objective    BP (!) 150/86 (BP Location: Left Arm, Patient Position: Sitting, Cuff Size: Normal)   Pulse 98   Temp 98 F (36.7 C) (Oral)   Resp 16   Wt 100 lb 14.4 oz (45.8 kg)   SpO2 99%   BMI 19.71 kg/m  BP Readings from Last 3 Encounters:  09/18/20 (!) 150/86  07/18/20 125/87  06/20/20 (!) 169/82   Wt Readings from Last 3 Encounters:  09/18/20 100 lb 14.4 oz (45.8 kg)  06/20/20 109 lb (49.4 kg)  06/08/20 102 lb (46.3 kg)      Physical Exam Vitals reviewed.  Constitutional:      General: She is not in acute distress.    Appearance: Normal appearance. She is well-developed. She is not diaphoretic.  HENT:     Head:  Normocephalic and atraumatic.  Eyes:     General: No scleral icterus.    Conjunctiva/sclera: Conjunctivae normal.  Neck:     Thyroid: No thyromegaly.  Cardiovascular:     Rate and Rhythm: Normal rate and regular rhythm.     Pulses: Normal pulses.     Heart sounds: Normal heart sounds. No murmur heard. Pulmonary:     Effort: Pulmonary effort is normal. No respiratory distress.     Breath sounds: Wheezing (faint) present. No rales.     Comments: Coarse breath sounds throughout Musculoskeletal:     Cervical back: Neck supple.     Right lower leg: No edema.     Left lower leg: No edema.  Lymphadenopathy:     Cervical: No cervical adenopathy.  Skin:    General: Skin is warm and dry.     Findings: No rash.  Neurological:     Mental Status: She is alert and oriented to person, place, and time. Mental status is at baseline.  Psychiatric:        Mood and Affect: Mood normal.        Behavior: Behavior normal.      No results found for any visits on 09/18/20.  Assessment & Plan     Problem List Items Addressed This Visit       Respiratory   COPD (chronic obstructive pulmonary disease) ([place])    Chronic and uncontrolled  Having to use duonebs 2-3 times daily Will change spiriva to trelegy Continue O2 therapy as above      Relevant Medications   Fluticasone-Umeclidin-Vilant (TRELEGY ELLIPTA) 100-62.5-25 MCG/INH AEPB   Chronic hypoxemic respiratory failure (HCC)    Continue continuous O2 therapy        Endocrine   Hypothyroidism - Primary    Previously well controlled Continue Synthroid at current dose  Recheck TSH and adjust Synthroid as indicated        Relevant Orders   TSH     Genitourinary   CKD (chronic kidney disease) stage 3, GFR 30-59 ml/min (HCC)    Continue to monitor Cr        Other   MDD (major depressive disorder), recurrent, in full remission ([place])    Continue effexor Well controlled        Return in about 3 months (around 12/18/2020) for  chronic disease f/u.      I,Essence Turner,acting as a Education administrator for Lavon Paganini, MD.,have documented all relevant documentation on the behalf of Lavon Paganini, MD,as directed by  Lavon Paganini, MD while in the presence of Lavon Paganini, MD.  I, Lavon Paganini, MD, have reviewed all documentation for this visit. The documentation on 09/18/20 for the exam, diagnosis, procedures, and orders are all accurate and complete.   Tayna Smethurst, Dionne Bucy, MD, MPH [facility]

## 2020-09-19 ENCOUNTER — Inpatient Hospital Stay: Payer: Medicare Other | Attending: Oncology

## 2020-09-19 ENCOUNTER — Inpatient Hospital Stay (HOSPITAL_BASED_OUTPATIENT_CLINIC_OR_DEPARTMENT_OTHER): Payer: Medicare Other | Admitting: Oncology

## 2020-09-19 ENCOUNTER — Encounter: Payer: Self-pay | Admitting: Oncology

## 2020-09-19 ENCOUNTER — Inpatient Hospital Stay: Payer: Medicare Other

## 2020-09-19 VITALS — BP 152/73 | HR 104 | Temp 97.4°F | Resp 18 | Wt 100.0 lb

## 2020-09-19 VITALS — BP 158/79 | HR 99 | Resp 18

## 2020-09-19 DIAGNOSIS — Z881 Allergy status to other antibiotic agents status: Secondary | ICD-10-CM | POA: Insufficient documentation

## 2020-09-19 DIAGNOSIS — K219 Gastro-esophageal reflux disease without esophagitis: Secondary | ICD-10-CM | POA: Diagnosis not present

## 2020-09-19 DIAGNOSIS — Z886 Allergy status to analgesic agent status: Secondary | ICD-10-CM | POA: Diagnosis not present

## 2020-09-19 DIAGNOSIS — Z888 Allergy status to other drugs, medicaments and biological substances status: Secondary | ICD-10-CM | POA: Insufficient documentation

## 2020-09-19 DIAGNOSIS — J449 Chronic obstructive pulmonary disease, unspecified: Secondary | ICD-10-CM | POA: Insufficient documentation

## 2020-09-19 DIAGNOSIS — D509 Iron deficiency anemia, unspecified: Secondary | ICD-10-CM | POA: Diagnosis not present

## 2020-09-19 DIAGNOSIS — Z8249 Family history of ischemic heart disease and other diseases of the circulatory system: Secondary | ICD-10-CM | POA: Insufficient documentation

## 2020-09-19 DIAGNOSIS — Z823 Family history of stroke: Secondary | ICD-10-CM | POA: Insufficient documentation

## 2020-09-19 DIAGNOSIS — E039 Hypothyroidism, unspecified: Secondary | ICD-10-CM | POA: Insufficient documentation

## 2020-09-19 DIAGNOSIS — R531 Weakness: Secondary | ICD-10-CM | POA: Insufficient documentation

## 2020-09-19 DIAGNOSIS — Z880 Allergy status to penicillin: Secondary | ICD-10-CM | POA: Diagnosis not present

## 2020-09-19 DIAGNOSIS — Z882 Allergy status to sulfonamides status: Secondary | ICD-10-CM | POA: Insufficient documentation

## 2020-09-19 DIAGNOSIS — Z79899 Other long term (current) drug therapy: Secondary | ICD-10-CM | POA: Diagnosis not present

## 2020-09-19 DIAGNOSIS — Z9049 Acquired absence of other specified parts of digestive tract: Secondary | ICD-10-CM | POA: Insufficient documentation

## 2020-09-19 DIAGNOSIS — N182 Chronic kidney disease, stage 2 (mild): Secondary | ICD-10-CM

## 2020-09-19 LAB — CBC WITH DIFFERENTIAL/PLATELET
Abs Immature Granulocytes: 0.02 10*3/uL (ref 0.00–0.07)
Basophils Absolute: 0.1 10*3/uL (ref 0.0–0.1)
Basophils Relative: 1 %
Eosinophils Absolute: 0.4 10*3/uL (ref 0.0–0.5)
Eosinophils Relative: 6 %
HCT: 28.9 % — ABNORMAL LOW (ref 36.0–46.0)
Hemoglobin: 8.5 g/dL — ABNORMAL LOW (ref 12.0–15.0)
Immature Granulocytes: 0 %
Lymphocytes Relative: 14 %
Lymphs Abs: 1 10*3/uL (ref 0.7–4.0)
MCH: 27 pg (ref 26.0–34.0)
MCHC: 29.4 g/dL — ABNORMAL LOW (ref 30.0–36.0)
MCV: 91.7 fL (ref 80.0–100.0)
Monocytes Absolute: 0.5 10*3/uL (ref 0.1–1.0)
Monocytes Relative: 7 %
Neutro Abs: 5.1 10*3/uL (ref 1.7–7.7)
Neutrophils Relative %: 72 %
Platelets: 454 10*3/uL — ABNORMAL HIGH (ref 150–400)
RBC: 3.15 MIL/uL — ABNORMAL LOW (ref 3.87–5.11)
RDW: 15.7 % — ABNORMAL HIGH (ref 11.5–15.5)
WBC: 7.1 10*3/uL (ref 4.0–10.5)
nRBC: 0 % (ref 0.0–0.2)

## 2020-09-19 LAB — COMPREHENSIVE METABOLIC PANEL
ALT: 14 U/L (ref 0–44)
AST: 24 U/L (ref 15–41)
Albumin: 3 g/dL — ABNORMAL LOW (ref 3.5–5.0)
Alkaline Phosphatase: 70 U/L (ref 38–126)
Anion gap: 6 (ref 5–15)
BUN: 14 mg/dL (ref 8–23)
CO2: 32 mmol/L (ref 22–32)
Calcium: 8.8 mg/dL — ABNORMAL LOW (ref 8.9–10.3)
Chloride: 103 mmol/L (ref 98–111)
Creatinine, Ser: 0.66 mg/dL (ref 0.44–1.00)
GFR, Estimated: >60 mL/min (ref >60)
Glucose, Bld: 133 mg/dL — ABNORMAL HIGH (ref 70–99)
Potassium: 4 mmol/L (ref 3.5–5.1)
Sodium: 141 mmol/L (ref 135–145)
Total Bilirubin: 0.3 mg/dL (ref 0.3–1.2)
Total Protein: 6.9 g/dL (ref 6.5–8.1)

## 2020-09-19 LAB — IRON AND TIBC
Iron: 17 ug/dL — ABNORMAL LOW (ref 28–170)
Saturation Ratios: 7 % — ABNORMAL LOW (ref 10.4–31.8)
TIBC: 251 ug/dL (ref 250–450)
UIBC: 234 ug/dL

## 2020-09-19 LAB — TSH: TSH: 3.066 u[IU]/mL (ref 0.350–4.500)

## 2020-09-19 LAB — FERRITIN: Ferritin: 60 ng/mL (ref 11–307)

## 2020-09-19 MED ORDER — SODIUM CHLORIDE 0.9 % IV SOLN
Freq: Once | INTRAVENOUS | Status: AC
  Filled 2020-09-19: qty 250

## 2020-09-19 MED ORDER — ACETAMINOPHEN 325 MG PO TABS
325.0000 mg | ORAL_TABLET | Freq: Once | ORAL | Status: AC
  Administered 2020-09-19: 325 mg via ORAL
  Filled 2020-09-19: qty 1

## 2020-09-19 MED ORDER — SODIUM CHLORIDE 0.9 % IV SOLN
510.0000 mg | Freq: Once | INTRAVENOUS | Status: AC
  Administered 2020-09-19: 510 mg via INTRAVENOUS
  Filled 2020-09-19: qty 17

## 2020-09-19 NOTE — Progress Notes (Signed)
Sandra Brown tolerated her Feraheme infusion today without any complications. Vital signs stable at discharge.

## 2020-09-19 NOTE — Progress Notes (Signed)
Patient denies new problems/concerns today.   °

## 2020-09-28 ENCOUNTER — Telehealth: Payer: Self-pay

## 2020-09-28 ENCOUNTER — Emergency Department
Admission: EM | Admit: 2020-09-28 | Discharge: 2020-09-29 | Disposition: A | Discharge status: Home / Self Care | Payer: Medicare Other | Attending: Emergency Medicine | Admitting: Emergency Medicine

## 2020-09-28 ENCOUNTER — Emergency Department: Payer: Medicare Other

## 2020-09-28 ENCOUNTER — Other Ambulatory Visit: Payer: Self-pay

## 2020-09-28 DIAGNOSIS — K5641 Fecal impaction: Secondary | ICD-10-CM | POA: Diagnosis not present

## 2020-09-28 DIAGNOSIS — N183 Chronic kidney disease, stage 3 unspecified: Secondary | ICD-10-CM | POA: Insufficient documentation

## 2020-09-28 DIAGNOSIS — Z79899 Other long term (current) drug therapy: Secondary | ICD-10-CM | POA: Diagnosis not present

## 2020-09-28 DIAGNOSIS — E039 Hypothyroidism, unspecified: Secondary | ICD-10-CM | POA: Diagnosis not present

## 2020-09-28 DIAGNOSIS — Z96649 Presence of unspecified artificial hip joint: Secondary | ICD-10-CM | POA: Insufficient documentation

## 2020-09-28 DIAGNOSIS — Z91040 Latex allergy status: Secondary | ICD-10-CM | POA: Diagnosis not present

## 2020-09-28 DIAGNOSIS — J449 Chronic obstructive pulmonary disease, unspecified: Secondary | ICD-10-CM | POA: Diagnosis not present

## 2020-09-28 DIAGNOSIS — I129 Hypertensive chronic kidney disease with stage 1 through stage 4 chronic kidney disease, or unspecified chronic kidney disease: Secondary | ICD-10-CM | POA: Insufficient documentation

## 2020-09-28 DIAGNOSIS — Z7952 Long term (current) use of systemic steroids: Secondary | ICD-10-CM | POA: Diagnosis not present

## 2020-09-28 DIAGNOSIS — K5900 Constipation, unspecified: Secondary | ICD-10-CM | POA: Diagnosis not present

## 2020-09-28 DIAGNOSIS — K5909 Other constipation: Secondary | ICD-10-CM

## 2020-09-28 LAB — COMPREHENSIVE METABOLIC PANEL
ALT: 17 U/L (ref 0–44)
AST: 27 U/L (ref 15–41)
Albumin: 3.4 g/dL — ABNORMAL LOW (ref 3.5–5.0)
Alkaline Phosphatase: 77 U/L (ref 38–126)
Anion gap: 10 (ref 5–15)
BUN: 12 mg/dL (ref 8–23)
CO2: 29 mmol/L (ref 22–32)
Calcium: 9.4 mg/dL (ref 8.9–10.3)
Chloride: 100 mmol/L (ref 98–111)
Creatinine, Ser: 0.72 mg/dL (ref 0.44–1.00)
GFR, Estimated: >60 mL/min (ref >60)
Glucose, Bld: 102 mg/dL — ABNORMAL HIGH (ref 70–99)
Potassium: 4.6 mmol/L (ref 3.5–5.1)
Sodium: 139 mmol/L (ref 135–145)
Total Bilirubin: 0.7 mg/dL (ref 0.3–1.2)
Total Protein: 8 g/dL (ref 6.5–8.1)

## 2020-09-28 LAB — CBC WITH DIFFERENTIAL/PLATELET
Abs Immature Granulocytes: 0.06 10*3/uL (ref 0.00–0.07)
Basophils Absolute: 0 10*3/uL (ref 0.0–0.1)
Basophils Relative: 0 %
Eosinophils Absolute: 0.1 10*3/uL (ref 0.0–0.5)
Eosinophils Relative: 1 %
HCT: 33.5 % — ABNORMAL LOW (ref 36.0–46.0)
Hemoglobin: 10.4 g/dL — ABNORMAL LOW (ref 12.0–15.0)
Immature Granulocytes: 1 %
Lymphocytes Relative: 9 %
Lymphs Abs: 1 10*3/uL (ref 0.7–4.0)
MCH: 28 pg (ref 26.0–34.0)
MCHC: 31 g/dL (ref 30.0–36.0)
MCV: 90.3 fL (ref 80.0–100.0)
Monocytes Absolute: 0.5 10*3/uL (ref 0.1–1.0)
Monocytes Relative: 5 %
Neutro Abs: 9.6 10*3/uL — ABNORMAL HIGH (ref 1.7–7.7)
Neutrophils Relative %: 84 %
Platelets: 459 10*3/uL — ABNORMAL HIGH (ref 150–400)
RBC: 3.71 MIL/uL — ABNORMAL LOW (ref 3.87–5.11)
RDW: 17.9 % — ABNORMAL HIGH (ref 11.5–15.5)
WBC: 11.2 10*3/uL — ABNORMAL HIGH (ref 4.0–10.5)
nRBC: 0 % (ref 0.0–0.2)

## 2020-09-28 LAB — URINALYSIS, COMPLETE (UACMP) WITH MICROSCOPIC
Bacteria, UA: NONE SEEN
Bilirubin Urine: NEGATIVE
Glucose, UA: NEGATIVE mg/dL
Ketones, ur: NEGATIVE mg/dL
Nitrite: NEGATIVE
Protein, ur: NEGATIVE mg/dL
Specific Gravity, Urine: 1.016 (ref 1.005–1.030)
pH: 6 (ref 5.0–8.0)

## 2020-09-28 LAB — LIPASE, BLOOD: Lipase: 22 U/L (ref 11–51)

## 2020-09-28 NOTE — ED Provider Notes (Signed)
[facility] Emergency Department Provider Note ____________________________________________   Event Date/Time   First MD Initiated Contact with Patient 09/28/20 2044     (approximate)  I have reviewed the triage vital signs and the nursing notes.  HISTORY  Chief Complaint Abdominal Pain and Fecal Impaction   HPI Sandra Brown is a 87 y.o. femalewho presents to the ED for evaluation of constipation  Chart review indicates IDA, copd, htn.   Patient presents to the ED via EMS from her local SNF for the evaluation of abdominal pain, constipation and possible stool impaction.  She presents with her daughter, who provides some supplemental history.  They report that she has not passed a bowel movement in 3-4 days.  They report that a nurse at her facility performed a limited examination and is concerned about a hard stool ball.   Patient reports mild generalized abdominal discomfort without focal features.  Denies emesis, fever, dysuria or diarrhea.  Past Medical History:  Diagnosis Date   Cataract    COPD (chronic obstructive pulmonary disease) ([place])    Depression    Difficulty swallowing    Frequent headaches    Hearing loss    Hypertension    Hypothyroidism    Reflux     Patient Active Problem List   Diagnosis Date Noted   Mild neurocognitive disorder due to multiple etiologies 08/04/2020   Malnutrition of moderate degree 06/01/2020   Chronic hypoxemic respiratory failure ([place]) 05/30/2020   MDD (major depressive disorder), recurrent, in full remission ([place]) 11/11/2019   Panic attacks 03/16/2019   Other insomnia 09/30/2018   Skin-picking disorder 09/30/2018   Lymphedema 01/27/2018   Chronic venous insufficiency 01/27/2018   Swelling of limb 12/23/2017   CKD (chronic kidney disease) stage 3, GFR 30-59 ml/min (HCC) 10/02/2015   Hypothyroidism 10/02/2015   IBS (irritable bowel syndrome) 12/15/2014   Allergic rhinitis 11/14/2014   Facet  syndrome, lumbar 09/21/2014   Sacroiliac joint dysfunction 09/21/2014   Hyperlipemia 09/01/2014   Iron deficiency anemia 07/14/2014   GERD (gastroesophageal reflux disease) 07/14/2014   COPD (chronic obstructive pulmonary disease) ([place]) 06/24/2014   Incomplete bladder emptying 12/09/2012   Urge incontinence 12/09/2012    Past Surgical History:  Procedure Laterality Date   ABDOMINAL HYSTERECTOMY     APPENDECTOMY     LUMBAR LAMINECTOMY     PARATHYROIDECTOMY     TOTAL HIP ARTHROPLASTY     x 4    Prior to Admission medications   Medication Sig Start Date End Date Taking? Authorizing Provider  acetaminophen (TYLENOL) 650 MG CR tablet Take 650 mg by mouth every 8 (eight) hours as needed for pain.    [provider]  AMBULATORY NON FORMULARY MEDICATION Medication Name: incentive spirometry Use as directed 03/03/15   Vilinda Boehringer, MD  Dextromethorphan-guaiFENesin (DELSYM COUGH/CHEST CONGEST DM) 5-100 MG/5ML LIQD Take 5 mLs by mouth every 12 (twelve) hours as needed (cough).    [provider]  feeding supplement, GLUCERNA SHAKE, (GLUCERNA SHAKE) LIQD Take 237 mLs by mouth 3 (three) times daily between meals. 06/02/20   Bonnielee Haff, MD  fluticasone (FLONASE) 50 MCG/ACT nasal spray USE 2 SPRAYS NASALLY DAILY Patient taking differently: Place 2 sprays into both nostrils daily. USE 2 SPRAYS NASALLY DAILY 01/23/20   Trinna Post, PA-C  Fluticasone-Umeclidin-Vilant (TRELEGY ELLIPTA) 100-62.5-25 MCG/INH AEPB Inhale 1 puff into the lungs daily. 09/18/20   Virginia Crews, MD  furosemide (LASIX) 20 MG tablet Take 1 tablet (20 mg total) by  mouth daily for 5 days. 06/03/20 06/20/20  Bonnielee Haff, MD  guaifenesin (HUMIBID E) 400 MG TABS tablet Take 400 mg by mouth in the morning and at bedtime.    [provider]  hydrocortisone 1 % lotion Apply 1 application topically as needed for itching.    [provider]  ipratropium-albuterol (DUONEB) 0.5-2.5 (3)  MG/3ML SOLN Take 3 mLs by nebulization 3 (three) times daily. 05/15/20   [provider]  levocetirizine (XYZAL) 5 MG tablet Take 5 mg by mouth every evening.    [provider]  loperamide (IMODIUM) 2 MG capsule Take 2 mg by mouth as needed for diarrhea or loose stools.    [provider]  magnesium hydroxide (MILK OF MAGNESIA) 400 MG/5ML suspension Take by mouth daily as needed for mild constipation. 2 tbsp    [provider]  Mouthwashes (MOUTHWASH/GARGLE MT) Use as directed 5 mLs in the mouth or throat in the morning, at noon, in the evening, and at bedtime. 1 tsp by mouth as needed for sore mouth and tongue    [provider]  Multiple Vitamin (MULTIVITAMIN WITH MINERALS) TABS tablet Take 1 tablet by mouth daily. 06/02/20   Bonnielee Haff, MD  nystatin (MYCOSTATIN/NYSTOP) powder Apply 1 application topically 2 (two) times daily as needed.    [provider]  OXYGEN Inhale 2 L into the lungs continuous as needed.    [provider]  pantoprazole (PROTONIX) 20 MG tablet Take 1 tablet (20 mg total) by mouth 2 (two) times daily. 05/01/20 10/28/20  Virginia Crews, MD  SYNTHROID 50 MCG tablet TAKE 1 TABLET DAILY Patient taking differently: Take 50 mcg by mouth daily. 01/24/20   Trinna Post, PA-C  triamcinolone (KENALOG) 0.1 %  02/28/19   [provider]  venlafaxine XR (EFFEXOR-XR) 75 MG 24 hr capsule Take 1 capsule (75 mg total) by mouth daily with breakfast. TAKE 1 CAPSULE DAILY WITH BREAKFAST 08/08/20   Ursula Alert, MD    Allergies Sulfa antibiotics, Erythromycin, Aspirin, Contrast media [iodinated diagnostic agents], Latex, Penicillin g, Penicillins, and Tape  Family History  Problem Relation Age of Onset   Stroke Mother    Hypertension Mother    Heart disease Father    Hypertension Father     Social History Social History   Tobacco Use   Smoking status: Never   Smokeless tobacco: Never   Tobacco  comments:    quit 1954  Vaping Use   Vaping Use: Never used  Substance Use Topics   Alcohol use: No   Drug use: No    Review of Systems  Constitutional: No fever/chills Eyes: No visual changes. ENT: No sore throat. Cardiovascular: Denies chest pain. Respiratory: Denies shortness of breath. Gastrointestinal: No nausea, no vomiting.  No diarrhea.  Positive for abdominal pain and constipation. Genitourinary: Negative for dysuria. Musculoskeletal: Negative for back pain. Skin: Negative for rash. Neurological: Negative for headaches, focal weakness or numbness.  ____________________________________________   PHYSICAL EXAM:  VITAL SIGNS: Vitals:   09/28/20 1653 09/28/20 2047  BP: (!) 156/83 (!) 166/89  Pulse: 99 (!) 102  Resp: 19 19  Temp: 98 F (36.7 C)   SpO2: 100% 100%     Constitutional: Alert and oriented. Well appearing and in no acute distress. Eyes: Conjunctivae are normal. PERRL. EOMI. Head: Atraumatic. Nose: No congestion/rhinnorhea. Mouth/Throat: Mucous membranes are moist.  Oropharynx non-erythematous. Neck: No stridor. No cervical spine tenderness to palpation. Cardiovascular: Normal rate, regular rhythm. Grossly normal heart sounds.  Good peripheral circulation. Respiratory: Normal respiratory effort.  No retractions. Lungs CTAB. Gastrointestinal: Soft , nondistended. No CVA tenderness.  Minimal diffuse tenderness without peritoneal or localizing features. Rectal examination: Normal-appearing external anatomy without anal fissures, external hemorrhoids or bleeding pathology. Hard stool ball is disimpacted by me, as below. Musculoskeletal: No lower extremity tenderness nor edema.  No joint effusions. No signs of acute trauma. Neurologic:  Normal speech and language. No gross focal neurologic deficits are appreciated. No gait instability noted. Skin:  Skin is warm, dry and intact. No rash noted. Psychiatric: Mood and affect are normal. Speech and behavior  are normal. ____________________________________________   LABS (all labs ordered are listed, but only abnormal results are displayed)  Labs Reviewed  CBC WITH DIFFERENTIAL/PLATELET - Abnormal; Notable for the following components:      Result Value   WBC 11.2 (*)    RBC 3.71 (*)    Hemoglobin 10.4 (*)    HCT 33.5 (*)    RDW 17.9 (*)    Platelets 459 (*)    Neutro Abs 9.6 (*)    All other components within normal limits  COMPREHENSIVE METABOLIC PANEL - Abnormal; Notable for the following components:   Glucose, Bld 102 (*)    Albumin 3.4 (*)    All other components within normal limits  URINALYSIS, COMPLETE (UACMP) WITH MICROSCOPIC - Abnormal; Notable for the following components:   Color, Urine YELLOW (*)    APPearance HAZY (*)    Hgb urine dipstick SMALL (*)    Leukocytes,Ua SMALL (*)    All other components within normal limits  LIPASE, BLOOD   ____________________________________________  12 Lead EKG   ____________________________________________  RADIOLOGY  ED MD interpretation:   kub w nonobstructive bowel gas  Official radiology report(s): DG Abdomen 1 View  Result Date: 09/28/2020 CLINICAL DATA:  Constipation. EXAM: ABDOMEN-1 VIEW COMPARISON:  None. FINDINGS: The bowel gas pattern is normal. No excessive stool burden. No radio-opaque calculi or other significant radiographic abnormality are seen. No acute osseous abnormality. IMPRESSION: 1. Negative. Electronically Signed   By: Titus Dubin M.D.   On: 09/28/2020 18:11    ____________________________________________   PROCEDURES and INTERVENTIONS  Procedure(s) performed (including Critical Care):  Fecal disimpaction  Date/Time: 09/28/2020 10:42 PM Performed by: Vladimir Crofts, MD Authorized by: Vladimir Crofts, MD  Consent: Verbal consent obtained. Risks and benefits: risks, benefits and alternatives were discussed Consent given by: guardian Local anesthesia used: no  Anesthesia: Local anesthesia  used: no  Sedation: Patient sedated: no  Patient tolerance: patient tolerated the procedure well with no immediate complications    Medications - No data to display  ____________________________________________   MDM / ED COURSE   87 year old woman presents to the ED constipated requiring fecal disimpaction and return to facility.  Minimal tachycardia to 102 was noted in triage, so blood work and urinalysis were obtained as well without concerning features.  Minimal leukocytosis is noted, but she has no further evidence of sepsis and I doubt infectious pathology.  Urine without infectious features considering her lack of symptoms.  Lipase normal.  KUB without obstruction or perforation signs.  Disimpacted with improving symptoms and resolution of her abdominal tenderness.  Will discharge with recommendations for MiraLAX at her facility and return precautions for the ED.  Clinical Course as of 09/28/20 2240  Thu Sep 28, 2020  2135 Disimpacted  [DS]    Clinical Course User Index [DS] Vladimir Crofts, MD    ____________________________________________   FINAL CLINICAL IMPRESSION(S) / ED  DIAGNOSES  Final diagnoses:  Other constipation  Fecal impaction [facility])     ED Discharge Orders     None        Aniyah Nobis   Note:  This document was prepared using Dragon voice recognition software and may include unintentional dictation errors.    Vladimir Crofts, MD 09/28/20 [id number]

## 2020-09-28 NOTE — ED Notes (Signed)
First nurse-pt brought in via ems from [facility] with constipation,  pt alert, dnr with pt, pt on 2 liters [place] prn per ems.  166/77, p-100, o2 sats 100% on 2 liters per ems.  Pt in wheelchair in lobby

## 2020-09-28 NOTE — ED Triage Notes (Signed)
Pt presents to ED via EMS from [facility] with c/o of constipation minor ABD pain. Daughter states pt has a fecal impaction and states [facility] tried multiple medications in order to get bowels moving but states there was no stool and they sent her for fecal impaction. Pt presents wearing 2L/via [place] chronically.

## 2020-09-28 NOTE — ED Notes (Signed)
Pt given cup of coke

## 2020-09-28 NOTE — Discharge Instructions (Signed)
Use MiraLAX 1-2 times per day for her constipation.  You may use suppositories as well with this.

## 2020-09-29 ENCOUNTER — Encounter: Payer: Self-pay | Admitting: Family Medicine

## 2020-09-29 ENCOUNTER — Inpatient Hospital Stay: Payer: Medicare Other

## 2020-09-29 VITALS — BP 148/71 | HR 91 | Temp 98.0°F | Resp 18

## 2020-09-29 DIAGNOSIS — E039 Hypothyroidism, unspecified: Secondary | ICD-10-CM | POA: Diagnosis not present

## 2020-09-29 DIAGNOSIS — Z23 Encounter for immunization: Secondary | ICD-10-CM | POA: Diagnosis not present

## 2020-09-29 DIAGNOSIS — D509 Iron deficiency anemia, unspecified: Secondary | ICD-10-CM

## 2020-09-29 DIAGNOSIS — Z7401 Bed confinement status: Secondary | ICD-10-CM | POA: Diagnosis not present

## 2020-09-29 DIAGNOSIS — R531 Weakness: Secondary | ICD-10-CM | POA: Diagnosis not present

## 2020-09-29 DIAGNOSIS — K219 Gastro-esophageal reflux disease without esophagitis: Secondary | ICD-10-CM | POA: Diagnosis not present

## 2020-09-29 DIAGNOSIS — K5900 Constipation, unspecified: Secondary | ICD-10-CM | POA: Diagnosis not present

## 2020-09-29 DIAGNOSIS — Z79899 Other long term (current) drug therapy: Secondary | ICD-10-CM | POA: Diagnosis not present

## 2020-09-29 DIAGNOSIS — J449 Chronic obstructive pulmonary disease, unspecified: Secondary | ICD-10-CM | POA: Diagnosis not present

## 2020-09-29 MED ORDER — SODIUM CHLORIDE 0.9 % IV SOLN
INTRAVENOUS | Status: DC | PRN
  Administered 2020-09-29: 250 mL via INTRAVENOUS
  Filled 2020-09-29: qty 250

## 2020-09-29 MED ORDER — SODIUM CHLORIDE 0.9 % IV SOLN
510.0000 mg | Freq: Once | INTRAVENOUS | Status: AC
  Administered 2020-09-29: 510 mg via INTRAVENOUS
  Filled 2020-09-29: qty 17

## 2020-10-03 NOTE — Telephone Encounter (Signed)
contacted patient to make sure she was consuming a diet rich in iron. Spoke with her daughter gretchen and she informed me her mother received an infusion on 09/19/20 at the [facility] center with Dr.Finnegan and will have her second one done tomorrow.

## 2020-10-09 ENCOUNTER — Other Ambulatory Visit: Payer: Self-pay | Admitting: Family Medicine

## 2020-10-09 ENCOUNTER — Telehealth: Payer: Self-pay

## 2020-10-09 DIAGNOSIS — R600 Localized edema: Secondary | ICD-10-CM

## 2020-10-09 NOTE — Telephone Encounter (Signed)
Copied from [place] [id number]. Topic: General - Other >> Oct 09, 2020 11:07 AM Pawlus, Brayton Layman A wrote: Reason for CRM: [facility] was calling to report that the pt has right ankle swelling, caller wanted to know if an order for an Xray can be placed. Caller also wanted an order for Stockings. Please fax orders [phone] or call [phone]

## 2020-10-09 NOTE — Telephone Encounter (Signed)
Please advise X-ray?

## 2020-10-10 DIAGNOSIS — M25571 Pain in right ankle and joints of right foot: Secondary | ICD-10-CM | POA: Diagnosis not present

## 2020-10-11 ENCOUNTER — Telehealth: Payer: Self-pay

## 2020-10-11 NOTE — Telephone Encounter (Signed)
Copied from [place] [phone]. Topic: General - Other >> Oct 11, 2020  1:00 PM Tessa Lerner A wrote: Reason for CRM: Danae Chen with [facility] has requested contact from the patient's PCP when possible  An xray performed on 10/10/20 determined that patient fractured their right ankle  Results came back to [facility] yesterday 10/10/20 around 10:30 PM  The patient is also requesting a diet changed from minced to regular   Please contact further when possible

## 2020-10-11 NOTE — Telephone Encounter (Signed)
Patients daughter, gretchen called wanting status on requests, An xray performed on 10/10/20 determined that patient fractured their right ankle   Results came back to [facility] yesterday 10/10/20 around 10:30 PM   The patient is also requesting a diet changed from minced to regular . Please call back. Daugher is requesting call back to nurse at facility ([facility])as to what to do aboout broken foot.

## 2020-10-11 NOTE — Telephone Encounter (Signed)
Please review. KW 

## 2020-10-12 NOTE — Telephone Encounter (Signed)
Spoke with [facility] she states that they received email from a provider and diet has already been changed. Patients nurse states that paitent will be seeing Daylene Katayama at [facility]. KW

## 2020-10-13 ENCOUNTER — Encounter: Payer: Self-pay | Admitting: Podiatry

## 2020-10-13 ENCOUNTER — Telehealth: Payer: Self-pay | Admitting: Family Medicine

## 2020-10-13 ENCOUNTER — Other Ambulatory Visit: Payer: Self-pay

## 2020-10-13 ENCOUNTER — Ambulatory Visit (INDEPENDENT_AMBULATORY_CARE_PROVIDER_SITE_OTHER): Payer: Medicare Other | Admitting: Podiatry

## 2020-10-13 DIAGNOSIS — B351 Tinea unguium: Secondary | ICD-10-CM | POA: Diagnosis not present

## 2020-10-13 DIAGNOSIS — M79674 Pain in right toe(s): Secondary | ICD-10-CM | POA: Diagnosis not present

## 2020-10-13 DIAGNOSIS — M79675 Pain in left toe(s): Secondary | ICD-10-CM

## 2020-10-13 NOTE — Telephone Encounter (Signed)
Danae Chen, from [facility], calling stating that the pts daughter [facility]) is supposed to be wearing ted hose. Danae Chen states that she is needing to have order placed with facility to be able to provide these to pt. Please advise.     [phone]

## 2020-10-16 NOTE — Telephone Encounter (Signed)
Spoke with Sandra Brown on phone who states that they did not see order but patient saw Dr. Daylene Katayama ( podiatry) who wrote order for them. KW

## 2020-10-27 NOTE — Progress Notes (Signed)
Most recent CBC And iron levels after infusion 1 month ago.

## 2020-10-29 NOTE — Progress Notes (Signed)
   SUBJECTIVE Patient presents to office today complaining of elongated, thickened nails that cause pain while ambulating in shoes.  Patient is unable to trim their own nails. Patient is here for further evaluation and treatment.  Past Medical History:  Diagnosis Date   Cataract    COPD (chronic obstructive pulmonary disease) ([place])    Depression    Difficulty swallowing    Frequent headaches    Hearing loss    Hypertension    Hypothyroidism    Reflux     OBJECTIVE General Patient is awake, alert, and oriented x 3 and in no acute distress. Derm Skin is dry and supple bilateral. Negative open lesions or macerations. Remaining integument unremarkable. Nails are tender, long, thickened and dystrophic with subungual debris, consistent with onychomycosis, 1-5 bilateral. No signs of infection noted. Vasc  DP and PT pedal pulses palpable bilaterally. Temperature gradient within normal limits.  Neuro Epicritic and protective threshold sensation grossly intact bilaterally.  Musculoskeletal Exam No symptomatic pedal deformities noted bilateral. Muscular strength within normal limits.  ASSESSMENT 1.  Pain due to onychomycosis of toenails both  PLAN OF CARE 1. Patient evaluated today.  2. Instructed to maintain good pedal hygiene and foot care.  3. Mechanical debridement of nails 1-5 bilaterally performed using a nail nipper. Filed with dremel without incident.  4. Return to clinic in 3 mos.    Edrick Kins, DPM [facility]  Dr. Edrick Kins, DPM    [address], [place] [address]                Office [phone]  Fax [phone]

## 2020-10-30 ENCOUNTER — Encounter: Payer: Self-pay | Admitting: Oncology

## 2020-10-31 ENCOUNTER — Telehealth: Payer: Self-pay

## 2020-10-31 NOTE — Telephone Encounter (Signed)
Patient's daughter advised as below. I have sent a message to Dr. Gary Fleet CMA. Will await for response.

## 2020-10-31 NOTE — Telephone Encounter (Signed)
This was per Heme/Onc (Dr Grayland Ormond) after seeing her last CBC with decreased iron levels again. She can call them for more info.

## 2020-10-31 NOTE — Telephone Encounter (Signed)
Sandra Brown advised to call hem/onco to cancel one appt.

## 2020-10-31 NOTE — Telephone Encounter (Signed)
Copied from [id number]. Topic: General - Inquiry >> Oct 31, 2020 10:20 AM [place] wrote: Pt daughter Len Blalock needs clarification. There was already 1 infusion sch for 12/16, she received a message yesterday to call and sch an infusion for mother which she did, made appt for 10/31. Now as a result she sees (2) infusion appts . FU to clarify, is her mother supposed to have 2 infusions? [phone]

## 2020-11-03 ENCOUNTER — Telehealth: Payer: Self-pay | Admitting: Family Medicine

## 2020-11-03 DIAGNOSIS — R600 Localized edema: Secondary | ICD-10-CM

## 2020-11-03 DIAGNOSIS — J309 Allergic rhinitis, unspecified: Secondary | ICD-10-CM

## 2020-11-03 DIAGNOSIS — E890 Postprocedural hypothyroidism: Secondary | ICD-10-CM

## 2020-11-03 DIAGNOSIS — K219 Gastro-esophageal reflux disease without esophagitis: Secondary | ICD-10-CM

## 2020-11-03 DIAGNOSIS — F3342 Major depressive disorder, recurrent, in full remission: Secondary | ICD-10-CM

## 2020-11-03 NOTE — Telephone Encounter (Signed)
Patients daghter callled in requesting that medications bs sent to  [place], [place] Phone:  [phone]  Fax:  [phone]

## 2020-11-05 NOTE — Telephone Encounter (Signed)
Pt's daughter wants all meds transferred to [facility].   The Trelegy Ellipta  will not transfer.

## 2020-11-06 ENCOUNTER — Other Ambulatory Visit: Payer: Self-pay

## 2020-11-06 ENCOUNTER — Inpatient Hospital Stay: Payer: Medicare Other

## 2020-11-06 DIAGNOSIS — K219 Gastro-esophageal reflux disease without esophagitis: Secondary | ICD-10-CM

## 2020-11-06 DIAGNOSIS — J309 Allergic rhinitis, unspecified: Secondary | ICD-10-CM

## 2020-11-06 DIAGNOSIS — E890 Postprocedural hypothyroidism: Secondary | ICD-10-CM

## 2020-11-06 MED ORDER — FLUTICASONE PROPIONATE 50 MCG/ACT NA SUSP: 2.0000 | Freq: Every day | NASAL | 6 refills | Status: AC

## 2020-11-06 MED ORDER — PANTOPRAZOLE SODIUM 20 MG PO TBEC: 20.0000 mg | DELAYED_RELEASE_TABLET | Freq: Two times a day (BID) | ORAL | 3 refills | Status: AC

## 2020-11-06 MED ORDER — LEVOTHYROXINE SODIUM 50 MCG PO TABS: 50.0000 ug | ORAL_TABLET | Freq: Every day | ORAL | 1 refills | Status: AC

## 2020-11-06 MED ORDER — TRELEGY ELLIPTA 100-62.5-25 MCG/ACT IN AEPB: 1.0000 | INHALATION_SPRAY | Freq: Every day | RESPIRATORY_TRACT | 11 refills | Status: DC

## 2020-11-06 NOTE — Addendum Note (Signed)
Addended by: Shawna Orleans on: 11/06/2020 12:07 PM   Modules accepted: Orders

## 2020-12-11 DIAGNOSIS — H9193 Unspecified hearing loss, bilateral: Secondary | ICD-10-CM | POA: Diagnosis not present

## 2020-12-19 ENCOUNTER — Other Ambulatory Visit: Payer: Self-pay

## 2020-12-19 ENCOUNTER — Ambulatory Visit (INDEPENDENT_AMBULATORY_CARE_PROVIDER_SITE_OTHER): Payer: Medicare Other | Admitting: Family Medicine

## 2020-12-19 ENCOUNTER — Encounter: Payer: Self-pay | Admitting: Family Medicine

## 2020-12-19 VITALS — BP 148/76 | HR 96 | Temp 97.9°F | Wt 106.0 lb

## 2020-12-19 DIAGNOSIS — J449 Chronic obstructive pulmonary disease, unspecified: Secondary | ICD-10-CM

## 2020-12-19 DIAGNOSIS — J9611 Chronic respiratory failure with hypoxia: Secondary | ICD-10-CM

## 2020-12-19 DIAGNOSIS — F3342 Major depressive disorder, recurrent, in full remission: Secondary | ICD-10-CM | POA: Diagnosis not present

## 2020-12-19 NOTE — Progress Notes (Signed)
Established patient visit   [name]   DOB: [birth year]   87 y.o. Female  MRN: [id number] Visit Date: 12/19/2020  Today's healthcare provider: Lavon Paganini, MD   Chief Complaint  Patient presents with   COPD   Subjective     COPD - breathing improved and decreased coughing on Trelegy inhaler  Medications: Outpatient Medications Prior to Visit  Medication Sig   acetaminophen (TYLENOL) 650 MG CR tablet Take 650 mg by mouth every 8 (eight) hours as needed for pain.   AMBULATORY NON FORMULARY MEDICATION Medication Name: incentive spirometry Use as directed   Dextromethorphan-guaiFENesin (DELSYM COUGH/CHEST CONGEST DM) 5-100 MG/5ML LIQD Take 5 mLs by mouth every 12 (twelve) hours as needed (cough).   fluticasone (FLONASE) 50 MCG/ACT nasal spray Place 2 sprays into both nostrils daily. USE 2 SPRAYS NASALLY DAILY   guaifenesin (HUMIBID E) 400 MG TABS tablet Take 400 mg by mouth in the morning and at bedtime.   hydrocortisone 1 % lotion Apply 1 application topically as needed for itching.   ipratropium-albuterol (DUONEB) 0.5-2.5 (3) MG/3ML SOLN Take 3 mLs by nebulization 3 (three) times daily.   levocetirizine (XYZAL) 5 MG tablet Take 5 mg by mouth every evening.   levothyroxine (SYNTHROID) 50 MCG tablet Take 1 tablet (50 mcg total) by mouth daily.   loperamide (IMODIUM) 2 MG capsule Take 2 mg by mouth as needed for diarrhea or loose stools.   magnesium hydroxide (MILK OF MAGNESIA) 400 MG/5ML suspension Take by mouth daily as needed for mild constipation. 2 tbsp   Mouthwashes (MOUTHWASH/GARGLE MT) Use as directed 5 mLs in the mouth or throat in the morning, at noon, in the evening, and at bedtime. 1 tsp by mouth as needed for sore mouth and tongue   Multiple Vitamin (MULTIVITAMIN WITH MINERALS) TABS tablet Take 1 tablet by mouth daily.   nystatin (MYCOSTATIN/NYSTOP) powder Apply 1 application topically 2 (two) times daily as needed.   OXYGEN Inhale 2 L into  the lungs continuous as needed.   pantoprazole (PROTONIX) 20 MG tablet Take 1 tablet (20 mg total) by mouth 2 (two) times daily.   senna (SENOKOT) 8.6 MG TABS tablet Take 1 tablet by mouth daily.   triamcinolone (KENALOG) 0.1 %    venlafaxine XR (EFFEXOR-XR) 75 MG 24 hr capsule Take 1 capsule (75 mg total) by mouth daily with breakfast. TAKE 1 CAPSULE DAILY WITH BREAKFAST   [DISCONTINUED] Fluticasone-Umeclidin-Vilant (TRELEGY ELLIPTA) 100-62.5-25 MCG/ACT AEPB Inhale 1 puff into the lungs daily.   feeding supplement, GLUCERNA SHAKE, (GLUCERNA SHAKE) LIQD Take 237 mLs by mouth 3 (three) times daily between meals.   Fluticasone-Umeclidin-Vilant (TRELEGY ELLIPTA) 100-62.5-25 MCG/INH AEPB Inhale 1 puff into the lungs daily.   furosemide (LASIX) 20 MG tablet Take 1 tablet (20 mg total) by mouth daily for 5 days.   No facility-administered medications prior to visit.    Review of Systems  Constitutional: Negative.   HENT: Negative.    Eyes: Negative.   Respiratory:  Negative for cough, shortness of breath and wheezing.   Cardiovascular:  Negative for chest pain and leg swelling.  Gastrointestinal: Negative.   Endocrine: Negative.   Genitourinary: Negative.   Musculoskeletal: Negative.   Skin: Negative.   Allergic/Immunologic: Negative.   Neurological: Negative.   Psychiatric/Behavioral: Negative.  Negative for dysphoric mood.       Objective    BP (!) 148/76 (BP Location: Left Arm, Patient Position: Sitting, Cuff Size: Normal)    Pulse 96  Temp 97.9 F (36.6 C) (Oral)    Wt 106 lb (48.1 kg)    SpO2 96%    BMI 20.70 kg/m  {Show previous vital signs (optional):23777}  Physical Exam Vitals reviewed.  Constitutional:      General: She is not in acute distress.    Appearance: Normal appearance. She is not ill-appearing or toxic-appearing.  HENT:     Head: Normocephalic and atraumatic.     Right Ear: External ear normal.     Left Ear: External ear normal.     Nose: Nose normal.      Mouth/Throat:     Mouth: Mucous membranes are moist.     Pharynx: Oropharynx is clear. No oropharyngeal exudate or posterior oropharyngeal erythema.  Eyes:     General: No scleral icterus.    Extraocular Movements: Extraocular movements intact.     Conjunctiva/sclera: Conjunctivae normal.     Pupils: Pupils are equal, round, and reactive to light.  Cardiovascular:     Rate and Rhythm: Normal rate and regular rhythm.     Pulses: Normal pulses.     Heart sounds: Normal heart sounds. No murmur heard.   No friction rub. No gallop.  Pulmonary:     Effort: Pulmonary effort is normal. No respiratory distress.     Breath sounds: Normal breath sounds. No wheezing or rhonchi.  Chest:     Chest wall: No tenderness.  Abdominal:     General: Abdomen is flat. There is no distension.     Palpations: Abdomen is soft.     Tenderness: There is no abdominal tenderness.  Musculoskeletal:        General: Normal range of motion.     Cervical back: Normal range of motion and neck supple.     Right lower leg: No edema.     Left lower leg: No edema.  Skin:    General: Skin is warm and dry.     Capillary Refill: Capillary refill takes less than 2 seconds.     Findings: No lesion or rash.  Neurological:     General: No focal deficit present.     Mental Status: She is alert and oriented to person, place, and time. Mental status is at baseline.  Psychiatric:        Mood and Affect: Mood normal.    No results found for any visits on 12/19/20.  Assessment & Plan     Problem List Items Addressed This Visit       Respiratory   COPD (chronic obstructive pulmonary disease) ([place])    Chronic and well controlled Significant improvement with Trelegy - will continue Continuous O2 as above      Chronic hypoxemic respiratory failure ([facility]) - Primary    Continue continuous home O2 - 2L        Other   MDD (major depressive disorder), recurrent, in full remission ([place])    Continue Effexor Pharmacist at  skilled nursing facility had recommended decreasing dose. All communication about psychiatric medication should go through psychiatry        Return in about 3 months (around 03/19/2021) for AWV with NHA, chronic disease f/u with me.      Nelva Nay, Medical Student 12/20/2020, 8:26 AM   Patient seen along with MS3 student Nelva Nay. I personally evaluated this patient along with the student, and verified all aspects of the history, physical exam, and medical decision making as documented by the student. I agree with the student's documentation and have  made all necessary edits.  Terri Rorrer, Dionne Bucy, MD, MPH [facility]

## 2020-12-19 NOTE — Assessment & Plan Note (Signed)
Continue Effexor Pharmacist at skilled nursing facility had recommended decreasing dose. All communication about psychiatric medication should go through psychiatry

## 2020-12-20 NOTE — Assessment & Plan Note (Signed)
Chronic and well controlled Significant improvement with Trelegy - will continue Continuous O2 as above

## 2020-12-20 NOTE — Assessment & Plan Note (Signed)
Continue continuous home O2 - 2L

## 2020-12-22 ENCOUNTER — Encounter: Payer: Self-pay | Admitting: Oncology

## 2020-12-22 ENCOUNTER — Inpatient Hospital Stay: Payer: Medicare Other | Attending: Oncology

## 2020-12-22 ENCOUNTER — Inpatient Hospital Stay: Payer: Medicare Other

## 2020-12-22 ENCOUNTER — Inpatient Hospital Stay (HOSPITAL_BASED_OUTPATIENT_CLINIC_OR_DEPARTMENT_OTHER): Payer: Medicare Other | Admitting: Oncology

## 2020-12-22 ENCOUNTER — Other Ambulatory Visit: Payer: Self-pay

## 2020-12-22 VITALS — BP 146/64 | HR 97 | Temp 98.1°F

## 2020-12-22 VITALS — BP 151/69 | HR 91 | Temp 99.9°F | Resp 16 | Wt 104.0 lb

## 2020-12-22 DIAGNOSIS — J449 Chronic obstructive pulmonary disease, unspecified: Secondary | ICD-10-CM | POA: Insufficient documentation

## 2020-12-22 DIAGNOSIS — E039 Hypothyroidism, unspecified: Secondary | ICD-10-CM | POA: Insufficient documentation

## 2020-12-22 DIAGNOSIS — Z882 Allergy status to sulfonamides status: Secondary | ICD-10-CM | POA: Insufficient documentation

## 2020-12-22 DIAGNOSIS — Z886 Allergy status to analgesic agent status: Secondary | ICD-10-CM | POA: Insufficient documentation

## 2020-12-22 DIAGNOSIS — Z9049 Acquired absence of other specified parts of digestive tract: Secondary | ICD-10-CM | POA: Diagnosis not present

## 2020-12-22 DIAGNOSIS — R159 Full incontinence of feces: Secondary | ICD-10-CM | POA: Diagnosis not present

## 2020-12-22 DIAGNOSIS — I872 Venous insufficiency (chronic) (peripheral): Secondary | ICD-10-CM | POA: Insufficient documentation

## 2020-12-22 DIAGNOSIS — Z823 Family history of stroke: Secondary | ICD-10-CM | POA: Diagnosis not present

## 2020-12-22 DIAGNOSIS — R319 Hematuria, unspecified: Secondary | ICD-10-CM | POA: Diagnosis not present

## 2020-12-22 DIAGNOSIS — Z79899 Other long term (current) drug therapy: Secondary | ICD-10-CM | POA: Insufficient documentation

## 2020-12-22 DIAGNOSIS — D509 Iron deficiency anemia, unspecified: Secondary | ICD-10-CM | POA: Diagnosis not present

## 2020-12-22 DIAGNOSIS — R531 Weakness: Secondary | ICD-10-CM | POA: Insufficient documentation

## 2020-12-22 DIAGNOSIS — Z888 Allergy status to other drugs, medicaments and biological substances status: Secondary | ICD-10-CM | POA: Diagnosis not present

## 2020-12-22 DIAGNOSIS — Z8249 Family history of ischemic heart disease and other diseases of the circulatory system: Secondary | ICD-10-CM | POA: Diagnosis not present

## 2020-12-22 DIAGNOSIS — Z880 Allergy status to penicillin: Secondary | ICD-10-CM | POA: Insufficient documentation

## 2020-12-22 DIAGNOSIS — R5383 Other fatigue: Secondary | ICD-10-CM | POA: Diagnosis not present

## 2020-12-22 DIAGNOSIS — Z881 Allergy status to other antibiotic agents status: Secondary | ICD-10-CM | POA: Insufficient documentation

## 2020-12-22 LAB — CBC WITH DIFFERENTIAL/PLATELET
Abs Immature Granulocytes: 0.03 10*3/uL (ref 0.00–0.07)
Basophils Absolute: 0 10*3/uL (ref 0.0–0.1)
Basophils Relative: 0 %
Eosinophils Absolute: 0.2 10*3/uL (ref 0.0–0.5)
Eosinophils Relative: 2 %
HCT: 30.7 % — ABNORMAL LOW (ref 36.0–46.0)
Hemoglobin: 9.1 g/dL — ABNORMAL LOW (ref 12.0–15.0)
Immature Granulocytes: 0 %
Lymphocytes Relative: 15 %
Lymphs Abs: 1.2 10*3/uL (ref 0.7–4.0)
MCH: 27.9 pg (ref 26.0–34.0)
MCHC: 29.6 g/dL — ABNORMAL LOW (ref 30.0–36.0)
MCV: 94.2 fL (ref 80.0–100.0)
Monocytes Absolute: 0.5 10*3/uL (ref 0.1–1.0)
Monocytes Relative: 7 %
Neutro Abs: 5.6 10*3/uL (ref 1.7–7.7)
Neutrophils Relative %: 76 %
Platelets: 439 10*3/uL — ABNORMAL HIGH (ref 150–400)
RBC: 3.26 MIL/uL — ABNORMAL LOW (ref 3.87–5.11)
RDW: 16.9 % — ABNORMAL HIGH (ref 11.5–15.5)
WBC: 7.5 10*3/uL (ref 4.0–10.5)
nRBC: 0 % (ref 0.0–0.2)

## 2020-12-22 LAB — IRON AND TIBC
Iron: 25 ug/dL — ABNORMAL LOW (ref 28–170)
Saturation Ratios: 12 % (ref 10.4–31.8)
TIBC: 203 ug/dL — ABNORMAL LOW (ref 250–450)
UIBC: 178 ug/dL

## 2020-12-22 LAB — FERRITIN: Ferritin: 582 ng/mL — ABNORMAL HIGH (ref 11–307)

## 2020-12-22 MED ORDER — SODIUM CHLORIDE 0.9 % IV SOLN
510.0000 mg | Freq: Once | INTRAVENOUS | Status: AC
  Administered 2020-12-22: 510 mg via INTRAVENOUS
  Filled 2020-12-22: qty 510

## 2020-12-22 MED ORDER — SODIUM CHLORIDE 0.9 % IV SOLN
Freq: Once | INTRAVENOUS | Status: AC
  Filled 2020-12-22: qty 250

## 2020-12-22 NOTE — Progress Notes (Signed)
[facility]  [phone] [phone]  ID: Sandra Brown OB: 1926/01/31  MR#: [id number]  QZE#:[id number]  Patient Care Team: Virginia Crews, MD as PCP - General (Family Medicine) Lloyd Huger, MD as Consulting Physician (Hematology and Oncology)  CHIEF COMPLAINT: Iron deficiency anemia.  INTERVAL HISTORY: Sandra Brown is a 87 year old female with past medical history significant for hypothyroidism, COPD, allergic rhinitis, chronic venous insufficiency and GERD who is followed by Dr. Grayland Ormond for iron deficiency anemia.  She receives intermittent IV iron last given on 09/29/2020.  Reports doing fairly well.  Denies any rectal bleeding but has intermittent blood in her urine.  She is living at a skilled nursing facility and is incontinent of both bowel and bladder.  Overall stable.  REVIEW OF SYSTEMS:   Review of Systems  Constitutional:  Positive for malaise/fatigue. Negative for chills, fever and weight loss.  HENT:  Negative for congestion, ear pain and tinnitus.   Eyes: Negative.  Negative for blurred vision and double vision.  Respiratory: Negative.  Negative for cough, sputum production and shortness of breath.   Cardiovascular: Negative.  Negative for chest pain, palpitations and leg swelling.  Gastrointestinal: Negative.  Negative for abdominal pain, constipation, diarrhea, nausea and vomiting.  Genitourinary:  Negative for dysuria, frequency and urgency.  Musculoskeletal:  Negative for back pain and falls.  Skin: Negative.  Negative for rash.  Neurological:  Positive for weakness. Negative for headaches.  Endo/Heme/Allergies: Negative.  Does not bruise/bleed easily.  Psychiatric/Behavioral: Negative.  Negative for depression. The patient is not nervous/anxious and does not have insomnia.     PAST MEDICAL HISTORY: Past Medical History:  Diagnosis Date   Cataract    COPD (chronic obstructive pulmonary disease) ([facility])     Depression    Difficulty swallowing    Frequent headaches    Hearing loss    Hypertension    Hypothyroidism    Reflux     PAST SURGICAL HISTORY: Past Surgical History:  Procedure Laterality Date   ABDOMINAL HYSTERECTOMY     APPENDECTOMY     LUMBAR LAMINECTOMY     PARATHYROIDECTOMY     TOTAL HIP ARTHROPLASTY     x 4    FAMILY HISTORY: Family History  Problem Relation Age of Onset   Stroke Mother    Hypertension Mother    Heart disease Father    Hypertension Father     ADVANCED DIRECTIVES (Y/N):  N  HEALTH MAINTENANCE: Social History   Tobacco Use   Smoking status: Never   Smokeless tobacco: Never   Tobacco comments:    quit 1954  Vaping Use   Vaping Use: Never used  Substance Use Topics   Alcohol use: No   Drug use: No     Colonoscopy:  PAP:  Bone density:  Lipid panel:  Allergies  Allergen Reactions   Sulfa Antibiotics Rash and Itching    Other reaction(s): Diarrhea and vomiting (finding)   Erythromycin Nausea And Vomiting    Other reaction(s): Diarrhea and vomiting (finding)   Aspirin Other (See Comments), Tinitus and Nausea And Vomiting    Ringing of the ears, caused hearing loss both ears Ringing in ears   Contrast Media [Iodinated Diagnostic Agents] Rash and Hives   Latex Rash and Itching   Penicillin G Rash   Penicillins Rash    Other reaction(s): UNKNOWN   Tape Rash    Other reaction(s): UNKNOWN Adhesive Other reaction(s): UNKNOWN Adhesive    Current Outpatient Medications  Medication  Sig Dispense Refill   acetaminophen (TYLENOL) 650 MG CR tablet Take 650 mg by mouth every 8 (eight) hours as needed for pain.     AMBULATORY NON FORMULARY MEDICATION Medication Name: incentive spirometry Use as directed 1 each 0   Dextromethorphan-guaiFENesin (DELSYM COUGH/CHEST CONGEST DM) 5-100 MG/5ML LIQD Take 5 mLs by mouth every 12 (twelve) hours as needed (cough).     feeding supplement, GLUCERNA SHAKE, (GLUCERNA SHAKE) LIQD Take 237 mLs by mouth  3 (three) times daily between meals.  0   fluticasone (FLONASE) 50 MCG/ACT nasal spray Place 2 sprays into both nostrils daily. USE 2 SPRAYS NASALLY DAILY 16 g 6   Fluticasone-Umeclidin-Vilant (TRELEGY ELLIPTA) 100-62.5-25 MCG/INH AEPB Inhale 1 puff into the lungs daily. 1 each 11   guaifenesin (HUMIBID E) 400 MG TABS tablet Take 400 mg by mouth in the morning and at bedtime.     hydrocortisone 1 % lotion Apply 1 application topically as needed for itching.     ipratropium-albuterol (DUONEB) 0.5-2.5 (3) MG/3ML SOLN Take 3 mLs by nebulization 3 (three) times daily.     levocetirizine (XYZAL) 5 MG tablet Take 5 mg by mouth every evening.     levothyroxine (SYNTHROID) 50 MCG tablet Take 1 tablet (50 mcg total) by mouth daily. 90 tablet 1   loperamide (IMODIUM) 2 MG capsule Take 2 mg by mouth as needed for diarrhea or loose stools.     magnesium hydroxide (MILK OF MAGNESIA) 400 MG/5ML suspension Take by mouth daily as needed for mild constipation. 2 tbsp     Mouthwashes (MOUTHWASH/GARGLE MT) Use as directed 5 mLs in the mouth or throat in the morning, at noon, in the evening, and at bedtime. 1 tsp by mouth as needed for sore mouth and tongue     Multiple Vitamin (MULTIVITAMIN WITH MINERALS) TABS tablet Take 1 tablet by mouth daily.     nystatin (MYCOSTATIN/NYSTOP) powder Apply 1 application topically 2 (two) times daily as needed.     OXYGEN Inhale 2 L into the lungs continuous as needed.     pantoprazole (PROTONIX) 20 MG tablet Take 1 tablet (20 mg total) by mouth 2 (two) times daily. 180 tablet 3   senna (SENOKOT) 8.6 MG TABS tablet Take 1 tablet by mouth daily.     triamcinolone (KENALOG) 0.1 %      venlafaxine XR (EFFEXOR-XR) 75 MG 24 hr capsule Take 1 capsule (75 mg total) by mouth daily with breakfast. TAKE 1 CAPSULE DAILY WITH BREAKFAST 90 capsule 3   furosemide (LASIX) 20 MG tablet Take 1 tablet (20 mg total) by mouth daily for 5 days. 5 tablet 0   No current facility-administered medications  for this visit.    OBJECTIVE: Vitals:   12/22/20 1319  BP: (!) 151/69  Pulse: 91  Resp: 16  Temp: 99.9 F (37.7 C)     Body mass index is 20.31 kg/m.    ECOG FS:2 - Symptomatic, <50% confined to bed  Physical Exam Constitutional:      Appearance: Normal appearance.  HENT:     Head: Normocephalic and atraumatic.  Eyes:     Pupils: Pupils are equal, round, and reactive to light.  Cardiovascular:     Rate and Rhythm: Normal rate and regular rhythm.     Heart sounds: Normal heart sounds. No murmur heard. Pulmonary:     Effort: Pulmonary effort is normal.     Breath sounds: Normal breath sounds. No wheezing.  Abdominal:     General: Bowel  sounds are normal. There is no distension.     Palpations: Abdomen is soft.     Tenderness: There is no abdominal tenderness.  Musculoskeletal:        General: Normal range of motion.     Cervical back: Normal range of motion.  Skin:    General: Skin is warm and dry.     Findings: No rash.  Neurological:     Mental Status: She is alert and oriented to person, place, and time.  Psychiatric:        Judgment: Judgment normal.     LAB RESULTS:  Lab Results  Component Value Date   NA 139 09/28/2020   K 4.6 09/28/2020   CL 100 09/28/2020   CO2 29 09/28/2020   GLUCOSE 102 (H) 09/28/2020   BUN 12 09/28/2020   CREATININE 0.72 09/28/2020   CALCIUM 9.4 09/28/2020   PROT 8.0 09/28/2020   ALBUMIN 3.4 (L) 09/28/2020   AST 27 09/28/2020   ALT 17 09/28/2020   ALKPHOS 77 09/28/2020   BILITOT 0.7 09/28/2020   GFRNONAA >60 09/28/2020   GFRAA 41 11/23/2019    Lab Results  Component Value Date   WBC 7.5 12/22/2020   NEUTROABS 5.6 12/22/2020   HGB 9.1 (L) 12/22/2020   HCT 30.7 (L) 12/22/2020   MCV 94.2 12/22/2020   PLT 439 (H) 12/22/2020    Lab Results  Component Value Date   IRON 17 (L) 09/19/2020   TIBC 251 09/19/2020   IRONPCTSAT 7 (L) 09/19/2020   Lab Results  Component Value Date   FERRITIN 60 09/19/2020      STUDIES: No results found.   ASSESSMENT: Iron deficiency anemia.  PLAN:    Clinically she is doing well.  Daughter reports no significant change since her last visit.  Etiology likely combination of malabsorption, intermittent hematuria and underlying MDS.  Previous work-up included intelligen myeloid panel with variant of unknown significance with normal folate and B12 levels. No CKD.  Lab work shows decline in hemoglobin from 10.4-9.1.  Iron studies are pending during dictation.  Recommend 2 doses of IV Feraheme first dose today followed by a second dose next week.  Patient and daughter are in agreement with this plan.  COPD- Followed by PCP for this and on several inhalers.  Hematuria- Followed by Urology.  Disposition- IV Feraheme today followed by second dose next week.  Return to clinic in 3 months with lab work, MD assessment and possible iron.  Addendum- Ferritin 585.  Iron saturation 12%.  Proceed with plan above.  Ferritin is an acute phase reactant so likely not a true reading.  I spent 25 minutes dedicated to the care of this patient (face-to-face and non-face-to-face) on the date of the encounter to include what is described in the assessment and plan.  Patient expressed understanding and was in agreement with this plan. She also understands that She can call clinic at any time with any questions, concerns, or complaints.    Jacquelin Hawking, NP   12/22/2020 1:36 PM

## 2020-12-22 NOTE — Patient Instructions (Signed)

## 2020-12-22 NOTE — Progress Notes (Signed)
Medications reconciliation by current list provided by [facility].

## 2020-12-24 ENCOUNTER — Encounter: Payer: Self-pay | Admitting: Oncology

## 2021-01-02 ENCOUNTER — Inpatient Hospital Stay: Payer: Medicare Other

## 2021-01-02 ENCOUNTER — Other Ambulatory Visit: Payer: Self-pay

## 2021-01-02 VITALS — BP 152/87 | HR 104 | Temp 99.6°F | Resp 18

## 2021-01-02 DIAGNOSIS — R5383 Other fatigue: Secondary | ICD-10-CM | POA: Diagnosis not present

## 2021-01-02 DIAGNOSIS — R159 Full incontinence of feces: Secondary | ICD-10-CM | POA: Diagnosis not present

## 2021-01-02 DIAGNOSIS — J449 Chronic obstructive pulmonary disease, unspecified: Secondary | ICD-10-CM | POA: Diagnosis not present

## 2021-01-02 DIAGNOSIS — D509 Iron deficiency anemia, unspecified: Secondary | ICD-10-CM | POA: Diagnosis not present

## 2021-01-02 DIAGNOSIS — I872 Venous insufficiency (chronic) (peripheral): Secondary | ICD-10-CM | POA: Diagnosis not present

## 2021-01-02 DIAGNOSIS — E039 Hypothyroidism, unspecified: Secondary | ICD-10-CM | POA: Diagnosis not present

## 2021-01-02 MED ORDER — SODIUM CHLORIDE 0.9 % IV SOLN
510.0000 mg | Freq: Once | INTRAVENOUS | Status: AC
  Administered 2021-01-02: 14:00:00 510 mg via INTRAVENOUS
  Filled 2021-01-02: qty 17

## 2021-01-02 MED ORDER — SODIUM CHLORIDE 0.9% FLUSH
10.0000 mL | Freq: Once | INTRAVENOUS | Status: DC
  Filled 2021-01-02: qty 10

## 2021-01-02 MED ORDER — SODIUM CHLORIDE 0.9 % IV SOLN
Freq: Once | INTRAVENOUS | Status: AC
  Filled 2021-01-02: qty 250

## 2021-01-25 DIAGNOSIS — R2689 Other abnormalities of gait and mobility: Secondary | ICD-10-CM | POA: Diagnosis not present

## 2021-01-25 DIAGNOSIS — M6281 Muscle weakness (generalized): Secondary | ICD-10-CM | POA: Diagnosis not present

## 2021-01-25 DIAGNOSIS — M810 Age-related osteoporosis without current pathological fracture: Secondary | ICD-10-CM | POA: Diagnosis not present

## 2021-01-25 DIAGNOSIS — J441 Chronic obstructive pulmonary disease with (acute) exacerbation: Secondary | ICD-10-CM | POA: Diagnosis not present

## 2021-01-25 DIAGNOSIS — R4189 Other symptoms and signs involving cognitive functions and awareness: Secondary | ICD-10-CM | POA: Diagnosis not present

## 2021-01-28 DIAGNOSIS — R4189 Other symptoms and signs involving cognitive functions and awareness: Secondary | ICD-10-CM | POA: Diagnosis not present

## 2021-01-28 DIAGNOSIS — M810 Age-related osteoporosis without current pathological fracture: Secondary | ICD-10-CM | POA: Diagnosis not present

## 2021-01-28 DIAGNOSIS — R2689 Other abnormalities of gait and mobility: Secondary | ICD-10-CM | POA: Diagnosis not present

## 2021-01-28 DIAGNOSIS — J441 Chronic obstructive pulmonary disease with (acute) exacerbation: Secondary | ICD-10-CM | POA: Diagnosis not present

## 2021-01-28 DIAGNOSIS — M6281 Muscle weakness (generalized): Secondary | ICD-10-CM | POA: Diagnosis not present

## 2021-01-31 DIAGNOSIS — M810 Age-related osteoporosis without current pathological fracture: Secondary | ICD-10-CM | POA: Diagnosis not present

## 2021-01-31 DIAGNOSIS — R4189 Other symptoms and signs involving cognitive functions and awareness: Secondary | ICD-10-CM | POA: Diagnosis not present

## 2021-01-31 DIAGNOSIS — R2689 Other abnormalities of gait and mobility: Secondary | ICD-10-CM | POA: Diagnosis not present

## 2021-01-31 DIAGNOSIS — M6281 Muscle weakness (generalized): Secondary | ICD-10-CM | POA: Diagnosis not present

## 2021-01-31 DIAGNOSIS — J441 Chronic obstructive pulmonary disease with (acute) exacerbation: Secondary | ICD-10-CM | POA: Diagnosis not present

## 2021-02-02 ENCOUNTER — Other Ambulatory Visit: Payer: Self-pay

## 2021-02-02 ENCOUNTER — Encounter: Payer: Self-pay | Admitting: Psychiatry

## 2021-02-02 ENCOUNTER — Telehealth (INDEPENDENT_AMBULATORY_CARE_PROVIDER_SITE_OTHER): Payer: Medicare Other | Admitting: Psychiatry

## 2021-02-02 DIAGNOSIS — F5101 Primary insomnia: Secondary | ICD-10-CM

## 2021-02-02 DIAGNOSIS — F424 Excoriation (skin-picking) disorder: Secondary | ICD-10-CM | POA: Diagnosis not present

## 2021-02-02 DIAGNOSIS — F3342 Major depressive disorder, recurrent, in full remission: Secondary | ICD-10-CM

## 2021-02-02 DIAGNOSIS — F0670 Mild neurocognitive disorder due to known physiological condition without behavioral disturbance: Secondary | ICD-10-CM

## 2021-02-02 NOTE — Progress Notes (Signed)
Virtual Visit via Video Note  I connected with Sandra Brown on 02/02/21 at 10:30 AM EST by a video enabled telemedicine application and verified that I am speaking with the correct person using two identifiers.  Location Provider Location : [facility] Patient Location : SNF  Participants: Patient , Daughter,Provider   I discussed the limitations of evaluation and management by telemedicine and the availability of in person appointments. The patient expressed understanding and agreed to proceed.   I discussed the assessment and treatment plan with the patient. The patient was provided an opportunity to ask questions and all were answered. The patient agreed with the plan and demonstrated an understanding of the instructions.   The patient was advised to call back or seek an in-person evaluation if the symptoms worsen or if the condition fails to improve as anticipated.    [facility] MD OP Progress Note  02/02/2021 11:03 AM Sandra Brown  MRN:  [id number]  Chief Complaint:  Chief Complaint   Follow-up 88 year old Caucasian female with history of depression, skin picking disorder, mild neurocognitive disorder, insomnia, presents for medication management.    HPI: Sandra Brown is a 88 year old Caucasian female who lives at skilled nursing facility, has a history of MDD, skin picking disorder, COPD, hypothyroidism, hypertension, cognitive disorder was evaluated by telemedicine today.  Collateral information obtained from daughter-[name].  Patient today appeared to be pleasant, cooperative.  She was able to answer questions with some support.  Patient appeared to be alert and oriented to the season although she could not give the date or the month or the year.  Patient was able to state what she had for breakfast this morning as well as what happened the past weeks and the fact that her other children had come to visit her.  Patient denies any significant anxiety or depression.  Did not  express any suicidality, homicidality.  Did not appear to be confused or or preoccupied with any delusions.  According to daughter patient is currently doing fairly well on the venlafaxine.  She does continue to have memory loss especially short-term memory problems like remembering names or certain things that needs to be repeated.  However she has good support system at the skilled nursing facility.  She is currently in a wheelchair and needs assistance to take care of her activities of daily living.  Patient is currently working with a physical therapist to get some strength back to walk again.  She is currently on 24/7 oxygen.  She is sleeping through the night since they have a better sleep schedule at this facility.  Appetite seems to be fair.  Denies any other concerns today.  Visit Diagnosis:    ICD-10-CM   1. MDD (major depressive disorder), recurrent, in full remission ([place])  F33.42     2. Primary insomnia  F51.01     3. Skin-picking disorder  F42.4     4. Mild neurocognitive disorder due to multiple etiologies  F06.70       Past Psychiatric History: Reviewed past psychiatric history from progress note on 03/21/2017.  Past Medical History:  Past Medical History:  Diagnosis Date   Cataract    COPD (chronic obstructive pulmonary disease) ([place])    Depression    Difficulty swallowing    Frequent headaches    Hearing loss    Hypertension    Hypothyroidism    Reflux     Past Surgical History:  Procedure Laterality Date   ABDOMINAL HYSTERECTOMY  APPENDECTOMY     LUMBAR LAMINECTOMY     PARATHYROIDECTOMY     TOTAL HIP ARTHROPLASTY     x 4    Family Psychiatric History: Reviewed family psychiatric history from progress note on 03/21/2017.  Family History:  Family History  Problem Relation Age of Onset   Stroke Mother    Hypertension Mother    Heart disease Father    Hypertension Father     Social History: Reviewed social history from progress note on  03/21/2017. Social History   Socioeconomic History   Marital status: Widowed    Spouse name: Not on file   Number of children: Not on file   Years of education: Not on file   Highest education level: Not on file  Occupational History   Not on file  Tobacco Use   Smoking status: Never   Smokeless tobacco: Never   Tobacco comments:    quit 1954  Vaping Use   Vaping Use: Never used  Substance and Sexual Activity   Alcohol use: No   Drug use: No   Sexual activity: Not Currently  Other Topics Concern   Not on file  Social History Narrative   Not on file   Social Determinants of Health   Financial Resource Strain: Not on file  Food Insecurity: No Food Insecurity   Worried About Running Out of Food in the Last Year: Never true   [place] in the Last Year: Never true  Transportation Needs: No Transportation Needs   Lack of Transportation (Medical): No   Lack of Transportation (Non-Medical): No  Physical Activity: Not on file  Stress: Not on file  Social Connections: Not on file    Allergies:  Allergies  Allergen Reactions   Sulfa Antibiotics Rash and Itching    Other reaction(s): Diarrhea and vomiting (finding)   Erythromycin Nausea And Vomiting    Other reaction(s): Diarrhea and vomiting (finding)   Aspirin Other (See Comments), Tinitus and Nausea And Vomiting    Ringing of the ears, caused hearing loss both ears Ringing in ears   Contrast Media [Iodinated Contrast Media] Rash and Hives   Latex Rash and Itching   Penicillin G Rash   Penicillins Rash    Other reaction(s): UNKNOWN   Tape Rash    Other reaction(s): UNKNOWN Adhesive Other reaction(s): UNKNOWN Adhesive    Metabolic Disorder Labs: No results found for: HGBA1C, MPG No results found for: PROLACTIN No results found for: CHOL, TRIG, HDL, CHOLHDL, VLDL, LDLCALC Lab Results  Component Value Date   TSH 3.066 09/19/2020   TSH 0.85 11/23/2019    Therapeutic Level Labs: No results found for:  LITHIUM No results found for: VALPROATE No components found for:  CBMZ  Current Medications: Current Outpatient Medications  Medication Sig Dispense Refill   acetaminophen (TYLENOL) 650 MG CR tablet Take 650 mg by mouth every 8 (eight) hours as needed for pain.     AMBULATORY NON FORMULARY MEDICATION Medication Name: incentive spirometry Use as directed 1 each 0   Dextromethorphan-guaiFENesin (DELSYM COUGH/CHEST CONGEST DM) 5-100 MG/5ML LIQD Take 5 mLs by mouth every 12 (twelve) hours as needed (cough).     feeding supplement, GLUCERNA SHAKE, (GLUCERNA SHAKE) LIQD Take 237 mLs by mouth 3 (three) times daily between meals.  0   fluticasone (FLONASE) 50 MCG/ACT nasal spray Place 2 sprays into both nostrils daily. USE 2 SPRAYS NASALLY DAILY 16 g 6   Fluticasone-Umeclidin-Vilant (TRELEGY ELLIPTA) 100-62.5-25 MCG/INH AEPB Inhale 1 puff into  the lungs daily. 1 each 11   furosemide (LASIX) 20 MG tablet Take 1 tablet (20 mg total) by mouth daily for 5 days. 5 tablet 0   guaifenesin (HUMIBID E) 400 MG TABS tablet Take 400 mg by mouth in the morning and at bedtime.     hydrocortisone 1 % lotion Apply 1 application topically as needed for itching.     ipratropium-albuterol (DUONEB) 0.5-2.5 (3) MG/3ML SOLN Take 3 mLs by nebulization 3 (three) times daily.     levocetirizine (XYZAL) 5 MG tablet Take 5 mg by mouth every evening.     levothyroxine (SYNTHROID) 50 MCG tablet Take 1 tablet (50 mcg total) by mouth daily. 90 tablet 1   loperamide (IMODIUM) 2 MG capsule Take 2 mg by mouth as needed for diarrhea or loose stools.     magnesium hydroxide (MILK OF MAGNESIA) 400 MG/5ML suspension Take by mouth daily as needed for mild constipation. 2 tbsp     Mouthwashes (MOUTHWASH/GARGLE MT) Use as directed 5 mLs in the mouth or throat in the morning, at noon, in the evening, and at bedtime. 1 tsp by mouth as needed for sore mouth and tongue     Multiple Vitamin (MULTIVITAMIN WITH MINERALS) TABS tablet Take 1 tablet  by mouth daily.     nystatin (MYCOSTATIN/NYSTOP) powder Apply 1 application topically 2 (two) times daily as needed.     OXYGEN Inhale 2 L into the lungs continuous as needed.     pantoprazole (PROTONIX) 20 MG tablet Take 1 tablet (20 mg total) by mouth 2 (two) times daily. 180 tablet 3   senna (SENOKOT) 8.6 MG TABS tablet Take 1 tablet by mouth daily.     triamcinolone (KENALOG) 0.1 %      venlafaxine XR (EFFEXOR-XR) 75 MG 24 hr capsule Take 1 capsule (75 mg total) by mouth daily with breakfast. TAKE 1 CAPSULE DAILY WITH BREAKFAST 90 capsule 3   No current facility-administered medications for this visit.     Musculoskeletal: Strength & Muscle Tone:  UTA Gait & Station:  Seated Patient leans: N/A  Psychiatric Specialty Exam: Review of Systems  Unable to perform ROS: Dementia   There were no vitals taken for this visit.There is no height or weight on file to calculate BMI.  General Appearance: Casual  Eye Contact:  Fair  Speech:  Normal Rate  Volume:  Decreased  Mood:  Euthymic  Affect:  Full Range  Thought Process:  Goal Directed and Descriptions of Associations: Intact  Orientation:  Other:  self, place, situation, season  Thought Content: Logical   Suicidal Thoughts:  No  Homicidal Thoughts:  No  Memory:  Immediate;   Fair Recent;   limited Remote;   Poor  Judgement:  Fair  Insight:  Fair  Psychomotor Activity:  Decreased  Concentration:  Concentration: Fair and Attention Span: Fair  Recall:  [employer] of Knowledge: Fair  Language: Fair  Akathisia:  No  Handed:  Right  AIMS (if indicated): not done  Assets:  Communication Skills Desire for Improvement Housing Social Support  ADL's:  Intact  Cognition: Impaired,  Mild chronic  Sleep:  Fair   Screenings: GAD-7    [place] Office Visit from 05/12/2020 in [place]  Total GAD-7 Score 3      PHQ2-9    [place] Visit from 09/18/2020 in [place] Visit  from 05/12/2020 in [place] Visit from 10[facility]/2019 in [facility] Procedure visit  from 09/04/2015 in [place] Procedure visit from 10/03/2014 in [facility]  PHQ-2 Total Score 1 0 2 0 0  PHQ-9 Total Score 3 0 4 -- --      Flowsheet Row ED from 09/28/2020 in [facility] ED to Hosp-Admission (Discharged) from 05/30/2020 in [facility] [facility] ED from 04/01/2020 in [place]  C-SSRS RISK CATEGORY No Risk No Risk No Risk        Assessment and Plan: Sandra Brown is a 88 year old Caucasian female who has a history of depression, COPD, hypothyroidism, hypertension, renal function abnormalities was evaluated by telemedicine today.  Patient is currently stable with regards to her mood.  Plan as noted below.  Plan MDD in remission Continue venlafaxine extended release 75 mg p.o. daily.  Skin picking disorder-stable We will monitor closely. Continue venlafaxine extended release 75 mg p.o. daily   Cognitive disorder, mild-chronic Patient is currently at a skilled nursing facility and has 24/7 support.  Collateral information obtained from daughter who was present in session today.  Daughter agrees to fax over current medication list to our fax #[phone].  Reviewed and discussed most recent labs-CBC with differential-hemoglobin-low at 9.1-patient receiving infusions for iron deficiency anemia. Reviewed and discussed sodium-dated 09/28/2020-within normal limits.  Reviewed and discussed TSH-dated 09/19/2020-within normal limits.  Follow-up in clinic in 4 to 5 months or sooner if needed.  This note was generated in part or whole with voice recognition software. Voice recognition is usually quite accurate but there are transcription errors that can and very often do occur. I  apologize for any typographical errors that were not detected and corrected.      Ursula Alert, MD 02/02/2021, 11:03 AM

## 2021-02-03 DIAGNOSIS — R2689 Other abnormalities of gait and mobility: Secondary | ICD-10-CM | POA: Diagnosis not present

## 2021-02-03 DIAGNOSIS — J441 Chronic obstructive pulmonary disease with (acute) exacerbation: Secondary | ICD-10-CM | POA: Diagnosis not present

## 2021-02-03 DIAGNOSIS — M810 Age-related osteoporosis without current pathological fracture: Secondary | ICD-10-CM | POA: Diagnosis not present

## 2021-02-03 DIAGNOSIS — R4189 Other symptoms and signs involving cognitive functions and awareness: Secondary | ICD-10-CM | POA: Diagnosis not present

## 2021-02-03 DIAGNOSIS — M6281 Muscle weakness (generalized): Secondary | ICD-10-CM | POA: Diagnosis not present

## 2021-02-04 DIAGNOSIS — R4189 Other symptoms and signs involving cognitive functions and awareness: Secondary | ICD-10-CM | POA: Diagnosis not present

## 2021-02-04 DIAGNOSIS — M810 Age-related osteoporosis without current pathological fracture: Secondary | ICD-10-CM | POA: Diagnosis not present

## 2021-02-04 DIAGNOSIS — R2689 Other abnormalities of gait and mobility: Secondary | ICD-10-CM | POA: Diagnosis not present

## 2021-02-04 DIAGNOSIS — M6281 Muscle weakness (generalized): Secondary | ICD-10-CM | POA: Diagnosis not present

## 2021-02-04 DIAGNOSIS — J441 Chronic obstructive pulmonary disease with (acute) exacerbation: Secondary | ICD-10-CM | POA: Diagnosis not present

## 2021-02-07 DIAGNOSIS — M810 Age-related osteoporosis without current pathological fracture: Secondary | ICD-10-CM | POA: Diagnosis not present

## 2021-02-07 DIAGNOSIS — J441 Chronic obstructive pulmonary disease with (acute) exacerbation: Secondary | ICD-10-CM | POA: Diagnosis not present

## 2021-02-07 DIAGNOSIS — R4189 Other symptoms and signs involving cognitive functions and awareness: Secondary | ICD-10-CM | POA: Diagnosis not present

## 2021-02-07 DIAGNOSIS — M6281 Muscle weakness (generalized): Secondary | ICD-10-CM | POA: Diagnosis not present

## 2021-02-07 DIAGNOSIS — R2689 Other abnormalities of gait and mobility: Secondary | ICD-10-CM | POA: Diagnosis not present

## 2021-02-08 DIAGNOSIS — R2689 Other abnormalities of gait and mobility: Secondary | ICD-10-CM | POA: Diagnosis not present

## 2021-02-08 DIAGNOSIS — R4189 Other symptoms and signs involving cognitive functions and awareness: Secondary | ICD-10-CM | POA: Diagnosis not present

## 2021-02-08 DIAGNOSIS — J441 Chronic obstructive pulmonary disease with (acute) exacerbation: Secondary | ICD-10-CM | POA: Diagnosis not present

## 2021-02-08 DIAGNOSIS — M6281 Muscle weakness (generalized): Secondary | ICD-10-CM | POA: Diagnosis not present

## 2021-02-08 DIAGNOSIS — M810 Age-related osteoporosis without current pathological fracture: Secondary | ICD-10-CM | POA: Diagnosis not present

## 2021-02-10 DIAGNOSIS — J441 Chronic obstructive pulmonary disease with (acute) exacerbation: Secondary | ICD-10-CM | POA: Diagnosis not present

## 2021-02-10 DIAGNOSIS — R4189 Other symptoms and signs involving cognitive functions and awareness: Secondary | ICD-10-CM | POA: Diagnosis not present

## 2021-02-10 DIAGNOSIS — M810 Age-related osteoporosis without current pathological fracture: Secondary | ICD-10-CM | POA: Diagnosis not present

## 2021-02-10 DIAGNOSIS — M6281 Muscle weakness (generalized): Secondary | ICD-10-CM | POA: Diagnosis not present

## 2021-02-10 DIAGNOSIS — R2689 Other abnormalities of gait and mobility: Secondary | ICD-10-CM | POA: Diagnosis not present

## 2021-02-11 DIAGNOSIS — R2689 Other abnormalities of gait and mobility: Secondary | ICD-10-CM | POA: Diagnosis not present

## 2021-02-11 DIAGNOSIS — R4189 Other symptoms and signs involving cognitive functions and awareness: Secondary | ICD-10-CM | POA: Diagnosis not present

## 2021-02-11 DIAGNOSIS — M810 Age-related osteoporosis without current pathological fracture: Secondary | ICD-10-CM | POA: Diagnosis not present

## 2021-02-11 DIAGNOSIS — M6281 Muscle weakness (generalized): Secondary | ICD-10-CM | POA: Diagnosis not present

## 2021-02-11 DIAGNOSIS — J441 Chronic obstructive pulmonary disease with (acute) exacerbation: Secondary | ICD-10-CM | POA: Diagnosis not present

## 2021-02-12 DIAGNOSIS — I7091 Generalized atherosclerosis: Secondary | ICD-10-CM | POA: Diagnosis not present

## 2021-02-12 DIAGNOSIS — L603 Nail dystrophy: Secondary | ICD-10-CM | POA: Diagnosis not present

## 2021-02-12 DIAGNOSIS — R600 Localized edema: Secondary | ICD-10-CM | POA: Diagnosis not present

## 2021-02-15 DIAGNOSIS — M810 Age-related osteoporosis without current pathological fracture: Secondary | ICD-10-CM | POA: Diagnosis not present

## 2021-02-15 DIAGNOSIS — R2689 Other abnormalities of gait and mobility: Secondary | ICD-10-CM | POA: Diagnosis not present

## 2021-02-15 DIAGNOSIS — R4189 Other symptoms and signs involving cognitive functions and awareness: Secondary | ICD-10-CM | POA: Diagnosis not present

## 2021-02-15 DIAGNOSIS — M6281 Muscle weakness (generalized): Secondary | ICD-10-CM | POA: Diagnosis not present

## 2021-02-15 DIAGNOSIS — J441 Chronic obstructive pulmonary disease with (acute) exacerbation: Secondary | ICD-10-CM | POA: Diagnosis not present

## 2021-02-20 DIAGNOSIS — M6281 Muscle weakness (generalized): Secondary | ICD-10-CM | POA: Diagnosis not present

## 2021-02-20 DIAGNOSIS — M810 Age-related osteoporosis without current pathological fracture: Secondary | ICD-10-CM | POA: Diagnosis not present

## 2021-02-20 DIAGNOSIS — J441 Chronic obstructive pulmonary disease with (acute) exacerbation: Secondary | ICD-10-CM | POA: Diagnosis not present

## 2021-02-20 DIAGNOSIS — R2689 Other abnormalities of gait and mobility: Secondary | ICD-10-CM | POA: Diagnosis not present

## 2021-02-20 DIAGNOSIS — R4189 Other symptoms and signs involving cognitive functions and awareness: Secondary | ICD-10-CM | POA: Diagnosis not present

## 2021-02-22 DIAGNOSIS — J441 Chronic obstructive pulmonary disease with (acute) exacerbation: Secondary | ICD-10-CM | POA: Diagnosis not present

## 2021-02-22 DIAGNOSIS — M6281 Muscle weakness (generalized): Secondary | ICD-10-CM | POA: Diagnosis not present

## 2021-02-22 DIAGNOSIS — R2689 Other abnormalities of gait and mobility: Secondary | ICD-10-CM | POA: Diagnosis not present

## 2021-02-22 DIAGNOSIS — R4189 Other symptoms and signs involving cognitive functions and awareness: Secondary | ICD-10-CM | POA: Diagnosis not present

## 2021-02-22 DIAGNOSIS — M810 Age-related osteoporosis without current pathological fracture: Secondary | ICD-10-CM | POA: Diagnosis not present

## 2021-02-24 DIAGNOSIS — R4189 Other symptoms and signs involving cognitive functions and awareness: Secondary | ICD-10-CM | POA: Diagnosis not present

## 2021-02-24 DIAGNOSIS — J441 Chronic obstructive pulmonary disease with (acute) exacerbation: Secondary | ICD-10-CM | POA: Diagnosis not present

## 2021-02-24 DIAGNOSIS — R2689 Other abnormalities of gait and mobility: Secondary | ICD-10-CM | POA: Diagnosis not present

## 2021-02-24 DIAGNOSIS — M6281 Muscle weakness (generalized): Secondary | ICD-10-CM | POA: Diagnosis not present

## 2021-02-24 DIAGNOSIS — M810 Age-related osteoporosis without current pathological fracture: Secondary | ICD-10-CM | POA: Diagnosis not present

## 2021-03-01 DIAGNOSIS — R4189 Other symptoms and signs involving cognitive functions and awareness: Secondary | ICD-10-CM | POA: Diagnosis not present

## 2021-03-01 DIAGNOSIS — J441 Chronic obstructive pulmonary disease with (acute) exacerbation: Secondary | ICD-10-CM | POA: Diagnosis not present

## 2021-03-01 DIAGNOSIS — M810 Age-related osteoporosis without current pathological fracture: Secondary | ICD-10-CM | POA: Diagnosis not present

## 2021-03-01 DIAGNOSIS — M6281 Muscle weakness (generalized): Secondary | ICD-10-CM | POA: Diagnosis not present

## 2021-03-01 DIAGNOSIS — R2689 Other abnormalities of gait and mobility: Secondary | ICD-10-CM | POA: Diagnosis not present

## 2021-03-02 DIAGNOSIS — J441 Chronic obstructive pulmonary disease with (acute) exacerbation: Secondary | ICD-10-CM | POA: Diagnosis not present

## 2021-03-02 DIAGNOSIS — M6281 Muscle weakness (generalized): Secondary | ICD-10-CM | POA: Diagnosis not present

## 2021-03-02 DIAGNOSIS — M810 Age-related osteoporosis without current pathological fracture: Secondary | ICD-10-CM | POA: Diagnosis not present

## 2021-03-02 DIAGNOSIS — R4189 Other symptoms and signs involving cognitive functions and awareness: Secondary | ICD-10-CM | POA: Diagnosis not present

## 2021-03-02 DIAGNOSIS — R2689 Other abnormalities of gait and mobility: Secondary | ICD-10-CM | POA: Diagnosis not present

## 2021-03-07 DIAGNOSIS — M6281 Muscle weakness (generalized): Secondary | ICD-10-CM | POA: Diagnosis not present

## 2021-03-07 DIAGNOSIS — M810 Age-related osteoporosis without current pathological fracture: Secondary | ICD-10-CM | POA: Diagnosis not present

## 2021-03-07 DIAGNOSIS — R2689 Other abnormalities of gait and mobility: Secondary | ICD-10-CM | POA: Diagnosis not present

## 2021-03-07 DIAGNOSIS — J441 Chronic obstructive pulmonary disease with (acute) exacerbation: Secondary | ICD-10-CM | POA: Diagnosis not present

## 2021-03-07 DIAGNOSIS — R4189 Other symptoms and signs involving cognitive functions and awareness: Secondary | ICD-10-CM | POA: Diagnosis not present

## 2021-03-08 DIAGNOSIS — R4189 Other symptoms and signs involving cognitive functions and awareness: Secondary | ICD-10-CM | POA: Diagnosis not present

## 2021-03-08 DIAGNOSIS — M810 Age-related osteoporosis without current pathological fracture: Secondary | ICD-10-CM | POA: Diagnosis not present

## 2021-03-08 DIAGNOSIS — J441 Chronic obstructive pulmonary disease with (acute) exacerbation: Secondary | ICD-10-CM | POA: Diagnosis not present

## 2021-03-08 DIAGNOSIS — R2689 Other abnormalities of gait and mobility: Secondary | ICD-10-CM | POA: Diagnosis not present

## 2021-03-08 DIAGNOSIS — M6281 Muscle weakness (generalized): Secondary | ICD-10-CM | POA: Diagnosis not present

## 2021-03-12 DIAGNOSIS — M6281 Muscle weakness (generalized): Secondary | ICD-10-CM | POA: Diagnosis not present

## 2021-03-12 DIAGNOSIS — M810 Age-related osteoporosis without current pathological fracture: Secondary | ICD-10-CM | POA: Diagnosis not present

## 2021-03-12 DIAGNOSIS — R2689 Other abnormalities of gait and mobility: Secondary | ICD-10-CM | POA: Diagnosis not present

## 2021-03-12 DIAGNOSIS — J441 Chronic obstructive pulmonary disease with (acute) exacerbation: Secondary | ICD-10-CM | POA: Diagnosis not present

## 2021-03-12 DIAGNOSIS — R4189 Other symptoms and signs involving cognitive functions and awareness: Secondary | ICD-10-CM | POA: Diagnosis not present

## 2021-03-15 DIAGNOSIS — M810 Age-related osteoporosis without current pathological fracture: Secondary | ICD-10-CM | POA: Diagnosis not present

## 2021-03-15 DIAGNOSIS — J441 Chronic obstructive pulmonary disease with (acute) exacerbation: Secondary | ICD-10-CM | POA: Diagnosis not present

## 2021-03-15 DIAGNOSIS — R4189 Other symptoms and signs involving cognitive functions and awareness: Secondary | ICD-10-CM | POA: Diagnosis not present

## 2021-03-15 DIAGNOSIS — R2689 Other abnormalities of gait and mobility: Secondary | ICD-10-CM | POA: Diagnosis not present

## 2021-03-15 DIAGNOSIS — M6281 Muscle weakness (generalized): Secondary | ICD-10-CM | POA: Diagnosis not present

## 2021-03-18 ENCOUNTER — Encounter: Payer: Self-pay | Admitting: Family Medicine

## 2021-03-18 DIAGNOSIS — R0602 Shortness of breath: Secondary | ICD-10-CM | POA: Diagnosis not present

## 2021-03-18 DIAGNOSIS — R918 Other nonspecific abnormal finding of lung field: Secondary | ICD-10-CM | POA: Diagnosis not present

## 2021-03-18 NOTE — Progress Notes (Unsigned)
Received call from nursing home that patient had been feeling more short of breath with oxygen saturations dropping into the low 80s, but recovered to mid 90s after nebulizer treatments. Chest XR report today noted bilateral patchy opacities left more than right. Counseled that likely has pneumonia and could be life threatening and that aggressive treatment would require iv antibiotic at hospital. However told that patient and family do not want aggressive treatment at ER or hospital. Therefore advised to start Levaquin 750 QD x 7 days which I was told is available from in-house pharmacy.  ?

## 2021-03-19 DIAGNOSIS — J181 Lobar pneumonia, unspecified organism: Secondary | ICD-10-CM | POA: Diagnosis not present

## 2021-03-19 DIAGNOSIS — J9611 Chronic respiratory failure with hypoxia: Secondary | ICD-10-CM | POA: Diagnosis not present

## 2021-03-19 DIAGNOSIS — J441 Chronic obstructive pulmonary disease with (acute) exacerbation: Secondary | ICD-10-CM | POA: Diagnosis not present

## 2021-03-19 DIAGNOSIS — E440 Moderate protein-calorie malnutrition: Secondary | ICD-10-CM | POA: Diagnosis not present

## 2021-03-20 ENCOUNTER — Telehealth: Payer: Self-pay | Admitting: *Deleted

## 2021-03-20 ENCOUNTER — Encounter: Payer: Self-pay | Admitting: Family Medicine

## 2021-03-20 DIAGNOSIS — Z8701 Personal history of pneumonia (recurrent): Secondary | ICD-10-CM | POA: Diagnosis not present

## 2021-03-20 DIAGNOSIS — K219 Gastro-esophageal reflux disease without esophagitis: Secondary | ICD-10-CM | POA: Diagnosis not present

## 2021-03-20 DIAGNOSIS — J449 Chronic obstructive pulmonary disease, unspecified: Secondary | ICD-10-CM | POA: Diagnosis not present

## 2021-03-20 DIAGNOSIS — Z6820 Body mass index (BMI) 20.0-20.9, adult: Secondary | ICD-10-CM | POA: Diagnosis not present

## 2021-03-20 DIAGNOSIS — E039 Hypothyroidism, unspecified: Secondary | ICD-10-CM | POA: Diagnosis not present

## 2021-03-20 DIAGNOSIS — D509 Iron deficiency anemia, unspecified: Secondary | ICD-10-CM | POA: Diagnosis not present

## 2021-03-20 DIAGNOSIS — M810 Age-related osteoporosis without current pathological fracture: Secondary | ICD-10-CM | POA: Diagnosis not present

## 2021-03-20 DIAGNOSIS — Z9981 Dependence on supplemental oxygen: Secondary | ICD-10-CM | POA: Diagnosis not present

## 2021-03-20 DIAGNOSIS — Z87891 Personal history of nicotine dependence: Secondary | ICD-10-CM | POA: Diagnosis not present

## 2021-03-20 DIAGNOSIS — R634 Abnormal weight loss: Secondary | ICD-10-CM | POA: Diagnosis not present

## 2021-03-20 DIAGNOSIS — E785 Hyperlipidemia, unspecified: Secondary | ICD-10-CM | POA: Diagnosis not present

## 2021-03-20 DIAGNOSIS — I129 Hypertensive chronic kidney disease with stage 1 through stage 4 chronic kidney disease, or unspecified chronic kidney disease: Secondary | ICD-10-CM | POA: Diagnosis not present

## 2021-03-20 DIAGNOSIS — R627 Adult failure to thrive: Secondary | ICD-10-CM | POA: Diagnosis not present

## 2021-03-20 DIAGNOSIS — N1832 Chronic kidney disease, stage 3b: Secondary | ICD-10-CM | POA: Diagnosis not present

## 2021-03-20 DIAGNOSIS — Z7722 Contact with and (suspected) exposure to environmental tobacco smoke (acute) (chronic): Secondary | ICD-10-CM | POA: Diagnosis not present

## 2021-03-20 DIAGNOSIS — M419 Scoliosis, unspecified: Secondary | ICD-10-CM | POA: Diagnosis not present

## 2021-03-20 DIAGNOSIS — J9691 Respiratory failure, unspecified with hypoxia: Secondary | ICD-10-CM | POA: Diagnosis not present

## 2021-03-20 DIAGNOSIS — F419 Anxiety disorder, unspecified: Secondary | ICD-10-CM | POA: Diagnosis not present

## 2021-03-20 DIAGNOSIS — J309 Allergic rhinitis, unspecified: Secondary | ICD-10-CM | POA: Diagnosis not present

## 2021-03-20 DIAGNOSIS — F32A Depression, unspecified: Secondary | ICD-10-CM | POA: Diagnosis not present

## 2021-03-20 NOTE — Telephone Encounter (Signed)
Daughter called to cancel upcoming March appointments patine is too ill to leave home. She will call to reschedule when patient is feeling better. ?

## 2021-03-21 ENCOUNTER — Telehealth: Payer: Self-pay

## 2021-03-21 ENCOUNTER — Encounter: Payer: Self-pay | Admitting: Oncology

## 2021-03-21 DIAGNOSIS — J449 Chronic obstructive pulmonary disease, unspecified: Secondary | ICD-10-CM | POA: Diagnosis not present

## 2021-03-21 DIAGNOSIS — D509 Iron deficiency anemia, unspecified: Secondary | ICD-10-CM | POA: Diagnosis not present

## 2021-03-21 DIAGNOSIS — J9691 Respiratory failure, unspecified with hypoxia: Secondary | ICD-10-CM | POA: Diagnosis not present

## 2021-03-21 DIAGNOSIS — I129 Hypertensive chronic kidney disease with stage 1 through stage 4 chronic kidney disease, or unspecified chronic kidney disease: Secondary | ICD-10-CM | POA: Diagnosis not present

## 2021-03-21 DIAGNOSIS — F419 Anxiety disorder, unspecified: Secondary | ICD-10-CM | POA: Diagnosis not present

## 2021-03-21 DIAGNOSIS — F32A Depression, unspecified: Secondary | ICD-10-CM | POA: Diagnosis not present

## 2021-03-21 NOTE — Telephone Encounter (Signed)
Returned call to daughter-[name]-who reports her mother has pneumonia, currently with hospice.  All of her medications except antibiotics since she is not eating or taking anything orally. ?Just wanted to let writer know. ? ?Some time was spent providing support and reassurance. ? ?Advised to call back with concerns. ?

## 2021-03-21 NOTE — Telephone Encounter (Signed)
daughter called left message that she needed to speak with provider because her mom is in hospics and she needed to speak with dr.eappen  ?

## 2021-03-22 DIAGNOSIS — I129 Hypertensive chronic kidney disease with stage 1 through stage 4 chronic kidney disease, or unspecified chronic kidney disease: Secondary | ICD-10-CM | POA: Diagnosis not present

## 2021-03-22 DIAGNOSIS — J9691 Respiratory failure, unspecified with hypoxia: Secondary | ICD-10-CM | POA: Diagnosis not present

## 2021-03-22 DIAGNOSIS — D509 Iron deficiency anemia, unspecified: Secondary | ICD-10-CM | POA: Diagnosis not present

## 2021-03-22 DIAGNOSIS — F32A Depression, unspecified: Secondary | ICD-10-CM | POA: Diagnosis not present

## 2021-03-22 DIAGNOSIS — F419 Anxiety disorder, unspecified: Secondary | ICD-10-CM | POA: Diagnosis not present

## 2021-03-22 DIAGNOSIS — J449 Chronic obstructive pulmonary disease, unspecified: Secondary | ICD-10-CM | POA: Diagnosis not present

## 2021-03-23 DIAGNOSIS — I129 Hypertensive chronic kidney disease with stage 1 through stage 4 chronic kidney disease, or unspecified chronic kidney disease: Secondary | ICD-10-CM | POA: Diagnosis not present

## 2021-03-23 DIAGNOSIS — F419 Anxiety disorder, unspecified: Secondary | ICD-10-CM | POA: Diagnosis not present

## 2021-03-23 DIAGNOSIS — J9691 Respiratory failure, unspecified with hypoxia: Secondary | ICD-10-CM | POA: Diagnosis not present

## 2021-03-23 DIAGNOSIS — F32A Depression, unspecified: Secondary | ICD-10-CM | POA: Diagnosis not present

## 2021-03-23 DIAGNOSIS — J449 Chronic obstructive pulmonary disease, unspecified: Secondary | ICD-10-CM | POA: Diagnosis not present

## 2021-03-23 DIAGNOSIS — D509 Iron deficiency anemia, unspecified: Secondary | ICD-10-CM | POA: Diagnosis not present

## 2021-03-25 DIAGNOSIS — J9691 Respiratory failure, unspecified with hypoxia: Secondary | ICD-10-CM | POA: Diagnosis not present

## 2021-03-25 DIAGNOSIS — D509 Iron deficiency anemia, unspecified: Secondary | ICD-10-CM | POA: Diagnosis not present

## 2021-03-25 DIAGNOSIS — I129 Hypertensive chronic kidney disease with stage 1 through stage 4 chronic kidney disease, or unspecified chronic kidney disease: Secondary | ICD-10-CM | POA: Diagnosis not present

## 2021-03-25 DIAGNOSIS — F419 Anxiety disorder, unspecified: Secondary | ICD-10-CM | POA: Diagnosis not present

## 2021-03-25 DIAGNOSIS — J449 Chronic obstructive pulmonary disease, unspecified: Secondary | ICD-10-CM | POA: Diagnosis not present

## 2021-03-25 DIAGNOSIS — F32A Depression, unspecified: Secondary | ICD-10-CM | POA: Diagnosis not present

## 2021-03-26 DIAGNOSIS — J9611 Chronic respiratory failure with hypoxia: Secondary | ICD-10-CM | POA: Diagnosis not present

## 2021-03-26 DIAGNOSIS — J9691 Respiratory failure, unspecified with hypoxia: Secondary | ICD-10-CM | POA: Diagnosis not present

## 2021-03-26 DIAGNOSIS — F32A Depression, unspecified: Secondary | ICD-10-CM | POA: Diagnosis not present

## 2021-03-26 DIAGNOSIS — J441 Chronic obstructive pulmonary disease with (acute) exacerbation: Secondary | ICD-10-CM | POA: Diagnosis not present

## 2021-03-26 DIAGNOSIS — J449 Chronic obstructive pulmonary disease, unspecified: Secondary | ICD-10-CM | POA: Diagnosis not present

## 2021-03-26 DIAGNOSIS — I129 Hypertensive chronic kidney disease with stage 1 through stage 4 chronic kidney disease, or unspecified chronic kidney disease: Secondary | ICD-10-CM | POA: Diagnosis not present

## 2021-03-26 DIAGNOSIS — D509 Iron deficiency anemia, unspecified: Secondary | ICD-10-CM | POA: Diagnosis not present

## 2021-03-26 DIAGNOSIS — F419 Anxiety disorder, unspecified: Secondary | ICD-10-CM | POA: Diagnosis not present

## 2021-03-28 DIAGNOSIS — F32A Depression, unspecified: Secondary | ICD-10-CM | POA: Diagnosis not present

## 2021-03-28 DIAGNOSIS — J9691 Respiratory failure, unspecified with hypoxia: Secondary | ICD-10-CM | POA: Diagnosis not present

## 2021-03-28 DIAGNOSIS — F419 Anxiety disorder, unspecified: Secondary | ICD-10-CM | POA: Diagnosis not present

## 2021-03-28 DIAGNOSIS — D509 Iron deficiency anemia, unspecified: Secondary | ICD-10-CM | POA: Diagnosis not present

## 2021-03-28 DIAGNOSIS — J449 Chronic obstructive pulmonary disease, unspecified: Secondary | ICD-10-CM | POA: Diagnosis not present

## 2021-03-28 DIAGNOSIS — I129 Hypertensive chronic kidney disease with stage 1 through stage 4 chronic kidney disease, or unspecified chronic kidney disease: Secondary | ICD-10-CM | POA: Diagnosis not present

## 2021-03-29 ENCOUNTER — Ambulatory Visit: Payer: Medicare Other

## 2021-03-29 ENCOUNTER — Ambulatory Visit: Payer: Medicare Other | Admitting: Oncology

## 2021-03-29 ENCOUNTER — Other Ambulatory Visit: Payer: Medicare Other

## 2021-03-29 DIAGNOSIS — M545 Low back pain, unspecified: Secondary | ICD-10-CM | POA: Diagnosis not present

## 2021-03-29 DIAGNOSIS — J9611 Chronic respiratory failure with hypoxia: Secondary | ICD-10-CM | POA: Diagnosis not present

## 2021-03-29 DIAGNOSIS — G8929 Other chronic pain: Secondary | ICD-10-CM | POA: Diagnosis not present

## 2021-03-30 DIAGNOSIS — D509 Iron deficiency anemia, unspecified: Secondary | ICD-10-CM | POA: Diagnosis not present

## 2021-03-30 DIAGNOSIS — F419 Anxiety disorder, unspecified: Secondary | ICD-10-CM | POA: Diagnosis not present

## 2021-03-30 DIAGNOSIS — F32A Depression, unspecified: Secondary | ICD-10-CM | POA: Diagnosis not present

## 2021-03-30 DIAGNOSIS — J449 Chronic obstructive pulmonary disease, unspecified: Secondary | ICD-10-CM | POA: Diagnosis not present

## 2021-03-30 DIAGNOSIS — I129 Hypertensive chronic kidney disease with stage 1 through stage 4 chronic kidney disease, or unspecified chronic kidney disease: Secondary | ICD-10-CM | POA: Diagnosis not present

## 2021-03-30 DIAGNOSIS — J9691 Respiratory failure, unspecified with hypoxia: Secondary | ICD-10-CM | POA: Diagnosis not present

## 2021-04-02 ENCOUNTER — Telehealth: Payer: Self-pay

## 2021-04-02 DIAGNOSIS — F419 Anxiety disorder, unspecified: Secondary | ICD-10-CM | POA: Diagnosis not present

## 2021-04-02 DIAGNOSIS — F32A Depression, unspecified: Secondary | ICD-10-CM | POA: Diagnosis not present

## 2021-04-02 DIAGNOSIS — D509 Iron deficiency anemia, unspecified: Secondary | ICD-10-CM | POA: Diagnosis not present

## 2021-04-02 DIAGNOSIS — J9691 Respiratory failure, unspecified with hypoxia: Secondary | ICD-10-CM | POA: Diagnosis not present

## 2021-04-02 DIAGNOSIS — J449 Chronic obstructive pulmonary disease, unspecified: Secondary | ICD-10-CM | POA: Diagnosis not present

## 2021-04-02 DIAGNOSIS — I129 Hypertensive chronic kidney disease with stage 1 through stage 4 chronic kidney disease, or unspecified chronic kidney disease: Secondary | ICD-10-CM | POA: Diagnosis not present

## 2021-04-02 NOTE — Telephone Encounter (Signed)
Copied from [place] [id number]. Topic: General - Other ?>> Apr 02, 2021 12:08 PM Leward Quan A wrote: ?Reason for CRM: Len Blal[facility] patient daughter called in and cancelled appointment for [facility] asking if it can be done by phone since patient is in no shape to come to  the office for a visit, please call her to set a time can be reached at  [phone] ?

## 2021-04-04 DIAGNOSIS — D509 Iron deficiency anemia, unspecified: Secondary | ICD-10-CM | POA: Diagnosis not present

## 2021-04-04 DIAGNOSIS — J449 Chronic obstructive pulmonary disease, unspecified: Secondary | ICD-10-CM | POA: Diagnosis not present

## 2021-04-04 DIAGNOSIS — J9691 Respiratory failure, unspecified with hypoxia: Secondary | ICD-10-CM | POA: Diagnosis not present

## 2021-04-04 DIAGNOSIS — F419 Anxiety disorder, unspecified: Secondary | ICD-10-CM | POA: Diagnosis not present

## 2021-04-04 DIAGNOSIS — F32A Depression, unspecified: Secondary | ICD-10-CM | POA: Diagnosis not present

## 2021-04-04 DIAGNOSIS — I129 Hypertensive chronic kidney disease with stage 1 through stage 4 chronic kidney disease, or unspecified chronic kidney disease: Secondary | ICD-10-CM | POA: Diagnosis not present

## 2021-04-06 ENCOUNTER — Ambulatory Visit: Payer: TRICARE For Life (TFL) | Admitting: Family Medicine

## 2021-04-07 DIAGNOSIS — F32A Depression, unspecified: Secondary | ICD-10-CM | POA: Diagnosis not present

## 2021-04-07 DIAGNOSIS — Z6820 Body mass index (BMI) 20.0-20.9, adult: Secondary | ICD-10-CM | POA: Diagnosis not present

## 2021-04-07 DIAGNOSIS — M810 Age-related osteoporosis without current pathological fracture: Secondary | ICD-10-CM | POA: Diagnosis not present

## 2021-04-07 DIAGNOSIS — Z7722 Contact with and (suspected) exposure to environmental tobacco smoke (acute) (chronic): Secondary | ICD-10-CM | POA: Diagnosis not present

## 2021-04-07 DIAGNOSIS — E039 Hypothyroidism, unspecified: Secondary | ICD-10-CM | POA: Diagnosis not present

## 2021-04-07 DIAGNOSIS — K219 Gastro-esophageal reflux disease without esophagitis: Secondary | ICD-10-CM | POA: Diagnosis not present

## 2021-04-07 DIAGNOSIS — D509 Iron deficiency anemia, unspecified: Secondary | ICD-10-CM | POA: Diagnosis not present

## 2021-04-07 DIAGNOSIS — N1832 Chronic kidney disease, stage 3b: Secondary | ICD-10-CM | POA: Diagnosis not present

## 2021-04-07 DIAGNOSIS — Z8701 Personal history of pneumonia (recurrent): Secondary | ICD-10-CM | POA: Diagnosis not present

## 2021-04-07 DIAGNOSIS — Z9981 Dependence on supplemental oxygen: Secondary | ICD-10-CM | POA: Diagnosis not present

## 2021-04-07 DIAGNOSIS — Z87891 Personal history of nicotine dependence: Secondary | ICD-10-CM | POA: Diagnosis not present

## 2021-04-07 DIAGNOSIS — J9691 Respiratory failure, unspecified with hypoxia: Secondary | ICD-10-CM | POA: Diagnosis not present

## 2021-04-07 DIAGNOSIS — J309 Allergic rhinitis, unspecified: Secondary | ICD-10-CM | POA: Diagnosis not present

## 2021-04-07 DIAGNOSIS — M419 Scoliosis, unspecified: Secondary | ICD-10-CM | POA: Diagnosis not present

## 2021-04-07 DIAGNOSIS — E785 Hyperlipidemia, unspecified: Secondary | ICD-10-CM | POA: Diagnosis not present

## 2021-04-07 DIAGNOSIS — F419 Anxiety disorder, unspecified: Secondary | ICD-10-CM | POA: Diagnosis not present

## 2021-04-07 DIAGNOSIS — R627 Adult failure to thrive: Secondary | ICD-10-CM | POA: Diagnosis not present

## 2021-04-07 DIAGNOSIS — I129 Hypertensive chronic kidney disease with stage 1 through stage 4 chronic kidney disease, or unspecified chronic kidney disease: Secondary | ICD-10-CM | POA: Diagnosis not present

## 2021-04-07 DIAGNOSIS — R634 Abnormal weight loss: Secondary | ICD-10-CM | POA: Diagnosis not present

## 2021-04-07 DIAGNOSIS — J449 Chronic obstructive pulmonary disease, unspecified: Secondary | ICD-10-CM | POA: Diagnosis not present

## 2021-04-09 DIAGNOSIS — F419 Anxiety disorder, unspecified: Secondary | ICD-10-CM | POA: Diagnosis not present

## 2021-04-09 DIAGNOSIS — F32A Depression, unspecified: Secondary | ICD-10-CM | POA: Diagnosis not present

## 2021-04-09 DIAGNOSIS — D509 Iron deficiency anemia, unspecified: Secondary | ICD-10-CM | POA: Diagnosis not present

## 2021-04-09 DIAGNOSIS — I129 Hypertensive chronic kidney disease with stage 1 through stage 4 chronic kidney disease, or unspecified chronic kidney disease: Secondary | ICD-10-CM | POA: Diagnosis not present

## 2021-04-09 DIAGNOSIS — J9691 Respiratory failure, unspecified with hypoxia: Secondary | ICD-10-CM | POA: Diagnosis not present

## 2021-04-09 DIAGNOSIS — J449 Chronic obstructive pulmonary disease, unspecified: Secondary | ICD-10-CM | POA: Diagnosis not present

## 2021-04-10 DIAGNOSIS — J449 Chronic obstructive pulmonary disease, unspecified: Secondary | ICD-10-CM | POA: Diagnosis not present

## 2021-04-10 DIAGNOSIS — I129 Hypertensive chronic kidney disease with stage 1 through stage 4 chronic kidney disease, or unspecified chronic kidney disease: Secondary | ICD-10-CM | POA: Diagnosis not present

## 2021-04-10 DIAGNOSIS — F419 Anxiety disorder, unspecified: Secondary | ICD-10-CM | POA: Diagnosis not present

## 2021-04-10 DIAGNOSIS — J9691 Respiratory failure, unspecified with hypoxia: Secondary | ICD-10-CM | POA: Diagnosis not present

## 2021-04-10 DIAGNOSIS — F32A Depression, unspecified: Secondary | ICD-10-CM | POA: Diagnosis not present

## 2021-04-10 DIAGNOSIS — D509 Iron deficiency anemia, unspecified: Secondary | ICD-10-CM | POA: Diagnosis not present

## 2021-04-11 DIAGNOSIS — J449 Chronic obstructive pulmonary disease, unspecified: Secondary | ICD-10-CM | POA: Diagnosis not present

## 2021-04-11 DIAGNOSIS — D509 Iron deficiency anemia, unspecified: Secondary | ICD-10-CM | POA: Diagnosis not present

## 2021-04-11 DIAGNOSIS — I129 Hypertensive chronic kidney disease with stage 1 through stage 4 chronic kidney disease, or unspecified chronic kidney disease: Secondary | ICD-10-CM | POA: Diagnosis not present

## 2021-04-11 DIAGNOSIS — J9691 Respiratory failure, unspecified with hypoxia: Secondary | ICD-10-CM | POA: Diagnosis not present

## 2021-04-11 DIAGNOSIS — F419 Anxiety disorder, unspecified: Secondary | ICD-10-CM | POA: Diagnosis not present

## 2021-04-11 DIAGNOSIS — F32A Depression, unspecified: Secondary | ICD-10-CM | POA: Diagnosis not present

## 2021-04-12 DIAGNOSIS — F419 Anxiety disorder, unspecified: Secondary | ICD-10-CM | POA: Diagnosis not present

## 2021-04-12 DIAGNOSIS — J449 Chronic obstructive pulmonary disease, unspecified: Secondary | ICD-10-CM | POA: Diagnosis not present

## 2021-04-12 DIAGNOSIS — J9691 Respiratory failure, unspecified with hypoxia: Secondary | ICD-10-CM | POA: Diagnosis not present

## 2021-04-12 DIAGNOSIS — F32A Depression, unspecified: Secondary | ICD-10-CM | POA: Diagnosis not present

## 2021-04-12 DIAGNOSIS — I129 Hypertensive chronic kidney disease with stage 1 through stage 4 chronic kidney disease, or unspecified chronic kidney disease: Secondary | ICD-10-CM | POA: Diagnosis not present

## 2021-04-12 DIAGNOSIS — D509 Iron deficiency anemia, unspecified: Secondary | ICD-10-CM | POA: Diagnosis not present

## 2021-04-16 DIAGNOSIS — J9691 Respiratory failure, unspecified with hypoxia: Secondary | ICD-10-CM | POA: Diagnosis not present

## 2021-04-16 DIAGNOSIS — L603 Nail dystrophy: Secondary | ICD-10-CM | POA: Diagnosis not present

## 2021-04-16 DIAGNOSIS — B351 Tinea unguium: Secondary | ICD-10-CM | POA: Diagnosis not present

## 2021-04-16 DIAGNOSIS — F32A Depression, unspecified: Secondary | ICD-10-CM | POA: Diagnosis not present

## 2021-04-16 DIAGNOSIS — I129 Hypertensive chronic kidney disease with stage 1 through stage 4 chronic kidney disease, or unspecified chronic kidney disease: Secondary | ICD-10-CM | POA: Diagnosis not present

## 2021-04-16 DIAGNOSIS — I7091 Generalized atherosclerosis: Secondary | ICD-10-CM | POA: Diagnosis not present

## 2021-04-16 DIAGNOSIS — J449 Chronic obstructive pulmonary disease, unspecified: Secondary | ICD-10-CM | POA: Diagnosis not present

## 2021-04-16 DIAGNOSIS — F419 Anxiety disorder, unspecified: Secondary | ICD-10-CM | POA: Diagnosis not present

## 2021-04-16 DIAGNOSIS — D509 Iron deficiency anemia, unspecified: Secondary | ICD-10-CM | POA: Diagnosis not present

## 2021-04-18 DIAGNOSIS — F419 Anxiety disorder, unspecified: Secondary | ICD-10-CM | POA: Diagnosis not present

## 2021-04-18 DIAGNOSIS — I129 Hypertensive chronic kidney disease with stage 1 through stage 4 chronic kidney disease, or unspecified chronic kidney disease: Secondary | ICD-10-CM | POA: Diagnosis not present

## 2021-04-18 DIAGNOSIS — J449 Chronic obstructive pulmonary disease, unspecified: Secondary | ICD-10-CM | POA: Diagnosis not present

## 2021-04-18 DIAGNOSIS — D509 Iron deficiency anemia, unspecified: Secondary | ICD-10-CM | POA: Diagnosis not present

## 2021-04-18 DIAGNOSIS — F32A Depression, unspecified: Secondary | ICD-10-CM | POA: Diagnosis not present

## 2021-04-18 DIAGNOSIS — J9691 Respiratory failure, unspecified with hypoxia: Secondary | ICD-10-CM | POA: Diagnosis not present

## 2021-04-19 DIAGNOSIS — E440 Moderate protein-calorie malnutrition: Secondary | ICD-10-CM | POA: Diagnosis not present

## 2021-04-19 DIAGNOSIS — J449 Chronic obstructive pulmonary disease, unspecified: Secondary | ICD-10-CM | POA: Diagnosis not present

## 2021-04-19 DIAGNOSIS — D509 Iron deficiency anemia, unspecified: Secondary | ICD-10-CM | POA: Diagnosis not present

## 2021-04-19 DIAGNOSIS — F32A Depression, unspecified: Secondary | ICD-10-CM | POA: Diagnosis not present

## 2021-04-19 DIAGNOSIS — M159 Polyosteoarthritis, unspecified: Secondary | ICD-10-CM | POA: Diagnosis not present

## 2021-04-19 DIAGNOSIS — F419 Anxiety disorder, unspecified: Secondary | ICD-10-CM | POA: Diagnosis not present

## 2021-04-19 DIAGNOSIS — I129 Hypertensive chronic kidney disease with stage 1 through stage 4 chronic kidney disease, or unspecified chronic kidney disease: Secondary | ICD-10-CM | POA: Diagnosis not present

## 2021-04-19 DIAGNOSIS — J9691 Respiratory failure, unspecified with hypoxia: Secondary | ICD-10-CM | POA: Diagnosis not present

## 2021-04-19 DIAGNOSIS — J9611 Chronic respiratory failure with hypoxia: Secondary | ICD-10-CM | POA: Diagnosis not present

## 2021-04-23 DIAGNOSIS — D509 Iron deficiency anemia, unspecified: Secondary | ICD-10-CM | POA: Diagnosis not present

## 2021-04-23 DIAGNOSIS — J9691 Respiratory failure, unspecified with hypoxia: Secondary | ICD-10-CM | POA: Diagnosis not present

## 2021-04-23 DIAGNOSIS — F419 Anxiety disorder, unspecified: Secondary | ICD-10-CM | POA: Diagnosis not present

## 2021-04-23 DIAGNOSIS — I129 Hypertensive chronic kidney disease with stage 1 through stage 4 chronic kidney disease, or unspecified chronic kidney disease: Secondary | ICD-10-CM | POA: Diagnosis not present

## 2021-04-23 DIAGNOSIS — J449 Chronic obstructive pulmonary disease, unspecified: Secondary | ICD-10-CM | POA: Diagnosis not present

## 2021-04-23 DIAGNOSIS — F32A Depression, unspecified: Secondary | ICD-10-CM | POA: Diagnosis not present

## 2021-04-24 DIAGNOSIS — F32A Depression, unspecified: Secondary | ICD-10-CM | POA: Diagnosis not present

## 2021-04-24 DIAGNOSIS — D509 Iron deficiency anemia, unspecified: Secondary | ICD-10-CM | POA: Diagnosis not present

## 2021-04-24 DIAGNOSIS — I129 Hypertensive chronic kidney disease with stage 1 through stage 4 chronic kidney disease, or unspecified chronic kidney disease: Secondary | ICD-10-CM | POA: Diagnosis not present

## 2021-04-24 DIAGNOSIS — F419 Anxiety disorder, unspecified: Secondary | ICD-10-CM | POA: Diagnosis not present

## 2021-04-24 DIAGNOSIS — J449 Chronic obstructive pulmonary disease, unspecified: Secondary | ICD-10-CM | POA: Diagnosis not present

## 2021-04-24 DIAGNOSIS — J9691 Respiratory failure, unspecified with hypoxia: Secondary | ICD-10-CM | POA: Diagnosis not present

## 2021-04-25 DIAGNOSIS — J9691 Respiratory failure, unspecified with hypoxia: Secondary | ICD-10-CM | POA: Diagnosis not present

## 2021-04-25 DIAGNOSIS — F419 Anxiety disorder, unspecified: Secondary | ICD-10-CM | POA: Diagnosis not present

## 2021-04-25 DIAGNOSIS — J449 Chronic obstructive pulmonary disease, unspecified: Secondary | ICD-10-CM | POA: Diagnosis not present

## 2021-04-25 DIAGNOSIS — F32A Depression, unspecified: Secondary | ICD-10-CM | POA: Diagnosis not present

## 2021-04-25 DIAGNOSIS — D509 Iron deficiency anemia, unspecified: Secondary | ICD-10-CM | POA: Diagnosis not present

## 2021-04-25 DIAGNOSIS — I129 Hypertensive chronic kidney disease with stage 1 through stage 4 chronic kidney disease, or unspecified chronic kidney disease: Secondary | ICD-10-CM | POA: Diagnosis not present

## 2021-04-27 DIAGNOSIS — J9691 Respiratory failure, unspecified with hypoxia: Secondary | ICD-10-CM | POA: Diagnosis not present

## 2021-04-27 DIAGNOSIS — I129 Hypertensive chronic kidney disease with stage 1 through stage 4 chronic kidney disease, or unspecified chronic kidney disease: Secondary | ICD-10-CM | POA: Diagnosis not present

## 2021-04-27 DIAGNOSIS — F419 Anxiety disorder, unspecified: Secondary | ICD-10-CM | POA: Diagnosis not present

## 2021-04-27 DIAGNOSIS — D509 Iron deficiency anemia, unspecified: Secondary | ICD-10-CM | POA: Diagnosis not present

## 2021-04-27 DIAGNOSIS — J449 Chronic obstructive pulmonary disease, unspecified: Secondary | ICD-10-CM | POA: Diagnosis not present

## 2021-04-27 DIAGNOSIS — F32A Depression, unspecified: Secondary | ICD-10-CM | POA: Diagnosis not present

## 2021-04-30 DIAGNOSIS — F419 Anxiety disorder, unspecified: Secondary | ICD-10-CM | POA: Diagnosis not present

## 2021-04-30 DIAGNOSIS — J449 Chronic obstructive pulmonary disease, unspecified: Secondary | ICD-10-CM | POA: Diagnosis not present

## 2021-04-30 DIAGNOSIS — D509 Iron deficiency anemia, unspecified: Secondary | ICD-10-CM | POA: Diagnosis not present

## 2021-04-30 DIAGNOSIS — I129 Hypertensive chronic kidney disease with stage 1 through stage 4 chronic kidney disease, or unspecified chronic kidney disease: Secondary | ICD-10-CM | POA: Diagnosis not present

## 2021-04-30 DIAGNOSIS — F32A Depression, unspecified: Secondary | ICD-10-CM | POA: Diagnosis not present

## 2021-04-30 DIAGNOSIS — J9691 Respiratory failure, unspecified with hypoxia: Secondary | ICD-10-CM | POA: Diagnosis not present

## 2021-05-01 DIAGNOSIS — I129 Hypertensive chronic kidney disease with stage 1 through stage 4 chronic kidney disease, or unspecified chronic kidney disease: Secondary | ICD-10-CM | POA: Diagnosis not present

## 2021-05-01 DIAGNOSIS — D509 Iron deficiency anemia, unspecified: Secondary | ICD-10-CM | POA: Diagnosis not present

## 2021-05-01 DIAGNOSIS — J449 Chronic obstructive pulmonary disease, unspecified: Secondary | ICD-10-CM | POA: Diagnosis not present

## 2021-05-01 DIAGNOSIS — F419 Anxiety disorder, unspecified: Secondary | ICD-10-CM | POA: Diagnosis not present

## 2021-05-01 DIAGNOSIS — F32A Depression, unspecified: Secondary | ICD-10-CM | POA: Diagnosis not present

## 2021-05-01 DIAGNOSIS — J9691 Respiratory failure, unspecified with hypoxia: Secondary | ICD-10-CM | POA: Diagnosis not present

## 2021-05-02 DIAGNOSIS — F419 Anxiety disorder, unspecified: Secondary | ICD-10-CM | POA: Diagnosis not present

## 2021-05-02 DIAGNOSIS — F32A Depression, unspecified: Secondary | ICD-10-CM | POA: Diagnosis not present

## 2021-05-02 DIAGNOSIS — I129 Hypertensive chronic kidney disease with stage 1 through stage 4 chronic kidney disease, or unspecified chronic kidney disease: Secondary | ICD-10-CM | POA: Diagnosis not present

## 2021-05-02 DIAGNOSIS — D509 Iron deficiency anemia, unspecified: Secondary | ICD-10-CM | POA: Diagnosis not present

## 2021-05-02 DIAGNOSIS — J449 Chronic obstructive pulmonary disease, unspecified: Secondary | ICD-10-CM | POA: Diagnosis not present

## 2021-05-02 DIAGNOSIS — J9691 Respiratory failure, unspecified with hypoxia: Secondary | ICD-10-CM | POA: Diagnosis not present

## 2021-05-07 DIAGNOSIS — J9691 Respiratory failure, unspecified with hypoxia: Secondary | ICD-10-CM | POA: Diagnosis not present

## 2021-05-07 DIAGNOSIS — M419 Scoliosis, unspecified: Secondary | ICD-10-CM | POA: Diagnosis not present

## 2021-05-07 DIAGNOSIS — N1832 Chronic kidney disease, stage 3b: Secondary | ICD-10-CM | POA: Diagnosis not present

## 2021-05-07 DIAGNOSIS — M810 Age-related osteoporosis without current pathological fracture: Secondary | ICD-10-CM | POA: Diagnosis not present

## 2021-05-07 DIAGNOSIS — F32A Depression, unspecified: Secondary | ICD-10-CM | POA: Diagnosis not present

## 2021-05-07 DIAGNOSIS — K219 Gastro-esophageal reflux disease without esophagitis: Secondary | ICD-10-CM | POA: Diagnosis not present

## 2021-05-07 DIAGNOSIS — Z6820 Body mass index (BMI) 20.0-20.9, adult: Secondary | ICD-10-CM | POA: Diagnosis not present

## 2021-05-07 DIAGNOSIS — D509 Iron deficiency anemia, unspecified: Secondary | ICD-10-CM | POA: Diagnosis not present

## 2021-05-07 DIAGNOSIS — Z7722 Contact with and (suspected) exposure to environmental tobacco smoke (acute) (chronic): Secondary | ICD-10-CM | POA: Diagnosis not present

## 2021-05-07 DIAGNOSIS — R634 Abnormal weight loss: Secondary | ICD-10-CM | POA: Diagnosis not present

## 2021-05-07 DIAGNOSIS — R627 Adult failure to thrive: Secondary | ICD-10-CM | POA: Diagnosis not present

## 2021-05-07 DIAGNOSIS — Z87891 Personal history of nicotine dependence: Secondary | ICD-10-CM | POA: Diagnosis not present

## 2021-05-07 DIAGNOSIS — Z9981 Dependence on supplemental oxygen: Secondary | ICD-10-CM | POA: Diagnosis not present

## 2021-05-07 DIAGNOSIS — J449 Chronic obstructive pulmonary disease, unspecified: Secondary | ICD-10-CM | POA: Diagnosis not present

## 2021-05-07 DIAGNOSIS — Z8701 Personal history of pneumonia (recurrent): Secondary | ICD-10-CM | POA: Diagnosis not present

## 2021-05-07 DIAGNOSIS — E785 Hyperlipidemia, unspecified: Secondary | ICD-10-CM | POA: Diagnosis not present

## 2021-05-07 DIAGNOSIS — J309 Allergic rhinitis, unspecified: Secondary | ICD-10-CM | POA: Diagnosis not present

## 2021-05-07 DIAGNOSIS — I129 Hypertensive chronic kidney disease with stage 1 through stage 4 chronic kidney disease, or unspecified chronic kidney disease: Secondary | ICD-10-CM | POA: Diagnosis not present

## 2021-05-07 DIAGNOSIS — F419 Anxiety disorder, unspecified: Secondary | ICD-10-CM | POA: Diagnosis not present

## 2021-05-07 DIAGNOSIS — E039 Hypothyroidism, unspecified: Secondary | ICD-10-CM | POA: Diagnosis not present

## 2021-05-09 DIAGNOSIS — F419 Anxiety disorder, unspecified: Secondary | ICD-10-CM | POA: Diagnosis not present

## 2021-05-09 DIAGNOSIS — D509 Iron deficiency anemia, unspecified: Secondary | ICD-10-CM | POA: Diagnosis not present

## 2021-05-09 DIAGNOSIS — J449 Chronic obstructive pulmonary disease, unspecified: Secondary | ICD-10-CM | POA: Diagnosis not present

## 2021-05-09 DIAGNOSIS — I129 Hypertensive chronic kidney disease with stage 1 through stage 4 chronic kidney disease, or unspecified chronic kidney disease: Secondary | ICD-10-CM | POA: Diagnosis not present

## 2021-05-09 DIAGNOSIS — F32A Depression, unspecified: Secondary | ICD-10-CM | POA: Diagnosis not present

## 2021-05-09 DIAGNOSIS — J9691 Respiratory failure, unspecified with hypoxia: Secondary | ICD-10-CM | POA: Diagnosis not present

## 2021-05-14 DIAGNOSIS — I129 Hypertensive chronic kidney disease with stage 1 through stage 4 chronic kidney disease, or unspecified chronic kidney disease: Secondary | ICD-10-CM | POA: Diagnosis not present

## 2021-05-14 DIAGNOSIS — J9691 Respiratory failure, unspecified with hypoxia: Secondary | ICD-10-CM | POA: Diagnosis not present

## 2021-05-14 DIAGNOSIS — J449 Chronic obstructive pulmonary disease, unspecified: Secondary | ICD-10-CM | POA: Diagnosis not present

## 2021-05-14 DIAGNOSIS — D509 Iron deficiency anemia, unspecified: Secondary | ICD-10-CM | POA: Diagnosis not present

## 2021-05-14 DIAGNOSIS — F419 Anxiety disorder, unspecified: Secondary | ICD-10-CM | POA: Diagnosis not present

## 2021-05-14 DIAGNOSIS — F32A Depression, unspecified: Secondary | ICD-10-CM | POA: Diagnosis not present

## 2021-05-15 DIAGNOSIS — D509 Iron deficiency anemia, unspecified: Secondary | ICD-10-CM | POA: Diagnosis not present

## 2021-05-15 DIAGNOSIS — F32A Depression, unspecified: Secondary | ICD-10-CM | POA: Diagnosis not present

## 2021-05-15 DIAGNOSIS — J449 Chronic obstructive pulmonary disease, unspecified: Secondary | ICD-10-CM | POA: Diagnosis not present

## 2021-05-15 DIAGNOSIS — J9691 Respiratory failure, unspecified with hypoxia: Secondary | ICD-10-CM | POA: Diagnosis not present

## 2021-05-15 DIAGNOSIS — F419 Anxiety disorder, unspecified: Secondary | ICD-10-CM | POA: Diagnosis not present

## 2021-05-15 DIAGNOSIS — I129 Hypertensive chronic kidney disease with stage 1 through stage 4 chronic kidney disease, or unspecified chronic kidney disease: Secondary | ICD-10-CM | POA: Diagnosis not present

## 2021-05-16 DIAGNOSIS — J9691 Respiratory failure, unspecified with hypoxia: Secondary | ICD-10-CM | POA: Diagnosis not present

## 2021-05-16 DIAGNOSIS — F32A Depression, unspecified: Secondary | ICD-10-CM | POA: Diagnosis not present

## 2021-05-16 DIAGNOSIS — F419 Anxiety disorder, unspecified: Secondary | ICD-10-CM | POA: Diagnosis not present

## 2021-05-16 DIAGNOSIS — J449 Chronic obstructive pulmonary disease, unspecified: Secondary | ICD-10-CM | POA: Diagnosis not present

## 2021-05-16 DIAGNOSIS — I129 Hypertensive chronic kidney disease with stage 1 through stage 4 chronic kidney disease, or unspecified chronic kidney disease: Secondary | ICD-10-CM | POA: Diagnosis not present

## 2021-05-16 DIAGNOSIS — D509 Iron deficiency anemia, unspecified: Secondary | ICD-10-CM | POA: Diagnosis not present

## 2021-05-17 DIAGNOSIS — D509 Iron deficiency anemia, unspecified: Secondary | ICD-10-CM | POA: Diagnosis not present

## 2021-05-17 DIAGNOSIS — F32A Depression, unspecified: Secondary | ICD-10-CM | POA: Diagnosis not present

## 2021-05-17 DIAGNOSIS — F419 Anxiety disorder, unspecified: Secondary | ICD-10-CM | POA: Diagnosis not present

## 2021-05-17 DIAGNOSIS — I129 Hypertensive chronic kidney disease with stage 1 through stage 4 chronic kidney disease, or unspecified chronic kidney disease: Secondary | ICD-10-CM | POA: Diagnosis not present

## 2021-05-17 DIAGNOSIS — J449 Chronic obstructive pulmonary disease, unspecified: Secondary | ICD-10-CM | POA: Diagnosis not present

## 2021-05-17 DIAGNOSIS — J9691 Respiratory failure, unspecified with hypoxia: Secondary | ICD-10-CM | POA: Diagnosis not present

## 2021-05-18 DIAGNOSIS — F32A Depression, unspecified: Secondary | ICD-10-CM | POA: Diagnosis not present

## 2021-05-18 DIAGNOSIS — D509 Iron deficiency anemia, unspecified: Secondary | ICD-10-CM | POA: Diagnosis not present

## 2021-05-18 DIAGNOSIS — J449 Chronic obstructive pulmonary disease, unspecified: Secondary | ICD-10-CM | POA: Diagnosis not present

## 2021-05-18 DIAGNOSIS — J9691 Respiratory failure, unspecified with hypoxia: Secondary | ICD-10-CM | POA: Diagnosis not present

## 2021-05-18 DIAGNOSIS — I129 Hypertensive chronic kidney disease with stage 1 through stage 4 chronic kidney disease, or unspecified chronic kidney disease: Secondary | ICD-10-CM | POA: Diagnosis not present

## 2021-05-18 DIAGNOSIS — F419 Anxiety disorder, unspecified: Secondary | ICD-10-CM | POA: Diagnosis not present

## 2021-05-21 ENCOUNTER — Telehealth: Payer: Self-pay | Admitting: Psychiatry

## 2021-05-21 DIAGNOSIS — F419 Anxiety disorder, unspecified: Secondary | ICD-10-CM | POA: Diagnosis not present

## 2021-05-21 DIAGNOSIS — D509 Iron deficiency anemia, unspecified: Secondary | ICD-10-CM | POA: Diagnosis not present

## 2021-05-21 DIAGNOSIS — J9691 Respiratory failure, unspecified with hypoxia: Secondary | ICD-10-CM | POA: Diagnosis not present

## 2021-05-21 DIAGNOSIS — I129 Hypertensive chronic kidney disease with stage 1 through stage 4 chronic kidney disease, or unspecified chronic kidney disease: Secondary | ICD-10-CM | POA: Diagnosis not present

## 2021-05-21 DIAGNOSIS — J449 Chronic obstructive pulmonary disease, unspecified: Secondary | ICD-10-CM | POA: Diagnosis not present

## 2021-05-21 DIAGNOSIS — F32A Depression, unspecified: Secondary | ICD-10-CM | POA: Diagnosis not present

## 2021-05-21 NOTE — Telephone Encounter (Signed)
Daughter Sandra Brown called to cancel appointment for 05/22/2021 - patient is now in hospice care. No need for continued appointments so no rescheduling. ?

## 2021-05-21 NOTE — Telephone Encounter (Signed)
Thank you , I did speak to daughter after she went to hospice care. ?

## 2021-05-23 DIAGNOSIS — F419 Anxiety disorder, unspecified: Secondary | ICD-10-CM | POA: Diagnosis not present

## 2021-05-23 DIAGNOSIS — I129 Hypertensive chronic kidney disease with stage 1 through stage 4 chronic kidney disease, or unspecified chronic kidney disease: Secondary | ICD-10-CM | POA: Diagnosis not present

## 2021-05-23 DIAGNOSIS — J449 Chronic obstructive pulmonary disease, unspecified: Secondary | ICD-10-CM | POA: Diagnosis not present

## 2021-05-23 DIAGNOSIS — J9691 Respiratory failure, unspecified with hypoxia: Secondary | ICD-10-CM | POA: Diagnosis not present

## 2021-05-23 DIAGNOSIS — D509 Iron deficiency anemia, unspecified: Secondary | ICD-10-CM | POA: Diagnosis not present

## 2021-05-23 DIAGNOSIS — F32A Depression, unspecified: Secondary | ICD-10-CM | POA: Diagnosis not present

## 2021-05-24 DIAGNOSIS — F32A Depression, unspecified: Secondary | ICD-10-CM | POA: Diagnosis not present

## 2021-05-24 DIAGNOSIS — D509 Iron deficiency anemia, unspecified: Secondary | ICD-10-CM | POA: Diagnosis not present

## 2021-05-24 DIAGNOSIS — I129 Hypertensive chronic kidney disease with stage 1 through stage 4 chronic kidney disease, or unspecified chronic kidney disease: Secondary | ICD-10-CM | POA: Diagnosis not present

## 2021-05-24 DIAGNOSIS — F419 Anxiety disorder, unspecified: Secondary | ICD-10-CM | POA: Diagnosis not present

## 2021-05-24 DIAGNOSIS — J9611 Chronic respiratory failure with hypoxia: Secondary | ICD-10-CM | POA: Diagnosis not present

## 2021-05-24 DIAGNOSIS — J9691 Respiratory failure, unspecified with hypoxia: Secondary | ICD-10-CM | POA: Diagnosis not present

## 2021-05-24 DIAGNOSIS — J449 Chronic obstructive pulmonary disease, unspecified: Secondary | ICD-10-CM | POA: Diagnosis not present

## 2021-05-25 DIAGNOSIS — I129 Hypertensive chronic kidney disease with stage 1 through stage 4 chronic kidney disease, or unspecified chronic kidney disease: Secondary | ICD-10-CM | POA: Diagnosis not present

## 2021-05-25 DIAGNOSIS — F419 Anxiety disorder, unspecified: Secondary | ICD-10-CM | POA: Diagnosis not present

## 2021-05-25 DIAGNOSIS — D509 Iron deficiency anemia, unspecified: Secondary | ICD-10-CM | POA: Diagnosis not present

## 2021-05-25 DIAGNOSIS — J449 Chronic obstructive pulmonary disease, unspecified: Secondary | ICD-10-CM | POA: Diagnosis not present

## 2021-05-25 DIAGNOSIS — F32A Depression, unspecified: Secondary | ICD-10-CM | POA: Diagnosis not present

## 2021-05-25 DIAGNOSIS — J9691 Respiratory failure, unspecified with hypoxia: Secondary | ICD-10-CM | POA: Diagnosis not present

## 2021-05-26 DIAGNOSIS — D509 Iron deficiency anemia, unspecified: Secondary | ICD-10-CM | POA: Diagnosis not present

## 2021-05-26 DIAGNOSIS — J9691 Respiratory failure, unspecified with hypoxia: Secondary | ICD-10-CM | POA: Diagnosis not present

## 2021-05-26 DIAGNOSIS — J449 Chronic obstructive pulmonary disease, unspecified: Secondary | ICD-10-CM | POA: Diagnosis not present

## 2021-05-26 DIAGNOSIS — F419 Anxiety disorder, unspecified: Secondary | ICD-10-CM | POA: Diagnosis not present

## 2021-05-26 DIAGNOSIS — F32A Depression, unspecified: Secondary | ICD-10-CM | POA: Diagnosis not present

## 2021-05-26 DIAGNOSIS — I129 Hypertensive chronic kidney disease with stage 1 through stage 4 chronic kidney disease, or unspecified chronic kidney disease: Secondary | ICD-10-CM | POA: Diagnosis not present

## 2021-05-27 DIAGNOSIS — J449 Chronic obstructive pulmonary disease, unspecified: Secondary | ICD-10-CM | POA: Diagnosis not present

## 2021-05-27 DIAGNOSIS — I129 Hypertensive chronic kidney disease with stage 1 through stage 4 chronic kidney disease, or unspecified chronic kidney disease: Secondary | ICD-10-CM | POA: Diagnosis not present

## 2021-05-27 DIAGNOSIS — F419 Anxiety disorder, unspecified: Secondary | ICD-10-CM | POA: Diagnosis not present

## 2021-05-27 DIAGNOSIS — J9691 Respiratory failure, unspecified with hypoxia: Secondary | ICD-10-CM | POA: Diagnosis not present

## 2021-05-27 DIAGNOSIS — F32A Depression, unspecified: Secondary | ICD-10-CM | POA: Diagnosis not present

## 2021-05-27 DIAGNOSIS — D509 Iron deficiency anemia, unspecified: Secondary | ICD-10-CM | POA: Diagnosis not present

## 2021-05-28 DIAGNOSIS — F419 Anxiety disorder, unspecified: Secondary | ICD-10-CM | POA: Diagnosis not present

## 2021-05-28 DIAGNOSIS — D509 Iron deficiency anemia, unspecified: Secondary | ICD-10-CM | POA: Diagnosis not present

## 2021-05-28 DIAGNOSIS — J9691 Respiratory failure, unspecified with hypoxia: Secondary | ICD-10-CM | POA: Diagnosis not present

## 2021-05-28 DIAGNOSIS — F32A Depression, unspecified: Secondary | ICD-10-CM | POA: Diagnosis not present

## 2021-05-28 DIAGNOSIS — I129 Hypertensive chronic kidney disease with stage 1 through stage 4 chronic kidney disease, or unspecified chronic kidney disease: Secondary | ICD-10-CM | POA: Diagnosis not present

## 2021-05-28 DIAGNOSIS — J449 Chronic obstructive pulmonary disease, unspecified: Secondary | ICD-10-CM | POA: Diagnosis not present

## 2021-05-29 DIAGNOSIS — D509 Iron deficiency anemia, unspecified: Secondary | ICD-10-CM | POA: Diagnosis not present

## 2021-05-29 DIAGNOSIS — I129 Hypertensive chronic kidney disease with stage 1 through stage 4 chronic kidney disease, or unspecified chronic kidney disease: Secondary | ICD-10-CM | POA: Diagnosis not present

## 2021-05-29 DIAGNOSIS — F419 Anxiety disorder, unspecified: Secondary | ICD-10-CM | POA: Diagnosis not present

## 2021-05-29 DIAGNOSIS — J9691 Respiratory failure, unspecified with hypoxia: Secondary | ICD-10-CM | POA: Diagnosis not present

## 2021-05-29 DIAGNOSIS — F32A Depression, unspecified: Secondary | ICD-10-CM | POA: Diagnosis not present

## 2021-05-29 DIAGNOSIS — J449 Chronic obstructive pulmonary disease, unspecified: Secondary | ICD-10-CM | POA: Diagnosis not present

## 2021-05-30 DIAGNOSIS — J9691 Respiratory failure, unspecified with hypoxia: Secondary | ICD-10-CM | POA: Diagnosis not present

## 2021-05-30 DIAGNOSIS — I129 Hypertensive chronic kidney disease with stage 1 through stage 4 chronic kidney disease, or unspecified chronic kidney disease: Secondary | ICD-10-CM | POA: Diagnosis not present

## 2021-05-30 DIAGNOSIS — D509 Iron deficiency anemia, unspecified: Secondary | ICD-10-CM | POA: Diagnosis not present

## 2021-05-30 DIAGNOSIS — F32A Depression, unspecified: Secondary | ICD-10-CM | POA: Diagnosis not present

## 2021-05-30 DIAGNOSIS — J449 Chronic obstructive pulmonary disease, unspecified: Secondary | ICD-10-CM | POA: Diagnosis not present

## 2021-05-30 DIAGNOSIS — F419 Anxiety disorder, unspecified: Secondary | ICD-10-CM | POA: Diagnosis not present

## 2021-06-01 DIAGNOSIS — F32A Depression, unspecified: Secondary | ICD-10-CM | POA: Diagnosis not present

## 2021-06-01 DIAGNOSIS — D509 Iron deficiency anemia, unspecified: Secondary | ICD-10-CM | POA: Diagnosis not present

## 2021-06-01 DIAGNOSIS — I129 Hypertensive chronic kidney disease with stage 1 through stage 4 chronic kidney disease, or unspecified chronic kidney disease: Secondary | ICD-10-CM | POA: Diagnosis not present

## 2021-06-01 DIAGNOSIS — F419 Anxiety disorder, unspecified: Secondary | ICD-10-CM | POA: Diagnosis not present

## 2021-06-01 DIAGNOSIS — J449 Chronic obstructive pulmonary disease, unspecified: Secondary | ICD-10-CM | POA: Diagnosis not present

## 2021-06-01 DIAGNOSIS — J9691 Respiratory failure, unspecified with hypoxia: Secondary | ICD-10-CM | POA: Diagnosis not present

## 2021-06-04 DIAGNOSIS — F419 Anxiety disorder, unspecified: Secondary | ICD-10-CM | POA: Diagnosis not present

## 2021-06-04 DIAGNOSIS — F32A Depression, unspecified: Secondary | ICD-10-CM | POA: Diagnosis not present

## 2021-06-04 DIAGNOSIS — J449 Chronic obstructive pulmonary disease, unspecified: Secondary | ICD-10-CM | POA: Diagnosis not present

## 2021-06-04 DIAGNOSIS — D509 Iron deficiency anemia, unspecified: Secondary | ICD-10-CM | POA: Diagnosis not present

## 2021-06-04 DIAGNOSIS — J9691 Respiratory failure, unspecified with hypoxia: Secondary | ICD-10-CM | POA: Diagnosis not present

## 2021-06-04 DIAGNOSIS — I129 Hypertensive chronic kidney disease with stage 1 through stage 4 chronic kidney disease, or unspecified chronic kidney disease: Secondary | ICD-10-CM | POA: Diagnosis not present

## 2021-06-06 DIAGNOSIS — J449 Chronic obstructive pulmonary disease, unspecified: Secondary | ICD-10-CM | POA: Diagnosis not present

## 2021-06-06 DIAGNOSIS — D509 Iron deficiency anemia, unspecified: Secondary | ICD-10-CM | POA: Diagnosis not present

## 2021-06-06 DIAGNOSIS — J9691 Respiratory failure, unspecified with hypoxia: Secondary | ICD-10-CM | POA: Diagnosis not present

## 2021-06-06 DIAGNOSIS — I129 Hypertensive chronic kidney disease with stage 1 through stage 4 chronic kidney disease, or unspecified chronic kidney disease: Secondary | ICD-10-CM | POA: Diagnosis not present

## 2021-06-06 DIAGNOSIS — F32A Depression, unspecified: Secondary | ICD-10-CM | POA: Diagnosis not present

## 2021-06-06 DIAGNOSIS — F419 Anxiety disorder, unspecified: Secondary | ICD-10-CM | POA: Diagnosis not present

## 2021-06-07 DIAGNOSIS — F419 Anxiety disorder, unspecified: Secondary | ICD-10-CM | POA: Diagnosis not present

## 2021-06-07 DIAGNOSIS — Z87891 Personal history of nicotine dependence: Secondary | ICD-10-CM | POA: Diagnosis not present

## 2021-06-07 DIAGNOSIS — Z7722 Contact with and (suspected) exposure to environmental tobacco smoke (acute) (chronic): Secondary | ICD-10-CM | POA: Diagnosis not present

## 2021-06-07 DIAGNOSIS — E785 Hyperlipidemia, unspecified: Secondary | ICD-10-CM | POA: Diagnosis not present

## 2021-06-07 DIAGNOSIS — K219 Gastro-esophageal reflux disease without esophagitis: Secondary | ICD-10-CM | POA: Diagnosis not present

## 2021-06-07 DIAGNOSIS — J309 Allergic rhinitis, unspecified: Secondary | ICD-10-CM | POA: Diagnosis not present

## 2021-06-07 DIAGNOSIS — F32A Depression, unspecified: Secondary | ICD-10-CM | POA: Diagnosis not present

## 2021-06-07 DIAGNOSIS — E039 Hypothyroidism, unspecified: Secondary | ICD-10-CM | POA: Diagnosis not present

## 2021-06-07 DIAGNOSIS — I129 Hypertensive chronic kidney disease with stage 1 through stage 4 chronic kidney disease, or unspecified chronic kidney disease: Secondary | ICD-10-CM | POA: Diagnosis not present

## 2021-06-07 DIAGNOSIS — R634 Abnormal weight loss: Secondary | ICD-10-CM | POA: Diagnosis not present

## 2021-06-07 DIAGNOSIS — M810 Age-related osteoporosis without current pathological fracture: Secondary | ICD-10-CM | POA: Diagnosis not present

## 2021-06-07 DIAGNOSIS — N1832 Chronic kidney disease, stage 3b: Secondary | ICD-10-CM | POA: Diagnosis not present

## 2021-06-07 DIAGNOSIS — Z9981 Dependence on supplemental oxygen: Secondary | ICD-10-CM | POA: Diagnosis not present

## 2021-06-07 DIAGNOSIS — D509 Iron deficiency anemia, unspecified: Secondary | ICD-10-CM | POA: Diagnosis not present

## 2021-06-07 DIAGNOSIS — J449 Chronic obstructive pulmonary disease, unspecified: Secondary | ICD-10-CM | POA: Diagnosis not present

## 2021-06-07 DIAGNOSIS — Z8701 Personal history of pneumonia (recurrent): Secondary | ICD-10-CM | POA: Diagnosis not present

## 2021-06-07 DIAGNOSIS — J9691 Respiratory failure, unspecified with hypoxia: Secondary | ICD-10-CM | POA: Diagnosis not present

## 2021-06-07 DIAGNOSIS — Z6820 Body mass index (BMI) 20.0-20.9, adult: Secondary | ICD-10-CM | POA: Diagnosis not present

## 2021-06-07 DIAGNOSIS — M419 Scoliosis, unspecified: Secondary | ICD-10-CM | POA: Diagnosis not present

## 2021-06-07 DIAGNOSIS — R627 Adult failure to thrive: Secondary | ICD-10-CM | POA: Diagnosis not present

## 2021-06-11 DIAGNOSIS — J449 Chronic obstructive pulmonary disease, unspecified: Secondary | ICD-10-CM | POA: Diagnosis not present

## 2021-06-11 DIAGNOSIS — F419 Anxiety disorder, unspecified: Secondary | ICD-10-CM | POA: Diagnosis not present

## 2021-06-11 DIAGNOSIS — J9691 Respiratory failure, unspecified with hypoxia: Secondary | ICD-10-CM | POA: Diagnosis not present

## 2021-06-11 DIAGNOSIS — D509 Iron deficiency anemia, unspecified: Secondary | ICD-10-CM | POA: Diagnosis not present

## 2021-06-11 DIAGNOSIS — I129 Hypertensive chronic kidney disease with stage 1 through stage 4 chronic kidney disease, or unspecified chronic kidney disease: Secondary | ICD-10-CM | POA: Diagnosis not present

## 2021-06-11 DIAGNOSIS — F32A Depression, unspecified: Secondary | ICD-10-CM | POA: Diagnosis not present

## 2021-06-12 DIAGNOSIS — J9691 Respiratory failure, unspecified with hypoxia: Secondary | ICD-10-CM | POA: Diagnosis not present

## 2021-06-12 DIAGNOSIS — F419 Anxiety disorder, unspecified: Secondary | ICD-10-CM | POA: Diagnosis not present

## 2021-06-12 DIAGNOSIS — I129 Hypertensive chronic kidney disease with stage 1 through stage 4 chronic kidney disease, or unspecified chronic kidney disease: Secondary | ICD-10-CM | POA: Diagnosis not present

## 2021-06-12 DIAGNOSIS — D509 Iron deficiency anemia, unspecified: Secondary | ICD-10-CM | POA: Diagnosis not present

## 2021-06-12 DIAGNOSIS — F32A Depression, unspecified: Secondary | ICD-10-CM | POA: Diagnosis not present

## 2021-06-12 DIAGNOSIS — J449 Chronic obstructive pulmonary disease, unspecified: Secondary | ICD-10-CM | POA: Diagnosis not present

## 2021-06-19 ENCOUNTER — Telehealth (HOSPITAL_COMMUNITY): Payer: Self-pay

## 2021-06-19 NOTE — Telephone Encounter (Signed)
Daughter called to advise that patient passed away on 07/01/2021.

## 2021-06-19 NOTE — Telephone Encounter (Signed)
Attempted to contact patient's daughter. Left voicemail.

## 2021-06-22 ENCOUNTER — Telehealth: Payer: Medicare Other | Admitting: Psychiatry

## 2021-07-07 DEATH — deceased

## 2022-03-23 IMAGING — CT CT HEAD W/O CM
4 series · 17 of 47 positions shown, 19 images · non-contrast
Comparison: 02/23/2009

CLINICAL DATA: Recent fall with headaches, initial encounter

EXAM:
CT HEAD WITHOUT CONTRAST
TECHNIQUE: Contiguous axial images were obtained from the base of the skull
through the vertex without intravenous contrast.

[Series 2: head wo · axial · 0.42mm/px · z∈[-73,+47]mm · 7 of 34 slices shown, 9 images]
[im 5/34  brain]
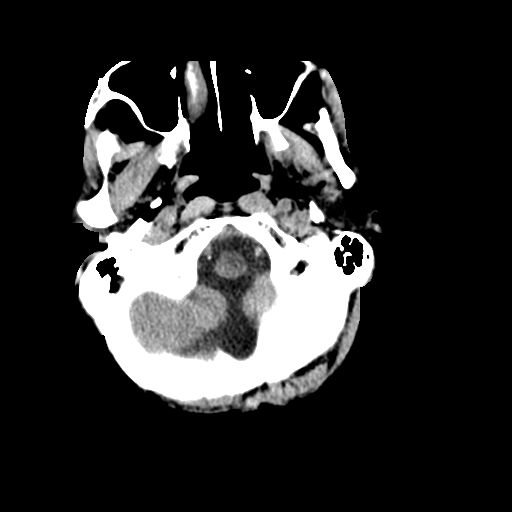
[im 5/34  bone]
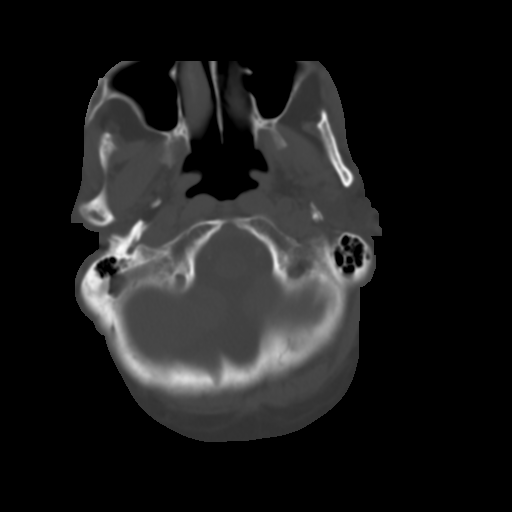
[im 9/34  brain]
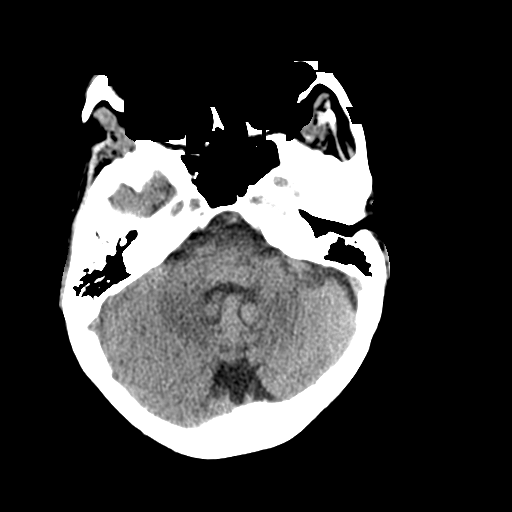
[im 13/34  brain]
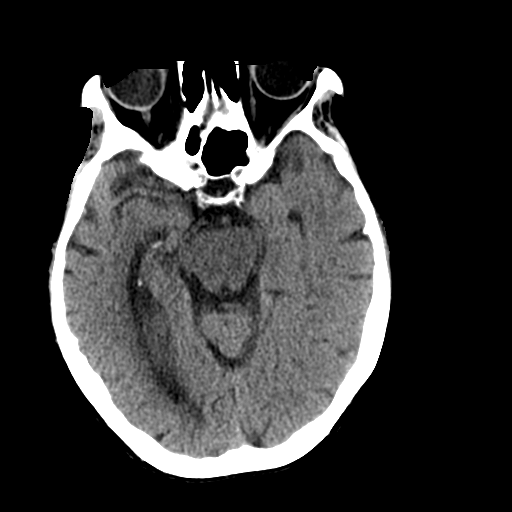
[im 17/34  brain]
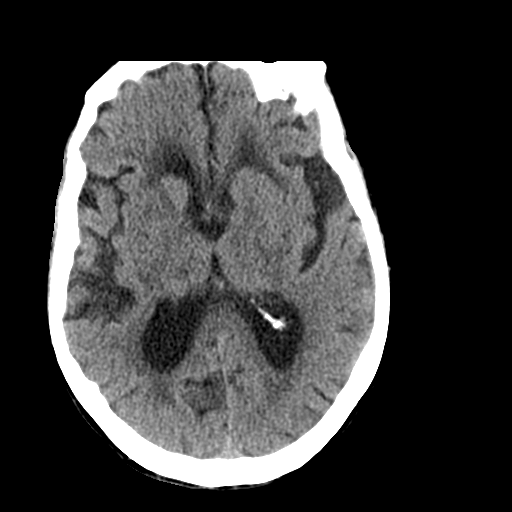
[im 21/34  brain]
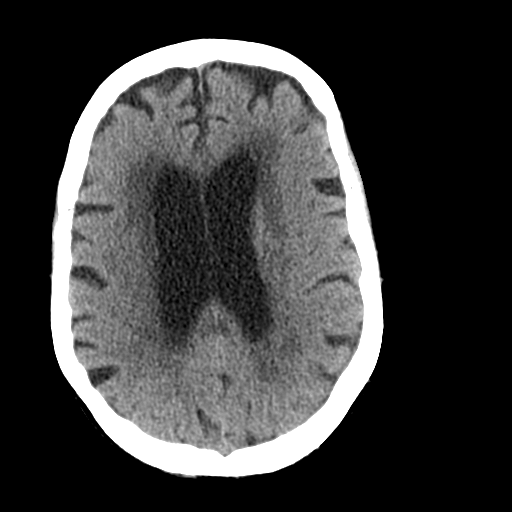
[im 21/34  bone]
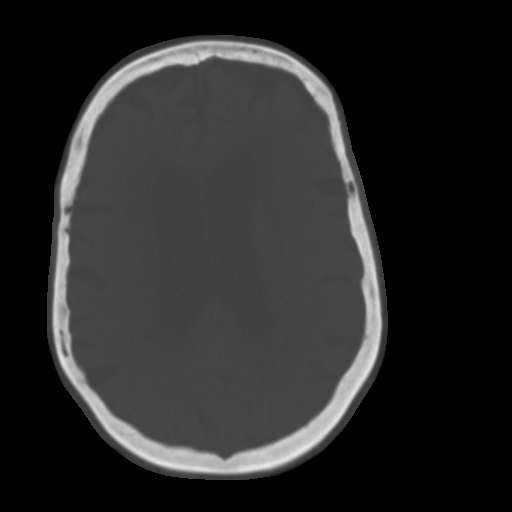
[im 25/34  brain]
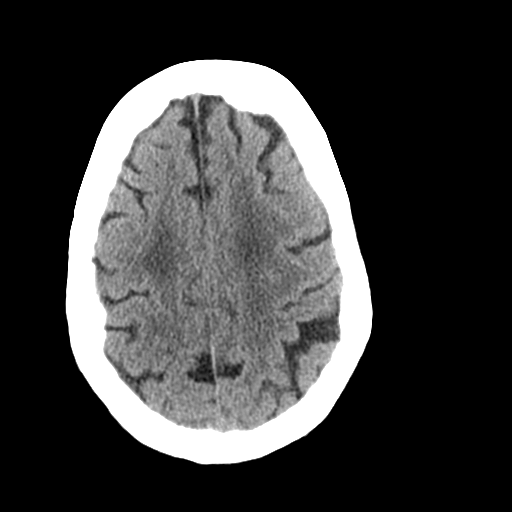
[im 29/34  brain]
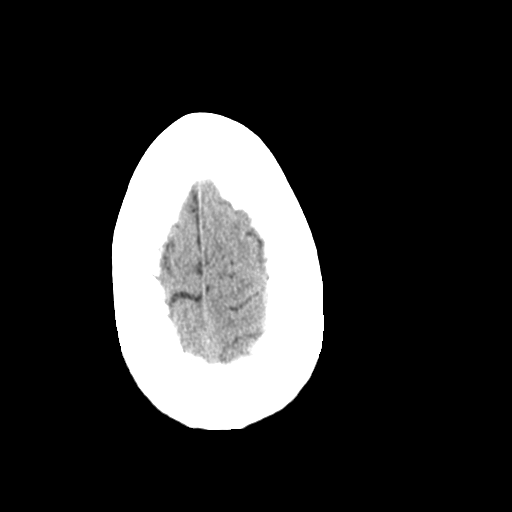

[Series 3: head bone · axial · 0.42mm/px · z∈[-77,-19]mm · 4 of 85 slices shown]
[im 9/85  bone]
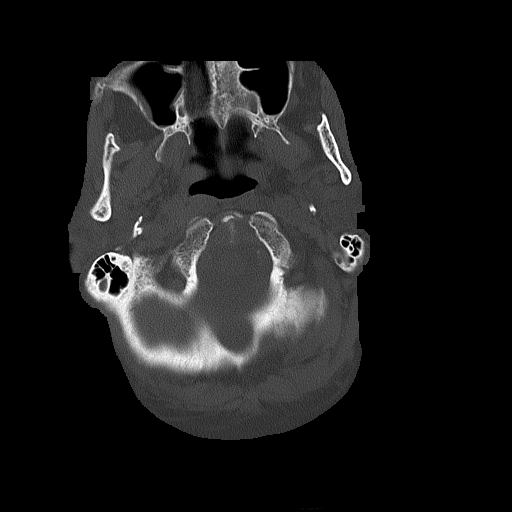
[im 17/85  bone]
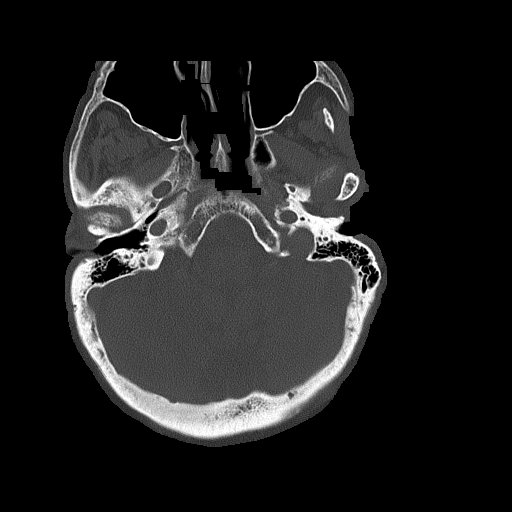
[im 26/85  bone]
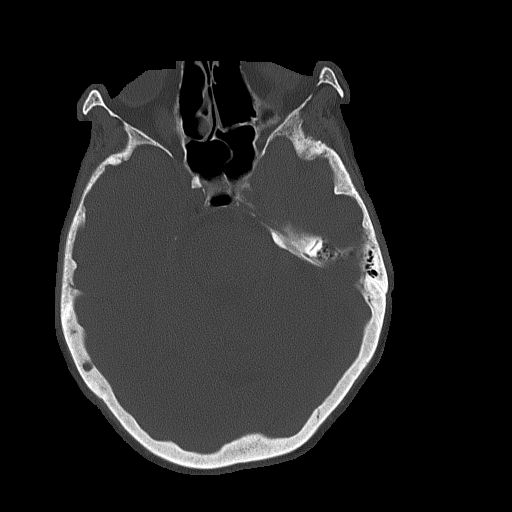
[im 38/85  bone]
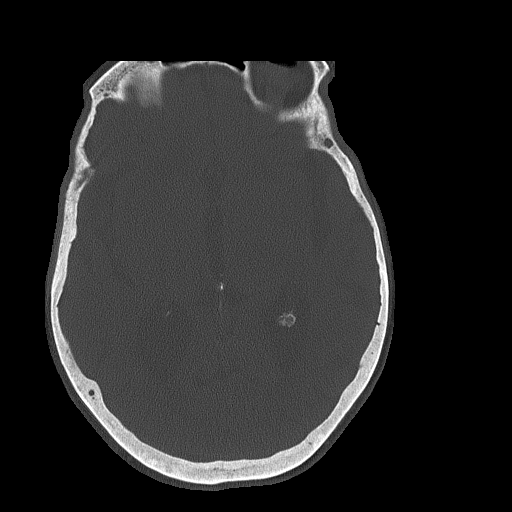

[Series 4: coronal soft tissue · coronal · 0.36mm/px · 3 of 72 slices shown]
[im 24/72  brain]
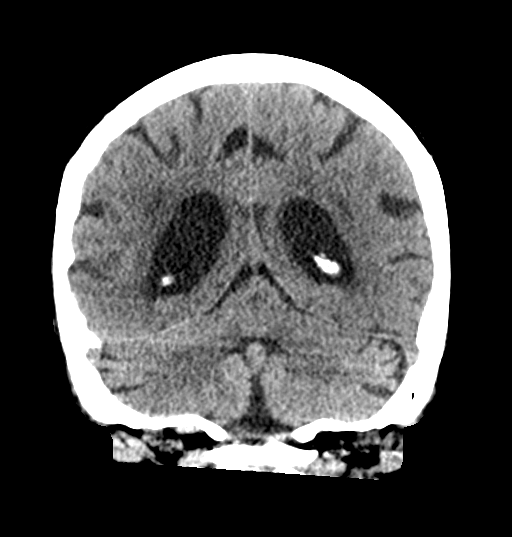
[im 32/72  brain]
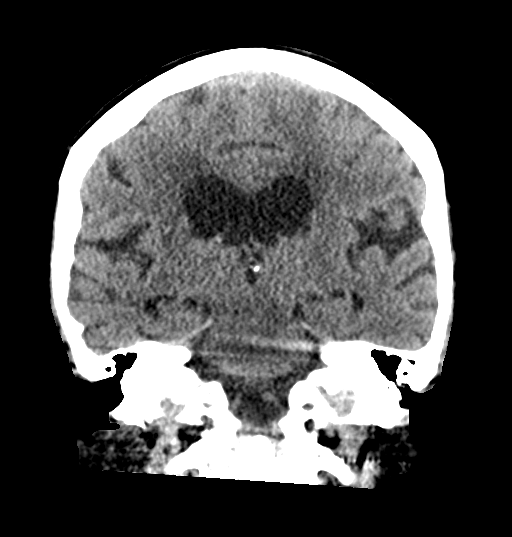
[im 40/72  brain]
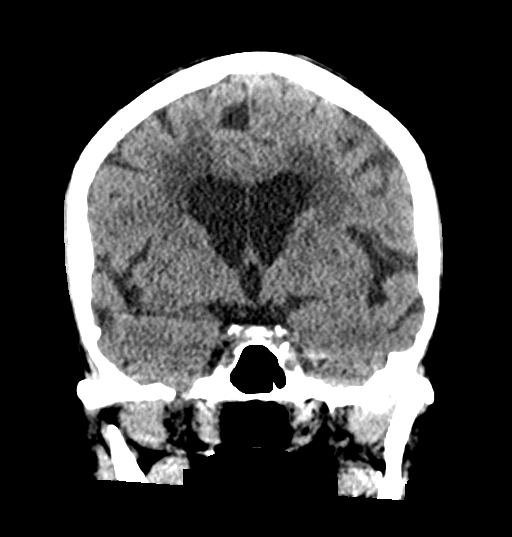

[Series 5: sagittal soft tissue · sagittal · 0.37mm/px · 3 of 58 slices shown]
[im 20/58  brain]
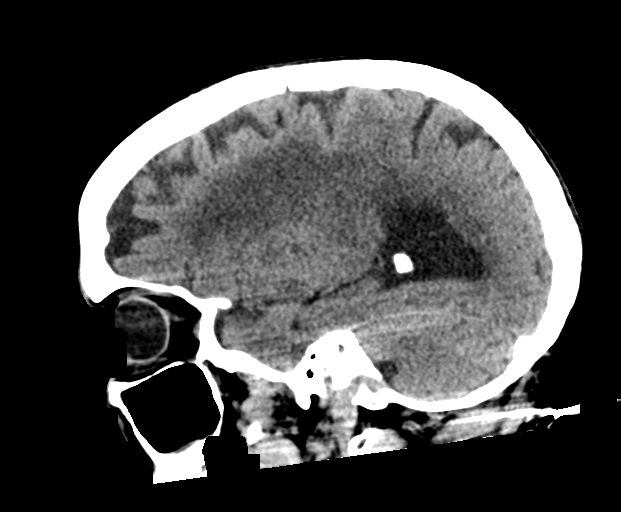
[im 29/58  brain]
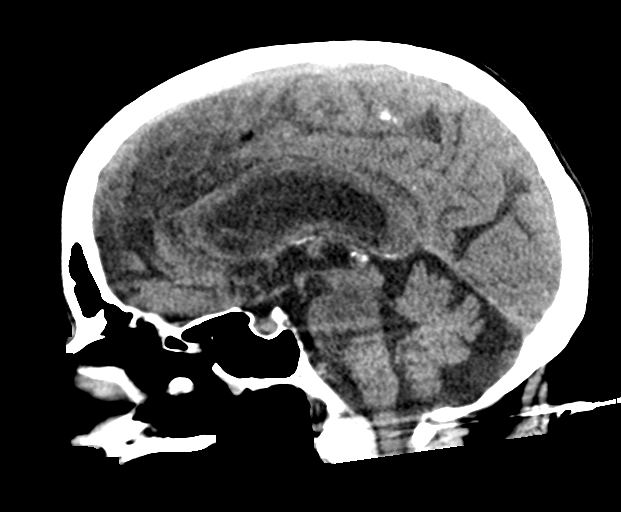
[im 39/58  brain]
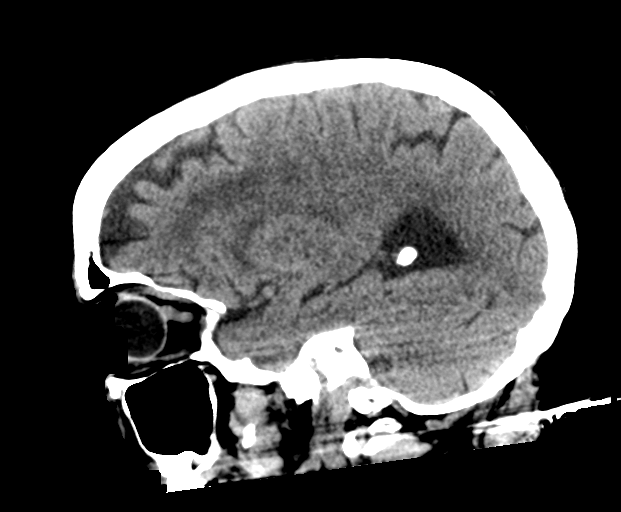

[17 of 47 positions shown; findings below may reference images not displayed]

FINDINGS: Brain: Chronic atrophic and ischemic changes are noted similar to
that seen on the prior exam. No acute hemorrhage, acute infarction
or space-occupying mass lesion is seen.

Vascular: No hyperdense vessel or unexpected calcification.

Skull: Normal. Negative for fracture or focal lesion.

Sinuses/Orbits: No acute finding.

Other: None.
IMPRESSION: Chronic atrophic and ischemic changes without acute abnormality.

## 2022-05-03 IMAGING — CR DG CHEST 2V
1 series · 3 of 3 positions shown · non-contrast
Comparison: 04/11/2019

CLINICAL DATA: [AGE] female with cough and congestion

EXAM:
CHEST - 2 VIEW

[Series 1: left lateral · 0.14mm/px · 3 of 3 slices shown]
[im 1/3]
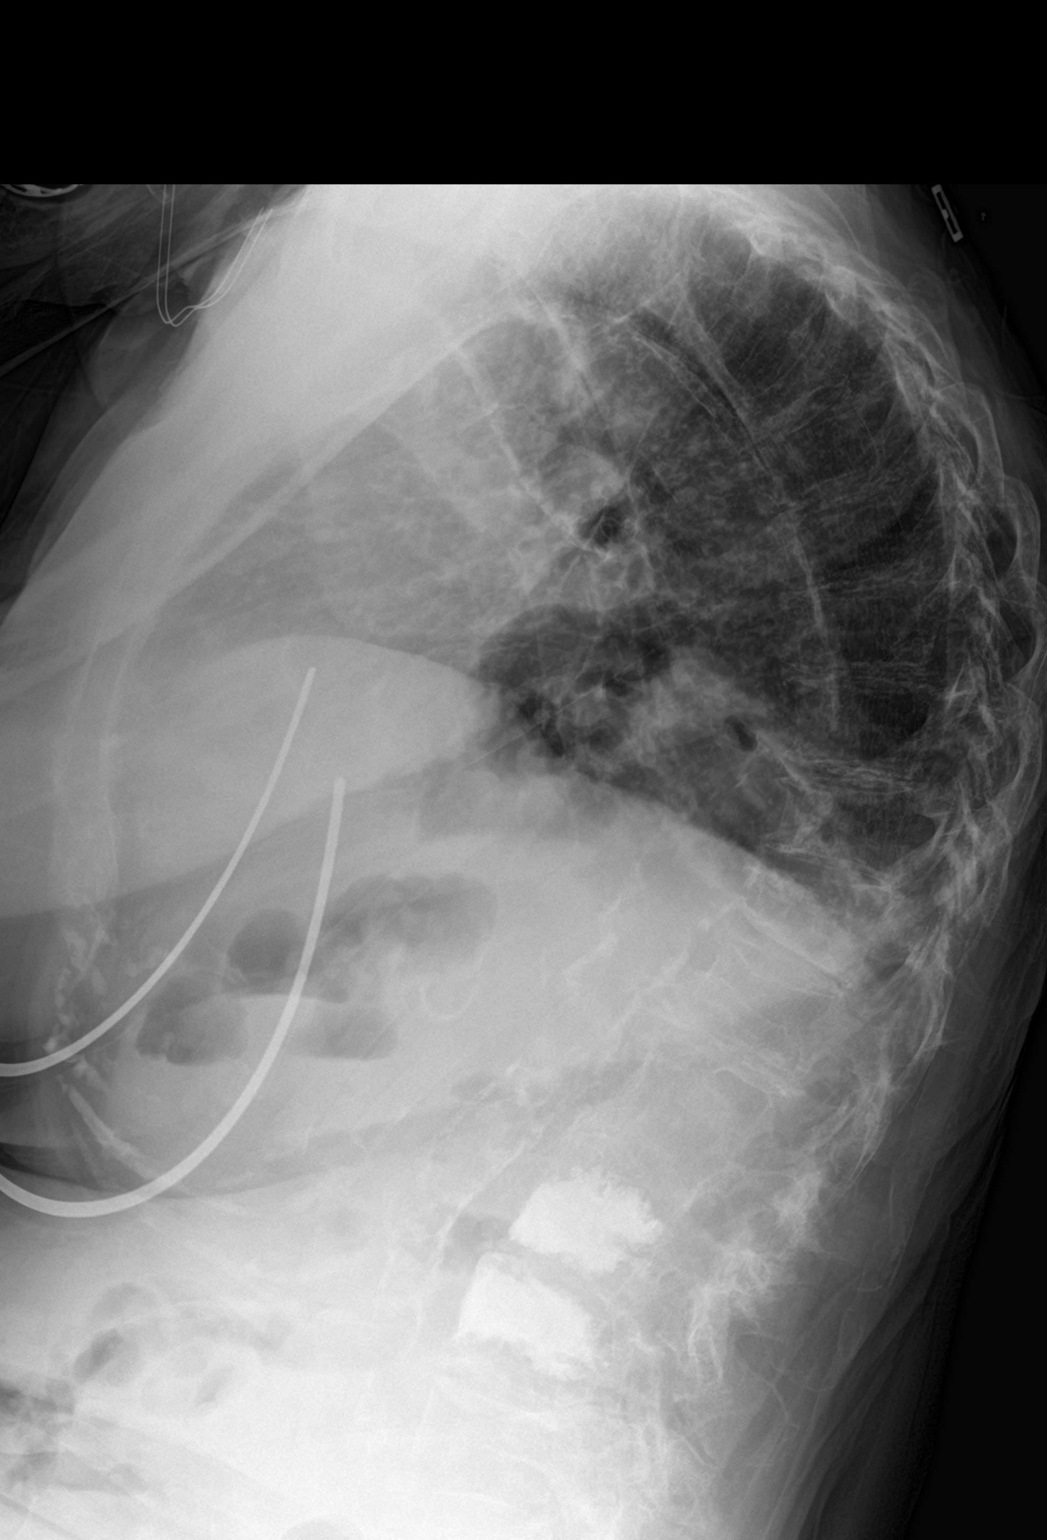
[im 2/3]
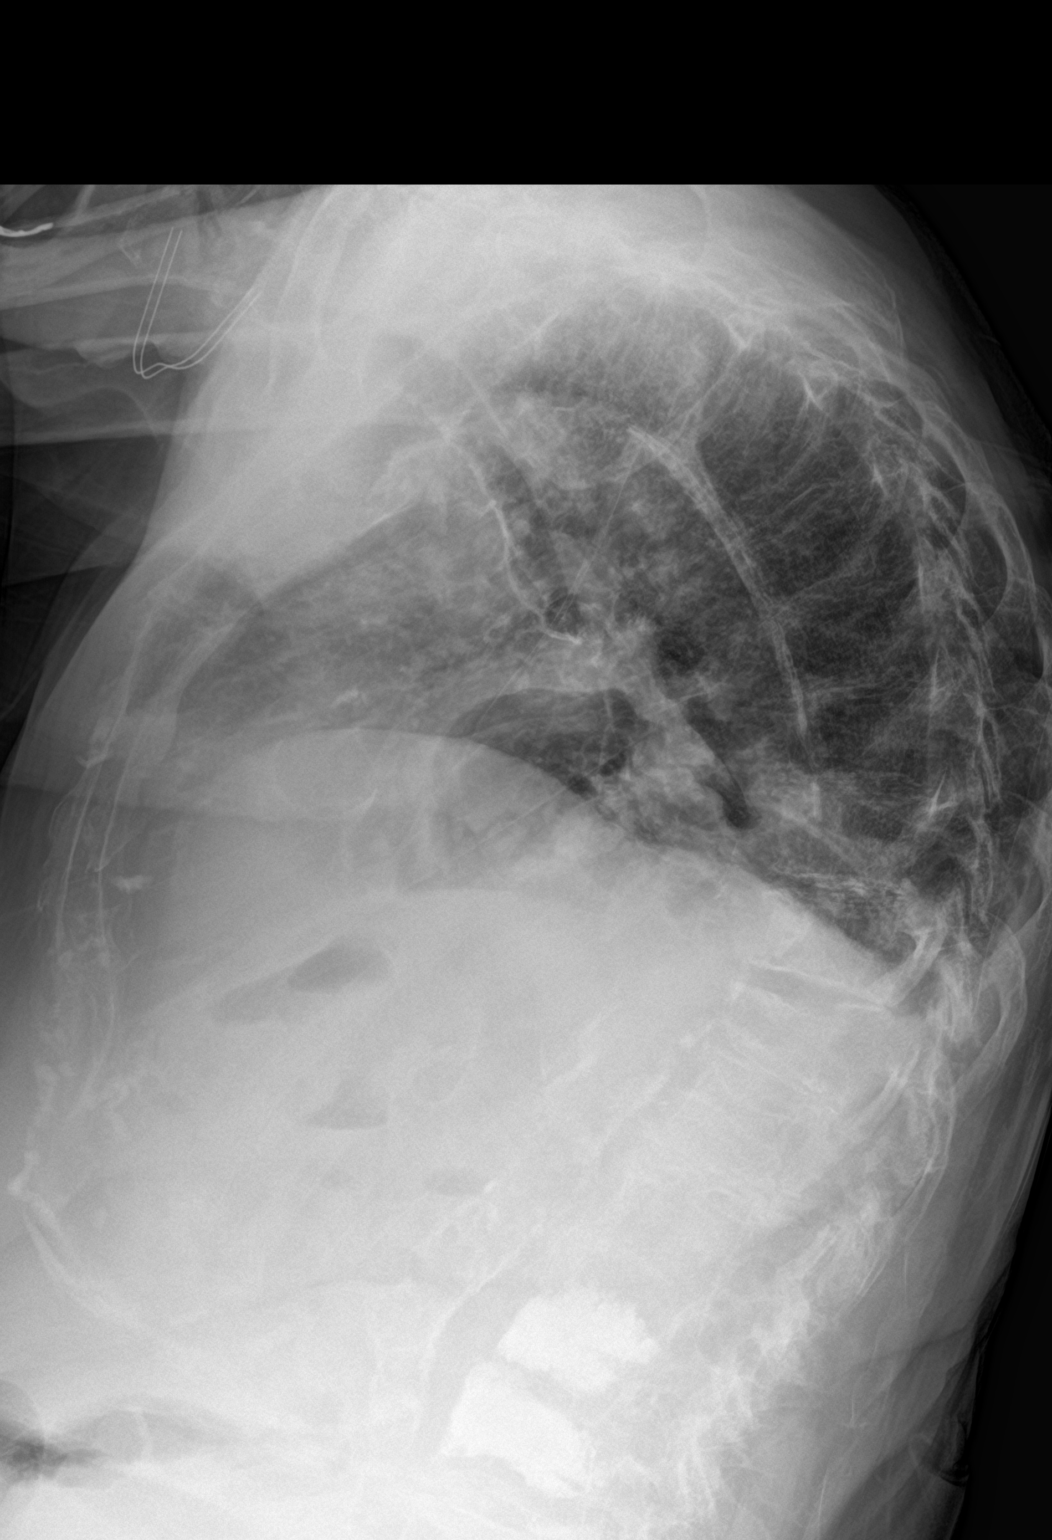
[im 3/3]
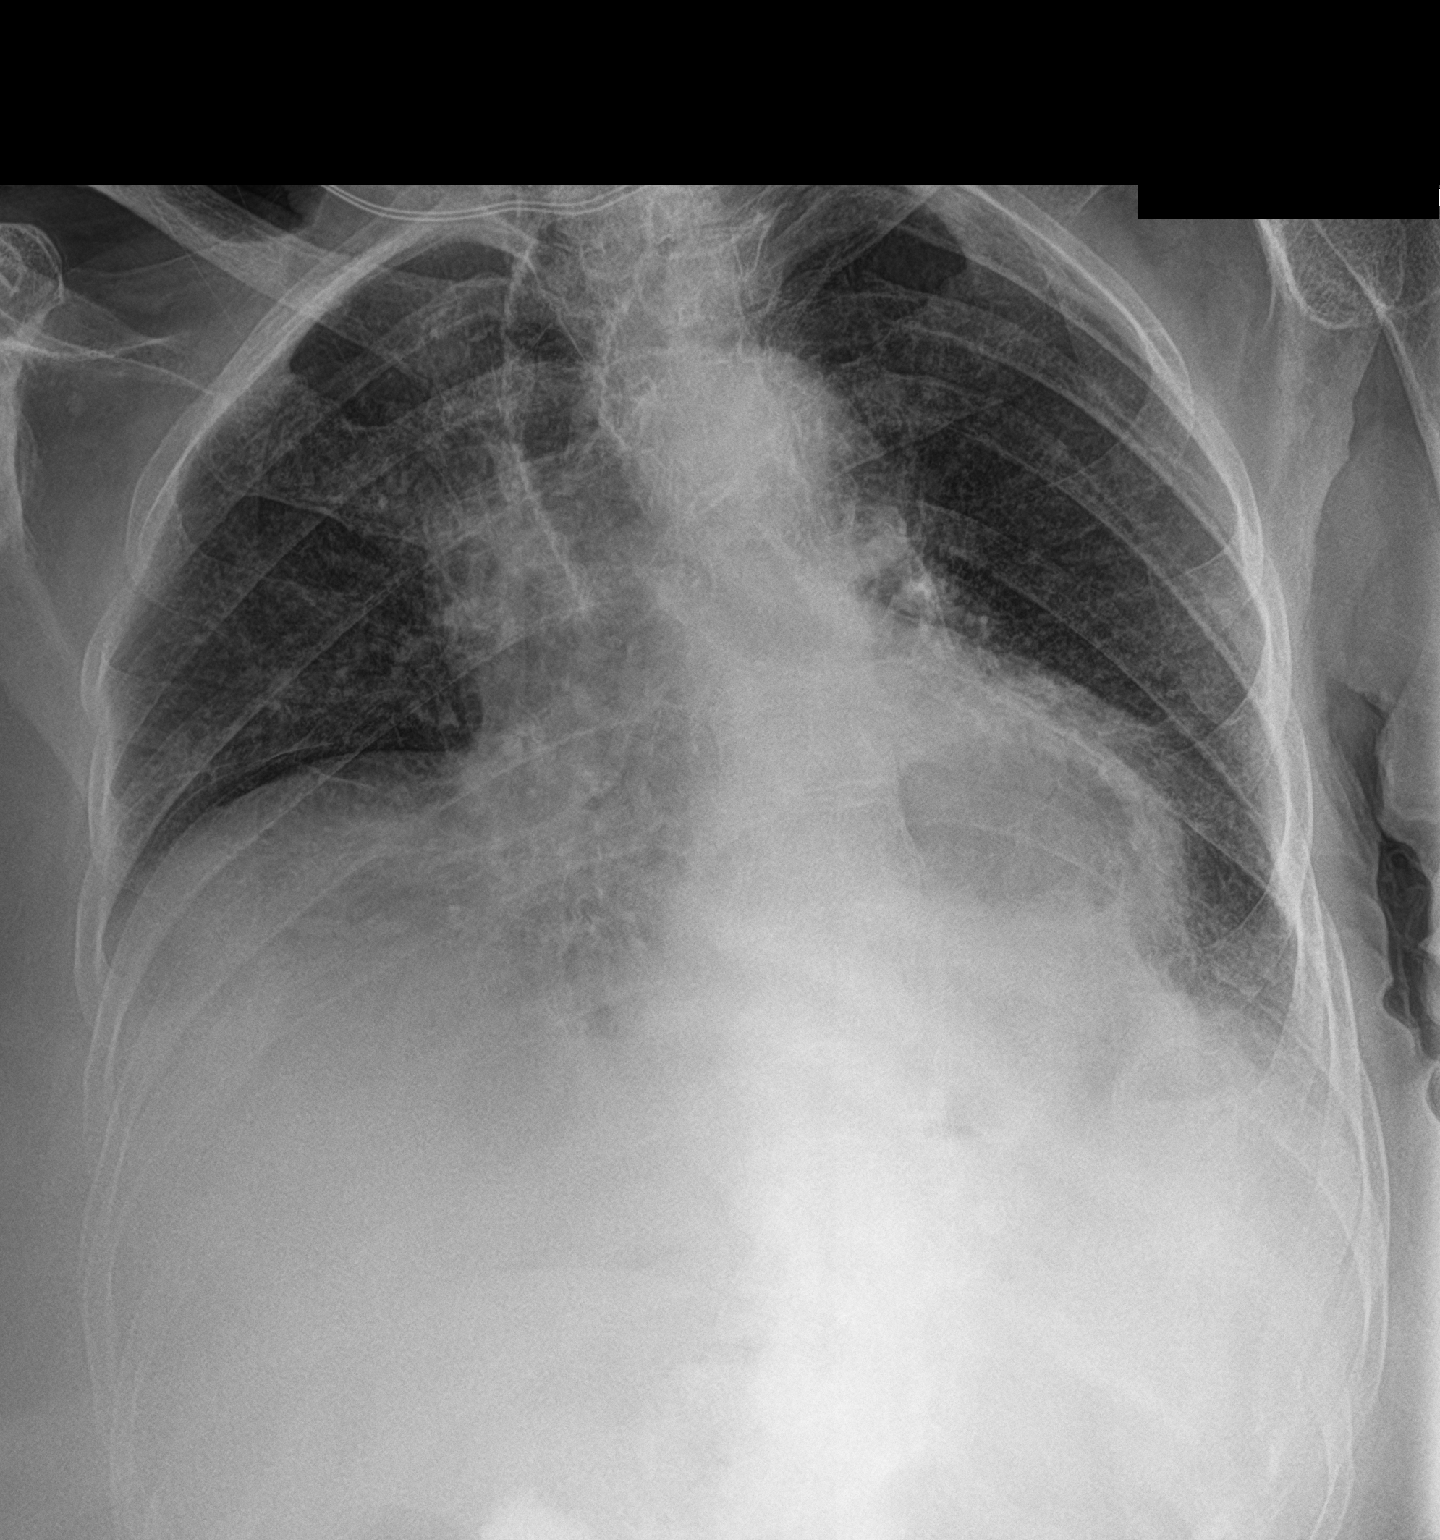

[3 of 3 positions shown; findings below may reference images not displayed]

FINDINGS: Cardiomediastinal silhouette unchanged with double density overlying
the lower mediastinum.

Low lung volumes persist with kyphotic deformity and asymmetric
elevation of the right hemidiaphragm.

No pneumothorax or pleural effusion. No new confluent airspace
disease. Coarsened interstitial markings bilaterally. No evidence of
interlobular septal thickening.

Osteopenia. No acute displaced fracture. Similar configuration of
the visualized vertebral bodies.
IMPRESSION: Chronic lung changes, without evidence of acute cardiopulmonary
disease.

Hiatal hernia.

## 2022-05-21 IMAGING — DX DG CHEST 1V PORT
1 series · 1 of 1 positions shown · non-contrast
Comparison: Portable exam 1311 hours compared to 05/12/2020

CLINICAL DATA: Shortness of breath

EXAM:
PORTABLE CHEST 1 VIEW

[chest ap]
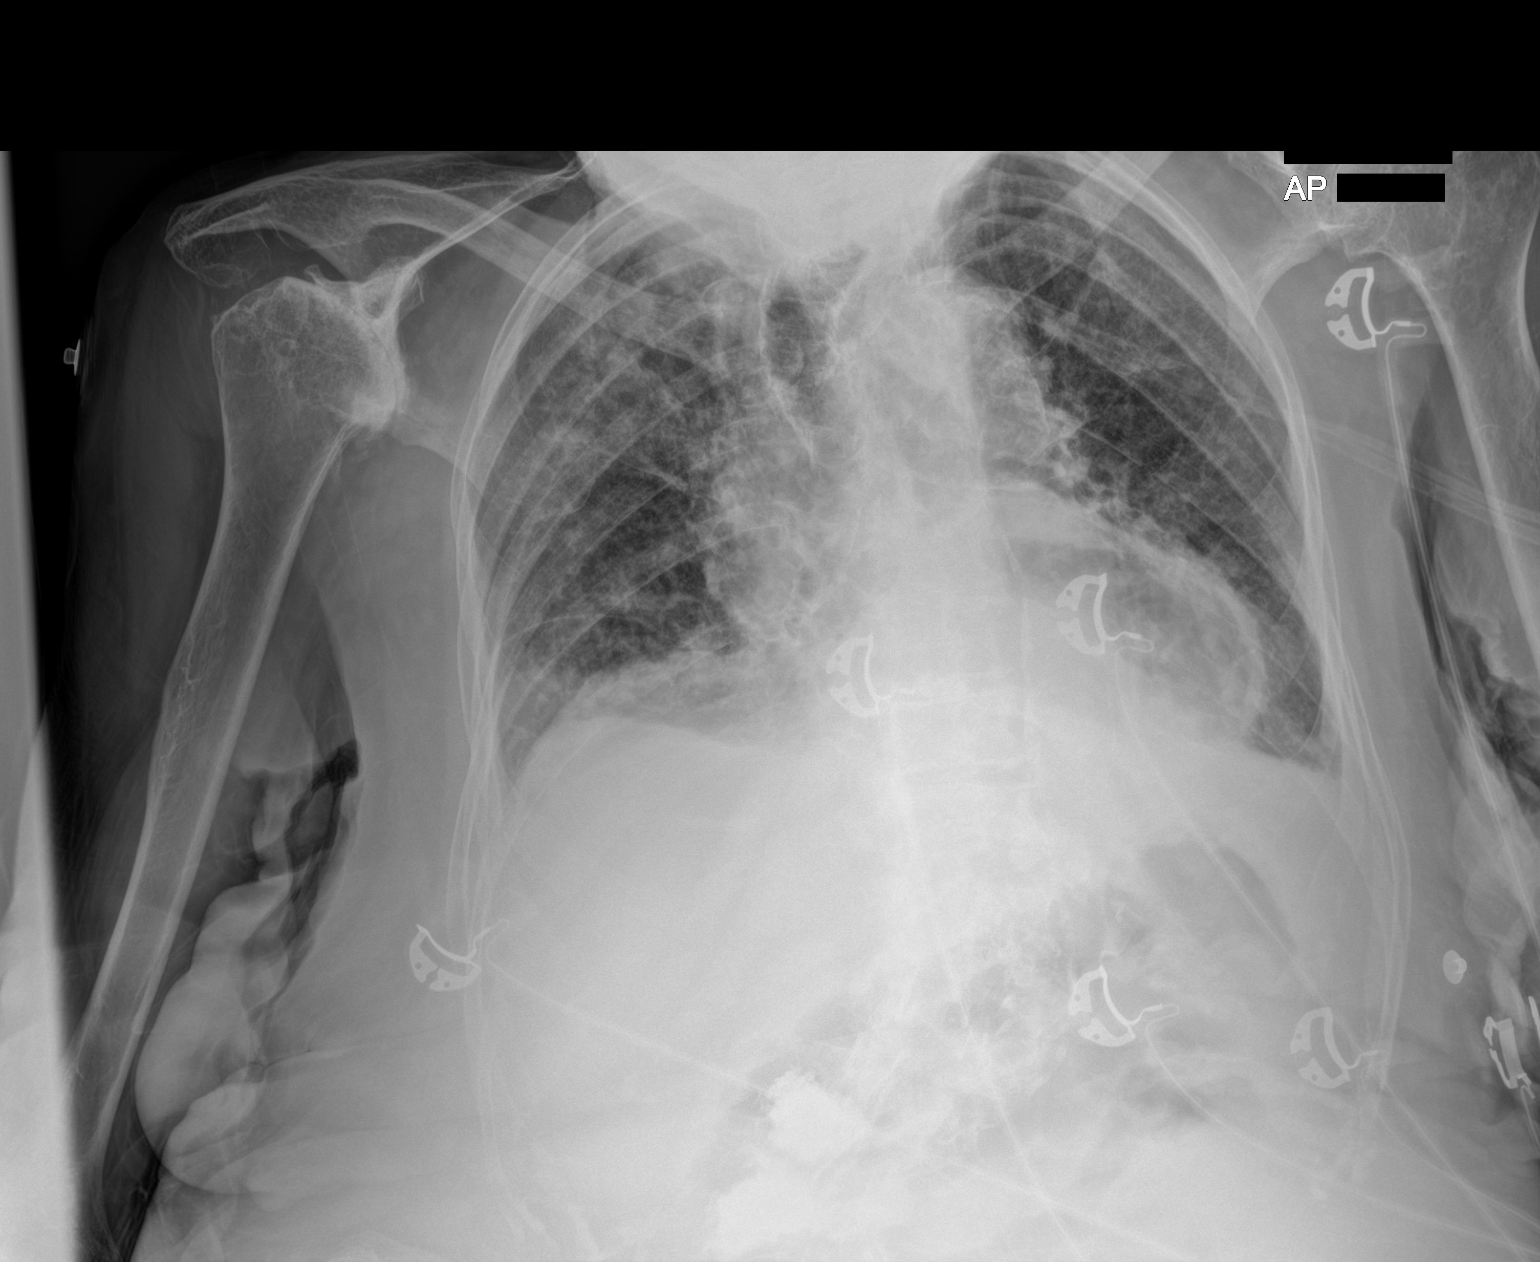

[1 of 1 positions shown; findings below may reference images not displayed]

FINDINGS: Upper normal heart size.

Atherosclerotic calcification aorta.

Moderate-sized hiatal hernia.

Infiltrates identified in RIGHT upper lobe question pneumonia.

Bibasilar atelectasis and probable tiny pleural effusions.

No pneumothorax.

Bones demineralized with advanced RIGHT glenohumeral degenerative
changes, thoracolumbar scoliosis, and prior lumbar spinal
augmentation procedures.
IMPRESSION: Bibasilar atelectasis and probable tiny pleural effusions.

RIGHT upper lobe infiltrate question pneumonia.

Moderate-sized hiatal hernia.

## 2022-09-19 IMAGING — CR DG ABDOMEN 1V
1 series · 1 of 1 positions shown · non-contrast
Comparison: None.

CLINICAL DATA: Constipation.

EXAM:
[id number] VIEW

[dg abd 1 view]
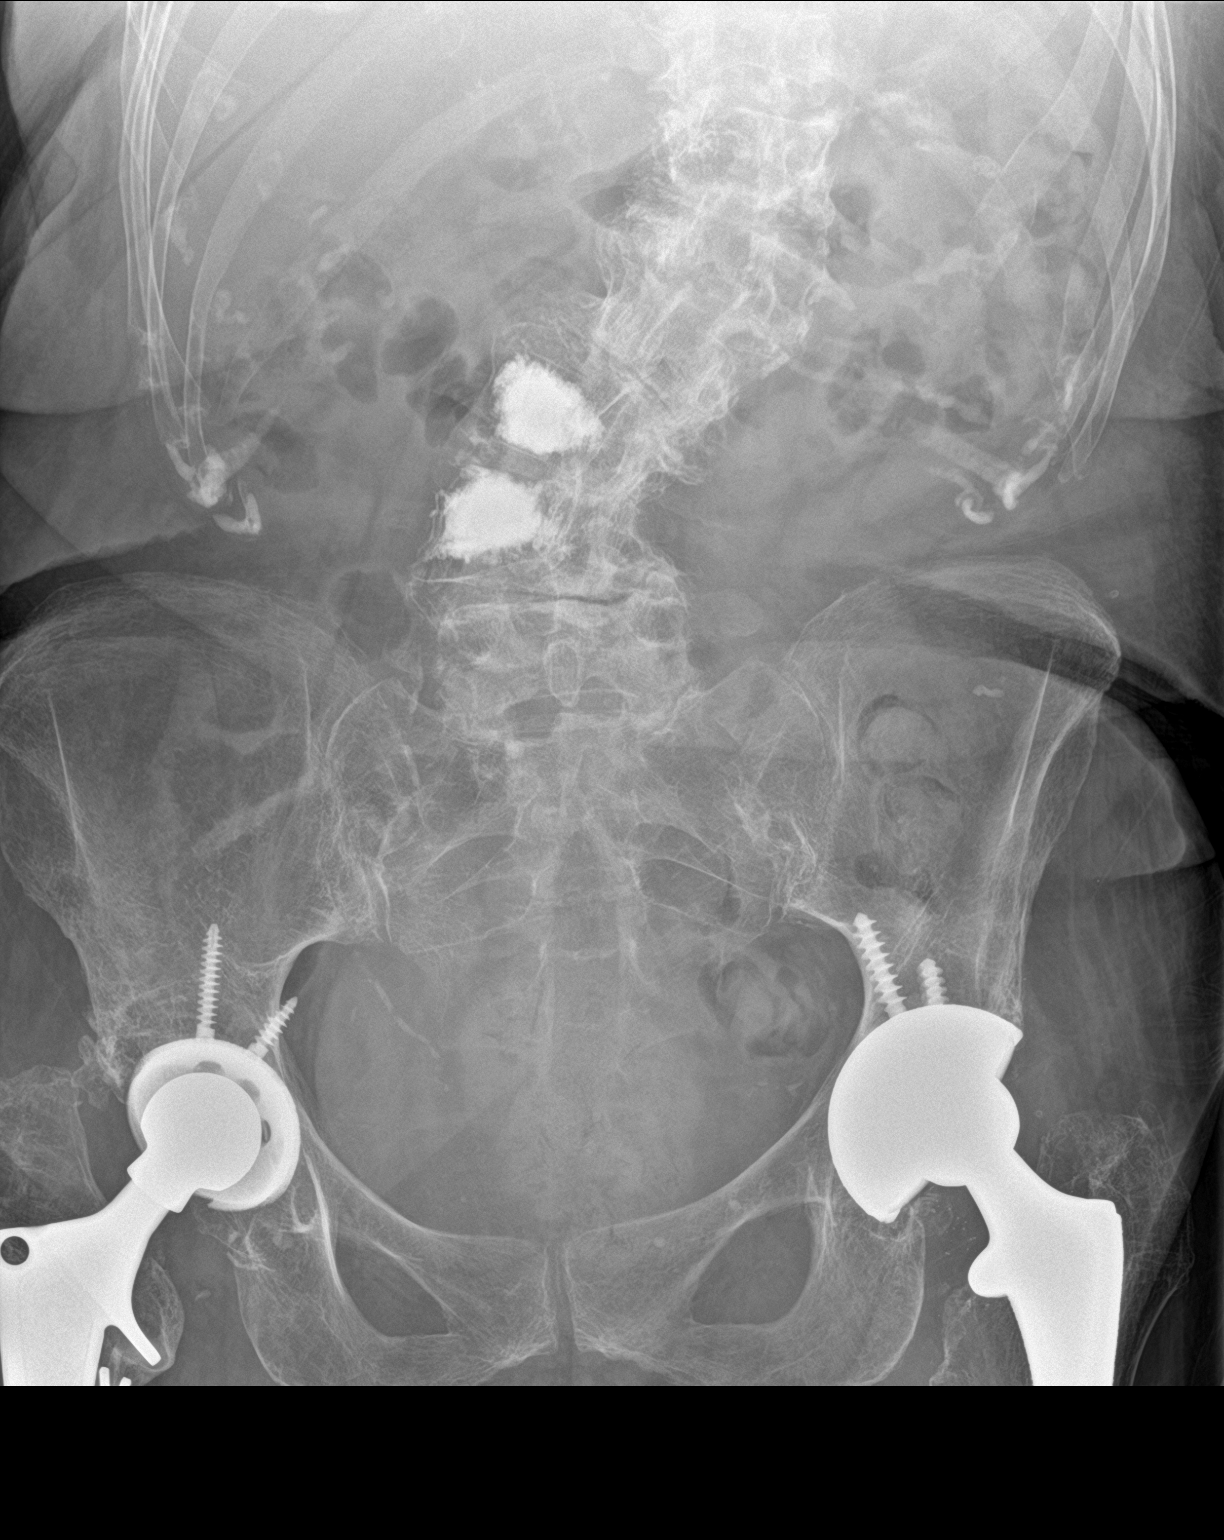

[1 of 1 positions shown; findings below may reference images not displayed]

FINDINGS: The bowel gas pattern is normal. No excessive stool burden. No
radio-opaque calculi or other significant radiographic abnormality
are seen. No acute osseous abnormality.
IMPRESSION: 1. Negative.
